# Patient Record
Sex: Male | Born: 1945 | Race: White | Hispanic: No | Smoking: Former smoker
Health system: Southern US, Community
[De-identification: ages and names within clinical notes are randomized; demographics above are authoritative.]

## PROBLEM LIST (undated history)

## (undated) ENCOUNTER — Emergency Department (HOSPITAL_COMMUNITY): Disposition: A | Discharge status: Home / Self Care | Payer: Medicaid Other | Source: Home / Self Care

## (undated) DIAGNOSIS — K7290 Hepatic failure, unspecified without coma: Secondary | ICD-10-CM

## (undated) DIAGNOSIS — I82A11 Acute embolism and thrombosis of right axillary vein: Secondary | ICD-10-CM

## (undated) DIAGNOSIS — I10 Essential (primary) hypertension: Principal | ICD-10-CM

## (undated) DIAGNOSIS — S2242XA Multiple fractures of ribs, left side, initial encounter for closed fracture: Secondary | ICD-10-CM

## (undated) DIAGNOSIS — H8110 Benign paroxysmal vertigo, unspecified ear: Secondary | ICD-10-CM

## (undated) DIAGNOSIS — I441 Atrioventricular block, second degree: Secondary | ICD-10-CM

## (undated) DIAGNOSIS — J449 Chronic obstructive pulmonary disease, unspecified: Secondary | ICD-10-CM

## (undated) DIAGNOSIS — S0292XA Unspecified fracture of facial bones, initial encounter for closed fracture: Secondary | ICD-10-CM

## (undated) DIAGNOSIS — M948X9 Other specified disorders of cartilage, unspecified sites: Secondary | ICD-10-CM

## (undated) DIAGNOSIS — I639 Cerebral infarction, unspecified: Secondary | ICD-10-CM

## (undated) DIAGNOSIS — F0391 Unspecified dementia with behavioral disturbance: Secondary | ICD-10-CM

## (undated) DIAGNOSIS — E1165 Type 2 diabetes mellitus with hyperglycemia: Secondary | ICD-10-CM

## (undated) DIAGNOSIS — Z9181 History of falling: Secondary | ICD-10-CM

## (undated) DIAGNOSIS — R42 Dizziness and giddiness: Secondary | ICD-10-CM

## (undated) DIAGNOSIS — J90 Pleural effusion, not elsewhere classified: Secondary | ICD-10-CM

## (undated) DIAGNOSIS — E785 Hyperlipidemia, unspecified: Secondary | ICD-10-CM

## (undated) DIAGNOSIS — F1011 Alcohol abuse, in remission: Secondary | ICD-10-CM

## (undated) DIAGNOSIS — M545 Low back pain, unspecified: Secondary | ICD-10-CM

## (undated) DIAGNOSIS — R3915 Urgency of urination: Secondary | ICD-10-CM

## (undated) DIAGNOSIS — D696 Thrombocytopenia, unspecified: Secondary | ICD-10-CM

## (undated) DIAGNOSIS — I219 Acute myocardial infarction, unspecified: Secondary | ICD-10-CM

## (undated) DIAGNOSIS — J9601 Acute respiratory failure with hypoxia: Secondary | ICD-10-CM

## (undated) DIAGNOSIS — E872 Acidosis: Secondary | ICD-10-CM

## (undated) DIAGNOSIS — R531 Weakness: Secondary | ICD-10-CM

## (undated) DIAGNOSIS — J189 Pneumonia, unspecified organism: Secondary | ICD-10-CM

## (undated) DIAGNOSIS — Z930 Tracheostomy status: Secondary | ICD-10-CM

## (undated) DIAGNOSIS — E118 Type 2 diabetes mellitus with unspecified complications: Secondary | ICD-10-CM

## (undated) DIAGNOSIS — I469 Cardiac arrest, cause unspecified: Secondary | ICD-10-CM

## (undated) DIAGNOSIS — I4510 Unspecified right bundle-branch block: Secondary | ICD-10-CM

## (undated) DIAGNOSIS — S2249XA Multiple fractures of ribs, unspecified side, initial encounter for closed fracture: Secondary | ICD-10-CM

## (undated) DIAGNOSIS — N189 Chronic kidney disease, unspecified: Secondary | ICD-10-CM

## (undated) DIAGNOSIS — D72829 Elevated white blood cell count, unspecified: Secondary | ICD-10-CM

## (undated) DIAGNOSIS — W19XXXA Unspecified fall, initial encounter: Secondary | ICD-10-CM

## (undated) DIAGNOSIS — J209 Acute bronchitis, unspecified: Secondary | ICD-10-CM

## (undated) DIAGNOSIS — E039 Hypothyroidism, unspecified: Secondary | ICD-10-CM

## (undated) DIAGNOSIS — C801 Malignant (primary) neoplasm, unspecified: Secondary | ICD-10-CM

## (undated) DIAGNOSIS — R1310 Dysphagia, unspecified: Secondary | ICD-10-CM

## (undated) DIAGNOSIS — S069X9A Unspecified intracranial injury with loss of consciousness of unspecified duration, initial encounter: Secondary | ICD-10-CM

## (undated) HISTORY — DX: Multiple fractures of ribs, left side, initial encounter for closed fracture: S22.42XA

## (undated) HISTORY — DX: Cardiac arrest, cause unspecified: I46.9

## (undated) HISTORY — DX: Alcohol abuse, in remission: F10.11

## (undated) HISTORY — DX: Low back pain, unspecified: M54.50

## (undated) HISTORY — DX: Elevated white blood cell count, unspecified: D72.829

## (undated) HISTORY — DX: Type 2 diabetes mellitus with hyperglycemia: E11.65

## (undated) HISTORY — DX: Benign paroxysmal vertigo, unspecified ear: H81.10

## (undated) HISTORY — DX: Multiple fractures of ribs, unspecified side, initial encounter for closed fracture: S22.49XA

## (undated) HISTORY — DX: Chronic obstructive pulmonary disease, unspecified: J44.9

## (undated) HISTORY — DX: Pleural effusion, not elsewhere classified: J90

## (undated) HISTORY — DX: Tracheostomy status: Z93.0

## (undated) HISTORY — DX: Unspecified dementia with behavioral disturbance: F03.91

## (undated) HISTORY — DX: Hepatic failure, unspecified without coma: K72.90

## (undated) HISTORY — DX: Acute embolism and thrombosis of right axillary vein: I82.A11

## (undated) HISTORY — DX: Acute bronchitis, unspecified: J20.9

## (undated) HISTORY — DX: Low back pain: M54.5

## (undated) HISTORY — DX: Unspecified right bundle-branch block: I45.10

## (undated) HISTORY — DX: Acidosis: E87.2

## (undated) HISTORY — DX: Acute respiratory failure with hypoxia: J96.01

## (undated) HISTORY — DX: Atrioventricular block, second degree: I44.1

## (undated) HISTORY — DX: Dizziness and giddiness: R42

## (undated) HISTORY — DX: Type 2 diabetes mellitus with unspecified complications: E11.8

## (undated) HISTORY — DX: Dysphagia, unspecified: R13.10

## (undated) HISTORY — DX: Essential (primary) hypertension: I10

## (undated) HISTORY — DX: Cerebral infarction, unspecified: I63.9

## (undated) HISTORY — DX: Hyperlipidemia, unspecified: E78.5

## (undated) HISTORY — DX: Other specified disorders of cartilage, unspecified sites: M94.8X9

## (undated) HISTORY — DX: Thrombocytopenia, unspecified: D69.6

## (undated) HISTORY — DX: Unspecified fall, initial encounter: W19.XXXA

## (undated) HISTORY — PX: CATARACT EXTRACTION, BILATERAL: SHX1313

---

## 1999-08-12 ENCOUNTER — Encounter: Payer: Self-pay | Admitting: Emergency Medicine

## 1999-08-12 ENCOUNTER — Emergency Department (HOSPITAL_COMMUNITY): Admission: EM | Admit: 1999-08-12 | Discharge: 1999-08-12 | Payer: Self-pay | Admitting: Emergency Medicine

## 1999-11-29 ENCOUNTER — Encounter: Admission: RE | Admit: 1999-11-29 | Discharge: 1999-11-29 | Payer: Self-pay | Admitting: Internal Medicine

## 1999-11-29 ENCOUNTER — Ambulatory Visit (HOSPITAL_COMMUNITY): Admission: RE | Admit: 1999-11-29 | Discharge: 1999-11-29 | Payer: Self-pay | Admitting: Internal Medicine

## 2003-10-03 DIAGNOSIS — I639 Cerebral infarction, unspecified: Secondary | ICD-10-CM

## 2003-10-03 HISTORY — DX: Cerebral infarction, unspecified: I63.9

## 2004-01-16 ENCOUNTER — Inpatient Hospital Stay (HOSPITAL_COMMUNITY): Admission: EM | Admit: 2004-01-16 | Discharge: 2004-01-21 | Payer: Self-pay

## 2004-02-01 ENCOUNTER — Encounter: Admission: RE | Admit: 2004-02-01 | Discharge: 2004-02-01 | Payer: Self-pay | Admitting: Family Medicine

## 2004-04-18 ENCOUNTER — Emergency Department (HOSPITAL_COMMUNITY): Admission: EM | Admit: 2004-04-18 | Discharge: 2004-04-18 | Payer: Self-pay | Admitting: Emergency Medicine

## 2004-09-05 ENCOUNTER — Ambulatory Visit: Payer: Self-pay | Admitting: Internal Medicine

## 2005-02-17 ENCOUNTER — Ambulatory Visit: Payer: Self-pay | Admitting: Family Medicine

## 2005-03-22 ENCOUNTER — Ambulatory Visit: Payer: Self-pay | Admitting: Family Medicine

## 2005-05-15 ENCOUNTER — Ambulatory Visit: Payer: Self-pay | Admitting: Family Medicine

## 2005-07-25 ENCOUNTER — Ambulatory Visit: Payer: Self-pay | Admitting: Family Medicine

## 2005-09-04 ENCOUNTER — Ambulatory Visit: Payer: Self-pay | Admitting: Sports Medicine

## 2005-09-05 ENCOUNTER — Encounter: Admission: RE | Admit: 2005-09-05 | Discharge: 2005-09-05 | Payer: Self-pay | Admitting: Sports Medicine

## 2005-11-03 ENCOUNTER — Ambulatory Visit: Payer: Self-pay | Admitting: Family Medicine

## 2005-12-19 ENCOUNTER — Ambulatory Visit: Payer: Self-pay | Admitting: Family Medicine

## 2005-12-27 ENCOUNTER — Ambulatory Visit: Payer: Self-pay | Admitting: Family Medicine

## 2005-12-29 ENCOUNTER — Encounter: Admission: RE | Admit: 2005-12-29 | Discharge: 2005-12-29 | Payer: Self-pay | Admitting: Family Medicine

## 2006-01-02 ENCOUNTER — Encounter: Admission: RE | Admit: 2006-01-02 | Discharge: 2006-04-02 | Payer: Self-pay | Admitting: Family Medicine

## 2006-01-02 HISTORY — PX: ELECTROCARDIOGRAM: SHX264

## 2006-01-17 ENCOUNTER — Ambulatory Visit: Payer: Self-pay | Admitting: Sports Medicine

## 2006-01-30 HISTORY — PX: OTHER SURGICAL HISTORY: SHX169

## 2006-02-14 ENCOUNTER — Ambulatory Visit: Payer: Self-pay | Admitting: Family Medicine

## 2006-03-26 ENCOUNTER — Emergency Department (HOSPITAL_COMMUNITY): Admission: EM | Admit: 2006-03-26 | Discharge: 2006-03-27 | Payer: Self-pay | Admitting: Emergency Medicine

## 2006-04-16 ENCOUNTER — Encounter: Admission: RE | Admit: 2006-04-16 | Discharge: 2006-04-16 | Payer: Self-pay | Admitting: Sports Medicine

## 2006-05-03 ENCOUNTER — Ambulatory Visit: Payer: Self-pay | Admitting: Family Medicine

## 2006-06-01 ENCOUNTER — Ambulatory Visit: Payer: Self-pay | Admitting: Family Medicine

## 2006-06-21 ENCOUNTER — Ambulatory Visit: Payer: Self-pay | Admitting: Family Medicine

## 2006-06-26 ENCOUNTER — Ambulatory Visit: Payer: Self-pay | Admitting: Family Medicine

## 2006-06-26 HISTORY — PX: OTHER SURGICAL HISTORY: SHX169

## 2006-07-17 ENCOUNTER — Ambulatory Visit: Payer: Self-pay | Admitting: Family Medicine

## 2006-08-15 ENCOUNTER — Ambulatory Visit: Payer: Self-pay | Admitting: Family Medicine

## 2006-10-08 ENCOUNTER — Ambulatory Visit: Payer: Self-pay | Admitting: Family Medicine

## 2006-10-08 ENCOUNTER — Encounter (INDEPENDENT_AMBULATORY_CARE_PROVIDER_SITE_OTHER): Payer: Self-pay | Admitting: Family Medicine

## 2006-10-08 LAB — CONVERTED CEMR LAB
Cholesterol: 136 mg/dL (ref 0–200)
Total CHOL/HDL Ratio: 2.3
Triglycerides: 83 mg/dL (ref <150)
VLDL: 17 mg/dL (ref 0–40)

## 2006-10-11 ENCOUNTER — Encounter: Admission: RE | Admit: 2006-10-11 | Discharge: 2006-10-11 | Payer: Self-pay | Admitting: Sports Medicine

## 2006-10-26 ENCOUNTER — Encounter: Admission: RE | Admit: 2006-10-26 | Discharge: 2006-10-26 | Payer: Self-pay | Admitting: Sports Medicine

## 2006-11-06 ENCOUNTER — Ambulatory Visit: Payer: Self-pay | Admitting: Family Medicine

## 2006-11-19 ENCOUNTER — Encounter: Admission: RE | Admit: 2006-11-19 | Discharge: 2006-11-19 | Payer: Self-pay | Admitting: Sports Medicine

## 2006-12-06 ENCOUNTER — Telehealth (INDEPENDENT_AMBULATORY_CARE_PROVIDER_SITE_OTHER): Payer: Self-pay | Admitting: Family Medicine

## 2006-12-19 ENCOUNTER — Ambulatory Visit (HOSPITAL_COMMUNITY): Admission: RE | Admit: 2006-12-19 | Discharge: 2006-12-19 | Payer: Self-pay | Admitting: Gastroenterology

## 2007-01-02 ENCOUNTER — Encounter: Admission: RE | Admit: 2007-01-02 | Discharge: 2007-01-02 | Payer: Self-pay | Admitting: Sports Medicine

## 2007-01-15 ENCOUNTER — Telehealth: Payer: Self-pay | Admitting: *Deleted

## 2007-05-08 ENCOUNTER — Ambulatory Visit: Payer: Self-pay | Admitting: Family Medicine

## 2007-05-08 DIAGNOSIS — F17200 Nicotine dependence, unspecified, uncomplicated: Secondary | ICD-10-CM

## 2007-05-08 DIAGNOSIS — J449 Chronic obstructive pulmonary disease, unspecified: Secondary | ICD-10-CM

## 2007-05-08 DIAGNOSIS — J4489 Other specified chronic obstructive pulmonary disease: Secondary | ICD-10-CM

## 2007-05-08 DIAGNOSIS — I10 Essential (primary) hypertension: Secondary | ICD-10-CM

## 2007-05-08 HISTORY — DX: Essential (primary) hypertension: I10

## 2007-05-08 HISTORY — DX: Chronic obstructive pulmonary disease, unspecified: J44.9

## 2007-05-08 HISTORY — DX: Other specified chronic obstructive pulmonary disease: J44.89

## 2007-08-09 ENCOUNTER — Telehealth (INDEPENDENT_AMBULATORY_CARE_PROVIDER_SITE_OTHER): Payer: Self-pay | Admitting: Family Medicine

## 2007-08-23 ENCOUNTER — Ambulatory Visit: Payer: Self-pay | Admitting: Family Medicine

## 2007-08-23 DIAGNOSIS — E119 Type 2 diabetes mellitus without complications: Secondary | ICD-10-CM | POA: Insufficient documentation

## 2007-08-26 ENCOUNTER — Encounter (INDEPENDENT_AMBULATORY_CARE_PROVIDER_SITE_OTHER): Payer: Self-pay | Admitting: *Deleted

## 2007-12-04 ENCOUNTER — Ambulatory Visit: Payer: Self-pay | Admitting: Family Medicine

## 2007-12-04 ENCOUNTER — Telehealth: Payer: Self-pay | Admitting: *Deleted

## 2007-12-04 DIAGNOSIS — E785 Hyperlipidemia, unspecified: Secondary | ICD-10-CM

## 2008-01-13 ENCOUNTER — Encounter (INDEPENDENT_AMBULATORY_CARE_PROVIDER_SITE_OTHER): Payer: Self-pay | Admitting: *Deleted

## 2008-01-28 ENCOUNTER — Encounter (INDEPENDENT_AMBULATORY_CARE_PROVIDER_SITE_OTHER): Payer: Self-pay | Admitting: *Deleted

## 2008-02-21 ENCOUNTER — Encounter (INDEPENDENT_AMBULATORY_CARE_PROVIDER_SITE_OTHER): Payer: Self-pay | Admitting: *Deleted

## 2008-03-17 ENCOUNTER — Encounter (INDEPENDENT_AMBULATORY_CARE_PROVIDER_SITE_OTHER): Payer: Self-pay | Admitting: *Deleted

## 2008-03-23 ENCOUNTER — Ambulatory Visit: Payer: Self-pay | Admitting: Family Medicine

## 2008-03-23 ENCOUNTER — Encounter: Payer: Self-pay | Admitting: Family Medicine

## 2008-03-23 ENCOUNTER — Inpatient Hospital Stay (HOSPITAL_COMMUNITY): Admission: EM | Admit: 2008-03-23 | Discharge: 2008-03-27 | Payer: Self-pay | Admitting: Emergency Medicine

## 2008-03-24 ENCOUNTER — Encounter: Payer: Self-pay | Admitting: Family Medicine

## 2008-03-26 ENCOUNTER — Encounter: Payer: Self-pay | Admitting: Family Medicine

## 2008-04-07 ENCOUNTER — Ambulatory Visit: Payer: Self-pay | Admitting: Family Medicine

## 2008-04-07 ENCOUNTER — Encounter: Payer: Self-pay | Admitting: Family Medicine

## 2008-04-07 LAB — CONVERTED CEMR LAB
BUN: 18 mg/dL (ref 6–23)
CO2: 34 meq/L — ABNORMAL HIGH (ref 19–32)
Chloride: 94 meq/L — ABNORMAL LOW (ref 96–112)
Creatinine, Ser: 1.08 mg/dL (ref 0.40–1.50)

## 2008-04-09 ENCOUNTER — Telehealth: Payer: Self-pay | Admitting: Family Medicine

## 2008-05-04 ENCOUNTER — Encounter: Payer: Self-pay | Admitting: *Deleted

## 2008-05-04 ENCOUNTER — Encounter: Payer: Self-pay | Admitting: Family Medicine

## 2008-05-26 ENCOUNTER — Ambulatory Visit: Payer: Self-pay | Admitting: Family Medicine

## 2008-05-27 ENCOUNTER — Telehealth: Payer: Self-pay | Admitting: *Deleted

## 2008-06-10 ENCOUNTER — Encounter: Payer: Self-pay | Admitting: Family Medicine

## 2008-08-10 ENCOUNTER — Ambulatory Visit: Payer: Self-pay | Admitting: Family Medicine

## 2008-08-10 DIAGNOSIS — H8110 Benign paroxysmal vertigo, unspecified ear: Secondary | ICD-10-CM

## 2008-08-10 HISTORY — DX: Benign paroxysmal vertigo, unspecified ear: H81.10

## 2008-09-09 ENCOUNTER — Encounter: Payer: Self-pay | Admitting: Family Medicine

## 2008-11-02 ENCOUNTER — Encounter (INDEPENDENT_AMBULATORY_CARE_PROVIDER_SITE_OTHER): Payer: Self-pay | Admitting: *Deleted

## 2008-11-09 ENCOUNTER — Telehealth: Payer: Self-pay | Admitting: Family Medicine

## 2008-12-07 ENCOUNTER — Ambulatory Visit: Payer: Self-pay | Admitting: Family Medicine

## 2009-03-18 ENCOUNTER — Encounter: Payer: Self-pay | Admitting: Family Medicine

## 2009-03-18 ENCOUNTER — Ambulatory Visit: Payer: Self-pay | Admitting: Family Medicine

## 2009-03-18 LAB — CONVERTED CEMR LAB
BUN: 18 mg/dL (ref 6–23)
CO2: 26 meq/L (ref 19–32)
Calcium: 9.4 mg/dL (ref 8.4–10.5)
Creatinine, Ser: 1.27 mg/dL (ref 0.40–1.50)
Glucose, Bld: 84 mg/dL (ref 70–99)

## 2009-03-19 ENCOUNTER — Telehealth: Payer: Self-pay | Admitting: Family Medicine

## 2009-05-04 ENCOUNTER — Encounter: Payer: Self-pay | Admitting: Family Medicine

## 2009-07-09 ENCOUNTER — Ambulatory Visit: Payer: Self-pay | Admitting: Family Medicine

## 2009-07-22 ENCOUNTER — Telehealth: Payer: Self-pay | Admitting: *Deleted

## 2009-07-23 ENCOUNTER — Ambulatory Visit: Payer: Self-pay | Admitting: Family Medicine

## 2009-08-09 ENCOUNTER — Telehealth: Payer: Self-pay | Admitting: *Deleted

## 2009-09-09 ENCOUNTER — Ambulatory Visit: Payer: Self-pay | Admitting: Family Medicine

## 2009-09-09 ENCOUNTER — Encounter: Payer: Self-pay | Admitting: Family Medicine

## 2009-09-09 LAB — CONVERTED CEMR LAB
AST: 20 units/L (ref 0–37)
Alkaline Phosphatase: 66 units/L (ref 39–117)
BUN: 17 mg/dL (ref 6–23)
Calcium: 9.5 mg/dL (ref 8.4–10.5)
Creatinine, Ser: 1.36 mg/dL (ref 0.40–1.50)

## 2009-09-13 ENCOUNTER — Encounter: Payer: Self-pay | Admitting: Family Medicine

## 2009-09-15 ENCOUNTER — Encounter: Admission: RE | Admit: 2009-09-15 | Discharge: 2009-09-15 | Payer: Self-pay | Admitting: Anesthesiology

## 2009-09-21 ENCOUNTER — Encounter: Payer: Self-pay | Admitting: Family Medicine

## 2009-09-23 ENCOUNTER — Encounter: Admission: RE | Admit: 2009-09-23 | Discharge: 2009-09-23 | Payer: Self-pay | Admitting: Anesthesiology

## 2009-10-18 ENCOUNTER — Telehealth: Payer: Self-pay | Admitting: Family Medicine

## 2009-10-21 ENCOUNTER — Ambulatory Visit: Payer: Self-pay | Admitting: Family Medicine

## 2009-12-16 ENCOUNTER — Ambulatory Visit: Payer: Self-pay | Admitting: Family Medicine

## 2009-12-16 DIAGNOSIS — G4700 Insomnia, unspecified: Secondary | ICD-10-CM | POA: Insufficient documentation

## 2010-01-05 ENCOUNTER — Ambulatory Visit: Payer: Self-pay | Admitting: Cardiology

## 2010-01-19 ENCOUNTER — Ambulatory Visit: Payer: Self-pay | Admitting: Family Medicine

## 2010-01-27 ENCOUNTER — Telehealth (INDEPENDENT_AMBULATORY_CARE_PROVIDER_SITE_OTHER): Payer: Self-pay | Admitting: *Deleted

## 2010-01-30 HISTORY — PX: TRANSTHORACIC ECHOCARDIOGRAM: SHX275

## 2010-01-30 HISTORY — PX: NM MYOVIEW LTD: HXRAD82

## 2010-01-31 ENCOUNTER — Ambulatory Visit: Payer: Self-pay

## 2010-01-31 ENCOUNTER — Encounter: Payer: Self-pay | Admitting: Cardiology

## 2010-01-31 ENCOUNTER — Encounter (HOSPITAL_COMMUNITY): Admission: RE | Admit: 2010-01-31 | Discharge: 2010-04-06 | Payer: Self-pay | Admitting: Cardiology

## 2010-01-31 ENCOUNTER — Ambulatory Visit (HOSPITAL_COMMUNITY): Admission: RE | Admit: 2010-01-31 | Discharge: 2010-01-31 | Payer: Self-pay | Admitting: Cardiology

## 2010-01-31 ENCOUNTER — Ambulatory Visit: Payer: Self-pay | Admitting: Cardiology

## 2010-02-03 ENCOUNTER — Ambulatory Visit: Payer: Self-pay | Admitting: Cardiology

## 2010-02-03 DIAGNOSIS — I4510 Unspecified right bundle-branch block: Secondary | ICD-10-CM

## 2010-02-03 HISTORY — DX: Unspecified right bundle-branch block: I45.10

## 2010-02-15 ENCOUNTER — Ambulatory Visit: Payer: Self-pay | Admitting: Cardiology

## 2010-03-17 ENCOUNTER — Telehealth: Payer: Self-pay | Admitting: Family Medicine

## 2010-03-24 ENCOUNTER — Encounter: Payer: Self-pay | Admitting: Family Medicine

## 2010-04-05 ENCOUNTER — Ambulatory Visit: Payer: Self-pay | Admitting: Family Medicine

## 2010-04-05 ENCOUNTER — Encounter: Payer: Self-pay | Admitting: Family Medicine

## 2010-04-06 ENCOUNTER — Encounter: Payer: Self-pay | Admitting: Family Medicine

## 2010-04-06 LAB — CONVERTED CEMR LAB
CO2: 25 meq/L (ref 19–32)
Calcium: 9 mg/dL (ref 8.4–10.5)
Glucose, Bld: 81 mg/dL (ref 70–99)
HCT: 46.4 % (ref 39.0–52.0)
MCV: 95.9 fL (ref 78.0–100.0)
RBC: 4.84 M/uL (ref 4.22–5.81)
Sodium: 141 meq/L (ref 135–145)
WBC: 5.1 10*3/uL (ref 4.0–10.5)

## 2010-04-13 ENCOUNTER — Ambulatory Visit: Payer: Self-pay | Admitting: Family Medicine

## 2010-04-20 ENCOUNTER — Ambulatory Visit (HOSPITAL_COMMUNITY): Admission: RE | Admit: 2010-04-20 | Discharge: 2010-04-20 | Payer: Self-pay | Admitting: Family Medicine

## 2010-04-20 ENCOUNTER — Encounter: Payer: Self-pay | Admitting: Family Medicine

## 2010-04-20 ENCOUNTER — Ambulatory Visit: Payer: Self-pay | Admitting: Vascular Surgery

## 2010-05-03 ENCOUNTER — Ambulatory Visit: Payer: Self-pay | Admitting: Family Medicine

## 2010-05-09 ENCOUNTER — Telehealth: Payer: Self-pay | Admitting: Family Medicine

## 2010-07-05 ENCOUNTER — Telehealth: Payer: Self-pay | Admitting: Family Medicine

## 2010-08-12 ENCOUNTER — Encounter: Payer: Self-pay | Admitting: Family Medicine

## 2010-08-17 ENCOUNTER — Ambulatory Visit: Payer: Self-pay | Admitting: Family Medicine

## 2010-08-17 DIAGNOSIS — M948X9 Other specified disorders of cartilage, unspecified sites: Secondary | ICD-10-CM

## 2010-08-17 HISTORY — DX: Other specified disorders of cartilage, unspecified sites: M94.8X9

## 2010-08-17 LAB — CONVERTED CEMR LAB: Hgb A1c MFr Bld: 6.2 %

## 2010-08-22 ENCOUNTER — Encounter: Payer: Self-pay | Admitting: Family Medicine

## 2010-08-22 ENCOUNTER — Ambulatory Visit: Payer: Self-pay | Admitting: Family Medicine

## 2010-08-22 LAB — CONVERTED CEMR LAB
Albumin: 4.3 g/dL (ref 3.5–5.2)
BUN: 22 mg/dL (ref 6–23)
CO2: 30 meq/L (ref 19–32)
Calcium: 9.1 mg/dL (ref 8.4–10.5)
Chloride: 94 meq/L — ABNORMAL LOW (ref 96–112)
Creatinine, Ser: 1.07 mg/dL (ref 0.40–1.50)
Glucose, Bld: 110 mg/dL — ABNORMAL HIGH (ref 70–99)
HCT: 48.4 % (ref 39.0–52.0)
HDL: 36 mg/dL — ABNORMAL LOW (ref >39)
Hemoglobin: 15.2 g/dL (ref 13.0–17.0)
LDL Cholesterol: 65 mg/dL (ref 0–99)
Potassium: 4.8 meq/L (ref 3.5–5.3)
RBC: 5.43 M/uL (ref 4.22–5.81)
RDW: 15.3 % (ref 11.5–15.5)
Total CHOL/HDL Ratio: 3.3
Triglycerides: 93 mg/dL (ref <150)
Vit D, 25-Hydroxy: <10 ng/mL — ABNORMAL LOW (ref 30–89)
WBC: 6.4 10*3/uL (ref 4.0–10.5)

## 2010-10-04 ENCOUNTER — Encounter: Payer: Self-pay | Admitting: Family Medicine

## 2010-10-23 ENCOUNTER — Encounter: Payer: Self-pay | Admitting: Surgery

## 2010-10-23 ENCOUNTER — Encounter: Payer: Self-pay | Admitting: Sports Medicine

## 2010-11-03 NOTE — Miscellaneous (Signed)
Summary: PATIENT SUMMARY  Clinical Lists Changes  Problems: Removed problem of UNSPECIFIED BUNDLE BRANCH BLOCK (ICD-426.50) Assessed DIABETES MELLITUS, TYPE II, WITHOUT COMPLICATIONS as comment only - Under excellent control on current regimen.  His updated medication list for this problem includes:    Actos 30 Mg Tabs (Pioglitazone hcl) .Marland Kitchen... Take 1 tablet by mouth once a day    Metformin Hcl 1000 Mg Tabs (Metformin hcl) .Marland Kitchen... Take 1 tablet by mouth twice a day    Enalapril-hydrochlorothiazide 10-25 Mg Tabs (Enalapril-hydrochlorothiazide) .Marland Kitchen... 1 tab by mouth daily    Aspir-low 81 Mg Tbec (Aspirin) ..... One tablet daily  Assessed HYPERTENSION, BENIGN ESSENTIAL, CONTROLLED as comment only - Some trouble recently - had to increase med and then he didn't tolerate it.  Added HCTZ instead.  So far seems to be tolerating this fine.  His updated medication list for this problem includes:    Enalapril-hydrochlorothiazide 10-25 Mg Tabs (Enalapril-hydrochlorothiazide) .Marland Kitchen... 1 tab by mouth daily  Assessed HYPERLIPIDEMIA, CONTROLLED as comment only - Rarely comes in fasting.  Had LDL checked 12/10 and was at goal.  His updated medication list for this problem includes:    Zocor 40 Mg Tabs (Simvastatin) .Marland Kitchen... Take 1 tablet by mouth at bedtime  Assessed COPD as comment only - STable but does use O2 at night.  His updated medication list for this problem includes:    Advair Diskus 500-50 Mcg/dose Misc (Fluticasone-salmeterol) ..... One puff twice daily.    Ventolin Hfa 108 (90 Base) Mcg/act Aers (Albuterol sulfate) .Marland Kitchen... 2-4 puffs every 4 hours as needed for wheezing and shortness of breath.    Spiriva Handihaler 18 Mcg Caps (Tiotropium bromide monohydrate) .Marland Kitchen... 1 cap inhaled daily  Assessed RBBB as comment only - Follows with cardiology.  Had normal ECHO this Spring. Observations: Added new observation of COLONNXTDUE: 12/18/2016 (03/24/2010 8:27) Added new observation of COLONOSCOPY: Normal  (12/19/2006 8:27)      Impression & Recommendations:  Problem # 1:  DIABETES MELLITUS, TYPE II, WITHOUT COMPLICATIONS (ICD-250.00) Under excellent control on current regimen.  His updated medication list for this problem includes:    Actos 30 Mg Tabs (Pioglitazone hcl) .Marland Kitchen... Take 1 tablet by mouth once a day    Metformin Hcl 1000 Mg Tabs (Metformin hcl) .Marland Kitchen... Take 1 tablet by mouth twice a day    Enalapril-hydrochlorothiazide 10-25 Mg Tabs (Enalapril-hydrochlorothiazide) .Marland Kitchen... 1 tab by mouth daily    Aspir-low 81 Mg Tbec (Aspirin) ..... One tablet daily  Problem # 2:  HYPERTENSION, BENIGN ESSENTIAL, CONTROLLED (ICD-401.1) Some trouble recently - had to increase med and then he didn't tolerate it.  Added HCTZ instead.  So far seems to be tolerating this fine.  His updated medication list for this problem includes:    Enalapril-hydrochlorothiazide 10-25 Mg Tabs (Enalapril-hydrochlorothiazide) .Marland Kitchen... 1 tab by mouth daily  Problem # 3:  HYPERLIPIDEMIA, CONTROLLED (ICD-272.4) Rarely comes in fasting.  Had LDL checked 12/10 and was at goal.  His updated medication list for this problem includes:    Zocor 40 Mg Tabs (Simvastatin) .Marland Kitchen... Take 1 tablet by mouth at bedtime  Problem # 4:  COPD (ICD-496) STable but does use O2 at night.  His updated medication list for this problem includes:    Advair Diskus 500-50 Mcg/dose Misc (Fluticasone-salmeterol) ..... One puff twice daily.    Ventolin Hfa 108 (90 Base) Mcg/act Aers (Albuterol sulfate) .Marland Kitchen... 2-4 puffs every 4 hours as needed for wheezing and shortness of breath.    Spiriva Handihaler 18 Mcg Caps (  Tiotropium bromide monohydrate) .Marland Kitchen... 1 cap inhaled daily  Problem # 5:  RBBB (ICD-426.4) Follows with cardiology.  Had normal ECHO this Spring.   Complete Medication List: 1)  Actos 30 Mg Tabs (Pioglitazone hcl) .... Take 1 tablet by mouth once a day 2)  Metformin Hcl 1000 Mg Tabs (Metformin hcl) .... Take 1 tablet by mouth twice a day 3)   Zocor 40 Mg Tabs (Simvastatin) .... Take 1 tablet by mouth at bedtime 4)  Advair Diskus 500-50 Mcg/dose Misc (Fluticasone-salmeterol) .... One puff twice daily. 5)  Enalapril-hydrochlorothiazide 10-25 Mg Tabs (Enalapril-hydrochlorothiazide) .Marland Kitchen.. 1 tab by mouth daily 6)  Ventolin Hfa 108 (90 Base) Mcg/act Aers (Albuterol sulfate) .... 2-4 puffs every 4 hours as needed for wheezing and shortness of breath. 7)  Diazepam 10 Mg Tabs (Diazepam) .Marland Kitchen.. 1 tab by mouth qhs as needed sleep 8)  Loratadine 10 Mg Tabs (Loratadine) .Marland Kitchen.. 1 tab by mouth daily 9)  O2  .... At night and in summer 10)  Aspir-low 49 Mg Tbec (Aspirin) .... One tablet daily 11)  Spiriva Handihaler 18 Mcg Caps (Tiotropium bromide monohydrate) .Marland Kitchen.. 1 cap inhaled daily     Prevention & Chronic Care Immunizations   Influenza vaccine: Fluvax 3+  (07/09/2009)    Tetanus booster: 05/07/2005: Done.   Tetanus booster due: 05/08/2015    Pneumococcal vaccine: Pneumovax  (05/26/2008)    H. zoster vaccine: Not documented  Colorectal Screening   Hemoccult: Done.  (01/06/2006)   Hemoccult due: 01/07/2007    Colonoscopy: Normal  (12/19/2006)   Colonoscopy due: 12/18/2016  Other Screening   PSA: Not documented   Smoking status: current  (01/19/2010)   Smoking cessation counseling: yes  (12/07/2008)  Diabetes Mellitus   HgbA1C: 5.5  (09/09/2009)   Hemoglobin A1C due: 03/05/2008    Eye exam: Not documented    Foot exam: yes  (03/18/2009)   High risk foot: Not documented   Foot care education: Not documented   Foot exam due: 12/03/2008    Urine microalbumin/creatinine ratio: Not documented   Urine microalbumin/cr due: 05/07/2008  Lipids   Total Cholesterol: 136  (10/08/2006)   LDL: 61  (10/08/2006)   LDL Direct: 96  (09/09/2009)   HDL: 58  (10/08/2006)   Triglycerides: 83  (10/08/2006)    SGOT (AST): 20  (09/09/2009)   SGPT (ALT): 12  (09/09/2009)   Alkaline phosphatase: 66  (09/09/2009)   Total bilirubin: 0.3   (09/09/2009)  Hypertension   Last Blood Pressure: 116 / 64  (02/03/2010)   Serum creatinine: 1.36  (09/09/2009)   Serum potassium 4.9  (09/09/2009)  Self-Management Support :   Personal Goals (by the next clinic visit) :     Personal A1C goal: 7  (07/09/2009)     Personal blood pressure goal: 130/80  (07/09/2009)     Personal LDL goal: 100  (07/09/2009)    Diabetes self-management support: Not documented    Hypertension self-management support: Not documented    Hypertension self-management support not done because: Not indicated  (09/09/2009)    Lipid self-management support: Not documented
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
## 2010-11-03 NOTE — Assessment & Plan Note (Signed)
Summary: F/U/KH   Vital Signs:  Patient profile:   65 year old male Weight:      268 pounds BMI:     38.59 Temp:     98.0 degrees F oral Pulse rate:   78 / minute BP sitting:   123 / 66  (left arm) Cuff size:   large  Vitals Entered By: Jimmy Footman, CMA (April 13, 2010 10:49 AM) CC: f/u on  Is Patient Diabetic? Yes Did you bring your meter with you today? No   Primary Care Provider:  Seleena Reimers MD  CC:  f/u on .  History of Present Illness:   Lightheaded: present for over 1 year.  when he stands.  when he wakes.  Describes it as "woozy"  HYPERTENSION Disease Monitoring Blood pressure range: his cuff does not work.  Medications Compliance: Lightheadedness:yes   Edema:  some  Chest pain:no   Dyspnea: with exertion  Prevention Exercise: no much  Salt restriction: yes   Habits & Providers  Alcohol-Tobacco-Diet     Tobacco Status: current  Allergies: 1)  ! Pcn  Past History:  Family History: Last updated: 01/05/2010 Diabetes 1st degree, heart disease`, hypertension  Sister with MI at 33, father with MI at 71  Social History: Last updated: 04/05/2010 On SS disability.  Used to be a Surveyor, [employer].  Lives alone in a house with a dog.  1 ppd smoker (reg. Flavor), occ.  EtOH (unit 12/2009 drank 2-3 beers per day) No drugs.  Risk Factors: Exercise: yes (05/08/2007)  Risk Factors: Smoking Status: current (04/13/2010) Packs/Day: 1.0 (04/05/2010)  Past Medical History: 1. 6/07-pelvic fracture, 2. 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  3. HYPERLIPIDEMIA (ICD-272.4) 4. DIABETES MELLITUS, TYPE II (ICD-250.00) 5. COPD (ICD-496): Active smoker.  6. HYPERTENSION, BENIGN ESSENTIAL 7. Echo (5/11): EF 60%, mild LVH, mild RV dilation with normal systolic function, PA systolic pressure 33 mmHg.  8. Low back pain: goes to a pain clinic.  9. Lexiscan-myoview (5/11): EF 67%, no ischemia or infarction.  10. O2 use 2L for sleep  Past Surgical  History: Bialteral catracts signs 11/2008 ABI = 0.93 (normal) 06/26/06  06/26/2006, anteriolisthesis  01/02/2006, EKG - 1st degree block  12/19/2005, L4-L5 withearly grade 1  01/02/2006, Lipid 10/08/06 TC=136, TG=83, HDL=58, LDL=61 -  10/09/2006, Lumbar x-ray: DDD and facet dz at -  01/02/2006, remove  BCC - 11/13/2005   Impression & Recommendations:  Problem # 1:  HYPERTENSION, BENIGN ESSENTIAL, CONTROLLED (ICD-401.1)  His updated medication list for this problem includes:    Enalapril-hydrochlorothiazide 5-12.5 Mg Tabs (Enalapril-hydrochlorothiazide) .Marland Kitchen... 1 tab by mouth daily (new dose)  Orders: [facility]- Est Level  3 (04540)  Problem # 2:  DIZZINESS (ICD-780.4)  His updated medication list for this problem includes:    Loratadine 10 Mg Tabs (Loratadine) .Marland Kitchen... 1 tab by mouth daily  Orders: Carotid Doppler (Carotid doppler) [facility]- Est Level  3 (98119)  Complete Medication List: 1)  Actos 30 Mg Tabs (Pioglitazone hcl) .... Take 1 tablet by mouth once a day 2)  Metformin Hcl 1000 Mg Tabs (Metformin hcl) .... Take 1 tablet by mouth twice a day 3)  Zocor 40 Mg Tabs (Simvastatin) .... Take 1 tablet by mouth at bedtime 4)  Advair Diskus 500-50 Mcg/dose Misc (Fluticasone-salmeterol) .... One puff twice daily. 5)  Enalapril-hydrochlorothiazide 5-12.5 Mg Tabs (Enalapril-hydrochlorothiazide) .Marland Kitchen.. 1 tab by mouth daily (new dose) 6)  Ventolin Hfa 108 (90 Base) Mcg/act Aers (Albuterol sulfate) .... 2-4 puffs every 4  hours as needed for wheezing and shortness of breath. 7)  Diazepam 10 Mg Tabs (Diazepam) .Marland Kitchen.. 1 tab by mouth qhs as needed sleep 8)  Loratadine 10 Mg Tabs (Loratadine) .Marland Kitchen.. 1 tab by mouth daily 9)  O2  .... At night and in summer 10)  Aspir-low 70 Mg Tbec (Aspirin) .... One tablet daily 11)  Spiriva Handihaler 18 Mcg Caps (Tiotropium bromide monohydrate) .Marland Kitchen.. 1 cap inhaled daily  Patient Instructions: 1)  We will schedule to get an ultrasound of your neck.  2)  Appointment is  _______________________ 3)  Please schedule an appointment with Dr Janalyn Harder  one week after this ultrasound.

## 2010-11-03 NOTE — Progress Notes (Signed)
Summary: meds prob  Phone Note Call from Patient Call back at Home Phone [phone]   Caller: Patient Summary of Call: pt started taking 20mg  Enalipril and it "knocked him out"  walked around like he was drunk and didn't like the side affects.  pls advise Initial call taken by: De Nurse,  October 18, 2009 10:30 AM  Follow-up for Phone Call        uses [facility]. states his pressure is too low with the 20 mg. wants to either go back to 10mg  or 15mg . did not want to come in. told him I will call him with md response Follow-up by: Golden Circle RN,  October 18, 2009 10:42 AM  Additional Follow-up for Phone Call Additional follow up Details #1::        How many days did he try it?  Just one or two or more?  Side effects can often improve after a few days.  Also, did he check his BP when he felt like that?  Would like to know if it truly was low or not before changing med.  Depending on response, can just lower dose back down  or can lower dose and add something different, like HCTZ. Additional Follow-up by: Lamar Laundry, MD January 18th, 2010 08:45AM    Additional Follow-up for Phone Call Additional follow up Details #2::    he took 28 pills so far. pressures are 177/48, 190/62. asked him to come see md thursday. need to check bp here & discuss with md. he agreed Follow-up by: Golden Circle RN,  October 19, 2009 10:15 AM

## 2010-11-03 NOTE — Progress Notes (Signed)
Summary: Nuclear Pre-Procedure  Phone Note Outgoing Call   Call placed by: Milana Na, EMT-P,  January 27, 2010 12:48 PM Summary of Call: Reviewed information on Myoview Information Sheet (see scanned document for further details).  Spoke with patient.     Nuclear Med Background Indications for Stress Test: Evaluation for Ischemia, Abnormal EKG   History: COPD, Echo  History Comments: 06/09 ECHO EF 60%, mod. LVH mod. 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

## 2010-11-03 NOTE — Miscellaneous (Signed)
Summary: Changing prob list   Clinical Lists Changes  Problems: Removed problem of THYROID STIMULATING HORMONE, ABNORMAL (ICD-246.9) Removed problem of DYSPNEA ON EXERTION (ICD-786.09) Removed problem of UMBILICAL HERNIA (ICD-553.1) Removed problem of DIZZINESS (ICD-780.4) Removed problem of ALLERGIC RHINITIS, SEASONAL (ICD-477.0)

## 2010-11-03 NOTE — Assessment & Plan Note (Signed)
Summary: fu/kh   Vital Signs:  Patient profile:   65 year old male Height:      70 inches Weight:      264 pounds BMI:     38.02 Temp:     97.8 degrees F oral BP sitting:   118 / 69  (left arm) Cuff size:   large  Vitals Entered By: Tessie Fass CMA (January 19, 2010 2:26 PM) CC: F/U BP Is Patient Diabetic? Yes Pain Assessment Patient in pain? no        Primary Care Provider:  Ardeen Garland  MD  CC:  F/U BP.  History of Present Illness: Alex Foster comes in today for a recheck of his blood pressure.  He did recently hvae his cardiology referral. 1) BP - added HCTZ to his enalapril at last visit.  Now at goal.  Was on the enalapril/hctz at cardiology ppt 2 weeks ago and BP was elevated.  However very good today.  Tolerating the medicine - no cramping, dizziness, lightheadedness.   2) Cards - planning to do stress test, echo, and holter monitor.  Did note in their note that he was not on spiriva for his COPD.  He had taken before and didn't thik it worked and so stopped it.  Talking to him today, he is willing to try it again.   Habits & Providers  Alcohol-Tobacco-Diet     Tobacco Status: current     Tobacco Counseling: to quit use of tobacco products     Cigarette Packs/Day: 1.5  Current Medications (verified): 1)  Actos 30 Mg Tabs (Pioglitazone Hcl) .... Take 1 Tablet By Mouth Once A Day 2)  Metformin Hcl 1000 Mg Tabs (Metformin Hcl) .... Take 1 Tablet By Mouth Twice A Day 3)  Zocor 40 Mg Tabs (Simvastatin) .... Take 1 Tablet By Mouth At Bedtime 4)  Advair Diskus 500-50 Mcg/dose  Misc (Fluticasone-Salmeterol) .... One Puff Twice Daily. 5)  Enalapril-Hydrochlorothiazide 10-25 Mg Tabs (Enalapril-Hydrochlorothiazide) .Marland Kitchen.. 1 Tab By Mouth Daily 6)  Ventolin Hfa 108 (90 Base) Mcg/act  Aers (Albuterol Sulfate) .... 2-4 Puffs Every 4 Hours As Needed For Wheezing and Shortness of Breath. 7)  Diazepam 10 Mg Tabs (Diazepam) .Marland Kitchen.. 1 Tab By Mouth Qhs As Needed Sleep 8)  Loratadine 10  Mg Tabs (Loratadine) .Marland Kitchen.. 1 Tab By Mouth Daily 9)  O2 .... At Night and in Summer 10)  Aspir-Low 81 Mg Tbec (Aspirin) .... One Tablet Daily 11)  Spiriva Handihaler 18 Mcg Caps (Tiotropium Bromide Monohydrate) .Marland Kitchen.. 1 Cap Inhaled Daily  Allergies: 1)  ! Pcn  Social History: Packs/Day:  1.5  Physical Exam  General:  obese, alert, NAD.  Vital reviewed. Lungs:  Normal respiratory effort, chest expands symmetrically. Lungs are clear to auscultation, no crackles or wheezes. Heart:  Normal rate and regular rhythm. S1 and S2 normal without gallop, murmur, click, rub or other extra sounds.   Impression & Recommendations:  Problem # 1:  HYPERTENSION, BENIGN ESSENTIAL, CONTROLLED (ICD-401.1) Assessment Improved  At goal on current regimen His updated medication list for this problem includes:    Enalapril-hydrochlorothiazide 10-25 Mg Tabs (Enalapril-hydrochlorothiazide) .Marland Kitchen... 1 tab by mouth daily  Orders: [facility]- Est Level  3 (30865)  Problem # 2:  COPD (ICD-496) Assessment: Unchanged  REstart spiriva. His updated medication list for this problem includes:    Advair Diskus 500-50 Mcg/dose Misc (Fluticasone-salmeterol) ..... One puff twice daily.    Ventolin Hfa 108 (90 Base) Mcg/act Aers (Albuterol sulfate) .Marland Kitchen... 2-4 puffs every 4  hours as needed for wheezing and shortness of breath.    Spiriva Handihaler 18 Mcg Caps (Tiotropium bromide monohydrate) .Marland Kitchen... 1 cap inhaled daily  Orders: [facility]- Est Level  3 (16109)  Complete Medication List: 1)  Actos 30 Mg Tabs (Pioglitazone hcl) .... Take 1 tablet by mouth once a day 2)  Metformin Hcl 1000 Mg Tabs (Metformin hcl) .... Take 1 tablet by mouth twice a day 3)  Zocor 40 Mg Tabs (Simvastatin) .... Take 1 tablet by mouth at bedtime 4)  Advair Diskus 500-50 Mcg/dose Misc (Fluticasone-salmeterol) .... One puff twice daily. 5)  Enalapril-hydrochlorothiazide 10-25 Mg Tabs (Enalapril-hydrochlorothiazide) .Marland Kitchen.. 1 tab by mouth daily 6)  Ventolin Hfa  108 (90 Base) Mcg/act Aers (Albuterol sulfate) .... 2-4 puffs every 4 hours as needed for wheezing and shortness of breath. 7)  Diazepam 10 Mg Tabs (Diazepam) .Marland Kitchen.. 1 tab by mouth qhs as needed sleep 8)  Loratadine 10 Mg Tabs (Loratadine) .Marland Kitchen.. 1 tab by mouth daily 9)  O2  .... At night and in summer 10)  Aspir-low 82 Mg Tbec (Aspirin) .... One tablet daily 11)  Spiriva Handihaler 18 Mcg Caps (Tiotropium bromide monohydrate) .Marland Kitchen.. 1 cap inhaled daily  Patient Instructions: 1)  Your blood pressure is great today!  Keep taking yoru current medication. 2)  Please start the spiriva again to see if it helps your breathing. 3)  Return in 3 months.  Prescriptions: SPIRIVA HANDIHALER 18 MCG CAPS (TIOTROPIUM BROMIDE MONOHYDRATE) 1 cap inhaled daily  #30 x 11   Entered and Authorized by:   Ardeen Garland  MD   Signed by:   Ardeen Garland  MD on 01/19/2010   Method used:   Print then Give to Patient   RxID:   [id number]   Prevention & Chronic Care Immunizations   Influenza vaccine: Fluvax 3+  (07/09/2009)    Tetanus booster: 05/07/2005: Done.   Tetanus booster due: 05/08/2015    Pneumococcal vaccine: Pneumovax  (05/26/2008)    H. zoster vaccine: Not documented  Colorectal Screening   Hemoccult: Done.  (01/06/2006)   Hemoccult due: 01/07/2007    Colonoscopy: Not documented  Other Screening   PSA: Not documented   Smoking status: current  (01/19/2010)   Smoking cessation counseling: yes  (12/07/2008)  Diabetes Mellitus   HgbA1C: 5.5  (09/09/2009)   Hemoglobin A1C due: 03/05/2008    Eye exam: Not documented    Foot exam: yes  (03/18/2009)   High risk foot: Not documented   Foot care education: Not documented   Foot exam due: 12/03/2008    Urine microalbumin/creatinine ratio: Not documented   Urine microalbumin/cr due: 05/07/2008  Lipids   Total Cholesterol: 136  (10/08/2006)   LDL: 61  (10/08/2006)   LDL Direct: 96  (09/09/2009)   HDL: 58  (10/08/2006)   Triglycerides:  83  (10/08/2006)    SGOT (AST): 20  (09/09/2009)   SGPT (ALT): 12  (09/09/2009)   Alkaline phosphatase: 66  (09/09/2009)   Total bilirubin: 0.3  (09/09/2009)  Hypertension   Last Blood Pressure: 118 / 69  (01/19/2010)   Serum creatinine: 1.36  (09/09/2009)   Serum potassium 4.9  (09/09/2009)    Hypertension flowsheet reviewed?: Yes   Progress toward BP goal: At goal  Self-Management Support :   Personal Goals (by the next clinic visit) :     Personal A1C goal: 7  (07/09/2009)     Personal blood pressure goal: 130/80  (07/09/2009)     Personal LDL goal: 100  (  07/09/2009)    Diabetes self-management support: Not documented    Hypertension self-management support: Not documented    Hypertension self-management support not done because: Not indicated  (09/09/2009)    Lipid self-management support: Not documented

## 2010-11-03 NOTE — Procedures (Signed)
Summary: summary report  summary report   Imported By: Mirna Mires 03/02/2010 09:32:41  _____________________________________________________________________  External Attachment:    Type:   Image     Comment:   External Document

## 2010-11-03 NOTE — Assessment & Plan Note (Signed)
Summary: meet dr. general ck up,tcb   Vital Signs:  Patient profile:   65 year old male Height:      70 inches Weight:      272 pounds BMI:     39.17 Temp:     97.8 degrees F oral Pulse rate:   72 / minute Pulse (ortho):   66 / minute BP sitting:   115 / 63  (right arm) BP standing:   104 / 60 Cuff size:   regular  Vitals Entered By: Tessie Fass CMA (April 05, 2010 10:01 AM)  Serial Vital Signs/Assessments:  Time      Position  BP       Pulse  Resp  Temp     By 10:43 AM  Lying LA  110/63   63                    Thekla Slade CMA, 10:43 AM  Sitting   106/60   66                    Thekla Slade CMA, 10:43 AM  Standing  104/60   66                    Thekla Slade CMA,  CC: F/U BP and Diabetes Is Patient Diabetic? Yes Pain Assessment Patient in pain? yes     Location: lower back Intensity: 8   Primary Care Provider:  Lorely Bubb MD  CC:  F/U BP and Diabetes.  History of Present Illness: 65 y/o M here for f/u  HYPERTENSION Disease Monitoring Blood pressure range: checked at home SBPs 134-135 Medications: enalapri 10//hctz 25 mg daily Compliance: yes Lightheadedness: sometimes  Edema: "little bit" Chest pain: no  Dyspnea:"always short of breath from smoking"  Prevention Exercise: walks  once in a while Smokes 1 ppd   DIABETES Meds: metformin 1000mg  two times a day, actos 30md daily Compliance: yes    Diet: used to drink 2-3 beers/day Lightheadedness: sometimes    Dizziness: sometimes    Confusion: no    Shakines: no    Abd  pain: no   Nausea:no    Vomiting: no     CBGs: fasting 90-120  "Instability": For over one year,  When he turns his head quickly he feels unstable, like he is "drunk".  Also Occurs when he is walking and turns the corner.  He has not fallen, but feels like he might fall.  He would loook for a wall nearby to steday himself.   Does not feel like room is spinning.  ? dizzy.  It is hard for pt to describe the feeling.  Recently he feels that way when  he stands up from sitting.  When this happens, he denies nausea, chest pain, vomiting, sweating.   Present for > 1 yr, saw a doctor in [place] > 63yrs ago that gave him med that "cleared it up in 3 wks"  Habits & Providers  Alcohol-Tobacco-Diet     Tobacco Status: current     Tobacco Counseling: to quit use of tobacco products     Cigarette Packs/Day: 1.0  Current Medications (verified): 1)  Actos 30 Mg Tabs (Pioglitazone Hcl) .... Take 1 Tablet By Mouth Once A Day 2)  Metformin Hcl 1000 Mg Tabs (Metformin Hcl) .... Take 1 Tablet By Mouth Twice A Day 3)  Zocor 40 Mg Tabs (Simvastatin) .... Take 1 Tablet By Mouth At Bedtime 4)  Advair Diskus 500-50 Mcg/dose  Misc (Fluticasone-Salmeterol) .... One Puff Twice Daily. 5)  Enalapril-Hydrochlorothiazide 5-12.5 Mg Tabs (Enalapril-Hydrochlorothiazide) .Marland Kitchen.. 1 Tab By Mouth Daily (New Dose) 6)  Ventolin Hfa 108 (90 Base) Mcg/act  Aers (Albuterol Sulfate) .... 2-4 Puffs Every 4 Hours As Needed For Wheezing and Shortness of Breath. 7)  Diazepam 10 Mg Tabs (Diazepam) .Marland Kitchen.. 1 Tab By Mouth Qhs As Needed Sleep 8)  Loratadine 10 Mg Tabs (Loratadine) .Marland Kitchen.. 1 Tab By Mouth Daily 9)  O2 .... At Night and in Summer 10)  Aspir-Low 81 Mg Tbec (Aspirin) .... One Tablet Daily 11)  Spiriva Handihaler 18 Mcg Caps (Tiotropium Bromide Monohydrate) .Marland Kitchen.. 1 Cap Inhaled Daily  Allergies (verified): 1)  ! Pcn  Past History:  Family History: Last updated: 01/05/2010 Diabetes 1st degree, heart disease`, hypertension  Sister with MI at 62, father with MI at 90  Social History: Last updated: 04/05/2010 On SS disability.  Used to be a Surveyor, minerals.  Lives alone in a house with a dog.  1 ppd smoker (reg. Flavor), occ.  EtOH (unit 12/2009 drank 2-3 beers per day) No drugs.  Risk Factors: Exercise: yes (05/08/2007)  Risk Factors: Smoking Status: current (04/05/2010) Packs/Day: 1.0 (04/05/2010)  Past Medical History: 1. 6/07-pelvic fracture, 2. 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  3. HYPERLIPIDEMIA (ICD-272.4) 4. DIABETES MELLITUS, TYPE II (ICD-250.00) 5. COPD (ICD-496): Active smoker.  6. HYPERTENSION, BENIGN ESSENTIAL 7. Echo (5/11): EF 60%, mild LVH, mild RV dilation with normal systolic function, PA systolic pressure 33 mmHg.  8. Low back pain: goes to a pain clinic.  9. Lexiscan-myoview (5/11): EF 67%, no ischemia or infarction.     Past Surgical History: ABI = 0.93 (normal) 06/26/06  06/26/2006, anteriolisthesis  01/02/2006, EKG - 1st degree block  12/19/2005, L4-L5 withearly grade 1  01/02/2006, Lipid 10/08/06 TC=136, TG=83, HDL=58, LDL=61 -  10/09/2006, Lumbar x-ray: DDD and facet dz at -  01/02/2006, remove  BCC - 11/13/2005  Family History: Reviewed history from 01/05/2010 and no changes required. Diabetes 1st degree, heart disease`, hypertension  Sister with MI at 7, father with MI at 46  Social History: Reviewed history from 01/05/2010 and no changes required. On SS disability.  Used to be a Surveyor, minerals.  Lives alone in a house with a dog.  1 ppd smoker (reg. Flavor), occ.  EtOH (unit 12/2009 drank 2-3 beers per day) No drugs.Packs/Day:  1.0  Review of Systems       per hpi  Physical Exam  General:  Well-developed,well-nourished,in no acute distress; alert,appropriate and cooperative throughout examination Head:  normocephalic and atraumatic.   Lungs:  Normal respiratory effort, chest expands symmetrically. Lungs are clear to auscultation, no crackles or wheezes. Heart:  Distant heart sounds.   S1 and S2 normal without gallop, murmur, click, rub or other extra sounds. Abdomen:  Bowel sounds positive,abdomen soft and non-tender without masses, organomegaly or hernias noted. Pulses:  R radial normal, R carotid normal, L radial normal, and L carotid normal.   Extremities:  1+ left pedal edema and 1+ right pedal edema.   Neurologic:  alert & oriented X3, cranial nerves II-XII intact, strength normal  in all extremities, and gait normal.   Cervical Nodes:  No lymphadenopathy noted   Impression & Recommendations:  Problem # 1:  HYPERTENSION, BENIGN ESSENTIAL, CONTROLLED (ICD-401.1) Assessment Unchanged  BP at goal.  BP today 115.  May be that this is too low for pt.  He was taking Enalapril  10-Hctz 25 daily.  I know that Dr Georgiana Shore has been trying to get pt's BP under better control since previous BPs were in the 160s.  Since pt is complaining of what sounds like "lightheadedness" I will decrease dose of meds and rtc in 1 wk for recheck.   His updated medication list for this problem includes:    Enalapril-hydrochlorothiazide 5-12.5 Mg Tabs (Enalapril-hydrochlorothiazide) .Marland Kitchen... 1 tab by mouth daily (new dose)  Orders: [facility]- Est  Level 4 ([address])  Problem # 2:  DIZZINESS (ICD-780.4) Assessment: New Pt is complaining of "unsteadiness" when he turns his head, present > 1 yr.  Checked orthostatics, which were negative.  Will check electrolytes and TSH.  He has had Myoview (no infarction/ischemia) and Echo done in 01/2010 which showed no systolic dysfunction, EF 60%, mild LVH, mild RV dilation.  Echo does not show AS.  May be 2/2 to current mild hypotension?  See above.  Will decraese htn med dose.  Will rtc in 1 wk.  May need carotid dopler?   Orders: Basic Met-[facility] [phone]) CBC-[facility] (91478) TSH-[facility] [phone]) [facility]- Est  Level 4 ([address])  Problem # 3:  DIABETES MELLITUS, TYPE II, WITHOUT COMPLICATIONS (ICD-250.00) Assessment: Unchanged A1C 6%, which is at goal, but seems to be increasing since previous A1Cs.  Will not make changes at this time.  Will need close monitoring to ensure that A1C is at goal.  No significant change in weight.   His updated medication list for this problem includes:    Actos 30 Mg Tabs (Pioglitazone hcl) .Marland Kitchen... Take 1 tablet by mouth once a day    Metformin Hcl 1000 Mg Tabs (Metformin hcl) .Marland Kitchen... Take 1 tablet by mouth twice a day    Enalapril-hydrochlorothiazide  5-12.5 Mg Tabs (Enalapril-hydrochlorothiazide) .Marland Kitchen... 1 tab by mouth daily (new dose)    Aspir-low 81 Mg Tbec (Aspirin) ..... One tablet daily  Orders: A1C-[facility] (96295) [facility]- Est  Level 4 (28413)  Complete Medication List: 1)  Actos 30 Mg Tabs (Pioglitazone hcl) .... Take 1 tablet by mouth once a day 2)  Metformin Hcl 1000 Mg Tabs (Metformin hcl) .... Take 1 tablet by mouth twice a day 3)  Zocor 40 Mg Tabs (Simvastatin) .... Take 1 tablet by mouth at bedtime 4)  Advair Diskus 500-50 Mcg/dose Misc (Fluticasone-salmeterol) .... One puff twice daily. 5)  Enalapril-hydrochlorothiazide 5-12.5 Mg Tabs (Enalapril-hydrochlorothiazide) .Marland Kitchen.. 1 tab by mouth daily (new dose) 6)  Ventolin Hfa 108 (90 Base) Mcg/act Aers (Albuterol sulfate) .... 2-4 puffs every 4 hours as needed for wheezing and shortness of breath. 7)  Diazepam 10 Mg Tabs (Diazepam) .Marland Kitchen.. 1 tab by mouth qhs as needed sleep 8)  Loratadine 10 Mg Tabs (Loratadine) .Marland Kitchen.. 1 tab by mouth daily 9)  O2  .... At night and in summer 10)  Aspir-low 33 Mg Tbec (Aspirin) .... One tablet daily 11)  Spiriva Handihaler 18 Mcg Caps (Tiotropium bromide monohydrate) .Marland Kitchen.. 1 cap inhaled daily  Patient Instructions: 1)  Please schedule a follow-up appointment in 1-2 weeks for blood pressure.  2)  New dose of medicine: Enalapril 5mg  - Hychlorothiazide 12.5 mg one tablet daily. 3)  Tobacco is very bad for your health and your loved ones ! You should stop smoking !  4)  Stop smoking tips: Choose a quit date. Cut down before the quit date. Decide what you will do as a substitute when you feel the urge to smoke(gum, toothpick, exercise).  Prescriptions: ENALAPRIL-HYDROCHLOROTHIAZIDE 5-12.5 MG TABS (ENALAPRIL-HYDROCHLOROTHIAZIDE) 1 tab by mouth daily (  new dose)  #30 x 0   Entered and Authorized by:   [name] MD   Signed by:   [name] MD on 04/05/2010   Method used:   Electronically to        [facility]* (retail)       [address]  [address] [place], [place]  [address]       Ph: [phone] or [phone]       Fax: [phone]   RxID:   [id number]   Laboratory Results   Blood Tests   Date/Time Received: April 05, 2010 10:08 AM  Date/Time Reported: April 05, 2010 10:20 AM   HGBA1C: 6.0%   (Normal Range: Non-Diabetic - 3-6%   Control Diabetic - 6-8%)  Comments: ...............test performed by......Marland KitchenBonnie A. [place], MLS (ASCP)cm       Prevention & Chronic Care Immunizations   Influenza vaccine: Fluvax 3+  (07/09/2009)    Tetanus booster: 05/07/2005: Done.   Tetanus booster due: 05/08/2015    Pneumococcal vaccine: Pneumovax  (05/26/2008)    H. zoster vaccine: Not documented  Colorectal Screening   Hemoccult: Done.  (01/06/2006)   Hemoccult action/deferral: Not indicated  (04/05/2010)   Hemoccult due: 01/07/2007    Colonoscopy: Normal  (12/19/2006)   Colonoscopy due: 12/18/2016  Other Screening   PSA: Not documented   Smoking status: current  (04/05/2010)   Smoking cessation counseling: YES  (04/05/2010)  Diabetes Mellitus   HgbA1C: 6.0  (04/05/2010)   Hemoglobin A1C due: 03/05/2008    Eye exam: Not documented    Foot exam: yes  (03/18/2009)   High risk foot: Not documented   Foot care education: Not documented   Foot exam due: 12/03/2008    Urine microalbumin/creatinine ratio: Not documented   Urine microalbumin/cr due: 05/07/2008    Diabetes flowsheet reviewed?: Yes   Progress toward A1C goal: At goal  Lipids   Total Cholesterol: 136  (10/08/2006)   LDL: 61  (10/08/2006)   LDL Direct: 96  (09/09/2009)   HDL: 58  (10/08/2006)   Triglycerides: 83  (10/08/2006)    SGOT (AST): 20  (09/09/2009)   SGPT (ALT): 12  (09/09/2009)   Alkaline phosphatase: 66  (09/09/2009)   Total bilirubin: 0.3  (09/09/2009)    Lipid flowsheet reviewed?: Yes   Progress toward LDL goal: Unchanged  Hypertension   Last Blood Pressure: 115 / 63  (04/05/2010)   Serum  creatinine: 1.36  (09/09/2009)   Serum potassium 4.9  (09/09/2009)    Hypertension flowsheet reviewed?: Yes   Progress toward BP goal: At goal  Self-Management Support :   Personal Goals (by the next clinic visit) :     Personal A1C goal: 7  (07/09/2009)     Personal blood pressure goal: 130/80  (07/09/2009)     Personal LDL goal: 100  (07/09/2009)    Diabetes self-management support: Not documented    Hypertension self-management support: Not documented    Hypertension self-management support not done because: Not indicated  (09/09/2009)    Lipid self-management support: Not documented

## 2010-11-03 NOTE — Assessment & Plan Note (Signed)
Summary: Cardiology Nuclear Study  Nuclear Med Background Indications for Stress Test: Evaluation for Ischemia, Abnormal EKG   History: COPD, Echo  History Comments: 06/09 ECHO EF 60%, mod. LVH mod. 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 He was given a nebulizer treatment with Albuterol 5.0 mg. The patient's O2 sat increased 10 points and to 99%.  Nuclear Med Study 1 or 2 day study:  1 day     Stress Test Type:  Eugenie Birks Reading MD:  Willa Rough, MD     Referring MD:  D.McLean Resting Radionuclide:  Technetium 54m Tetrofosmin     Resting Radionuclide Dose:  11 mCi  Stress Radionuclide:  Technetium 75m Tetrofosmin     Stress Radionuclide Dose:  33 mCi   Stress Protocol   Lexiscan: 0.4 mg   Stress Test Technologist:  Milana Na EMT-P     Nuclear Technologist:  Domenic Polite CNMT  Rest Procedure  Myocardial perfusion imaging was performed at rest 45 minutes following the intravenous administration of Myoview Technetium 35m Tetrofosmin.  Stress Procedure  The patient received IV Lexiscan 0.4 mg over 15-seconds.  Myoview injected at 30-seconds.  There were no significant changes with infusion.  Quantitative spect images were obtained after a 45 minute delay.  QPS Raw Data Images:  Normal; no motion artifact; normal heart/lung ratio. Stress Images:  No diagnostic abnormalities Rest Images:  Same as stress Subtraction (SDS):  Normal wall motionNo evidence of ischemia. Transient Ischemic Dilatation:  .98  (Normal <1.22)  Lung/Heart Ratio:  .27  (Normal <0.45)  Quantitative Gated Spect Images QGS EDV:  150 ml QGS ESV:  49 ml QGS EF:  67 % QGS cine images:  Normal wall motion  Findings Normal nuclear study      Overall Impression  Exercise Capacity: Lexiscan BP Response: Normal blood pressure response. Clinical Symptoms: SOB ECG Impression: No significant ST segment change suggestive of ischemia. Overall Impression: Normal stress nuclear study.  Appended Document: Cardiology Nuclear Study normal study  Appended Document: Cardiology Nuclear Study appt 02-03-10 with Dr Shirlee Latch

## 2010-11-03 NOTE — Assessment & Plan Note (Signed)
Summary: f/up,tcb   Vital Signs:  Patient profile:   65 year old male Weight:      258 pounds Pulse rate:   72 / minute BP sitting:   156 / 70  (right arm) Cuff size:   regular  Vitals Entered By: San Morelle, SMA CC: F/up HTN and change of meds Is Patient Diabetic? Yes Did you bring your meter with you today? No Pain Assessment Patient in pain? yes     Location: back Intensity: 7   Primary Care Provider:  Ardeen Garland  MD  CC:  F/up HTN and change of meds.  History of Present Illness: Alex Foster comes in today to follow-up his HTN and discuss several other issues. 1) HTN - was controlled on 10mg  enalapril until recently.  Upped to 20 and did not tolerate.  Took down to 15mg  and tolerating but BP not well-controlled yet.  He takes it himself and also running in 150's.  THis is the only BP medicine he has ever taken.  2) Allergies - would like to go back on his alergy medicine (loratidine). 3) Sleep - has been on valium 5 mg at night for sleep for some time.  Recently not working as well.  Can't stay asleep. Would like to try h igher dose.  Does not experience lightheadedness or dizziness or any side effects from teh valium that he can tell.  Has never abused it. 4) Cholesterol - wants to know if his cholesterol needs rechecked.  Told him we checked it in December and it was at goal. 5) Heart disease - sister died suddenly from heart attack last month.  Was not know to have heart problems, but was on dialysis for ESRD.  He is very worried about his heart nad requests to see a cardiologist. Denies chest pain or dyspnea on exertion.    Habits & Providers  Alcohol-Tobacco-Diet     Tobacco Status: current     Tobacco Counseling: to quit use of tobacco products     Cigarette Packs/Day: 2.0  Social History: Packs/Day:  2.0  Physical Exam  General:  obese, alert, NAD.  Vital reviewed. Lungs:  Normal respiratory effort, chest expands symmetrically. Lungs are clear to  auscultation, no crackles or wheezes. Heart:  Normal rate and regular rhythm. S1 and S2 normal without gallop, murmur, click, rub or other extra sounds. Pulses:  2+ radial Extremities:  no edema   Impression & Recommendations:  Problem # 1:  HYPERTENSION, BENIGN ESSENTIAL, CONTROLLED (ICD-401.1) Assessment Unchanged  CHnage med to enalapril-hctz 10-25.  see back in 69month.  Obtain BMET at that time as CR up at last check.  The following medications were removed from the medication list:    Enalapril Maleate 5 Mg Tabs (Enalapril maleate) .Marland Kitchen... 1 tab by mouth daily for high blood pressure (take with the 10mg  pill) His updated medication list for this problem includes:    Enalapril-hydrochlorothiazide 10-25 Mg Tabs (Enalapril-hydrochlorothiazide) .Marland Kitchen... 1 tab by mouth daily  Orders: [facility]- Est  Level 4 ([address]) Cardiology Referral (Cardiology)  Problem # 2:  ALLERGIC RHINITIS, SEASONAL (ICD-477.0)  REstart loratidine.    Orders: [facility]- Est  Level 4 (99214)  Problem # 3:  INSOMNIA, CHRONIC (ICD-307.42) Assessment: Deteriorated  Will try valium 10.  If no help, consider different agent.   Orders: [facility]- Est  Level 4 ([address]214)  Problem # 4:  DIABETES MELLITUS, TYPE II, WITHOUT COMPLICATIONS (ICD-250.00) Assessment: Unchanged  At goal.  WE check q 6 months.  Given  his HTN, DM, HLD, and tobacco abuse, feel cards referral is not unreasonable and is a good idea.  Will put in for cards referral to eval for cardiovascular disease.  The following medications were removed from the medication list:    Enalapril Maleate 5 Mg Tabs (Enalapril maleate) .Marland Kitchen... 1 tab by mouth daily for high blood pressure (take with the 10mg  pill) His updated medication list for this problem includes:    Actos 30 Mg Tabs (Pioglitazone hcl) .Marland Kitchen... Take 1 tablet by mouth once a day    Metformin Hcl 1000 Mg Tabs (Metformin hcl) .Marland Kitchen... Take 1 tablet by mouth twice a day    Enalapril-hydrochlorothiazide 10-25 Mg Tabs  (Enalapril-hydrochlorothiazide) .Marland Kitchen... 1 tab by mouth daily  Orders: Cardiology Referral (Cardiology)  Complete Medication List: 1)  Actos 30 Mg Tabs (Pioglitazone hcl) .... Take 1 tablet by mouth once a day 2)  Metformin Hcl 1000 Mg Tabs (Metformin hcl) .... Take 1 tablet by mouth twice a day 3)  Zocor 40 Mg Tabs (Simvastatin) .... Take 1 tablet by mouth at bedtime 4)  Advair Diskus 500-50 Mcg/dose Misc (Fluticasone-salmeterol) .... One puff twice daily. 5)  Enalapril-hydrochlorothiazide 10-25 Mg Tabs (Enalapril-hydrochlorothiazide) .Marland Kitchen.. 1 tab by mouth daily 6)  Ventolin Hfa 108 (90 Base) Mcg/act Aers (Albuterol sulfate) .... 2-4 puffs every 4 hours as needed for wheezing and shortness of breath. 7)  Diazepam 10 Mg Tabs (Diazepam) .Marland Kitchen.. 1 tab by mouth qhs as needed sleep 8)  Loratadine 10 Mg Tabs (Loratadine) .Marland Kitchen.. 1 tab by mouth daily  Patient Instructions: 1)  Your blood pressure is still too high.  I have given you a prescription for a new pill.  It is 2 medicines in one - the medicine you have already been taking (enalapril) and a new one, hydrochlorothizide.  Stop taking the enalapril you have at home, as it is already in this new pill.  Fill the new prescription and take 1 tablet once a day. 2)  We will work on a cardiology referral for you.  We will call you when we get the appointment arranged. 3)  Please return in 1 month for a recheck of your blood pressure and bloodwork. 4)  Have a great weekend! Prescriptions: LORATADINE 10 MG TABS (LORATADINE) 1 tab by mouth daily  #30 x 5   Entered and Authorized by:   Ardeen Garland  MD   Signed by:   Ardeen Garland  MD on 12/16/2009   Method used:   Print then Give to Patient   RxID:   [id number] DIAZEPAM 10 MG TABS (DIAZEPAM) 1 tab by mouth qHS as needed sleep  #30 x 3   Entered and Authorized by:   Ardeen Garland  MD   Signed by:   Ardeen Garland  MD on 12/16/2009   Method used:   Print then Give to Patient   RxID:    [id number] ENALAPRIL-HYDROCHLOROTHIAZIDE 10-25 MG TABS (ENALAPRIL-HYDROCHLOROTHIAZIDE) 1 tab by mouth daily  #30 x 3   Entered and Authorized by:   Ardeen Garland  MD   Signed by:   Ardeen Garland  MD on 12/16/2009   Method used:   Print then Give to Patient   RxID:   [id number]

## 2010-11-03 NOTE — Assessment & Plan Note (Signed)
Summary: f/u dizziness   Vital Signs:  Patient profile:   65 year old male Height:      70 inches Weight:      278.50 pounds BMI:     40.11 Temp:     98.5 degrees F oral Pulse rate:   78 / minute BP sitting:   151 / 79  (right arm) Cuff size:   large  Vitals Entered By: Jimmy Footman, CMA (August 17, 2010 1:51 PM) CC: follow-up visit dizziness Is Patient Diabetic? Yes Did you bring your meter with you today? No Pain Assessment Patient in pain? no        Primary Care Provider:  Kyaire Gruenewald MD  CC:  follow-up visit dizziness.  History of Present Illness: 65 y/o M with DM, HTN, HLD   Dizziness: I referred pt to Vestibular clinic for symptoms of BPV but he has not been.  He states that it was recommended to him to try taking Mucadine powder.  He is taking it two times a day and sometimes extra with meals.  He states that dizziness has improved.  He does not want to go to Vestibular clinic.  +some dizziness, no falls, no syncope, no vomiting  DIABETES Meds: metformin 1000mg  two times a day, Actos 30mg  daily Compliance:yes    Diet: no.  He has stopped drinking alcohol, and instead of drinking he is eating more snacks (chips, crackers).  He has gained 11.8 lbs since 05/2010 Lightheadedness:yes    Dizziness:sometimes, but much improved form previous    Confusion:no    Shakiness:np    Abd  pain:no    Nausea:no    Vomiting:no Pt is very concerned about what he has been seeing about Actos on TV.  He has seen a lot of ads that Actos causes bladder cancer.  He would like to stop taking another medicine in place of it.              Habits & Providers  Alcohol-Tobacco-Diet     Tobacco Status: current     Tobacco Counseling: to quit use of tobacco products     Cigarette Packs/Day: 1.5  Current Medications (verified): 1)  Actos 30 Mg Tabs (Pioglitazone Hcl) .... Take 1 Tablet By Mouth Once A Day 2)  Metformin Hcl 1000 Mg Tabs (Metformin Hcl) .... Take 1 Tablet By Mouth Twice A  Day 3)  Zocor 40 Mg Tabs (Simvastatin) .... Take 1 Tablet By Mouth At Bedtime 4)  Advair Diskus 500-50 Mcg/dose  Misc (Fluticasone-Salmeterol) .... One Puff Twice Daily. 5)  Enalapril-Hydrochlorothiazide 5-12.5 Mg Tabs (Enalapril-Hydrochlorothiazide) .Marland Kitchen.. 1 Tab By Mouth Daily (New Dose) 6)  Ventolin Hfa 108 (90 Base) Mcg/act  Aers (Albuterol Sulfate) .... 2-4 Puffs Every 4 Hours As Needed For Wheezing and Shortness of Breath. 7)  Diazepam 10 Mg Tabs (Diazepam) .Marland Kitchen.. 1 Tab By Mouth Qhs As Needed Sleep 8)  Loratadine 10 Mg Tabs (Loratadine) .Marland Kitchen.. 1 Tab By Mouth Daily 9)  O2 .... At Night and in Summer 10)  Aspir-Low 81 Mg Tbec (Aspirin) .... One Tablet Daily 11)  Spiriva Handihaler 18 Mcg Caps (Tiotropium Bromide Monohydrate) .Marland Kitchen.. 1 Cap Inhaled Daily  Allergies (verified): 1)  ! Pcn  Review of Systems       per hpi   Physical Exam  General:  Well-developed,well-nourished,in no acute distress; alert,appropriate and cooperative throughout examination. Vitals reviewed.  Neurologic:  alert & oriented X3.     Impression & Recommendations:  Problem # 1:  BENIGN POSITIONAL VERTIGO (  EAV-409.81) Assessment Improved Dizziness has improved since he started taking [place] powder.  Looking up this herbal supplement, it is a grapevine extract.  Although there has been no indications for the treatment of dizziness, I do not feel that it may be harmful.  It should not cause elevation in glucose, BP, or cholestserol.  Will monitor.  If pt begins to have dizziness again, will refer to vestibular clinic.     Orders: [facility]- Est Level  3 (19147)  Problem # 2:  DIABETES MELLITUS, TYPE II, WITHOUT COMPLICATIONS (ICD-250.00) Assessment: Unchanged A1C at goal at 6.2%.  His goal is <8.  Given pt's concern about bladder cancer with Actos, we will stop that medication.  I will try pt on just Metformin 1000mg  two times a day for the next 3 months.  Will tolerate elevation in A1C up to 7.  If more than seven, will  add another agent.  May add Januvia if he can afford it.  May also consider Glucotrol.    The following medications were removed from the medication list:    Actos 30 Mg Tabs (Pioglitazone hcl) .Marland Kitchen... Take 1 tablet by mouth once a day His updated medication list for this problem includes:    Metformin Hcl 1000 Mg Tabs (Metformin hcl) .Marland Kitchen... Take 1 tablet by mouth twice a day    Enalapril-hydrochlorothiazide 5-12.5 Mg Tabs (Enalapril-hydrochlorothiazide) .Marland Kitchen... 1 tab by mouth daily (new dose)    Aspir-low 81 Mg Tbec (Aspirin) ..... One tablet daily  Orders: A1C-[facility] (82956) [facility]- Est Level  3 (99213)Future Orders: Comp Met-[facility] ([id number]) ... 08/05/2011 CBC-[facility] ([id number]) ... 08/05/2011  Complete Medication List: 1)  Metformin Hcl 1000 Mg Tabs (Metformin hcl) .... Take 1 tablet by mouth twice a day 2)  Zocor 40 Mg Tabs (Simvastatin) .... Take 1 tablet by mouth at bedtime 3)  Advair Diskus 500-50 Mcg/dose Misc (Fluticasone-salmeterol) .... One puff twice daily. 4)  Enalapril-hydrochlorothiazide 5-12.5 Mg Tabs (Enalapril-hydrochlorothiazide) .Marland Kitchen.. 1 tab by mouth daily (new dose) 5)  Ventolin Hfa 108 (90 Base) Mcg/act Aers (Albuterol sulfate) .... 2-4 puffs every 4 hours as needed for wheezing and shortness of breath. 6)  Diazepam 10 Mg Tabs (Diazepam) .Marland Kitchen.. 1 tab by mouth qhs as needed sleep 7)  Loratadine 10 Mg Tabs (Loratadine) .Marland Kitchen.. 1 tab by mouth daily 8)  O2  .... At night and in summer 9)  Aspir-low 52 Mg Tbec (Aspirin) .... One tablet daily 10)  Spiriva Handihaler 18 Mcg Caps (Tiotropium bromide monohydrate) .Marland Kitchen.. 1 cap inhaled daily  Other Orders: Future Orders: Lipid-[facility] ([id number]) ... 08/05/2011 Vit D, 25 OH-[facility] ([id number]) ... 08/05/2011   Orders Added: 1)  A1C-[facility] [83036] 2)  [facility]- Est Level  3 [99213] 3)  Comp Met-[facility] [[id number]] 4)  Lipid-[facility] [[id number]] 5)  CBC-[facility] [85027] 6)  Vit D, 25 [facility] [[id number]]    Laboratory Results   Blood Tests   Date/Time  Received: August 17, 2010 1:47 PM  Date/Time Reported: August 17, 2010 2:13 PM   HGBA1C: 6.2%   (Normal Range: Non-Diabetic - 3-6%   Control Diabetic - 6-8%)  Comments: ...............test performed by......Marland KitchenBonnie A. [place], MLS (ASCP)cm      Prevention & Chronic Care Immunizations   Influenza vaccine: Fluvax 3+  (07/09/2009)    Tetanus booster: 05/07/2005: Done.   Tetanus booster due: 05/08/2015    Pneumococcal vaccine: Pneumovax  (05/26/2008)    H. zoster vaccine: Not documented  Colorectal Screening   Hemoccult: Done.  (01/06/2006)   Hemoccult action/deferral: Not indicated  (04/05/2010)  Hemoccult due: 01/07/2007    Colonoscopy: Normal  (12/19/2006)   Colonoscopy due: 12/18/2016  Other Screening   PSA: Not documented   Smoking status: current  (08/17/2010)   Smoking cessation counseling: YES  (04/05/2010)  Diabetes Mellitus   HgbA1C: 6.2  (08/17/2010)   Hemoglobin A1C due: 03/05/2008    Eye exam: Not documented    Foot exam: yes  (03/18/2009)   High risk foot: Not documented   Foot care education: Not documented   Foot exam due: 12/03/2008    Urine microalbumin/creatinine ratio: Not documented   Urine microalbumin/cr due: 05/07/2008    Diabetes flowsheet reviewed?: Yes   Progress toward A1C goal: At goal  Lipids   Total Cholesterol: 136  (10/08/2006)   LDL: 61  (10/08/2006)   LDL Direct: 96  (09/09/2009)   HDL: 58  (10/08/2006)   Triglycerides: 83  (10/08/2006)    SGOT (AST): 20  (09/09/2009)   SGPT (ALT): 12  (09/09/2009) CMP ordered    Alkaline phosphatase: 66  (09/09/2009)   Total bilirubin: 0.3  (09/09/2009)    Lipid flowsheet reviewed?: Yes   Progress toward LDL goal: Unchanged  Hypertension   Last Blood Pressure: 151 / 79  (08/17/2010)   Serum creatinine: 1.16  (04/05/2010)   Serum potassium 5.4  (04/05/2010) CMP ordered     Hypertension flowsheet reviewed?: Yes   Progress toward BP goal: Deteriorated  Self-Management  Support :   Personal Goals (by the next clinic visit) :     Personal A1C goal: 7  (07/09/2009)     Personal blood pressure goal: 130/80  (07/09/2009)     Personal LDL goal: 100  (07/09/2009)    Diabetes self-management support: Not documented    Hypertension self-management support: Not documented    Hypertension self-management support not done because: Not indicated  (09/09/2009)    Lipid self-management support: Not documented

## 2010-11-03 NOTE — Progress Notes (Signed)
Summary: Rx Req  Phone Note Refill Request Call back at Home Phone [phone] Message from:  Patient  Refills Requested: Medication #1:  ENALAPRIL-HYDROCHLOROTHIAZIDE 5-12.5 MG TABS 1 tab by mouth daily (new dose) [facility]  PT IS OUT  Initial call taken by: Clydell Hakim,  May 09, 2010 8:42 AM    Prescriptions: ENALAPRIL-HYDROCHLOROTHIAZIDE 5-12.5 MG TABS (ENALAPRIL-HYDROCHLOROTHIAZIDE) 1 tab by mouth daily (new dose)  #30 x 3   Entered and Authorized by:   [name] MD   Signed by:   [name] MD on 05/09/2010   Method used:   Electronically to        [facility]* (retail)       [address] [place], [place]  [address]       Ph: [phone] or [phone]       Fax: [phone]   RxID:   [id number]

## 2010-11-03 NOTE — Consult Note (Signed)
Summary: [facility]   Imported By: De Nurse 10/12/2009 10:59:37  _____________________________________________________________________  External Attachment:    Type:   Image     Comment:   External Document

## 2010-11-03 NOTE — Assessment & Plan Note (Signed)
Summary: [email or web address]   Primary Provider:  Ardeen Garland  MD  CC:  rov.  Marland Kitchen  History of Present Illness: 65 yo with history of HTN, DM, hyperlipidemia, and smoking returns for followup cardiac evaluation.  He is concerned given multiple coronary risk factors.  His sister had an MI at 37 and his father had an MI at 63.  He smokes 1-2 ppd.  Patient has never had chest pain or tightness but does have significant exertional dyspnea.  He is short of breath after walking 1/8 mile or climbing up a flight of steps.  Patient has COPD.  He is on Advair and recently restarted Spiriva.  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  He denies syncope or presyncope.   Given the shortness of breath and abnormal ECG, I did a Lexiscan myoview which showed EF 67% and no evidence for ischemia or infarction.  Echo showed EF 60%. The RV was mildly dilated but systolic function looked normal and there was no pulmonary hypertension.    I had the patient walk around the office today. Oxygen saturation initially dropped to 87% with exertion but rose back up to 92% as he walked farther.   ECG: NSR, 1st degree AV block (significant, 306 msec), LAFB, RBBB  Labs (12/10): K 4.9, creatinine 1.36, LDL 96  Current Medications (verified): 1)  Actos 30 Mg Tabs (Pioglitazone Hcl) .... Take 1 Tablet By Mouth Once A Day 2)  Metformin Hcl 1000 Mg Tabs (Metformin Hcl) .... Take 1 Tablet By Mouth Twice A Day 3)  Zocor 40 Mg Tabs (Simvastatin) .... Take 1 Tablet By Mouth At Bedtime 4)  Advair Diskus 500-50 Mcg/dose  Misc (Fluticasone-Salmeterol) .... One Puff Twice Daily. 5)  Enalapril-Hydrochlorothiazide 10-25 Mg Tabs (Enalapril-Hydrochlorothiazide) .Marland Kitchen.. 1 Tab By Mouth Daily 6)  Ventolin Hfa 108 (90 Base) Mcg/act  Aers (Albuterol Sulfate) .... 2-4 Puffs Every 4 Hours As Needed For Wheezing and Shortness of Breath. 7)  Diazepam 10 Mg Tabs (Diazepam) .Marland Kitchen.. 1 Tab By  Mouth Qhs As Needed Sleep 8)  Loratadine 10 Mg Tabs (Loratadine) .Marland Kitchen.. 1 Tab By Mouth Daily 9)  O2 .... At Night and in Summer 10)  Aspir-Low 81 Mg Tbec (Aspirin) .... One Tablet Daily 11)  Spiriva Handihaler 18 Mcg Caps (Tiotropium Bromide Monohydrate) .Marland Kitchen.. 1 Cap Inhaled Daily  Allergies (verified): 1)  ! Pcn  Past History:  Past Medical History: 1. 6/07-pelvic fracture, 2. 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  3. HYPERLIPIDEMIA (ICD-272.4) 4. DIABETES MELLITUS, TYPE II (ICD-250.00) 5. COPD (ICD-496): Active smoker.  6. HYPERTENSION, BENIGN ESSENTIAL 7. Echo (5/11): EF 60%, mild LVH, mild RV dilation with normal systolic function, PA systolic pressure 33 mmHg.  8. Low back pain: goes to a pain clinic.  9. Lexiscan-myoview (5/11): EF 67%, no ischemia or infarction.     Family History: Reviewed history from 01/05/2010 and no changes required. Diabetes 1st degree, heart disease`, hypertension  Sister with MI at 16, father with MI at 70  Social History: Reviewed history from 01/05/2010 and no changes required. On SS disability. 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 1 ppd smoker (reg. Flavor), occ. EtOH, no drugs.  Review of Systems       All systems reviewed and negative except as per HPI.   Vital Signs:  Patient profile:   66 year old male Height:      70  inches Weight:      265 pounds BMI:     38.16 Pulse rate:   76 / minute Pulse rhythm:   regular BP sitting:   116 / 64  (left arm) Cuff size:   large  Vitals Entered By: Judithe Modest CMA (Feb 03, 2010 10:29 AM)  Physical Exam  General:  Well developed, well nourished, in no acute distress. Obese.  Neck:  Neck supple, no JVD. No masses, thyromegaly or abnormal cervical nodes. Lungs:  Distant breath sounds with slight bibasilar crackles.  Heart:  Non-displaced PMI, chest non-tender; distant heart sounds, regular rate and rhythm, S1, S2 without murmurs, rubs or  gallops. Carotid upstroke normal, no bruit. 1+ edema 1/3 up lower legs bilaterally.  Abdomen:  Bowel sounds positive; abdomen soft and non-tender without masses, organomegaly, or hernias noted. No hepatosplenomegaly.  Obese.  Extremities:  No clubbing or cyanosis. Neurologic:  Alert and oriented x 3. Psych:  Normal affect.   Impression & Recommendations:  Problem # 1:  DYSPNEA ON EXERTION (ICD-786.09) I suspect that this is mainly due to COPD.  He had normal LV systolic function and no ischemia or infarction on myoview.  Oxygen saturation dropped to 87% with ambulation initially but rose back up to 92% with continued ambulation so I do not think he qualifies for home O2 at this point.  He should continue Spiriva and Advair.  His RV is mildly dilated but RV function is normal and PA systolic pressure is not elevated, so doubt significant cor pulmonale.  Given CAD risk factors, he should continue ASA 81.   Problem # 2:  HYPERTENSION, BENIGN ESSENTIAL, CONTROLLED (ICD-401.1) BP is under good control on current meds, continue.  Avoid beta blockers due to COPD and conduction system disease.   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  No history of syncope. Patient still needs to get a 48 hour holter monitor to make sure that there are no significant bradyarrhythmias or pauses.   Problem # 4:  HYPERLIPIDEMIA, CONTROLLED (ICD-272.4) Given the patient's multiple risk factors for CAD including DM, would aim for LDL at least < 100 and ideally < 70.   Problem # 5:  SMOKING It is imperative that he quit smoking.  We discussed this today. He wants to try an electronic cigarette.  I will write a prescription for this to see if he can get coverage.   Other Orders: Holter Monitor (Holter Monitor)  Patient Instructions: 1)  Your physician has recommended that you wear a  holter monitor.  Holter monitors are medical devices that record the heart's electrical activity. Doctors most often use these monitors to diagnose arrhythmias. Arrhythmias are problems with the speed or rhythm of the heartbeat. The monitor is a small, portable device. You can wear one while you do your normal daily activities. This is usually used to diagnose what is causing palpitations/syncope (passing out). 48 hour monitor 2)  You have the prescription for the electronic cigarette 3)  Your physician recommends that you schedule a follow-up appointment as needed with Dr Shirlee Latch.

## 2010-11-03 NOTE — Assessment & Plan Note (Signed)
Summary: NP6/ 401.1,272.4,250.00,305.1   Visit Type:  np/6 Primary Provider:  Ardeen Garland  MD  CC:  Pt.states and I need my heart check .Marland Kitchen  History of Present Illness: 65 yo with history of HTN, DM, hyperlipidemia, and smoking presents for cardiac evaluation.  He is concerned given multiple coronary risk factors.  His sister had an MI at 58 and his father had an MI at 71.  He smokes 1-2 ppd.  His blood pressure has been running high, it was 163/86 today.  Patient was on enalapril 20 mg dailiy but was unable to tolerate it due to lightheadedness.  He was recently started on enalapril/HCTZ 10/25 and is supposed to followup on his BP in 2 wks with Dr. Georgiana Shore. Patient has never had chest pain or tightness but does have significant exertional dyspnea.  He is short of breath after walking 1/8 mile or climbing up a flight of steps.  Of note, he does have COPD and uses Advair.  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  He denies syncope or presyncope.   ECG: NSR, 1st degree AV block (significant, 306 msec), LAFB, RBBB  Labs (12/10): K 4.9, creatinine 1.36, LDL 96  Problems Prior to Update: 1)  Insomnia, Chronic  (ICD-307.42) 2)  Allergic Rhinitis, Seasonal  (ICD-477.0) 3)  Diabetes Mellitus, Type II, Without Complications  (ICD-250.00) 4)  Hypertension, Benign Essential, Controlled  (ICD-401.1) 5)  Hyperlipidemia, Controlled  (ICD-272.4) 6)  Umbilical Hernia  (ICD-553.1) 7)  Benign Positional Vertigo  (ICD-386.11) 8)  COPD  (ICD-496) 9)  Tobacco Abuse  (ICD-305.1)  Current Medications (verified): 1)  Actos 30 Mg Tabs (Pioglitazone Hcl) .... Take 1 Tablet By Mouth Once A Day 2)  Metformin Hcl 1000 Mg Tabs (Metformin Hcl) .... Take 1 Tablet By Mouth Twice A Day 3)  Zocor 40 Mg Tabs (Simvastatin) .... Take 1 Tablet By Mouth At Bedtime 4)  Advair Diskus 500-50 Mcg/dose  Misc (Fluticasone-Salmeterol) .... One Puff Twice  Daily. 5)  Enalapril-Hydrochlorothiazide 10-25 Mg Tabs (Enalapril-Hydrochlorothiazide) .Marland Kitchen.. 1 Tab By Mouth Daily 6)  Ventolin Hfa 108 (90 Base) Mcg/act  Aers (Albuterol Sulfate) .... 2-4 Puffs Every 4 Hours As Needed For Wheezing and Shortness of Breath. 7)  Diazepam 10 Mg Tabs (Diazepam) .Marland Kitchen.. 1 Tab By Mouth Qhs As Needed Sleep 8)  Loratadine 10 Mg Tabs (Loratadine) .Marland Kitchen.. 1 Tab By Mouth Daily 9)  O2 .... At Night and in Summer  Allergies (verified): 1)  ! Pcn  Past History:  Past Medical History: 1. 6/07-pelvic fracture, 2. 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  3. HYPERLIPIDEMIA (ICD-272.4) 4. DIABETES MELLITUS, TYPE II (ICD-250.00) 5. COPD (ICD-496): Active smoker.  6. HYPERTENSION, BENIGN ESSENTIAL 7. Echo (6/09): EF 60%, moderate LVH, grade I diastolic dysfunction, moderate RV dilation with normal systolic function, suspect that PA systolic pressure was underestimated.  8. Low back pain: goes to a pain clinic.      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 1 ppd smoker (reg. Flavor), occ. EtOH, no drugs.  Review of Systems       All systems reviewed and negative except as per HPI.   Vital Signs:  Patient profile:   65 year old male Height:      70  inches Weight:      269 pounds BMI:     38.74 Pulse rate:   78 / minute BP sitting:   163 / 86  (left arm) Cuff size:   large  Vitals Entered By: Oswald Hillock (January 05, 2010 12:12 PM)  Physical Exam  General:  Well developed, well nourished, in no acute distress. obese.  Head:  normocephalic and atraumatic Nose:  no deformity, discharge, inflammation, or lesions Mouth:  Poor dentition.  Neck:  Neck supple, no JVD. No masses, thyromegaly or abnormal cervical nodes. Lungs:  Distant breath sounds, no crackles.  Heart:   Non-displaced PMI, chest non-tender; regular rate and rhythm, S1, S2 without murmurs, rubs or gallops. Carotid upstroke normal, no bruit. Pedals normal pulses. 1+ edema to knees bilaterally.  Abdomen:  Bowel sounds positive; abdomen soft and non-tender without masses, organomegaly, or hernias noted. No hepatosplenomegaly.  Obese.  Msk:  Back normal, normal gait. Muscle strength and tone normal. Extremities:  No clubbing or cyanosis. Neurologic:  Alert and oriented x 3. Skin:  Intact without lesions or rashes. Psych:  Normal affect.   Impression & Recommendations:  Problem # 1:  DYSPNEA ON EXERTION (ICD-786.09) The differential diagnosis for this includes COPD, ischemic diastolic dysfunction, and cor pulmonale.  Patient certainly has a degree of COPD and the addition of Spiriva to his regimen could be helpful.  Dyspnea could certainly be an anginal equivalent given the patient's multiple CAD risk factors (smoking, DM, HTN, hyperlipidemia, family history).  Finally, patient had a dilated RV on echo in 2009 that could signify cor pulmonale from COPD and hypoxic pulmonary vasoconstriction causing pulmonary HTN.   Steffanie Dunn to assess for ischemia (patient unlikely to be able to walk adequately due to back pain).  - Echo to assess for RV failure and pulmonary HTN.  At next appointment I will measure his oxygen saturation while walking to see if he become hypoxic.  - Patient will start ASA 81 mg daily.   Problem # 2:  HYPERLIPIDEMIA, CONTROLLED (ICD-272.4) Given the patient's multiple risk factors for CAD including DM, would aim for LDL at least < 100 and ideally < 70.   Problem # 3:  HYPERTENSION, BENIGN ESSENTIAL, CONTROLLED (ICD-401.1) BP is still high on enalapril/HCTZ.  I think that he is going to need another agent.  He wants to have his BP checked again with Dr. Georgiana Shore in 2 wks rather than starting something today.  I think that Norvasc 5 mg daily would be the best choice.  Would avoid  beta blockers given significant conduction system disease on ECG.   Problem # 4:  UNSPECIFIED BUNDLE BRANCH BLOCK (ICD-426.50) Significant conduction system disease with 1st degree AV block (significant, 306 msec), RBBB, and left anterior fascicular block.  No history of syncope. I will get a 48 hour holter monitor to make sure that there are no significant bradyarrhythmias or pauses.   Problem # 5:  TOBACCO ABUSE (ICD-305.1) I strongly encouraged him to quit smoking.  He has failed Chantix in the past and does not want to take it again.   Other Orders: Nuclear Stress Test (Nuc Stress Test) Holter Monitor (Holter Monitor) Echocardiogram (Echo)  Patient Instructions: 1)  Your physician has recommended you make the following change in your medication:  2)  Start Aspirin 81mg  daily--this should be buffered or coated 3)  Your physician has requested that you have an echocardiogram.  Echocardiography is a painless test that uses sound waves to create  images of your heart. It provides your doctor with information about the size and shape of your heart and how well your heart's chambers and valves are working.  This procedure takes approximately one hour. There are no restrictions for this procedure. 4)  Your physician has recommended that you wear a holter monitor.  Holter monitors are medical devices that record the heart's electrical activity. Doctors most often use these monitors to diagnose arrhythmias. Arrhythmias are problems with the speed or rhythm of the heartbeat. The monitor is a small, portable device. You can wear one while you do your normal daily activities. This is usually used to diagnose what is causing palpitations/syncope (passing out). 48 hour 5)  Your physician has requested that you have an lexiscan myoview.  For further information please visit [email or web address]  Please follow instruction sheet, as given. 6)  Your physician recommends that you schedule a follow-up appointment  in: 2-3 weeks with Dr Shirlee Latch after testing has been completed.

## 2010-11-03 NOTE — Progress Notes (Signed)
Summary: triage  Phone Note Call from Patient   Summary of Call: chest discomfort Initial call taken by: De Nurse,  March 17, 2010 2:44 PM  Follow-up for Phone Call        c/o" burning, hurting sensation at top of chest more on the r side. feels like someone hit me".  sob but states he alway is. has a/c in home & is not sweating. no nausea. feels weak. started 3-4 days ago. suggested ED for evaluation. he refused. he has not called his heart md. he was there last month. asked him to call his heart md now. after he is seen there or in ed, will need to see him for f/u. he agreed with plan Follow-up by: Golden Circle RN,  March 17, 2010 2:46 PM

## 2010-11-03 NOTE — Assessment & Plan Note (Signed)
Summary: F/U/KH   Vital Signs:  Patient profile:   65 year old male Height:      70 inches Weight:      267 pounds BMI:     38.45 BSA:     2.36 Temp:     98.3 degrees F Pulse rate:   84 / minute BP sitting:   134 / 69  Vitals Entered By: Jone Baseman CMA (May 03, 2010 10:57 AM) CC: fu dizziness Is Patient Diabetic? No Pain Assessment Patient in pain? no        Primary Care Provider:  Cat Ta MD  CC:  fu dizziness.  History of Present Illness: 65 y/o M with DM, CAD, tobacco use, h/o CVA here for f/u of dizziness.  He has been feeling lightheaded and dizzy when he turns his head or when he stands up.  He denies syncope.  We checked orthostatics at last visit and it was normal.  Since last visit he has had carotid u/s that was normal.  He has also has myoview and echo in 01/2010 that were essentially normal.    Habits & Providers  Alcohol-Tobacco-Diet     Tobacco Status: current     Tobacco Counseling: to quit use of tobacco products     Cigarette Packs/Day: 1.5  Current Medications (verified): 1)  Actos 30 Mg Tabs (Pioglitazone Hcl) .... Take 1 Tablet By Mouth Once A Day 2)  Metformin Hcl 1000 Mg Tabs (Metformin Hcl) .... Take 1 Tablet By Mouth Twice A Day 3)  Zocor 40 Mg Tabs (Simvastatin) .... Take 1 Tablet By Mouth At Bedtime 4)  Advair Diskus 500-50 Mcg/dose  Misc (Fluticasone-Salmeterol) .... One Puff Twice Daily. 5)  Enalapril-Hydrochlorothiazide 5-12.5 Mg Tabs (Enalapril-Hydrochlorothiazide) .Marland Kitchen.. 1 Tab By Mouth Daily (New Dose) 6)  Ventolin Hfa 108 (90 Base) Mcg/act  Aers (Albuterol Sulfate) .... 2-4 Puffs Every 4 Hours As Needed For Wheezing and Shortness of Breath. 7)  Diazepam 10 Mg Tabs (Diazepam) .Marland Kitchen.. 1 Tab By Mouth Qhs As Needed Sleep 8)  Loratadine 10 Mg Tabs (Loratadine) .Marland Kitchen.. 1 Tab By Mouth Daily 9)  O2 .... At Night and in Summer 10)  Aspir-Low 81 Mg Tbec (Aspirin) .... One Tablet Daily 11)  Spiriva Handihaler 18 Mcg Caps (Tiotropium Bromide  Monohydrate) .Marland Kitchen.. 1 Cap Inhaled Daily  Allergies (verified): 1)  ! Pcn  Past History:  Past Medical History: Last updated: 04/13/2010 1. 6/07-pelvic fracture, 2. 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  3. HYPERLIPIDEMIA (ICD-272.4) 4. DIABETES MELLITUS, TYPE II (ICD-250.00) 5. COPD (ICD-496): Active smoker.  6. HYPERTENSION, BENIGN ESSENTIAL 7. Echo (5/11): EF 60%, mild LVH, mild RV dilation with normal systolic function, PA systolic pressure 33 mmHg.  8. Low back pain: goes to a pain clinic.  9. Lexiscan-myoview (5/11): EF 67%, no ischemia or infarction.  10. O2 use 2L for sleep  Past Surgical History: Last updated: 04/21/2010 -Carotid dopler 04/20/10: No significant extracranial carotid artery stenosis demonstrated., read by Dr Darrick Penna -Bialteral catracts signs 11/2008 -ABI = 0.93 (normal) 06/26/06  -06/26/2006, anteriolisthesis  -01/02/2006, EKG - 1st degree block  -12/19/2005, L4-L5 withearly grade 1  -01/02/2006, Lipid 10/08/06 TC=136, TG=83, HDL=58, LDL=61 -  -10/09/2006, Lumbar x-ray: DDD and facet dz  -01/02/2006, remove  BCC - 11/13/2005  Family History: Last updated: 01/05/2010 Diabetes 1st degree, heart disease`, hypertension  Sister with MI at 27, father with MI at 64  Social History: Last updated: 04/05/2010 On SS disability.  Used to be a  house Education administrator.  Lives alone in a house with a dog.  1 ppd smoker (reg. Flavor), occ.  EtOH (unit 12/2009 drank 2-3 beers per day) No drugs.  Risk Factors: Smoking Status: current (05/03/2010) Packs/Day: 1.5 (05/03/2010)  Social History: Packs/Day:  1.5  Review of Systems General:  Denies chills, fever, and sweats. Eyes:  Denies blurring, discharge, and itching. CV:  Complains of lightheadness; denies bluish discoloration of lips or nails, chest pain or discomfort, difficulty breathing at night, fainting, fatigue, near fainting, and palpitations. Resp:  Denies chest discomfort, chest pain with inspiration,  cough, coughing up blood, shortness of breath, sputum productive, and wheezing.  Physical Exam  General:  Well-developed,well-nourished,in no acute distress; alert,appropriate and cooperative throughout examination. vitals reviewed  Neurologic:  No cranial nerve deficits noted. Station and gait are normal. Plantar reflexes are down-going bilaterally. DTRs are symmetrical throughout. Sensory, motor and coordinative functions appear intact.  Gilberto Better test: + verticle nystagmus b/l   Impression & Recommendations:  Problem # 1:  DIZZINESS (ICD-780.4) Assessment Unchanged Carotid dopler were negative for stenosis.  D Hallpike manuver showed vertical nystagmus.  With normal carotid, myoview, and echo we have ruled out ischemia or stenosis or valvular etiologies for complaint.  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    Orders: Physical Therapy Referral (PT) [facility]- Est Level  3 ([address])  Complete Medication List: 1)  Actos 30 Mg Tabs (Pioglitazone hcl) .... Take 1 tablet by mouth once a day 2)  Metformin Hcl 1000 Mg Tabs (Metformin hcl) .... Take 1 tablet by mouth twice a day 3)  Zocor 40 Mg Tabs (Simvastatin) .... Take 1 tablet by mouth at bedtime 4)  Advair Diskus 500-50 Mcg/dose Misc (Fluticasone-salmeterol) .... One puff twice daily. 5)  Enalapril-hydrochlorothiazide 5-12.5 Mg Tabs (Enalapril-hydrochlorothiazide) .Marland Kitchen.. 1 tab by mouth daily (new dose) 6)  Ventolin Hfa 108 (90 Base) Mcg/act Aers (Albuterol sulfate) .... 2-4 puffs every 4 hours as needed for wheezing and shortness of breath. 7)  Diazepam 10 Mg Tabs (Diazepam) .Marland Kitchen.. 1 tab by mouth qhs as needed sleep 8)  Loratadine 10 Mg Tabs (Loratadine) .Marland Kitchen.. 1 tab by mouth daily 9)  O2  .... At night and in summer 10)  Aspir-low 89 Mg Tbec (Aspirin) .... One tablet daily 11)  Spiriva Handihaler 18 Mcg Caps (Tiotropium bromide monohydrate) .Marland Kitchen.. 1 cap inhaled daily  Patient Instructions: 1)  Please schedule a follow-up appointment in 6  weeks.  Prescriptions: LORATADINE 10 MG TABS (LORATADINE) 1 tab by mouth daily  #30 x 6   Entered and Authorized by:   [name] MD   Signed by:   [name] MD on 05/03/2010   Method used:   Electronically to        [facility]* (retail)       [address] [place], [place]  [address]       Ph: [phone] or [phone]       Fax: [phone]   RxID:   [id number]

## 2010-11-03 NOTE — Progress Notes (Signed)
Summary: ROI  ROI   Imported By: De Nurse 10/13/2010 16:41:37  _____________________________________________________________________  External Attachment:    Type:   Image     Comment:   External Document

## 2010-11-03 NOTE — Assessment & Plan Note (Signed)
Summary: bp & meds/[place]   Vital Signs:  Patient profile:   65 year old male Height:      70 inches Weight:      255 pounds BMI:     36.72 Temp:     97.5 degrees F oral Pulse rate:   73 / minute BP sitting:   154 / 80  (right arm) Cuff size:   large  Vitals Entered By: Tessie Fass CMA (October 21, 2009 2:12 PM)  Serial Vital Signs/Assessments:  Time      Position  BP       Pulse  Resp  Temp     By                     160/82                         Ardeen Garland  MD  CC: F/U BP Is Patient Diabetic? Yes Pain Assessment Patient in pain? no        Primary Care Provider:  Ardeen Garland  MD  CC:  F/U BP.  History of Present Illness: Alex Foster comes int oday to discuss blood pressure.  At his last visit his enalapril was increased from 10 mg ot 20 mg due to his BP not being at goal for several visits.  He reports feeling "drunk" on the increased dose.  States he did take it for several weeks and still felt this way.  Went back down to 10 mg and felt better.  Otherwise doing well and no complaints.   Habits & Providers  Alcohol-Tobacco-Diet     Tobacco Status: current     Cigarette Packs/Day: 1.5  Social History: Packs/Day:  1.5  Physical Exam  General:  obese, alert, NAD.  Vital reviewed. Eyes:  pupils equal, pupils round, and pupils reactive to light.   Lungs:  Normal respiratory effort, chest expands symmetrically. Lungs are clear to auscultation, no crackles or wheezes. Heart:  Normal rate and regular rhythm. S1 and S2 normal without gallop, murmur, click, rub or other extra sounds.   Impression & Recommendations:  Problem # 1:  HYPERTENSION, BENIGN ESSENTIAL, CONTROLLED (ICD-401.1) Assessment Unchanged  He wants to try 15 mg rather than add a second medication at this time.  Also, prefers 10mg  and 5 mg tabs rather than cutting the 10mg  tabs in half for the extra 5 mg.  Not sure if this will be enough.  However, he will try it and see how he feels and return in  March for a recheck.  If he doesn't tolerate 15mg , will go back to 10 and let us know.  At that point would add HCTZ to his regimen to see how he does.  His updated medication list for this problem includes:    Enalapril Maleate 10 Mg Tabs (Enalapril maleate) .Marland Kitchen... 1 tab by mouth daily for blood pressure.  (take with the 5 mg pill)    Enalapril Maleate 5 Mg Tabs (Enalapril maleate) .Marland Kitchen... 1 tab by mouth daily for high blood pressure (take with the 10mg  pill)  Orders: [facility]- Est Level  3 ([address])  Complete Medication List: 1)  Actos 30 Mg Tabs (Pioglitazone hcl) .... Take 1 tablet by mouth once a day 2)  Metformin Hcl 1000 Mg Tabs (Metformin hcl) .... Take 1 tablet by mouth twice a day 3)  Zocor 40 Mg Tabs (Simvastatin) .... Take 1 tablet by mouth at bedtime 4)  Advair  Diskus 500-50 Mcg/dose Misc (Fluticasone-salmeterol) .... One puff twice daily. 5)  Enalapril Maleate 10 Mg Tabs (Enalapril maleate) .Marland Kitchen.. 1 tab by mouth daily for blood pressure.  (take with the 5 mg pill) 6)  Ventolin Hfa 108 (90 Base) Mcg/act Aers (Albuterol sulfate) .... 2-4 puffs every 4 hours as needed for wheezing and shortness of breath. 7)  Diazepam 5 Mg Tabs (Diazepam) .Marland Kitchen.. 1 tab by mouth qhs as needed sleep 8)  Loratadine 10 Mg Tabs (Loratadine) .Marland Kitchen.. 1 tab by mouth daily 9)  Enalapril Maleate 5 Mg Tabs (Enalapril maleate) .Marland Kitchen.. 1 tab by mouth daily for high blood pressure (take with the 10mg  pill)  Patient Instructions: 1)  Start 15 mg of enalapril.  You can take the 10mg  tab and the 5 mg tab at the same time everyday. 2)  Please come back in March for a blood pressure recheck to make sure the new dose is working well enough. 3)  If this dose make you feel poorly too, go back down to 10 mg and let us know.  You may just need to add a second medication instead of increasing the enalapril.  Prescriptions: ENALAPRIL MALEATE 5 MG TABS (ENALAPRIL MALEATE) 1 tab by mouth daily for high blood pressure (take with the 10mg  pill)   #30 x 5   Entered and Authorized by:   Ardeen Garland  MD   Signed by:   Ardeen Garland  MD on 10/21/2009   Method used:   Print then Give to Patient   RxID:   [id number] ENALAPRIL MALEATE 10 MG TABS (ENALAPRIL MALEATE) 1 tab by mouth daily for blood pressure.  (take with the 5 mg pill)  #30 x 5   Entered and Authorized by:   Ardeen Garland  MD   Signed by:   Ardeen Garland  MD on 10/21/2009   Method used:   Print then Give to Patient   RxID:   [id number]

## 2010-11-22 ENCOUNTER — Ambulatory Visit (INDEPENDENT_AMBULATORY_CARE_PROVIDER_SITE_OTHER): Payer: Medicaid Other | Admitting: Family Medicine

## 2010-11-22 ENCOUNTER — Encounter: Payer: Self-pay | Admitting: Family Medicine

## 2010-11-22 VITALS — BP 167/84 | HR 80 | Temp 97.6°F | Wt 276.0 lb

## 2010-11-22 DIAGNOSIS — K5900 Constipation, unspecified: Secondary | ICD-10-CM | POA: Insufficient documentation

## 2010-11-22 DIAGNOSIS — E119 Type 2 diabetes mellitus without complications: Secondary | ICD-10-CM

## 2010-11-22 DIAGNOSIS — I10 Essential (primary) hypertension: Secondary | ICD-10-CM

## 2010-11-22 MED ORDER — LISINOPRIL-HYDROCHLOROTHIAZIDE 10-12.5 MG PO TABS: 1.0000 | ORAL_TABLET | Freq: Every day | ORAL | Status: DC

## 2010-11-22 MED ORDER — DOCUSATE SODIUM 100 MG PO CAPS: 100.0000 mg | ORAL_CAPSULE | Freq: Two times a day (BID) | ORAL | Status: AC

## 2010-11-22 MED ORDER — LISINOPRIL-HYDROCHLOROTHIAZIDE 20-12.5 MG PO TABS: 1.0000 | ORAL_TABLET | Freq: Every day | ORAL | Status: DC

## 2010-11-22 NOTE — Assessment & Plan Note (Signed)
Pt complains of flatulence in AM that is relieved after BM.  He does have constipation sometimes due to percocet that he takes chronically for lbp.  This is prescribed by pain clinic.  Will prescribe Colace 100mg  bid. Pt had colonoscopy 2008 that was normal.

## 2010-11-22 NOTE — Assessment & Plan Note (Signed)
BP 167/84 today, elevated and not at goal of <140/90.  When pt was in clinic in 08/2010 BP was 151/79.  Pt has history of CVA 2005 from HT.  He is taking enalapril 5-HCTZ 12.5mg .  Will change ACE to lisinopril 20 (=enalapril 10mg ) and continue with HCTZ 12.5.  Pt will f/u in 1 month.  If BP still not at goal will increase HCZT to 12.5mg .  Will check renal fxn at next visit as Cr was 1.07 in 08/2010.

## 2010-11-22 NOTE — Patient Instructions (Signed)
Please schedule an appointment in 1 month for blood pressure. Your new blood pressure medication is Lisinopril-Hydrocholorthiaizde 20-12.5mg .  Take one tablet every day. Please take all your other medications as directed. I will call you with A1C result and diabetes med.

## 2010-11-22 NOTE — Progress Notes (Signed)
  Subjective:    Patient ID: Alex Foster, male    DOB: 1946/01/11, 65 y.o.   MRN: [id number]  HPI Flatulence: Present for 3-4 months.  Usually occurs when he first wakes up he has a lot of gas.  After he has a bowel movement, then he would feel better.  BMs are sometimes hard and sometimes soft.  He attributes firm stools to taking Percocet ([facility]).  He has been taking Ex-lax sometimes but this has not helped.  He has not been taking any stool softeners.  Diet: oatmeal, vegetables  DIABETES Meds:Metformin 1000mg  bid (D/C Actos during last visit 08/2010) Compliance: yes    Diet: eating more fruits, some vegetables, oatmeal Lightheadedness:not since med change     Dizziness: sometimes    Confusion: no    Shakiness: no    Abd  pain: no   Nausea: no    Vomiting: no  CBGs: 90s-120s     HYPERTENSION Meds: Enalapril-HCTZ 5-12.5 BP: 150s-160s/70s-80s Chest pain: no   Dypsnea: yes, on O2 prn use, has COPD but cannot wear mask so uses [facility] for sleep   Syncope: no  Palpitiations: no   LE edema: yes  OBESITY BMI:  Weight difference since last OV:  Exercise: s Diet: eating fruits, no sodas, no sweets   Review of Systems     Objective:   Physical Exam  GEN: Well-developed obese in no acute distress, appropriate throughout exam. He smells of cigarette. SHIN:  warm and dry.  HEENT: EOMI, MMM NECK: is supple. No thyromegaly.  RESP: Mild exp wheezing, good air movements CVS: RRR, no murmurs, no bruit  ABD: nontender or distended. No masses palpated. EXT:  +1 edmea, +2 dp pulses Neuro: grossly intact       Assessment & Plan:

## 2010-11-22 NOTE — Assessment & Plan Note (Signed)
Will check A1C today.  Last A1C 08/2010 was 6.2%, at that time pt was taking Actos 30mg  and Metfomrin 1000mg  bid.  We stopped Actos at last visit because pt was concerned regarding cancer that he saw on informercial.  I did not replace Acts with another med as A1C was at goal <7 and will likely be at goal without Actos.  I will call pt with A1C result and new med if needed.  Can add Glipizide if A1C not at goal.

## 2010-11-23 ENCOUNTER — Telehealth: Payer: Self-pay | Admitting: Family Medicine

## 2010-11-23 MED ORDER — DIAZEPAM 10 MG PO TABS: 10.0000 mg | ORAL_TABLET | Freq: Every evening | ORAL | Status: DC | PRN

## 2010-11-23 NOTE — Telephone Encounter (Signed)
Pt seen yesterday, missing rx for valium & also asked for something for his gas.

## 2010-11-23 NOTE — Telephone Encounter (Signed)
To pcp. Will call pt after I hear from md..Alex Foster, Alex Foster

## 2010-11-23 NOTE — Telephone Encounter (Signed)
Spoke with pt.  Agreed to refill diazepam 10mg  qhs x 30 +3 refills.  Discussed treatment for gas with pt. Called diazepam in to [facility].

## 2010-12-21 ENCOUNTER — Ambulatory Visit: Payer: Medicaid Other | Admitting: Family Medicine

## 2011-01-18 ENCOUNTER — Ambulatory Visit: Payer: Medicaid Other | Admitting: Family Medicine

## 2011-01-25 ENCOUNTER — Ambulatory Visit: Payer: Medicaid Other | Admitting: Family Medicine

## 2011-02-14 NOTE — H&P (Signed)
NAMEDEMAR, SHAD NO.:  [id number]   MEDICAL RECORD NO.:  [id number]          PATIENT TYPE:  INP   LOCATION:  3303                         FACILITY:  [facility]   PHYSICIAN:  Nestor Ramp, MD        DATE OF BIRTH:  January 21, 1946   DATE OF ADMISSION:  03/23/2008  DATE OF DISCHARGE:                              HISTORY & PHYSICAL   CHIEF COMPLAINT:  Shortness of breath.   PRIMARY CARE PHYSICIAN:  Payton Emerald, MD, family practice.   HISTORY OF PRESENT ILLNESS:  This is a 65 year old male with COPD who  comes to the emergency department for worsening shortness of breath and  left-sided chest pain.  Chest pain is a dull feeling.  It came on at  rest.  He is not sure what reviewed it.  There is no diaphoresis.  No  nausea or vomiting at time of chest pain.  No radiation to the left side  or face.  He has never had this before.  He is currently without chest  pain.  The patient's shortness of breath has been ongoing for 2 weeks,  but is much worse starting yesterday.  He complains of a productive  cough, although, he says it has not changed from baseline.  He has had  no fevers.  He said he has not been able to sleep in the past 2 weeks  because of worsening shortness of breath.  A fan directly on him helps  with the shortness of breath.  He has also had worsening edema of the  lower extremities bilaterally over the past couple weeks.  Of note, he  has ran out of his albuterol inhaler, though has been taking his Advair  more often and he should have.   PAST MEDICAL HISTORY:  1. Hemorrhagic stroke in 2005.  2. Hyperlipidemia.  3. Xerosis of the foot.  4. Diabetes type 2.  5. COPD, mild.  6. Hypertension.  7. Tobacco abuse.   SOCIAL HISTORY:  The patient no longer works.  He is married.  He drinks  six beers a day smokes about a pack of cigarettes a day.  Denies drug  use.   FAMILY HISTORY:  Diabetes, hypertension, and heart disease; all in first-  degree  relatives.   MEDICATIONS:  1. Actos 30 mg p.o. daily.  2. Enalapril and hydrochlorothiazide 5/12.5 mg p.o. daily.  3. Metformin 1000 mg p.o. b.i.d.  4. Albuterol inhaler p.r.n.  5. Zocor 40 mg p.o. daily.  6. Advair 500/50 mcg one puff b.i.d.   ALLERGIES:  No known drug allergies.  No known food allergies.   REVIEW OF SYSTEMS:  As in HPI with the following addition; denies chills  or fever.  Denies blurry vision.  Denies any ear ache or sore throat.  Denies abdominal pain, bloody stools, constipation, diarrhea, nausea,  and vomiting.  Denies dysuria.  Denies rash.  Denies headaches, tremors,  and weakness.  Denies depression.  Denies bleeding, complains of leg  cramping since being in the emergency room and complains of sleep  problems.   PHYSICAL EXAMINATION:  VITAL SIGNS:  Oxygen saturation ranges from 84-  89% on 4 and 6 L.  Afebrile at 98.2.  His pulse is 102.  His respiratory  rate is 28 and his blood pressure is 137/55.  GENERAL:  He is obese, not in any acute distress, although he does have  some shortness of breath while speaking.  HEENT:  Head normocephalic and atraumatic.  Eyes, pupils are equal,  round, and react to light accommodation.  Extraocular muscles are  intact.  Nose is normal.  Mouth, he has poor dentition and normal mucous  membranes.  NECK:  Supple.  He has neck is supple and he has full range of movement.  LUNGS:  He has poor air movement bilaterally; however, there are no  wheezes and no crackles present.  He has mild increased work of  breathing.  He has no retractions.  HEART:  Regular rate and rhythm.  No rubs, gallops, or murmurs.  ABDOMEN:  Obese, soft, and nontender.  Normal bowel sounds.  MUSCULOSKELETAL:  He has normal range of movement.  No joint swelling.  Pulses, his radial and DP pulses are equal bilaterally.  EXTREMITIES:  He has 2+ pitting edema to his shins.  His extremities  have mild tenderness to palpation bilaterally.  He has faint  erythema in  left lower extremity.  Negative Homans' sign.  NEUROLOGIC:  Alert and oriented x3.  Cranial nerves II through XII  intact.  His strength is normal.  SKIN:  He has faint erythema in left lower extremity, appears chronic.  CERVICAL NODES:  Normal.  PSYCHE:  Normal.   LABORATORY DATA:  Point-of-care cardiac enzymes are negative with a  troponin less than 0.05 and a CK-MB of 3.2.  BMET shows a sodium of 123,  potassium 4.6, chloride of 86, glucose 206, a BUN of 6, creatinine 0.95,  and calcium of 8.2.  BNP and elevated at 108.  CBC shows a white count  of 10.3, hemoglobin 13.9, hematocrit 40.1, platelets 250, neutrophils  78%, and ANC of 8.  Chest x-ray shows decreased aeration in the right  lower lobe, showing atelectasis versus infiltrate.   ASSESSMENT:  A 65 year old male with dyspnea.  1. Shortness of breath, dyspnea:  Differential diagnosis includes a      chronic obstructive pulmonary disease exacerbation, which is most      likely, myocardial infarction, pneumonia, overall worsening chronic      obstructive pulmonary disease, congestive heart failure, or      pulmonary embolism which is least likely.  Given the patient's      productive cough, we will treat for chronic obstructive pulmonary      disease exacerbation with IV or p.o. steroids.  We will continue      Avelox since it has already been given.  We will use albuterol and      Atrovent nebs q.4 and q. 2 p.r.n.  We are going to repeat his chest      x-ray in the morning to see if the pneumonia has fluffed out.  We      use oxygen via nasal cannula, but we may need him to switch to      BiPAP or venti-mask.  The patient is refusing a face mask at this      point due to claustrophobia and may require Ativan if a mask is      needed.  The patient is willing to be intubated if this is indeed  required.  BNP is only mildly elevated and no need for diuresis at      this point.  However, given significant leg  edema, I will get an      echo to assess this ejection fraction.  I will cycle cardiac      enzymes to rule out myocardial infarction, given his left-sided      chest pain.  2. Chronic obstructive pulmonary disease:  See problem #1.  The      patient do not have home O2.  We will continue the patient's Advair      and albuterol.  3. Hyperlipidemia.  Continue Zocor.  4. Diabetes.  Continue his home medications, however, we will hold his      metformin initially in case we need a CT angio to rule out      pulmonary emboli.  5. Hypertension.  Continue the patient's home medications, except hold      his enalapril, so we know he does not require CT angio.  6. Tobacco abuse.  We will offer smoking cessation and nicotine patch,      if he desires.  7. Hyponatremia.  This is a new problem with unclear etiology,      although the patient does endorse a significant alcohol history and      this could be attributed to that.  He is asymptomatic.  We will      hold his hydrochlorothiazide.  Repeated sodium in the morning.  We      will gentle gently hydrate with maintenance IV fluids and normal      saline.  8. Prophylaxis.  We will start on a proton pump inhibitor and Lovenox      for prophylaxis for gentle hydration.      Johney Maine, M.D.  Electronically Signed      Nestor Ramp, MD  Electronically Signed    JT/MEDQ  D:  03/23/2008  T:  03/24/2008  Job:  [id number]

## 2011-02-14 NOTE — Discharge Summary (Signed)
Alex Foster NO.:  [id number]   MEDICAL RECORD NO.:  [id number]          PATIENT TYPE:  INP   LOCATION:  6727                         FACILITY:  [facility]   PHYSICIAN:  Alex Ramp, MD        DATE OF BIRTH:  October 24, 1945   DATE OF ADMISSION:  03/23/2008  DATE OF DISCHARGE:  03/27/2008                               DISCHARGE SUMMARY   PRIMARY CARE Alex Foster:  Alex Garland, MD, new primary care physician for  this patient.   REASON FOR HOSPITALIZATION:  Shortness of breath.   DISCHARGE DIAGNOSES:  1. Bilateral pneumonia  2. Chronic obstructive pulmonary disease exacerbation.  3. History of hemorrhagic stroke in 2005.  4. Hyperlipidemia.  5. Cirrhosis of the foot.  6. Type 2 diabetes.  7. Hypertension.  8. Tobacco abuse  9. Alcohol abuse.   DISCHARGE MEDICATIONS:  1. Lasix 10 mg daily, this is a new medicine  2. Avelox 400 mg p.o. daily for 5 days.  3. Prednisone 60 mg daily for 5 days.  4. Metformin 1000 mg p.o. twice daily.  5. Actos 30 mg p.o. daily.  6. Albuterol 2 puffs every 4 hours as needed number.  7. Zocor 40 mg p.o. daily.  8. Advair 500/50 mcg 1 puff twice daily.  9. Ativan 0.5 mg one p.o. three times daily as needed.   DISCHARGE INSTRUCTIONS:  1. The Patient is to follow up with Dr. Georgiana Foster his new primary care      Alex Foster at [facility] on April 07, 2008 at 3 p.m.  2. The patient is not to smoke while using oxygen at home.  3. The patient is not to mix alcohol with Ativan.  4. The patient is to stop taking hydrochlorothiazide.  5. The patient is to stop taking enalapril.  6. The patient is not to attempt to drive a car or a scooter after      taking Ativan.   CONSULTATIONS:  None.   PROCEDURES:  None.   SIGNIFICANT FINDINGS:  1. Admission workup.  CT angiogram of the chest performed on admission      showed no evidence of pulmonary emboli.  It did show patchy small      pneumonias in both lungs and fairly extensive  bronchiectasis as      well.  Chest x-ray performed on admission showed decreased aeration      to the right lung base possibly due to atelectasis or infiltrate.      Repeat chest x-ray performed the day after admission showed slight      improvement in right lower lobe airspace disease, cardiomegaly, and      COPD.  Admission CBC showed a white blood cell count of 10.3,      hemoglobin of 13.9 with a platelet count of 215, and 78%      neutrophils.  Point-of-care cardiac enzymes had a negative troponin      and negative CKMB.  BNP performed on admission was mildly elevated      108.  Electrolytes performed on admission showed a low sodium at  123, potassium of 4.6, chloride 86, CO2 27, BUN of 6, and      creatinine 0.95.  Blood glucose of 204.  Blood gas performed on      admission showed a pH of 7.45, pCO2 of 46.2, pO2 of 55.  D-dimer      performed on admission was elevated at 1.73.  Liver panel performed      on admission was within normal limits.  Repeat ABG approximately 8      hours after admission to the Emergency Department showed a pH of      7.42, pCO2 of 44.9 and a pO2 of 104.  2. Further inpatient workup.  Basic metabolic panel performed on      hospital day #2 showed a sodium of 121, potassium of 5.0,      creatinine of 1.0.  Repeat BMET on the day prior to discharge      showed a sodium of 131, potassium of 5.2 and a creatinine 1.03.      BMET performed on the day of discharge showed a sodium of 131,      potassium of 4.5, creatinine of 1.1.  Urine culture performed      during this hospitalization showed no growth.  CBC performed 2 days      prior to discharge showed a white blood cell count of 8.6 and      hemoglobin of 12.9.  Cardiac markers were cycled for two sets after      admission and found to be negative for both sequential sets.  The      patient's O2 saturations were noted to be around 88-87% on room      air.  Ambulatory O2 saturations dropped into the mid  80s as well.      Therefore, the patient was started on continuous oxygen therapy      during this hospitalization.  3. Echocardiogram.  Ejection fraction was noted to be 60-70%.  There      was noted to be left ventricular wall thickness that was moderately      increased.  The left atrial size was the upper limits of normal.      Right ventricle was noted to be moderately dilated with right      ventricular hypertrophy present on this exam.   BRIEF HOSPITAL COURSE:  Briefly, the patient is a 65 year old male with  history of hemorrhagic stroke, COPD, and tobacco abuse who presented  with shortness of breath.  1. Shortness of breath.  On presentation with an elevated D-dimer, the      patient's presentation was concerning for possible pulmonary      embolism.  CT angiogram was pursued and found to be negative for      pulmonary emboli, but it did show evidence of bilateral pneumonias.      Chest x-ray performed on admission also suggested pneumonia.      Accordingly, the patient was admitted for pneumonia and possible      COPD exacerbation given his preexisting lung disease.  Accordingly,      the patient was started on a course of oral steroids and Avelox as      an antibiotic to cover both possible aspiration pneumonia and      community-acquired pneumonia pathogens.  On the day after      hospitalization, the patient's O2 saturation were in the high 80s      to low 90s on 4 liters.  Over the  course of his hospitalization,      the patient was able to be weaned down to 1-2 liters of oxygen for      most of the duration of his hospital stay.  On the day prior to      discharge, the patient slept without any oxygen on at night and      oxygen saturations were in the low 90s.  Given the patient's      shortness of breath at baseline, he was evaluated for the need of      possible home oxygen therapy.  The patient's O2 saturations on room      air were noted to be 87-88% while sitting  and in the mid 80s while      ambulating.  Accordingly, the patient was set up to receive home      health oxygen therapy.  The patient was instructed not to smoke      while using oxygen and also not to smoke in the same room where      oxygen is used in the house.  The patient expressed agreement and      understanding with these instructions.  At the time of discharge,      the patient was discharged on five additional days of Avelox to      complete a 10-day course in total.  He is also discharged on      prednisone 60 mg daily to complete a 5-day course as well.  Of      note, the patient was maintained on albuterol and Atrovent      nebulizer treatments throughout the course of his hospital stay.      For approximately 3 days prior to discharge, he was not requiring      any p.r.n. albuterol and Atrovent treatments to maintain his normal      breathing status.  BNP on admission was mildly elevated slightly      above 100.  Please note that the patient had an echocardiogram      performed during this hospitalization, which showed an ejection      fraction of 60-70%.  The patient's left ventricular wall thickness      was moderately increased.  There was an increased relative      contribution of atrial contraction to left ventricular filling.      Left atrial side was at the upper limits of normal.  Right      ventricle was moderately dilated and there was moderate right      ventricular hypertrophy.  2. Hyponatremia.  The patient's sodium on admission was found to be      123.  On repeat, the day after admission, the patient was noted to      be 121 with his sodium.  The patient was begun on normal saline on      admission and his sodium level slowly improved until it was stable      at 131 two days prior to discharge.  On admission, the patient was      discontinued from his hydrochlorothiazide.  It is possible that      this could be contributing to the patient's hyponatremia.   3. Hyperkalemia.  The patient's potassium was noted to be in the      normal range on admission, however, it was slowly increased with      initiation of fluid resuscitation.  Accordingly, the patient was  discontinued on enalapril.  On the day prior to discharge, the      patient's potassium was noted to be elevated at 5.2.  He was kept      an additional day and his potassium responded to the level of 4.5      on the day of discharge.  Additionally, the patient had been      started on oral Lasix 10 mg daily and had been on this medication      for approximately 3 days at the time of discharge.  This medicine      should help facilitate keeping his potassium low.  The patient      should be assessed in the future for possible hypopotassemia and      potassium supplement should be considered as indicated.  4. Diabetes.  The patient's sugars were somewhat increased during this      hospitalization into the low 200s.  It is felt that this was likely      due to his steroid therapy as a treatment for his COPD      exacerbation.  No additional therapy was initiated for these      elevated blood sugars as the patient will be stopped on steroids      after completing a 10-day course.  5. Anxiety.  The patient was noted to be somewhat anxious during this      hospitalization.  Accordingly, he was started on Ativan 0.5 mg p.o.      three times daily as needed for anxiety.  The patient did well with      this therapy.  The patient was advised that this was only temporary      to get him through the stress of this period of health decline.      The patient voiced understanding of this.  The patient was given 40      pills of Ativan to take with him home.  The patient has follow up      with his new primary care Alex Foster Dr. Georgiana Foster at [facility] family      practice on April 07, 2008.  Dr. Georgiana Foster, at her discretion, should      consider whether or not Ativan is indicated for future use in this       patient.  He is 65 years old and there are risk of benzodiazepine      therapy in this patient.  Furthermore, the patient does have a      history of alcohol abuse and benzodiazepine therapy could      potentially be unsafe in this patient.  The patient was instructed      not to use benzodiazepines and alcohol together and expressed good      understanding of this recommendation.  6. Alcohol abuse.  Initially on presentation, the nursing staff was      worried that the patient might be experiencing symptoms of      withdrawal.  CIWA protocol was not initiated on this patient and he      was given Ativan p.r.n. for approximately two doses to help treat      with possible withdrawal symptoms of alcohol.  The patient did well      with this therapy and did not exhibit any further signs of alcohol      withdrawal during his hospitalization.  The patient reports that he      will try to curb his drinking  habits upon discharge and states that      he will not use Ativan and alcohol together at discharge.  7. Tobacco abuse.  The patient does smoke a pack per day.  He was      maintained on nicotine patch during this hospitalization.  The      patient states that he will try to quit smoking at the time of      discharge.  This should be assessed at outpatient followup with Dr.      Georgiana Foster.  Smoking certainly does contribute to the patient's      symptoms and is contributing to his COPD.  Also with the initiation      of home oxygen therapy, the patient would be safer if he did not      smoke at all as accidents could happen with a combination of these      two things.   DISPOSITION:  The patient is discharged to home.   DISCHARGE CONDITION:  Stable.   ISSUES FOR FOLLOWUP:  Please note that the patient lives by himself in  the basement of the house apparently.  The patient does not have any air  conditioning and the issue of possible placement in an assisted living  was brought up.  The  patient adamantly refused placement in assisted  living and instead desiring to return home.  The patient did call his  brother during this hospital stay to see if his brother could help find  a place for him to live in [place], [place] where he has more  family.  The patient believes it is possible that he might move there by  May 02, 2008.  But at this time, he is currently returning to his  normal domicile in [place] prior to admission.  It would be better if  the patient had family and other people close by to help with his  personal care, but he wishes to remain independent at this time and  should be allowed to do so.  The patient's adherence to medication  regimen should be assessed at follow up.  Also, his compliance with  using Ativan appropriately should be assessed as well.  Strong  consideration should be given as to whether or not this medication  should be continued in the future for this patient.  It is noted that  the patient is going through a difficult time right now, living alone,  and having somewhat declined health and so Ativan was indicated to get  him through this.  This will have to be further considered by Dr. Georgiana Foster  in [facility].      Alex Soman, MD  Electronically Signed      Alex Ramp, MD  Electronically Signed    TE/MEDQ  D:  03/27/2008  T:  03/27/2008  Job:  [id number]   cc:   Alex Garland, MD

## 2011-02-17 NOTE — H&P (Signed)
NAMEGAUGE, WINSKI NO.:  [id number]   MEDICAL RECORD NO.:  [id number]                   PATIENT TYPE:  INP   LOCATION:  3013                                 FACILITY:  [facility]   PHYSICIAN:  Billey Gosling, M.D.                 DATE OF BIRTH:  [birth year]   DATE OF ADMISSION:  01/16/2004  DATE OF DISCHARGE:                                HISTORY & PHYSICAL   CHIEF COMPLAINT:  Stroke.   HISTORY OF PRESENT ILLNESS:  This 65 year old white male with diabetes  mellitus on no medications presented to the [facility] emergency room with  complaints of stroke. At 1300 today, the patient was lifting a glass of  soda and noticed his left hand go weak and subsequently noticed his left leg  going numb. He also had slurred speech and denied and facial droop and  states that he does have a slight headache but no blurring vision and no  dysphagia.   REVIEW OF SYSTEMS:  Positive for shortness of breath with exertion, chronic  cough, and numbness and tingling his feet bilaterally as well as polydipsia  but negative for fevers, chills, nausea, vomiting, weight change. No change  in bowels. No chest pain. No dysuria. No melena.   PAST MEDICAL HISTORY:  1. Diabetes mellitus, type 2; diagnosed in 1988 but patient ran out of     medications two weeks later.  2. Hypertension; the patient has never been on medications.   PAST SURGICAL HISTORY:  None.   MEDICATIONS:  None.   ALLERGIES:  No known drug allergies.   FAMILY HISTORY:  Mother deceased secondary to a MI in her 33s. Father  deceased secondary to a MI at the age of 24. Brother with diabetes and  alcoholism. Has one sister with her hypertension.   SOCIAL HISTORY:  The patient is divorced and homeless. Says he sleeps in an  abandoned house. He has four children. He is unemployed. He smokes two packs  per day for 43 years. He drinks a 12 pack of beer per week and denies any  other drug use.   PHYSICAL  EXAMINATION:  VITAL SIGNS:  Temperature 98.3, pulse 84,  respirations 24, blood pressure 160/98, oxygen saturation 98% on room air.  GENERAL:  This is a very disheveled, obese, white male in no acute distress.  He is alert and oriented x3.  HEENT:  Pupils are equal, round, and reactive to light and accommodation.  Extraocular movements intact. Poor dentition. Oropharynx without erythema or  exudate.  NECK:  Supple. No lymphadenopathy. No thyromegaly.  CARDIOVASCULAR:  Regular rate and rhythm with distant S1 and S2.  LUNGS:  With decreased breath sounds bilaterally without wheezes, rales, or  rhonchi.  ABDOMEN:  Obese, reducible umbilical hernia present, nontender,  nondistended, normal active bowel sounds.  EXTREMITIES:  5/5 strength of his right upper and lower extremities, 4/5  strength of  his upper and lower extremities, 1+ lower extremity edema, left  greater than right, and 2+ pedal pulses bilaterally.  NEUROLOGICAL:  Slight left facial droop but cranial nerves II-IV and VI-XII  grossly intact. DTRs 1+ bilaterally and symmetrical. Pronator drift present  on left side and finger to nose test within normal limits with sensation  intact bilaterally.   LABORATORY DATA:  Sodium 136, potassium 3.8, chloride 95, bicarb 35, BUN 3,  creatinine 0.7, glucose 281. AST 30, ALT 36, alkaline phosphatase 93, total  bilirubin 0.9, total protein 6.5, albumin 3.8, calcium 9.1. White blood  count 4.7, hemoglobin 18.9, hematocrit 54.3, platelets 152. INR 1.0, PT  13.0, PTT 28. Chest x-ray shows hyperinflation with chronic scarring but no  acute infiltrates. CT of the head shows an acute intracerebral hemorrhage in  the right external capsule without midline shift.   ASSESSMENT/PLAN:  A 65 year old white male with diabetes and hypertension  presents with abrupt onset of left sided weakness.   1. Intracerebral hemorrhage. Seen on the head CT and patient was discussed     with a neurologist who  recommended blood pressure control around 160/100     and no other intervention at this time. We will have PT/OT evaluate the     patient for possible rehab and will hold all aspirin and NSAIDs. Also     check UDS for other etiologies of hemorrhage.  2. Hypertension. Currently at goal of around 160/100 and will not lower too     abruptly secondary to #1 but will need long term tight control of his     blood pressure.  3. Diabetes mellitus, type 2. Long standing without medications. Will start     Glucophage and will start diabetic teaching. Also consult social worker     for finances. Check hemoglobin A1c to assess control.  4. Tobacco abuse. Discuss cessation especially since problem #1 and will     continue counseling.  5. Probable chronic obstructive pulmonary disease, by exam as well as chest     x-ray. Start Combivent and Flovent for long term management.                                                Billey Gosling, M.D.    AS/MEDQ  D:  01/16/2004  T:  01/18/2004  Job:  [id number]

## 2011-02-17 NOTE — Op Note (Signed)
NAMEREYAN, HELLE              ACCOUNT NO.:  [id number]   MEDICAL RECORD NO.:  [id number]          PATIENT TYPE:  AMB   LOCATION:  ENDO                         FACILITY:  [facility]   PHYSICIAN:  James L. Malon Kindle., M.D.DATE OF BIRTH:  1946-09-12   DATE OF PROCEDURE:  12/19/2006  DATE OF DISCHARGE:                               OPERATIVE REPORT   PROCEDURE:  Colonoscopy.   MEDICATIONS:  Fentanyl 100 mcg, Versed 10 mg IV.   INSTRUMENT USED:  Pentax pediatric scope changing to the adult scope.   INDICATIONS:  Colon cancer screening.   DESCRIPTION OF PROCEDURE:  The procedure had been explained to the  patient and consent obtained.  In the left lateral decubitus position,  the pediatric scope was inserted.  We advanced the full extent of the  scope and could not advance further.  He had a large doughy abdomen.  He  was moved in multiple positions.  The scope was slowly withdrawn.  No  polyps were seen.  I felt like I got to the hepatic flexure.  We then  inserted the Pentax adult scope and advanced this. In the left lateral  supine and right lateral decubitus position, we were able to get down to  the ascending colon.  I could see the ileocecal valve in distance, I  could not intubate the cecum which was not seen.  The scope was  withdrawn.  The distal ascending colon, transverse, descending, and  sigmoid were normal.  No polyps, diverticular disease, or any other  lesions were seen.  The rectum was free of polyps, as well.  The scope  was withdrawn.  The patient tolerated the procedure well.  There were no  immediate problems.   ASSESSMENT:  Normal screening colonoscopy to the proximal ascending  colon, V76.51.   PLAN:  We will see him back in the office in two months, probably  recheck his stool, and decide if further testing needs to be done.           ______________________________  Llana Aliment. Malon Kindle., M.D.     Waldron Session  D:  12/19/2006  T:  12/19/2006  Job:   [id number]   cc:   Towana Badger, M.D.

## 2011-02-17 NOTE — Discharge Summary (Signed)
Alex Foster, MAUL NO.:  [id number]   MEDICAL RECORD NO.:  [id number]                   PATIENT TYPE:  INP   LOCATION:  3013                                 FACILITY:  [facility]   PHYSICIAN:  Pearlean Brownie, M.D.            DATE OF BIRTH:  03-16-1946   DATE OF ADMISSION:  01/16/2004  DATE OF DISCHARGE:  01/21/2004                                 DISCHARGE SUMMARY   DISCHARGE DIAGNOSES:  1. Intracerebral hemorrhage.  2. Hypertension.  3. Diabetes mellitus type 2.  4. Chronic obstructive pulmonary disease.  5. Tobacco abuse.   DISCHARGE MEDICATIONS:  1. Nicotine patch 21 mcg one patch daily.  Remove old patch.  2. Advair 250/50 one puff b.i.d.  3. Glucophage 500 mg two tablets b.i.d.   DISPOSITION:  Discharged to the care of his niece.   DISCHARGE INSTRUCTIONS:  Activity is as described by his physical therapist.  He will require some supervision with his activities of daily living.  Diabetic diet.  Followup will be with [facility] and the diabetes  education center.  He will call back to the social worker __________to have  these appointments set up.   PROCEDURES PERFORMED:  Head CT which showed an acute intracerebral  hemorrhage in the right external capsule and a portable chest x-ray showing  chronic findings of increased markings, some old healed rib fractures, but  no acute disease.   CONSULTATIONS:  Care management, physical therapy, occupational therapy,  speech therapy.   ADMISSION HISTORY AND PHYSICAL:  This is a 65 year old white male with  diabetes mellitus on no medications who presented to the [facility]  Emergency Room with complaints of stroke.  At 1 p.m. the day of admission  the patient was lifting a glass of food and noticed his left hand went weak  and subsequently noticed his left leg went numb.  He also had slurred speech  and facial droop and states he had a slight headache, but no blurred vision  or dysphagia.   Please see the dictated H&P for full history and physical.   HOSPITAL COURSE:  1. Intracerebral hemorrhage.  The patient underwent speech therapy, physical     therapy and occupational therapy.  He was found to have no swallowing     deficits.  Physical therapy and occupational therapy recommended some     maneuvers for him when he was discharged from the hospital, but no home     health PT or OT.  He did not develop any further worsening of symptoms.     His blood pressure initially was somewhat elevated, but came down to the     130 systolic range with no medications.   1. Hypertension.  As stated previously, the patient's blood pressure came     down to the 130 range on its own.  We did not attempt to reduce his blood     pressure as  a consult with neuro was obtained and they stated his blood     pressure should not be dropped dramatically but be allowed to stay in the     160 rate range systolic, but no higher and it never went up higher than     that.  If he continues to have elevated blood pressures outside of the     hospital it is suggested that his primary care doctor begin him on     medication.   1. Diabetes mellitus type 2.  The patient was started on a diabetic diet.     He was also started no Glucophage 500 b.i.d. and sliding scale insulin     with only marginal control during this hospitalization.  Hemoglobin A1c     obtained at the time of admission was 11.7 and he has clearly had     horrible control over the past several years.  He was subsequently     increased to 1000 mg of Glucophage b.i.d. and he will be followed at the     [facility] for teaching after discharge.   1. Chronic obstructive pulmonary disease.  The patient was initially started     on Combivent, but was changed to Advair as a long acting bronchodilator.     He did not suffer any acute exacerbation during this hospitalization.   1. Tobacco abuse.  The patient did express a desire to  quit smoking and he     was started on a nicotine patch with good control of his withdrawal     symptoms.  This can be continued as an outpatient.  The patient was     discharged to the care of his niece in improved condition with previously     mentioned followup.      Ursula Beath, MD                     Pearlean Brownie, M.D.    JT/MEDQ  D:  01/22/2004  T:  01/24/2004  Job:  [id number]   cc:   [facility]

## 2011-02-21 ENCOUNTER — Ambulatory Visit: Payer: Medicaid Other | Admitting: Family Medicine

## 2011-02-21 ENCOUNTER — Encounter: Payer: Self-pay | Admitting: Family Medicine

## 2011-02-21 ENCOUNTER — Ambulatory Visit (INDEPENDENT_AMBULATORY_CARE_PROVIDER_SITE_OTHER): Payer: Medicaid Other | Admitting: Family Medicine

## 2011-02-21 DIAGNOSIS — I10 Essential (primary) hypertension: Secondary | ICD-10-CM

## 2011-02-21 DIAGNOSIS — M25569 Pain in unspecified knee: Secondary | ICD-10-CM

## 2011-02-21 DIAGNOSIS — H8110 Benign paroxysmal vertigo, unspecified ear: Secondary | ICD-10-CM

## 2011-02-21 DIAGNOSIS — M25562 Pain in left knee: Secondary | ICD-10-CM

## 2011-02-21 DIAGNOSIS — E119 Type 2 diabetes mellitus without complications: Secondary | ICD-10-CM

## 2011-02-21 LAB — BASIC METABOLIC PANEL
BUN: 14 mg/dL (ref 6–23)
CO2: 31 mEq/L (ref 19–32)
Chloride: 96 mEq/L (ref 96–112)
Creat: 1.21 mg/dL (ref 0.40–1.50)

## 2011-02-21 MED ORDER — CARVEDILOL 3.125 MG PO TABS: 3.1250 mg | ORAL_TABLET | Freq: Two times a day (BID) | ORAL | Status: DC

## 2011-02-21 NOTE — Patient Instructions (Signed)
Please follow up in 4 wks for blood pressure.  New medicine for blood pressure: Coreg 3.125mg  twice a day. We will refer you to physical therapy for dizziness. Please get xray of your left knee.

## 2011-02-21 NOTE — Progress Notes (Signed)
  Subjective:    Patient ID: Alex Foster, male    DOB: April 16, 1946, 65 y.o.   MRN: [id number]  HPI Pt with history of DM, HTN, HLD, COPD is here for f/u of diabetes.  Dizziness x 3-4 wks Describes as staggering.  Feels like he is veering off a straight path when he is walking.  Left or right Numbness: no  Weakness:no  Checking CBG: no checking during these episodes  Chest pain: no  Dyspnea:no Having symptoms 3-4 times per day, everyday Pt has transportation: minimal.  His niece would have to take him.  Pt feels unbalanced when he rides his motorized scooter and so he is not riding it as often.   Diabetes: Takes Metformin 1000mg  bid Compliance: yes Diet: not limiting fatty foods.  States that his niece's husband cooks and he also has diabetes so he thinks the foods he cooks must be healthier.  Pt not eating vegetables because he gets $125 from Disability and if he eats "what I want him to eat, then it would last me only 3 weeks."   Chest pain: no  Lightheadedness: no  Dyspnea: sometimes, chronic from smoking   Syncope: no   Abd pain: no  Prob taking meds:no   Checking CBGs: no  L knee pain: He injured it 1999 after falling.  A few days ago the porch broke and his left leg was hyperextended when he tried to catch his balance.  Since then it has been painful.  Pt states he was told years ago that he will need surgery on this knee but he did not want surgery.  He would like PT referral, but states that he cannot drive himself to appts.  He would have to depend on his niece.  Pt does not want to take the bus. +swelling. -locking. -falling.  +tenderness with extension.  -tenderness walking up stairs.    Obesity: Not exercising.  No sweets.  Uses artificial sugar.  Lives with niece and her husband cooks their food.   He states   Tobacco use: yes, about 1 1/2 -2 ppd.  No desire to quit. States the has cut down.   Pmhx, Pshx, Fhx, Shx reviewed    Review of Systems Per hpi     Objective:   Physical Exam  Constitutional: He is oriented to person, place, and time. He appears well-developed and well-nourished. No distress.  Cardiovascular: Normal rate, regular rhythm and normal heart sounds.   No murmur heard. Pulmonary/Chest: Effort normal. No respiratory distress. He has wheezes.  Abdominal: Soft. Bowel sounds are normal. He exhibits no distension. There is no tenderness.  Musculoskeletal:       Trace edema b/l in LE  L knee: +swelling on exam. -deformity.  -redness. -effusion. -joint line tenderness.  +crepitus.  -anterior and posterior drawer sign. Pivoting does not cause pain.  Neurological: He is alert and oriented to person, place, and time. He has normal reflexes.       -Hallpike manuever          Assessment & Plan:

## 2011-02-22 MED ORDER — GLIPIZIDE 5 MG PO TABS: 5.0000 mg | ORAL_TABLET | Freq: Two times a day (BID) | ORAL | Status: DC

## 2011-02-22 NOTE — Assessment & Plan Note (Addendum)
BP 160s/70s and not at goal.  His BP has been trending upwards in the past couple of visit.s  Will add coreg 3.125mg  bid.  He may benefit from having BB on board also. Pt to f/u in 3-4 wks for blood pressure monitor.

## 2011-02-22 NOTE — Assessment & Plan Note (Addendum)
Pt has been complaining of intermittent dizziness x years.  It improved at last visit in Feb, but seems to have recurred.  Pt had -Hallpike. Review of meds do not show cause of dizziness.  Pt has refused head MRI and carotid u/s in the past.  Will refer to Vestibular clinic.

## 2011-02-22 NOTE — Assessment & Plan Note (Addendum)
A1C 7.2 and has been worsening since last visit in Feb.  It may be from the weight gain >20 lbs since 12/2009 and more insulin resistance.  He is taking Metformin 1000mg  bid.  Will add Glipizide 5mg  po bid with meals and recheck.  We may need to increase glipizide or add another agent.

## 2011-02-22 NOTE — Assessment & Plan Note (Signed)
Pt endorsed L knee injury in the 1990s and has had knee pain since balancing himself from falling when the porch broke a few days ago.  Exam showed some swelling and minimal tenderness on hyperextension.  Will get xray.  Pt has oxycodone (from different facility)for pain.

## 2011-02-23 ENCOUNTER — Telehealth: Payer: Self-pay | Admitting: *Deleted

## 2011-02-23 NOTE — Telephone Encounter (Signed)
Faxed referral to vestibular clinic.Busick, Rodena Medin

## 2011-02-26 ENCOUNTER — Emergency Department (HOSPITAL_COMMUNITY): Payer: Medicaid Other

## 2011-02-26 ENCOUNTER — Encounter: Payer: Self-pay | Admitting: Family Medicine

## 2011-02-26 ENCOUNTER — Inpatient Hospital Stay (HOSPITAL_COMMUNITY)
Admission: EM | Admit: 2011-02-26 | Discharge: 2011-03-02 | DRG: 947 | Disposition: A | Discharge status: Home Health | Payer: Medicaid Other | Attending: Family Medicine | Admitting: Family Medicine

## 2011-02-26 DIAGNOSIS — E873 Alkalosis: Secondary | ICD-10-CM | POA: Diagnosis present

## 2011-02-26 DIAGNOSIS — M545 Low back pain, unspecified: Secondary | ICD-10-CM | POA: Diagnosis present

## 2011-02-26 DIAGNOSIS — J4489 Other specified chronic obstructive pulmonary disease: Secondary | ICD-10-CM | POA: Diagnosis present

## 2011-02-26 DIAGNOSIS — Z8673 Personal history of transient ischemic attack (TIA), and cerebral infarction without residual deficits: Secondary | ICD-10-CM

## 2011-02-26 DIAGNOSIS — Z7982 Long term (current) use of aspirin: Secondary | ICD-10-CM

## 2011-02-26 DIAGNOSIS — E785 Hyperlipidemia, unspecified: Secondary | ICD-10-CM | POA: Diagnosis present

## 2011-02-26 DIAGNOSIS — R4182 Altered mental status, unspecified: Secondary | ICD-10-CM

## 2011-02-26 DIAGNOSIS — E872 Acidosis, unspecified: Secondary | ICD-10-CM | POA: Diagnosis present

## 2011-02-26 DIAGNOSIS — E119 Type 2 diabetes mellitus without complications: Secondary | ICD-10-CM | POA: Diagnosis present

## 2011-02-26 DIAGNOSIS — K7290 Hepatic failure, unspecified without coma: Secondary | ICD-10-CM | POA: Diagnosis present

## 2011-02-26 DIAGNOSIS — D696 Thrombocytopenia, unspecified: Secondary | ICD-10-CM | POA: Diagnosis present

## 2011-02-26 DIAGNOSIS — D6949 Other primary thrombocytopenia: Secondary | ICD-10-CM

## 2011-02-26 DIAGNOSIS — I10 Essential (primary) hypertension: Secondary | ICD-10-CM | POA: Diagnosis present

## 2011-02-26 DIAGNOSIS — R609 Edema, unspecified: Secondary | ICD-10-CM

## 2011-02-26 DIAGNOSIS — K7682 Hepatic encephalopathy: Secondary | ICD-10-CM | POA: Diagnosis present

## 2011-02-26 DIAGNOSIS — J449 Chronic obstructive pulmonary disease, unspecified: Secondary | ICD-10-CM | POA: Diagnosis present

## 2011-02-26 DIAGNOSIS — Z79899 Other long term (current) drug therapy: Secondary | ICD-10-CM

## 2011-02-26 DIAGNOSIS — E871 Hypo-osmolality and hyponatremia: Secondary | ICD-10-CM | POA: Diagnosis present

## 2011-02-26 DIAGNOSIS — G4733 Obstructive sleep apnea (adult) (pediatric): Secondary | ICD-10-CM | POA: Diagnosis present

## 2011-02-26 DIAGNOSIS — R0902 Hypoxemia: Secondary | ICD-10-CM | POA: Diagnosis present

## 2011-02-26 LAB — PROTIME-INR
INR: 1.08 (ref 0.00–1.49)
Prothrombin Time: 14.2 seconds (ref 11.6–15.2)

## 2011-02-26 LAB — COMPREHENSIVE METABOLIC PANEL
AST: 23 U/L (ref 0–37)
Albumin: 3.4 g/dL — ABNORMAL LOW (ref 3.5–5.2)
BUN: 18 mg/dL (ref 6–23)
Calcium: 8.1 mg/dL — ABNORMAL LOW (ref 8.4–10.5)
Creatinine, Ser: 1.03 mg/dL (ref 0.4–1.5)
GFR calc Af Amer: >60 mL/min (ref >60)
Total Protein: 6.3 g/dL (ref 6.0–8.3)

## 2011-02-26 LAB — URINALYSIS, ROUTINE W REFLEX MICROSCOPIC
Bilirubin Urine: NEGATIVE
Glucose, UA: NEGATIVE mg/dL
Ketones, ur: NEGATIVE mg/dL
Nitrite: NEGATIVE
Specific Gravity, Urine: 1.011 (ref 1.005–1.030)
pH: 6 (ref 5.0–8.0)

## 2011-02-26 LAB — LIPID PANEL
HDL: 34 mg/dL — ABNORMAL LOW (ref >39)
Triglycerides: 102 mg/dL (ref <150)
VLDL: 20 mg/dL (ref 0–40)

## 2011-02-26 LAB — ETHANOL: Alcohol, Ethyl (B): <11 mg/dL — ABNORMAL HIGH (ref 0–10)

## 2011-02-26 LAB — CBC
Hemoglobin: 12.9 g/dL — ABNORMAL LOW (ref 13.0–17.0)
MCH: 23.8 pg — ABNORMAL LOW (ref 26.0–34.0)
MCHC: 29.2 g/dL — ABNORMAL LOW (ref 30.0–36.0)
Platelets: 48 10*3/uL — ABNORMAL LOW (ref 150–400)
RDW: 18 % — ABNORMAL HIGH (ref 11.5–15.5)

## 2011-02-26 LAB — CK TOTAL AND CKMB (NOT AT ARMC)
CK, MB: 4.2 ng/mL — ABNORMAL HIGH (ref 0.3–4.0)
Relative Index: INVALID (ref 0.0–2.5)

## 2011-02-26 LAB — POCT I-STAT 3, ART BLOOD GAS (G3+)
Acid-Base Excess: 5 mmol/L — ABNORMAL HIGH (ref 0.0–2.0)
Bicarbonate: 39.3 mEq/L — ABNORMAL HIGH (ref 20.0–24.0)
O2 Saturation: 85 %
pCO2 arterial: 107.9 mmHg (ref 35.0–45.0)
pO2, Arterial: 68 mmHg — ABNORMAL LOW (ref 80.0–100.0)

## 2011-02-26 LAB — RAPID URINE DRUG SCREEN, HOSP PERFORMED
Benzodiazepines: POSITIVE — AB
Cocaine: NOT DETECTED
Opiates: NOT DETECTED

## 2011-02-26 LAB — PRO B NATRIURETIC PEPTIDE: Pro B Natriuretic peptide (BNP): 2670 pg/mL — ABNORMAL HIGH (ref 0–125)

## 2011-02-26 LAB — SALICYLATE LEVEL: Salicylate Lvl: 8.8 mg/dL (ref 2.8–20.0)

## 2011-02-26 NOTE — Progress Notes (Signed)
Family Medicine Teaching [facility] Admission History and Physical  Patient name: Alex Foster Medical record number: [id number] Date of birth: [birth year] Age: 65 y.o. Gender: male  Primary Care Provider: [name], MD  Chief Complaint: Anasarca, AMS History of Present Illness: Alex Foster is a 65 y.o. year old male presenting with CHF exacerbation. Pt presented to ED via EMS with concern for stroke and anasarca. Level 5 caveat secondary to confusion and dysarthria. Pt states that he has noticed progressive edema over the last 2 weeks with worsening orthopnea and DOE. No CP. + Nausea. Per EMS report, pt has been taking 2-3 servings of sodium bicarbonate daily over the last 2 weeks for reflux. Pt states that he felt that he may have had a stroke. Pt denies any focal neurological deficits. No recent infections. Pt unsure as to why he thinks that he may have had a stroke. In ED pt noted to have BNP >2600 as well as negative head CT.   Patient Active Problem List  Diagnoses  . DIABETES MELLITUS, TYPE II, WITHOUT COMPLICATIONS  . HYPERLIPIDEMIA, CONTROLLED  . TOBACCO ABUSE  . INSOMNIA, CHRONIC  . BENIGN POSITIONAL VERTIGO  . HYPERTENSION, BENIGN ESSENTIAL, CONTROLLED  . RBBB  . COPD  . OTHER DISORDERS OF BONE AND CARTILAGE OTHER  . Constipation  . Knee pain, left   Past Medical History: Past Medical History  Diagnosis Date  . Stroke 01/2004    Intracebral hemorrhage in the setting of HTN  . Hyperlipidemia   . Hypertension   . Diabetes mellitus   . COPD (chronic obstructive pulmonary disease)     Active smoker, has oxygen for nightime use.  Cannot tolerate using mask.  . Low back pain     Goes to [facility]    Past Surgical History: Past Surgical History  Procedure Date  . Transthoracic echocardiogram 01/2010    EF 60%, mild LVH, mild RV dilation with normal systolic function  . Nm myoview ltd 01/2010    Lexixan myoview: EF 67%, no ischemia or infarction  . Abi  01/2006    0.93 Normal  . Anteriolisthesis 06/26/2006  . Electrocardiogram 01/02/2006    1st degree block    Social History: History   Social History  . Marital Status: Divorced    Spouse Name: N/A    Number of Children: N/A  . Years of Education: N/A   Social History Main Topics  . Smoking status: Current Everyday Smoker -- 1.5 packs/day for 52 years    Types: Cigarettes  . Smokeless tobacco: Not on file   Comment: unable to quitt.   . Alcohol Use: No  . Drug Use: No  . Sexually Active: Not on file   Other Topics Concern  . Not on file   Social History Narrative  . No narrative on file    Family History: Family History  Problem Relation Age of Onset  . Heart disease Father   . Heart disease Sister     Allergies: Allergies  Allergen Reactions  . Penicillins     Current Outpatient Prescriptions  Medication Sig Dispense Refill  . albuterol (VENTOLIN HFA) 108 (90 BASE) MCG/ACT inhaler 2-4 puffs every 4 hours as needed for wheezing and shortness of breath.       Marland Kitchen aspirin 81 MG EC tablet Take 81 mg by mouth daily.        . carvedilol (COREG) 3.125 MG tablet Take 1 tablet (3.125 mg total) by mouth 2 (two) times daily.  60 tablet  11  . diazepam (VALIUM) 10 MG tablet Take 1 tablet (10 mg total) by mouth at bedtime as needed. For sleep  30 tablet  3  . Fluticasone-Salmeterol (ADVAIR DISKUS) 500-50 MCG/DOSE AEPB Inhale 1 puff into the lungs 2 (two) times daily.        Marland Kitchen glipiZIDE (GLUCOTROL) 5 MG tablet Take 1 tablet (5 mg total) by mouth 2 (two) times daily before a meal.  60 tablet  11  . lisinopril-hydrochlorothiazide (ZESTORETIC) 20-12.5 MG per tablet Take 1 tablet by mouth daily.  30 tablet  11  . loratadine (CLARITIN) 10 MG tablet Take 10 mg by mouth daily.        . metFORMIN (GLUCOPHAGE) 1000 MG tablet Take 1,000 mg by mouth 2 (two) times daily.        Marland Kitchen oxyCODONE-acetaminophen (PERCOCET) 10-325 MG per tablet Take 1 tablet by mouth every 4 (four) hours as needed.  Per [facility]       . simvastatin (ZOCOR) 40 MG tablet Take 40 mg by mouth at bedtime.        Marland Kitchen tiotropium (SPIRIVA) 18 MCG inhalation capsule 18 mcg. 1 cap inhaled daily        Review Of Systems: Per HPI with the following additions: no fevers, chills, diarrhea, constipation, chest pain, emesis.  Otherwise 12 point review of systems was performed and was unremarkable.  Physical Exam: Pulse: 85-92  Blood Pressure: 134-144/67-69 RR: 22-24   O2: 92% on 4L  Temp:98.0 General: morbidly obese, slowed mentation and mild increased WOB, disheveled appearing  HEENT: EOMI, no scleral icterus, +scleral edema, large neck girth, poor dentition Heart: Distant heart sounds, no murmurs auscultated Lungs: distant breath sounds diffusely, no wheezes, rales, rhonciii Abdomen: morbidly obese abdomen, + central umbilical hernia (reducible, non ulcerated),  Extremities: + 3-4 edema in LEs bilaterally extending to distal thighs  Skin:no rashes, no ecchymoses, no petechiae Neurology: oriented x2, dysarthric, + mild R sided facial droop, L handed;  no focal neurological deficits  Labs and Imaging: Lab Results  Component Value Date/Time   NA 133* 02/26/2011  2:43 PM   K 4.8 02/26/2011  2:43 PM   CL 95* 02/26/2011  2:43 PM   CO2 34* 02/26/2011  2:43 PM   BUN 18 02/26/2011  2:43 PM   CREATININE 1.03 02/26/2011  2:43 PM   CREATININE 1.21 02/21/2011  4:03 PM   GLUCOSE 116* 02/26/2011  2:43 PM   Lab Results  Component Value Date   WBC 5.7 02/26/2011   HGB 12.9* 02/26/2011   HCT 44.2 02/26/2011   MCV 81.5 02/26/2011   PLT 48 SPECIMEN CHECKED FOR CLOTS PLATELET COUNT CONFIRMED BY SMEAR* 02/26/2011   BNP - 2700 Trop I: <0.30 INR 1.08 UA: WNL CXR-Cardiomegaly with pulmonary vascular congestion.  COPD. Head CT- No acute intracranial abnormalities.  Chronic small vessel ischemic  disease.   Assessment and Plan: Alex Foster is a 65 y.o. year old male presenting with anasarca and AMS AMS- Multifactorial with  likely contribution of chronic diazepam use, volume overload, COPD, poorly treated sleep apnea (secondary to nocompliance), morbid obesity and questionable contribution of ETOH in setting of baseline abuse. Head CT negative for any acute intracranial abnormalities.  Will check ABG. Will check UDS and ETOH level. Will hold diazepam. Will place on CIWA protocol. Unclear if this is the pt's baseline. Will reassess in am.  Anasarca- Likely secondary to exceedingly high sodium intake over the last 2 weeks. Will diurese pt with  IV torsemide. Will place sodium and fluid restrictions. Will check BNP. Will hold HCTZ and mobic in setting of diuresis as to avoid secondary kidney injury. Will check 2D ECHO to assess ventricular function. Will also check TSH Hypoxia- Pt reported to be on home O2 intermittently in the past. There is likely contribution of volume overload in this picture as well. Will check ABG. Supplemental O2 prn. Nightly CPAP as pt can tolerate.  Hyponatremia- Likely secondary to CHF exacerbation and concurrent thiazide use. Holding thiazide.  Will reassess in am after some diuresis. Metabolic Acidosis- Likely secondary to excessive sodium bicarbonate intake and likely underlying respiratory disease. Will check ABG. Will hold on any additional sodium bicarbonate intake. Pt not on loop diuretic prior to admission.  Thrombocytopenia- Of unclear etiology. Question dilutional effect of volume overload. Will check peripheral smear to assess for consumptive process. No signs of active bleeding on exam. No noted purpura. No history of HIT.  HTN-BPs stable. Continue home coreg and lisinopril.   DM- Sensitive SSI. A1C. Wil hold metformin and glipizide.  Hx/o ETOH abuse: Will check RUQ u/s for eval of possible cirrhotic disease. Serum alcohol level/LFTs WNL which is reassuring. WIll placeon CIWA protocol.  HLD-COnt statin.  COPD- Cont home albuterol.  Psych-Cont home wellbutrin.  FEN/GI:NS @ KVO, 1gm sodium  diet. 1.5L fluid restriction.  Prophylaxis: Heparin Disposition: Pending further evaluation

## 2011-02-27 ENCOUNTER — Inpatient Hospital Stay (HOSPITAL_COMMUNITY): Payer: Medicaid Other

## 2011-02-27 DIAGNOSIS — I509 Heart failure, unspecified: Secondary | ICD-10-CM

## 2011-02-27 LAB — MRSA PCR SCREENING: MRSA by PCR: NEGATIVE

## 2011-02-27 LAB — CARDIAC PANEL(CRET KIN+CKTOT+MB+TROPI)
CK, MB: 3.4 ng/mL (ref 0.3–4.0)
Relative Index: INVALID (ref 0.0–2.5)
Total CK: 42 U/L (ref 7–232)

## 2011-02-27 LAB — BLOOD GAS, ARTERIAL
Acid-Base Excess: 12.1 mmol/L — ABNORMAL HIGH (ref 0.0–2.0)
Acid-Base Excess: 17.3 mmol/L — ABNORMAL HIGH (ref 0.0–2.0)
Bicarbonate: 39.7 mEq/L — ABNORMAL HIGH (ref 20.0–24.0)
Drawn by: 30575
Drawn by: 319961
Inspiratory PAP: 6
Inspiratory PAP: 12
Mode: POSITIVE
O2 Saturation: 88.9 %
O2 Saturation: 92.4 %
Patient temperature: 98.6
RATE: 10 resp/min
TCO2: 47.2 mmol/L (ref 0–100)
pO2, Arterial: 61.1 mmHg — ABNORMAL LOW (ref 80.0–100.0)
pO2, Arterial: 66.8 mmHg — ABNORMAL LOW (ref 80.0–100.0)

## 2011-02-27 LAB — COMPREHENSIVE METABOLIC PANEL
Albumin: 3.3 g/dL — ABNORMAL LOW (ref 3.5–5.2)
BUN: 14 mg/dL (ref 6–23)
Calcium: 8.5 mg/dL (ref 8.4–10.5)
Chloride: 94 mEq/L — ABNORMAL LOW (ref 96–112)
Creatinine, Ser: 0.91 mg/dL (ref 0.4–1.5)
GFR calc Af Amer: >60 mL/min (ref >60)
Total Bilirubin: 0.6 mg/dL (ref 0.3–1.2)

## 2011-02-27 LAB — CBC
MCHC: 28.3 g/dL — ABNORMAL LOW (ref 30.0–36.0)
MCV: 83.1 fL (ref 78.0–100.0)
Platelets: 42 10*3/uL — ABNORMAL LOW (ref 150–400)
Platelets: 45 10*3/uL — ABNORMAL LOW (ref 150–400)
RDW: 18.2 % — ABNORMAL HIGH (ref 11.5–15.5)
RDW: 18.3 % — ABNORMAL HIGH (ref 11.5–15.5)
WBC: 5.7 10*3/uL (ref 4.0–10.5)
WBC: 4.4 10*3/uL (ref 4.0–10.5)

## 2011-02-27 LAB — SAVE SMEAR

## 2011-02-27 LAB — POCT I-STAT 3, ART BLOOD GAS (G3+)
Bicarbonate: 39.5 mEq/L — ABNORMAL HIGH (ref 20.0–24.0)
O2 Saturation: 80 %
TCO2: 43 mmol/L (ref 0–100)
pCO2 arterial: 103.7 mmHg (ref 35.0–45.0)
pCO2 arterial: 97.5 mmHg (ref 35.0–45.0)
pH, Arterial: 7.226 — ABNORMAL LOW (ref 7.350–7.450)
pO2, Arterial: 57 mmHg — ABNORMAL LOW (ref 80.0–100.0)

## 2011-02-27 LAB — TSH: TSH: 2.105 u[IU]/mL (ref 0.350–4.500)

## 2011-02-27 LAB — URINE CULTURE

## 2011-02-27 LAB — GLUCOSE, CAPILLARY
Glucose-Capillary: 132 mg/dL — ABNORMAL HIGH (ref 70–99)
Glucose-Capillary: 124 mg/dL — ABNORMAL HIGH (ref 70–99)
Glucose-Capillary: 198 mg/dL — ABNORMAL HIGH (ref 70–99)

## 2011-02-27 LAB — HEMOGLOBIN A1C: Hgb A1c MFr Bld: 7.1 % — ABNORMAL HIGH (ref <5.7)

## 2011-02-27 LAB — TECHNOLOGIST SMEAR REVIEW

## 2011-02-28 LAB — BLOOD GAS, ARTERIAL
Delivery systems: POSITIVE
Drawn by: 31276
Expiratory PAP: 6
Inspiratory PAP: 12
pCO2 arterial: 84.7 mmHg (ref 35.0–45.0)
pH, Arterial: 7.349 — ABNORMAL LOW (ref 7.350–7.450)
pO2, Arterial: 65.2 mmHg — ABNORMAL LOW (ref 80.0–100.0)

## 2011-02-28 LAB — COMPREHENSIVE METABOLIC PANEL
AST: 17 U/L (ref 0–37)
Albumin: 3.3 g/dL — ABNORMAL LOW (ref 3.5–5.2)
Alkaline Phosphatase: 82 U/L (ref 39–117)
Chloride: 88 mEq/L — ABNORMAL LOW (ref 96–112)
Creatinine, Ser: 0.87 mg/dL (ref 0.4–1.5)
GFR calc Af Amer: >60 mL/min (ref >60)
Potassium: 3.7 mEq/L (ref 3.5–5.1)
Total Bilirubin: 0.7 mg/dL (ref 0.3–1.2)
Total Protein: 6.1 g/dL (ref 6.0–8.3)

## 2011-02-28 LAB — CBC
Platelets: 44 10*3/uL — ABNORMAL LOW (ref 150–400)
RBC: 5.12 MIL/uL (ref 4.22–5.81)
RDW: 18.1 % — ABNORMAL HIGH (ref 11.5–15.5)
WBC: 5 10*3/uL (ref 4.0–10.5)

## 2011-02-28 LAB — GLUCOSE, CAPILLARY
Glucose-Capillary: 184 mg/dL — ABNORMAL HIGH (ref 70–99)
Glucose-Capillary: 229 mg/dL — ABNORMAL HIGH (ref 70–99)

## 2011-02-28 LAB — AMMONIA: Ammonia: 71 umol/L — ABNORMAL HIGH (ref 11–60)

## 2011-03-01 LAB — COMPREHENSIVE METABOLIC PANEL
ALT: 9 U/L (ref 0–53)
AST: 18 U/L (ref 0–37)
Albumin: 3.4 g/dL — ABNORMAL LOW (ref 3.5–5.2)
Alkaline Phosphatase: 84 U/L (ref 39–117)
BUN: 12 mg/dL (ref 6–23)
Chloride: 85 mEq/L — ABNORMAL LOW (ref 96–112)
GFR calc Af Amer: >60 mL/min (ref >60)
Potassium: 3.5 mEq/L (ref 3.5–5.1)
Sodium: 138 mEq/L (ref 135–145)
Total Bilirubin: 0.8 mg/dL (ref 0.3–1.2)
Total Protein: 6.4 g/dL (ref 6.0–8.3)

## 2011-03-01 LAB — GLUCOSE, CAPILLARY
Glucose-Capillary: 159 mg/dL — ABNORMAL HIGH (ref 70–99)
Glucose-Capillary: 202 mg/dL — ABNORMAL HIGH (ref 70–99)

## 2011-03-01 LAB — CBC
MCV: 80.4 fL (ref 78.0–100.0)
Platelets: 43 10*3/uL — ABNORMAL LOW (ref 150–400)
RBC: 5.46 MIL/uL (ref 4.22–5.81)
RDW: 18.2 % — ABNORMAL HIGH (ref 11.5–15.5)
WBC: 4.7 10*3/uL (ref 4.0–10.5)

## 2011-03-02 LAB — COMPREHENSIVE METABOLIC PANEL
ALT: 10 U/L (ref 0–53)
AST: 20 U/L (ref 0–37)
Albumin: 3.7 g/dL (ref 3.5–5.2)
CO2: >45 mEq/L (ref 19–32)
Calcium: 8.8 mg/dL (ref 8.4–10.5)
Chloride: 82 mEq/L — ABNORMAL LOW (ref 96–112)
GFR calc Af Amer: >60 mL/min (ref >60)
GFR calc non Af Amer: 58 mL/min — ABNORMAL LOW (ref >60)
Sodium: 136 mEq/L (ref 135–145)

## 2011-03-02 LAB — GLUCOSE, CAPILLARY: Glucose-Capillary: 142 mg/dL — ABNORMAL HIGH (ref 70–99)

## 2011-03-02 LAB — CBC
Hemoglobin: 13.4 g/dL (ref 13.0–17.0)
MCHC: 29.3 g/dL — ABNORMAL LOW (ref 30.0–36.0)
Platelets: 52 10*3/uL — ABNORMAL LOW (ref 150–400)
RBC: 5.71 MIL/uL (ref 4.22–5.81)

## 2011-03-03 ENCOUNTER — Telehealth: Payer: Self-pay | Admitting: *Deleted

## 2011-03-03 ENCOUNTER — Ambulatory Visit: Payer: Medicaid Other | Admitting: Family Medicine

## 2011-03-03 LAB — GLUCOSE, CAPILLARY

## 2011-03-03 NOTE — Telephone Encounter (Signed)
Patient just discharged from hospital yesterday.  Was admitted for CHF.  While in the hospital developed N&V and was given Zofran.  Home health RN called today stating that patient is still vomiting  continuously and was not given any Zofran.  Told them that I could either work him in the afternoon or see if Dr. Janalyn Harder would be willing to call in more Zofran.  They decided to bring him in.  Put him in Dr. Marinell Blight schedule for 2:30 pm.

## 2011-03-07 ENCOUNTER — Telehealth: Payer: Self-pay | Admitting: Family Medicine

## 2011-03-07 ENCOUNTER — Telehealth: Payer: Self-pay | Admitting: *Deleted

## 2011-03-07 ENCOUNTER — Encounter: Payer: Self-pay | Admitting: Family Medicine

## 2011-03-07 NOTE — Telephone Encounter (Signed)
Needs verbal orders for walker and transfer bench

## 2011-03-07 NOTE — Telephone Encounter (Signed)
Verbal order for walker and transfer bench given to [place] .Arlyss Repress

## 2011-03-24 ENCOUNTER — Ambulatory Visit (INDEPENDENT_AMBULATORY_CARE_PROVIDER_SITE_OTHER): Payer: Medicaid Other | Admitting: Family Medicine

## 2011-03-24 ENCOUNTER — Encounter: Payer: Self-pay | Admitting: Family Medicine

## 2011-03-24 DIAGNOSIS — R609 Edema, unspecified: Secondary | ICD-10-CM

## 2011-03-24 DIAGNOSIS — I10 Essential (primary) hypertension: Secondary | ICD-10-CM

## 2011-03-24 DIAGNOSIS — D696 Thrombocytopenia, unspecified: Secondary | ICD-10-CM

## 2011-03-24 DIAGNOSIS — R601 Generalized edema: Secondary | ICD-10-CM

## 2011-03-24 DIAGNOSIS — J4489 Other specified chronic obstructive pulmonary disease: Secondary | ICD-10-CM

## 2011-03-24 DIAGNOSIS — J449 Chronic obstructive pulmonary disease, unspecified: Secondary | ICD-10-CM

## 2011-03-24 HISTORY — DX: Thrombocytopenia, unspecified: D69.6

## 2011-03-24 LAB — COMPREHENSIVE METABOLIC PANEL
ALT: <8 U/L (ref 0–53)
AST: 15 U/L (ref 0–37)
Albumin: 4.1 g/dL (ref 3.5–5.2)
CO2: 29 mEq/L (ref 19–32)
Calcium: 9 mg/dL (ref 8.4–10.5)
Chloride: 100 mEq/L (ref 96–112)
Potassium: 5 mEq/L (ref 3.5–5.3)
Sodium: 139 mEq/L (ref 135–145)
Total Protein: 6.7 g/dL (ref 6.0–8.3)

## 2011-03-24 NOTE — Assessment & Plan Note (Signed)
No worsening of dyspnea; appears stable on current oxygen supplementation.

## 2011-03-24 NOTE — Patient Instructions (Signed)
It was a pleasure to see you today.  For your hospital follow-up I am checking your blood count, metabolic panel today.   I will work with your primary doctor to help move toward Skilled Nursing Facility placement.  I ask that an appointment be made in the coming 1-2 weeks.

## 2011-03-24 NOTE — Progress Notes (Signed)
  Subjective:    Patient ID: Alex Foster, male    DOB: 04-18-1946, 65 y.o.   MRN: [id number]  HPI Patient here today for hospital follow up, accompanied by full-time caretaker Alex Foster (cell [phone]).  Patient hospitalized from May 27-31 for anasarca and concerns about mental status change, also found to be thrombocytopenic with platelet count in the 40K range at that time.  I have reviewed his DC summary as dictated by Alex Foster upon discharge.   Since returning home, Alex Foster continues to use his rolling walker but remains unsteady.  Has visiting nurse come daily.  Is still using oxygen via nasal canula at 2L/minute, does not have pain, cough , fevers or chills. His weight has been stable; weighs himself at home and has consistent weight of 248-250#.  This morning weighed himself at 148.8#.    Caretaker Alex Foster states that he will be unable to care for the patient for much longer, as he is moving out of state on August 1st.  The patient is amenable to entering a SNF, preferring one in [place], [place] where his only living relative (brother) resides.     Review of Systems see HPI     Objective:   Physical Exam Alert, in wheelchair, wearing nasal canula at 2L/min.  No apparent distress.  HEENT Neck supple.  COR RRR, no extra sounds.  PULM Distant breath sounds.  LEs: trace ankle edema.  Pack of [employer] in patient's vest pocket.        Assessment & Plan:

## 2011-03-24 NOTE — Assessment & Plan Note (Signed)
Patient with recent thrombocytopenia with plt count of 48K upon discharge.  Will recheck today.

## 2011-03-24 NOTE — Assessment & Plan Note (Addendum)
Weight has remained stable.  Of note, patient and caregiver are concerned about future care of the patient, once the caregiver moves out of state on August 1st.  He does not appear to have a medical necessity of hospitalization at this time.  I have told patient and caregiver that I will forward this issue to patient's PCP; given apparent declining functional status, may attempt FL-2 and outpatient placement to SNF.  Patient is listed as Medicaid, will need FL2 completed and may attempt to get admission to [facility] from home (Duwayne Heck, intake SW, [place] [phone])

## 2011-03-25 LAB — CBC
MCV: 82.4 fL (ref 78.0–100.0)
Platelets: 166 10*3/uL (ref 150–400)
RBC: 5.61 MIL/uL (ref 4.22–5.81)
RDW: 20.2 % — ABNORMAL HIGH (ref 11.5–15.5)
WBC: 4.9 10*3/uL (ref 4.0–10.5)

## 2011-03-27 ENCOUNTER — Telehealth: Payer: Self-pay | Admitting: *Deleted

## 2011-03-27 ENCOUNTER — Encounter: Payer: Self-pay | Admitting: Family Medicine

## 2011-03-27 NOTE — Telephone Encounter (Signed)
Patient's caregiver, Lurene Shadow, walked into the clinic today stating that Mr. Beaver condition has declined over the weekend and he needs to be admitted to the hospital for SNF placement (patient was not with him).  Patient was discharged on 5/31 with a dx of CVA and according to Mr. Coltraine his dysarthria has worsened and he has fallen multiple times. During his last hospitalization SNF placement was discussed but patient declined.  However,  when he came in for a hospital f/u with Dr. Mauricio Po on Friday he had changed his mind.  The caretakers told Dr. Mauricio Po that they can no longer care for him at their home and were wondering if he could be hospitalized that day with ultimate SNF placement.  Dr. Mauricio Po told them that there was no medical indication for admitting him.  He explained to them that Mr. Ogren was still in his 71 window since hospital d/c was 5/31.    Told the caretakers today that given his physical decline he probably should be evaluated to r/o extension of CVA and if medically warranted he may be hospitalized.  If not then paperwork for SNF placement could be initiated and patient would be placed on a waiting list.  Gave them an appointment for tomorrow afternoon.

## 2011-03-28 ENCOUNTER — Ambulatory Visit (INDEPENDENT_AMBULATORY_CARE_PROVIDER_SITE_OTHER): Payer: Medicaid Other | Admitting: Family Medicine

## 2011-03-28 ENCOUNTER — Encounter: Payer: Self-pay | Admitting: Family Medicine

## 2011-03-28 ENCOUNTER — Telehealth: Payer: Self-pay | Admitting: *Deleted

## 2011-03-28 DIAGNOSIS — H8110 Benign paroxysmal vertigo, unspecified ear: Secondary | ICD-10-CM

## 2011-03-28 DIAGNOSIS — R5381 Other malaise: Secondary | ICD-10-CM | POA: Insufficient documentation

## 2011-03-28 DIAGNOSIS — M6250 Muscle wasting and atrophy, not elsewhere classified, unspecified site: Secondary | ICD-10-CM

## 2011-03-28 NOTE — Telephone Encounter (Signed)
Called patient and asked if he could come in a little early, agreed to come in at 3:30.Alex Foster, Rodena Medin

## 2011-03-28 NOTE — Progress Notes (Signed)
  Subjective:    Patient ID: Alex Foster, male    DOB: 09/02/1946, 65 y.o.   MRN: [id number]  HPI  1. NH PLACEMENT: PLEASE SEE LAST OV NOTE, ALONG WITH PHONE NOTES, AND DC SUMMARY FROM LAST HOSPITALIZATION. Patient with multiple medical issues and Hx falls presenting with caregiver for NH Placement. The caregiver wants to place patient prior to his vacation this Thursday. Patient was recommended for NH Placement at his last hospitalization, but the patient declined. He is now amenable [place]. Endorses Hx of "near falls." Patient denies any pain. No trauma.  Review of Systems SEE HPI.    Objective:   Physical Exam General: Alert, with walker, wearing nasal canula at 2L/min.  No apparent distress. Pack of [employer] in patient's vest pocket.  HEENT Neck supple.  COR RRR, no extra sounds.  PULM Distant breath sounds.  LEs: trace ankle edema.  Skin: Few skin tears on left arm. No other bruising, edema, or deformity.    Assessment & Plan:

## 2011-03-28 NOTE — Assessment & Plan Note (Signed)
Spent > 25 minutes reviewing medical history, discussing case with Dr. Mauricio Po, Dennison Nancy, and Luretha Murphy. FL2 completed and faxed to [facility] along with DC Summary, last OV note, and demographics. No indication for hospital placement today.

## 2011-03-29 ENCOUNTER — Non-Acute Institutional Stay: Payer: Self-pay | Admitting: Family Medicine

## 2011-03-29 DIAGNOSIS — D696 Thrombocytopenia, unspecified: Secondary | ICD-10-CM

## 2011-03-29 DIAGNOSIS — J449 Chronic obstructive pulmonary disease, unspecified: Secondary | ICD-10-CM

## 2011-03-29 DIAGNOSIS — I10 Essential (primary) hypertension: Secondary | ICD-10-CM

## 2011-03-29 DIAGNOSIS — E119 Type 2 diabetes mellitus without complications: Secondary | ICD-10-CM

## 2011-03-29 DIAGNOSIS — M545 Low back pain, unspecified: Secondary | ICD-10-CM

## 2011-03-29 DIAGNOSIS — F17200 Nicotine dependence, unspecified, uncomplicated: Secondary | ICD-10-CM

## 2011-03-29 DIAGNOSIS — G4700 Insomnia, unspecified: Secondary | ICD-10-CM

## 2011-03-29 DIAGNOSIS — R601 Generalized edema: Secondary | ICD-10-CM

## 2011-03-29 DIAGNOSIS — J4489 Other specified chronic obstructive pulmonary disease: Secondary | ICD-10-CM

## 2011-03-29 DIAGNOSIS — R5381 Other malaise: Secondary | ICD-10-CM

## 2011-03-29 NOTE — Assessment & Plan Note (Signed)
New problem, recheck CBC early next week, if still a problem may need Heme/Onc referral.  Monitor for bleeding.

## 2011-03-29 NOTE — Assessment & Plan Note (Signed)
Refuse to use BIPAP, does use O2 at 2 liters [place] prn and during the night.  Nicotine patich

## 2011-03-29 NOTE — Assessment & Plan Note (Signed)
Meds as outlined

## 2011-03-29 NOTE — Assessment & Plan Note (Signed)
Resolved after IV torsemide and now po.  Monitor weights

## 2011-03-29 NOTE — Assessment & Plan Note (Signed)
Diazepam contraindicated as he has COPD, obstructive sleep apnea, and was admitted in respiratory acidosis.  Trial of trazodone 50 mg qhs.

## 2011-03-29 NOTE — Assessment & Plan Note (Signed)
Maintained on oral agents, check CBG AC and HS.  Will make adjustments as needed.

## 2011-03-29 NOTE — Assessment & Plan Note (Signed)
Admit to [facility] for skilled rehab

## 2011-03-29 NOTE — Progress Notes (Signed)
Subjective:    Patient ID: Alex Foster, male    DOB: 08/16/1946, 65 y.o.   MRN: [id number]  HPI:  Today patient was admitted to [facility] within the 30 day window after discharge from the hospital.  He is admitted for acute rehabilitation and eventual transfer to a facility in [place]  His brother, his only relative lives in [place].  Hospitalization from 5/27 to 5/31 for mental status changes associated with hepatic encephalopathy and respiratory acidosis.  He exhibited anasarca on admission and was diuresed initially with IV torsemide and eventually switched to oral form. He was treated with po lactulose for a ammonia level of 73.  He was placed on BIPAP in the hospital for respirator acidosis, he now refuses to use such.  Platelets were low at 40, suspected due to shunting but will need to be followed and possible referred to Heme/Onc.  He had been living with [place] [facility] for 20 years, [facility] brought patient to [place] [facility] yesterday stating that he was moving to [place] on July 1 and that [place] [facility] no longer would have a place to live.  In the face of his dependency in activities of daily living and his limited functional ability it was thought that [facility] in a SNF would be the next logical step to improve overall conditioning and physical health.  Patient has smoked for years, knows that he cannot here, he is willing to use a nicotine patch.  He has a long standing history of alcohol abuse, he reports not drinking in a year.  He did not have alcohol withdrawal in the 4 day hospital stay.  He used diazepam at home, it was discontinued in the hospital.  He should not resume use of benzodiazepines.   He is seen by a pain clinic and uses Percocet 10/325 for chronic low back pain.    Review of Systems  Constitutional: Negative for appetite change.  HENT: Positive for neck pain and neck stiffness. Negative for hearing loss and ear pain.   Eyes: Positive for visual  disturbance.  Cardiovascular: Negative for chest pain and leg swelling.  Musculoskeletal: Positive for back pain.  Poor vision right eye since birth Has gone to pain clinic for 3 years for back pain, never had surgery    Objective:   Physical Exam  Vitals reviewed. Constitutional: He is oriented to person, place, and time. He appears well-developed and well-nourished.       Alert, poorly groomed, 65 year old appearing much older than age. Generalized obesity  HENT:  Head: Normocephalic and atraumatic.  Right Ear: External ear normal.  Left Ear: External ear normal.  Nose: Nose normal.  Mouth/Throat: Oropharynx is clear and moist.       edentulous  Eyes: Conjunctivae and EOM are normal. Pupils are equal, round, and reactive to light.       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 Fundi not well visualized.   Neck: Normal range of motion. Neck supple.       Mildly decreased range of motion with complaint of posterior pain on movement  Cardiovascular: Normal rate, regular rhythm and normal heart sounds.   Pulmonary/Chest: Effort normal and breath sounds normal.       Prolonged expiration and distant breath sounds,  Abdominal: Soft. There is no tenderness. There is no rebound and no guarding.       Large umbilical hernia, easily reduced  Examined sitting  Musculoskeletal:  Large legs with trace edema  Neurological: He is alert and oriented to person, place, and time.  Psychiatric: Judgment and thought content normal.       Disgruntled sounding. Taking cell phone calls. Cooperative with questions and exam, but mildly irritable.          Assessment & Plan:

## 2011-03-29 NOTE — Assessment & Plan Note (Signed)
Unable to smoke at [facility], nicotine patch replacement.

## 2011-03-30 ENCOUNTER — Other Ambulatory Visit: Payer: Self-pay | Admitting: Family Medicine

## 2011-03-30 MED ORDER — OXYCODONE-ACETAMINOPHEN 10-325 MG PO TABS: 1.0000 | ORAL_TABLET | ORAL | Status: DC | PRN

## 2011-03-30 NOTE — Telephone Encounter (Signed)
Form signed and faxed back to [facility]. I don't remember the number of pills.

## 2011-03-31 ENCOUNTER — Encounter: Payer: Self-pay | Admitting: Family Medicine

## 2011-04-04 ENCOUNTER — Ambulatory Visit: Payer: Medicaid Other | Attending: Physical Therapy | Admitting: Physical Therapy

## 2011-04-06 NOTE — H&P (Signed)
[place], [place]              ACCOUNT NO.:  [id number]  MEDICAL RECORD NO.:  [id number]           PATIENT TYPE:  E  LOCATION:  [facility]                         FACILITY:  [facility]  PHYSICIAN:  Oakley Orban A. Sheffield Slider, M.D.    DATE OF BIRTH:  May 12, 1946  DATE OF ADMISSION:  02/26/2011 DATE OF DISCHARGE:                             HISTORY & PHYSICAL   PRIMARY CARE PROVIDER:  [name], MD at the [facility]  CHIEF COMPLAINT:  Anasarca, altered mental status.  HISTORY OF PRESENT ILLNESS:  This is a 65 year old male with significant past medical history of hypertension, hyperlipidemia, alcohol abuse, COPD with night time oxygen presenting with anasarca and altered mental status (level V caveat on HPI secondary to confusion and dysarthria on the patient).  The patient presented to the ED via EMS with concern for stroke and anasarca.  The patient states that he has noticed worsening edema over the last 2 weeks with worsening orthopnea and dyspnea on exertion.  No chest pain, positive nausea.  Per EMS report, the patient has been taken 2-3 servings of sodium bicarbonate daily over the last 2 weeks for reflux.  This history was obtained by family per EMS report. The patient feels that he may have had a stroke.  It is noted in this history that the patient has had a right external capsule stroke in 2005.  The patient states that stroke is not similar.  He denies any worsening of baseline stroke symptoms prior to presentation.  No focal neurological deficits.  The patient denies any recent infections and the patient is unsure why he thinks that may have had a stroke.  ED COURSE:  The patient was given Zofran en route by EMS for since his subjective nausea which improved. No reported nitroglycerin use.  Upon arrival to the ED, labs were drawn, was significant for a BNP of 2670 as well as a negative head CT.  ALLERGIES:  PENICILLIN.  PAST MEDICAL HISTORY: 1. History of  internal capsule hemorrhagic stroke in 2005. 2. Hyperlipidemia. 3. Hypertension. 4. Diabetes. 5. COPD. 6. Low back pain. 7. History of alcohol abuse.  MEDICATIONS: 1. Albuterol HFA 2-4 puffs q.4 h p.r.n. shortness birth. 2. Aspirin 81 mg p.o. daily. 3. Coreg 3.125 mg p.o. b.i.d. 4. Valium 10 mg p.o. nightly p.r.n. sleep. 5. Advair 500/50 one puff inhaled b.i.d. 6. Glipizide 5 mg p.o. b.i.d. with meals. 7. Lisinopril/HCTZ 20/12.5 one tablet p.o. daily. 8. Claritin 10 mg p.o. daily. 9. Metformin 1000 mg p.o. b.i.d. 10.Zocor 40 mg p.o. nightly. 11.Spiriva 18 grams inhaled daily.  PAST SURGICAL HISTORY:  The patient with anterolisthesis in September 2007.  SOCIAL HISTORY:  The patient currently is unemployed, lives with his niece as well as her boyfriend at home.  The patient does report up to a pack per day smoking.  The patient reports a previous daily drinking history up until approximately 1 month ago.  The patient denies any current alcohol intake.  No other drug use per the patient.  REVIEW OF SYSTEMS:  Negative except as noted above in HPI.  PHYSICAL EXAMINATION:  VITAL SIGNS:  Temperature 98.0, heart rate 85-92, blood pressure 134-144/67-69, respirations 20-24, satting 92% on 4 liters. GENERAL:  The patient is morbidly obese, slowed mentation and mild increased work of breathing with overall disheveled appearance. HEENT:  Extraocular movements intact.  No sclerae icterus.  Positive scleral edema, large neck girth and poor dentition. CARDIOVASCULAR:  Distant heart sounds.  No overt murmurs auscultated. LUNGS:  Distant breath sounds diffusely.  No wheezes, rales or rhonchi auscultated. ABDOMEN:  Morbidly obese abdomen, nontender.  Positive central umbilical hernia which is reducible and nonulcerated.  No fluid wave or HJR palpated. EXTREMITIES.  3-4+ pitting edema in lower extremities extending into distal thighs. SKIN:  No rashes, ecchymoses, or petechiae  noted. NEUROLOGIC:  The patient oriented x2, dysarthric speech, mild right- sided facial droop which is reported as baseline, left handed.  No focal neurological deficits.  LABS AND STUDIES: 1. BMET:  Sodium 133, potassium 4.8, chloride 95, CO2 34, BUN 18,     creatinine 1.03, glucose 116. 2. CBC:  White count 5.7, hemoglobin 12.9, hematocrit 44.2, platelet     count of 48, confirmed by manual counting. 3. BNP of 2670. 4. Troponin I of less than 0.3. 5. INR 1.08. 6. UA which is within normal limits. 7. Chest x-ray showing cardiomegaly with pulmonary vascular congestion     and COPD. 8. Head CT showing no acute intracranial abnormalities, chronic small-     vessel ischemic disease.  ASSESSMENT AND PLAN:  This is a 65 year old male with multiple comorbidities including hypertension, hyperlipidemia, chronic obstructive pulmonary disease, morbid obesity presenting with anasarca and altered mental status. 1. Altered mental status.  This is likely multifactorial with     contribution of multiple problems including chronic diazepam use,     volume overload, COPD as well as poorly treated sleep apnea     secondary to noncompliance.  This patient has been reported to take     CPAP intermittently at home with other contributions of morbid     obesity and questionable alcohol intake in the setting of baseline     history of alcohol abuse.  Head CT is negative for any acute     intracranial abnormalities which is reassuring.  We will check an     ABG.  We will also check an UDS and alcohol level.  Continue the     patient's home diazepam use.  We will place on CIWA protocol.  At     this point, it is unclear if this is the patient's baseline or not.     We will reassess in a.m.  We will also cycle cardiac enzymes.  We     will place the patient on full-dose aspirin. 2. Anasarca.  This is likely secondary to the patient's extremely high     sodium intake over the last 2 weeks given high-dose  sodium     bicarbonate intake.  We will diurese the patient with IV torsemide.     We will place the patient on sodium and fluid restrictions.  We     will check a BNP.  We will hold HCTZ and Mobic in setting of     diuresis as avoiding secondary kidney injury.  We will check a 2-D     echo as to assess LV function.  We will also check a TSH.  We will     cycle cardiac enzymes.  We will hold ACE inhibitor if the patient's     creatinine begins to increase in  the setting of diuresis. 3. Hypoxia.  The patient is reported to be on home O2 intermittently     in the past.  There is likely a contribution of volume overload in     this picture as well with likely COPD and obstructive sleep apnea     as well.  We will check an ABG, supplemental O2 as needed.  The     patient will be placed on nightly CPAP as the patient can tolerate. 4. Hyponatremia.  This is likely secondary to CHF exacerbation with     concurrent Dyazide use.  We will reassess in a.m. after diuresis. 5. Metabolic alkalosis.  The patient is noted to have a bicarb at 34     on presentation, which is likely secondary to excessive sodium     bicarbonate intake and likely underlying respiratory disease and     now to imagine there is some baseline conversation this baseline     pulmonary disease.  As stated before, we will check an ABG.  We     will hold on any additional sodium bicarbonate intake.  The patient     has not been on diuretics prior to admission. 6. Thrombocytopenia.  This is an unclear etiology, most recent records     show the patient's platelet count in the 150s prior to this.  There     is a question of whether this is a delusional effect from volume     overload.  We will check a peripheral smear as to assess as there     is a consumptive process.  No active signs of bleeding on exam.  No     noted purpura.  We will hold on heparin though in the setting of     thrombocytopenia. 7. Hypertension.  BP is overall  stable.  Continue home Coreg and     lisinopril. 8. Diabetes.  Sensitive sliding scale, A1c.  We will hold on metformin     and glipizide. 9. History of alcohol abuse.  We will check right upper quadrant     ultrasound for evaluation of possible cirrhotic disease.  Serum     alcohol level as well as LFTs are within normal limits which is     reassuring.  We will place the patient on CIWA protocol.  Of note,     the patient does have an event of heart failure or cirrhotic     disease that the patient may benefit from aldactone as outpatient.     However, we will assess this while the patient is in-house. 10.Hyperlipidemia.  Continue home statin.  We will check fasting lipid     panel. 11.Chronic obstructive pulmonary disease.  Continue home medications. 12.Psychiatry.  Continue home Wellbutrin. 13.Fluids, electrolytes, nutrition/GI:  Normal saline at [facility] 1 gram     sodium diet, 1.5-L fluid restriction. 14.Prophylaxis.  SCDs. 15.Disposition.  Pending further evaluation.     Doree Albee, MD   ______________________________ Arnette Norris Sheffield Slider, M.D.    SN/MEDQ  D:  02/27/2011  T:  02/27/2011  Job:  [id number]  Electronically Signed by Doree Albee  on 04/04/2011 10:12:10 AM Electronically Signed by Zachery Dauer M.D. on 04/06/2011 11:41:57 AM

## 2011-04-06 NOTE — Discharge Summary (Signed)
[place], [place] NO.:  [id number]  MEDICAL RECORD NO.:  [id number]  LOCATION:  4708                         FACILITY:  [facility]  PHYSICIAN:  Alex Foster, M.D.    DATE OF BIRTH:  10/15/1945  DATE OF ADMISSION:  02/26/2011 DATE OF DISCHARGE:  03/02/2011                              DISCHARGE SUMMARY   PRIMARY CARE PROVIDER:  [name], MD, from [facility].  DISCHARGE DIAGNOSES: 1. Anasarca. 2. Respiratory acidosis. 3. Altered mental status. 4. History of alcohol abuse. 5. Thrombocytopenia. 6. History of internal capsule hemorrhagic stroke in 2005. 7. Hyperlipidemia. 8. Hypertension. 9. Diabetes. 10.Chronic obstructive pulmonary disease. 11.Low back pain.  DISCHARGE MEDICATIONS:  New medications: 1. Folic acid 1 mg p.o. daily. 2. Lactulose 10 g per 15 mL solution, 30 mL p.o. t.i.d. 3. Nicotine patch 21 mg per 24-hour patch transdermally q.24 hours. 4. Torsemide 20 mg 1 tablet p.o. daily. 5. Thiamine 100 mg p.o. daily.  He is to continue the following medications: 1. Advair Diskus 500/50 one puff inhaled b.i.d. 2. Albuterol inhaler 90 mcg 2-4 puffs q.4 h. p.r.n. for shortness of     breath or wheezing. 3. Aspirin 81 mg 1 tablet p.o. daily 4. Coreg 3.125 mg 1 tablet p.o. b.i.d. 5. Glipizide 5 mg 1 tablet p.o. b.i.d. a.c. 6. Lisinopril/hydrochlorothiazide 20/12.5 mg 1 tablet p.o. daily. 7. Loratadine 10 mg 1 tablet p.o. daily. 8. Metformin 1000 mg 1 tablet p.o. b.i.d. 9. Percocet 10/325 mg 1 tablet p.o. q.4 h. p.r.n. for pain. 10.Spiriva 18 mcg one cap inhaled daily. 11.Zocor 40 mg 1 tablet p.o. at bedtime.  He is to discontinue the following medications, diazepam 10 mg 1 tablet p.o. at bedtime p.r.n.  PROCEDURES AND IMAGING:  On Feb 26, 2011, the patient underwent a chest x-ray with impression cardiomegaly without pulmonary vascular congestion.  COPD.  He had a CT of the head without contrast on Feb 26, 2011, and  impression no acute intracranial abnormalities.  Chronic small vessel ischemic disease.  He had a complete abdominal ultrasound on Feb 27, 2011, with impression mildly course of hepatic echotexture without focal abnormality.  Borderline splenomegaly.  These findings may indicate cirrhosis/shunting.  No other acute abnormality.  The patient had an echocardiogram on Feb 27, 2011, with impression: 1. Left ventricle cavity size was mildly dilated.  Wall thickness was     increased in the pattern of mild LVH.  Systolic function was     normal.  The estimated ejection fraction was in the range of 50% to     55%.  Doppler parameters are consistent with abnormal left     ventricular relaxation (grade 1 diastolic dysfunction). 2. Left atrium:  Left atrium was mildly dilated.  The patient had EKG     on Feb 27, 2011, with impression sinus rhythm with first-degree AV     block, possible left atrial enlargement, right bundle-branch block,     left anterior bifascicular block, abnormal ECG since last tracing     no significant change, however.  LABORATORY DATA:  At time of admission, CBC with WBCs of 5.7, hemoglobin of 12.9, hematocrit 44.2, and platelets of 48.  Complete metabolic panel was sodium 133, potassium 4.8, chloride 95, bicarb 34, BUN of 18, creatinine 1.03, glucose 116.  Total bilirubin 0.5, alk phos of 89, AST 23, ALT of 9, total protein of 6.3, albumin of 3.4.  At time of admission, beta natriuretic peptide was 2670.  Cardiac enzymes were negative x3.  Blood gas at time of admission showed pH of 7.169, pCO2 of 107.9, pO2 of 68, bicarb of 39.3.  Alcohol level was less than 11. Salicylate level was within normal limits.  During the hospitalization, the patient's ABG improved to 7.349 with a pCO2 of 84.7, pO2 of 65.2, and bicarb of 45.5 on BiPAP.  A blood ammonia level was found to be elevated at 72.  Other labs during hospitalization, hemoglobin A1c was 7.1.  TSH was within normal limits.   At time of discharge, CBC with WBCs of 4.8, hemoglobin of 13.4, hematocrit of 45.7, and platelets of 52. Comprehensive metabolic panel with sodium 136, potassium of 3.8, and chloride of 82, CO2 greater than 45, BUN of 19, creatinine 1.25, glucose 173.  Total bili 0.80, alk phos 91, AST 20, ALT 10, total protein 6.8, albumin 3.7.  BRIEF HOSPITAL COURSE:  This is a 65 year old male who presented with anasarca, altered mental status, and respiratory acidosis. 1. Anasarca.  At the time of admission, the patient with diffuse     pitting edema.  BMP was obtained and found to be elevated at 2670.     Chest x-ray did show cardiomegaly with pulmonary vascular     congestion consistent with volume overload.  During     hospitalization, the patient was diuresed with torsemide IV.  An     echocardiogram was obtained which showed good systolic function,     however, did have grade 1 diastolic dysfunction.  With the use of     torsemide, the patient had good fluid output and his anasarca     improved.  It was felt that his anasarca was likely secondary to     excessive consumption of sodium-containing products mainly sodium     bicarbonate as an antacid.  The patient was counseled on the use of     this and that may contribute to the swelling in the future.  At the     time of discharge, the patient will be continued on torsemide 20 mg     1 tablet p.o. daily. 2. Respiratory acidosis.  The patient with severe underlying COPD as     well as obstructive sleep apnea.  At time of admission, blood gas     showed a severe chronic respiratory acidosis.  The patient was     placed on BiPAP initially and his blood gas improved significantly     over the next day.  After blood gas improved, he was still     continued on BiPAP while sleeping.  Recommend that he be continued     on BiPAP at home while sleeping to help prevent further progression     of his respiratory acidosis. 3. Altered mental status.  This  was thought to be likely     multifactorial with a combination of severe respiratory acidosis as     well as hepatic encephalopathy.  The patient's mental status did     improve with the addition of BiPAP and improvement of his blood     gas.  The patient was alert and oriented after improvement of his     blood gas, however,  still had poor insight and judgment.  An     ammonia level was obtained and found to be elevated to 71.     Lactulose was started at that time, but the patient initially     refused after two doses.  At that time, rifaximin was added to his     regimen.  However, the patient began to take the lactulose again.     Recommend followup of an ammonia level at his followup appointment.     May consider Folstein mini-mental state exam as well at followup. 4. Thrombocytopenia at time of admission, platelets significantly well     into the 40s.  It was felt this was likely secondary to splenic     sequestration secondary to shunting from his liver disease.     Platelet did remain stable throughout the hospitalization.  A     consult to Hematology/Oncology was considered, but since we felt     like we knew the source of his thrombocytopenia we elected not to     get that at this time.  If platelets continue to drop further, may     consider Hematology/Oncology workup as outpatient.  The patient has     not experienced any active bleeding during the hospitalization.     However, recommend sending the patient to GI for EGD to look for     esophageal varices given his liver disease and high chance of     bleeding with low platelets.  May also consider increase of Coreg     to 6.25 mg as indicated for variceal bleed and portal hypertension     prophylaxis.  We considered starting this prior to discharge;     however, his pressures were slightly on the low side. 5. History of alcohol abuse.  Right upper quadrant ultrasound with     cirrhotic changes.  The patient was placed on CIWA  protocol during     the hospitalization.  The patient denied any alcohol use over the     past year.  The patient did not exhibit any signs of withdrawal     during the hospitalization. 6. Hypertension.  The patient was continued on his Coreg as well as     lisinopril/hydrochlorothiazide throughout the hospitalization.  His     blood pressure stayed in good control throughout the     hospitalization. 7. Type 2 diabetes mellitus.  The patient was placed on sliding scale     insulin throughout the hospitalization.  His CBG showed good     control throughout the hospitalization.  He will be discharged on     his home dose of metformin and glipizide.  DISCHARGE INSTRUCTIONS:  The patient instructed to increase activity slowly.  He is to follow a low-sodium, heart-healthy diet and avoid sodium-containing antacids.  He is encouraged strongly to stop smoking. He was reminded that he should be sure to use his oxygen at home as well as his BiPAP at night if this is functioning.  If this is nonfunctioning, he should follow up with Dr. Angeline Foster for referral for sleep study and new machine.  He was reminded to continue the instructions in his heart failure book and record daily weights.  FOLLOWUP:  He is to follow up with Dr. Angeline [facility] at the [facility].  He is to call and make a followup appointment within the next week.  DISCHARGE CONDITION:  The patient was discharged home in stable  medical condition.    ______________________________ Everrett Coombe, MD   ______________________________ Arnette Norris. [place] Foster, M.D.    CM/MEDQ  D:  03/06/2011  T:  03/07/2011  Job:  [id number]  cc:   Alex Slim, MD  Electronically Signed by Everrett Coombe MD on 03/14/2011 01:48:21 PM Electronically Signed by Zachery Dauer M.D. on 04/06/2011 11:43:03 AM

## 2011-04-07 NOTE — Progress Notes (Signed)
I interviewed and examined this patient and discussed the treatment plan with Luretha Murphy on 03/30/2011.

## 2011-04-12 ENCOUNTER — Non-Acute Institutional Stay: Payer: Self-pay | Admitting: Family Medicine

## 2011-04-12 DIAGNOSIS — K7290 Hepatic failure, unspecified without coma: Secondary | ICD-10-CM

## 2011-04-12 DIAGNOSIS — R5381 Other malaise: Secondary | ICD-10-CM

## 2011-04-12 DIAGNOSIS — D696 Thrombocytopenia, unspecified: Secondary | ICD-10-CM

## 2011-04-12 DIAGNOSIS — K7682 Hepatic encephalopathy: Secondary | ICD-10-CM

## 2011-04-12 DIAGNOSIS — R601 Generalized edema: Secondary | ICD-10-CM

## 2011-04-12 HISTORY — DX: Hepatic failure, unspecified without coma: K72.90

## 2011-04-12 HISTORY — DX: Hepatic encephalopathy: K76.82

## 2011-04-12 NOTE — Assessment & Plan Note (Signed)
Resolved, currently on torsemide bid, check labs next week.

## 2011-04-12 NOTE — Assessment & Plan Note (Signed)
Amazing turn around since he stopped smoking and is getting his inhaled meds on a regular basis.  Currently on Nicotine patch 21; will decrease after one month.

## 2011-04-12 NOTE — Assessment & Plan Note (Signed)
New problem during hospital stay, thought to be acute phase reaction, recheck next week

## 2011-04-12 NOTE — Assessment & Plan Note (Signed)
Significant improvement in mobility with after 2 weeks of PT, using a cane.  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  Has a scooter at that was moved to his brothers house to use when out and about.

## 2011-04-12 NOTE — Assessment & Plan Note (Signed)
Long standing problem on high dose narcotics, he admits that he would like to get off the strong pain meds.  Will decrease to Percocet 5/325( from 10/325) tid prn, add a topical rub per his request, and consider SNRI.  It would be nice to get him to at least hydrocodone prior to discharge.  He will likely always need something as he reports neurogenic claudication.

## 2011-04-12 NOTE — Progress Notes (Signed)
  Subjective:    Patient ID: Alex Foster, male    DOB: Feb 03, 1946, 65 y.o.   MRN: [id number]  HPI Patient is participating in rehabilitation and now walking with the assistance of a simple cane.  He reports that he cannot walk long distances secondary to severe back pain, and has used a scooter for years.  He has been going to the pain clinic and receiving narcotics for such.He requested an hot rub for his pains as he would like to stop using the pain medications He was an outpatient at [facility] until he was admitted to [facility] after being discharged from the hospital and the individuals that he lived with announced that they were moving and he no longer had a place to stay.  He was very deconditioned after an admission for anasarca, thrombocytopenia, hepatic encephalopathy. He reports feeling much better, that he will never smoke cigarettes again, and wanting to move to [place] after discharge to live with his brother.    He is having 3-4 loose stools a day with irritation of his skin in his perianal area.     Review of Systems  Respiratory: Negative for chest tightness and shortness of breath.   Cardiovascular: Negative for leg swelling.  Gastrointestinal: Positive for diarrhea.  Musculoskeletal: Positive for back pain.  Psychiatric/Behavioral: Positive for dysphoric mood.       Objective:   Physical Exam  Constitutional:       Overall appearance is significantly improved, looks like a different man from when he was admitted.  Cardiovascular: Normal rate and regular rhythm.   Pulmonary/Chest: Effort normal and breath sounds normal.       No longer using O2 continueously.  Skin:       Reports raw skin in anal area  Psychiatric:       Appears less depressed, oriented, intact short term memory.          Assessment & Plan:

## 2011-04-12 NOTE — Assessment & Plan Note (Signed)
Last ammonia level was 84, currently on lactulose 30 mg tid, is very bothered by frequent stool ing.  Reduce dosage frequency to bid and recheck next week.

## 2011-04-14 ENCOUNTER — Ambulatory Visit: Payer: Medicaid Other | Admitting: Family Medicine

## 2011-04-19 ENCOUNTER — Other Ambulatory Visit: Payer: Self-pay | Admitting: Family Medicine

## 2011-04-24 ENCOUNTER — Encounter: Payer: Self-pay | Admitting: Family Medicine

## 2011-04-26 ENCOUNTER — Non-Acute Institutional Stay: Payer: Self-pay | Admitting: Family Medicine

## 2011-04-27 ENCOUNTER — Encounter: Payer: Self-pay | Admitting: Family Medicine

## 2011-04-27 ENCOUNTER — Non-Acute Institutional Stay: Payer: Self-pay | Admitting: Family Medicine

## 2011-04-27 ENCOUNTER — Other Ambulatory Visit: Payer: Self-pay | Admitting: Family Medicine

## 2011-04-27 DIAGNOSIS — D696 Thrombocytopenia, unspecified: Secondary | ICD-10-CM

## 2011-04-27 DIAGNOSIS — J4489 Other specified chronic obstructive pulmonary disease: Secondary | ICD-10-CM

## 2011-04-27 DIAGNOSIS — K7682 Hepatic encephalopathy: Secondary | ICD-10-CM

## 2011-04-27 DIAGNOSIS — I10 Essential (primary) hypertension: Secondary | ICD-10-CM

## 2011-04-27 DIAGNOSIS — E119 Type 2 diabetes mellitus without complications: Secondary | ICD-10-CM

## 2011-04-27 DIAGNOSIS — M948X9 Other specified disorders of cartilage, unspecified sites: Secondary | ICD-10-CM

## 2011-04-27 DIAGNOSIS — F17200 Nicotine dependence, unspecified, uncomplicated: Secondary | ICD-10-CM

## 2011-04-27 DIAGNOSIS — J449 Chronic obstructive pulmonary disease, unspecified: Secondary | ICD-10-CM

## 2011-04-27 DIAGNOSIS — K7290 Hepatic failure, unspecified without coma: Secondary | ICD-10-CM

## 2011-04-27 MED ORDER — LACTULOSE 10 GM/15ML PO SOLN: 20.0000 g | Freq: Two times a day (BID) | ORAL | Status: DC

## 2011-04-27 MED ORDER — NICOTINE 14 MG/24HR TD PT24: 1.0000 | MEDICATED_PATCH | TRANSDERMAL | Status: AC

## 2011-04-27 MED ORDER — ASPIRIN 81 MG PO TBEC: 81.0000 mg | DELAYED_RELEASE_TABLET | Freq: Every day | ORAL | Status: DC

## 2011-04-27 MED ORDER — LISINOPRIL-HYDROCHLOROTHIAZIDE 20-12.5 MG PO TABS: 1.0000 | ORAL_TABLET | Freq: Every day | ORAL | Status: AC

## 2011-04-27 MED ORDER — OXYCODONE-ACETAMINOPHEN 10-325 MG PO TABS: 1.0000 | ORAL_TABLET | ORAL | Status: DC | PRN

## 2011-04-27 MED ORDER — METFORMIN HCL 1000 MG PO TABS: 1000.0000 mg | ORAL_TABLET | Freq: Two times a day (BID) | ORAL | Status: DC

## 2011-04-27 MED ORDER — NICOTINE 7 MG/24HR TD PT24: 1.0000 | MEDICATED_PATCH | TRANSDERMAL | Status: AC

## 2011-04-27 MED ORDER — FLUTICASONE-SALMETEROL 500-50 MCG/DOSE IN AEPB: 1.0000 | INHALATION_SPRAY | Freq: Two times a day (BID) | RESPIRATORY_TRACT | Status: DC

## 2011-04-27 MED ORDER — TIOTROPIUM BROMIDE MONOHYDRATE 18 MCG IN CAPS: 18.0000 ug | ORAL_CAPSULE | Freq: Every day | RESPIRATORY_TRACT | Status: DC

## 2011-04-27 MED ORDER — SIMVASTATIN 40 MG PO TABS: 40.0000 mg | ORAL_TABLET | Freq: Every day | ORAL | Status: DC

## 2011-04-27 MED ORDER — TORSEMIDE 20 MG PO TABS: 20.0000 mg | ORAL_TABLET | Freq: Every day | ORAL | Status: DC

## 2011-04-27 MED ORDER — ALBUTEROL SULFATE HFA 108 (90 BASE) MCG/ACT IN AERS: 2.0000 | INHALATION_SPRAY | RESPIRATORY_TRACT | Status: DC | PRN

## 2011-04-27 MED ORDER — CARVEDILOL 3.125 MG PO TABS: 3.1250 mg | ORAL_TABLET | Freq: Two times a day (BID) | ORAL | Status: DC

## 2011-04-27 NOTE — Progress Notes (Signed)
Alex Foster has been a long term patient of our practice.  He will be moving to [place] to live with his brother.  He did very well during the 30 days stay at the skilled nursing facility, received rehab services and medications were titrated.  He quit smoking during that time, his blood sugars were well controlled and he came off the glipizide.  We did try to decrease the dosage of his narcotic but it was not effective in controlling his pain.  He has been on narcotics for a long time for lumbar spinal stenosis and neurogenic claudication.  Overall he is very stable, cognitively intact, medically stable and ready for discharge.

## 2011-04-27 NOTE — Assessment & Plan Note (Signed)
Improved significantly, continue current inhaled meds.  Helped signifcantly that he has stopped smoking.  Continue nicotine replacement, can decrease to 14mg  patch at time of discharge.

## 2011-04-27 NOTE — Assessment & Plan Note (Signed)
Continue current meds.  Was supposed to have an A1C but could not find this in chart during visit.  CBG's look well controlled at this time.

## 2011-04-27 NOTE — Assessment & Plan Note (Signed)
COntinue patch replacement.  Doing well with stopping smoking while at [facility].

## 2011-04-27 NOTE — Assessment & Plan Note (Signed)
COntinue current meds, bp looks ok on current meds.

## 2011-04-27 NOTE — Assessment & Plan Note (Signed)
Currently on narcotic pain medication.  Will add on Effexor to help with better pain control in hopes of getting him on reduced doses of narcotics.  Will leave current narcotic regimen the same, consider titration up on effexor in 1-2 weeks.

## 2011-04-27 NOTE — Progress Notes (Signed)
  Subjective:    Patient ID: Alex Foster, male    DOB: 1946/03/15, 65 y.o.   MRN: [id number]  HPI Patient visited at [facility] nursing home.  Overall patient doing well.  Complaining of mild shoulder pain, disappointed that his pain medicines were reduced.  Still participating in rehabilitation activities while using cane for ambulatory assistance.  Still plans on moving to [place] with his brother on 7/29.   He is unsure how long he will stay in [place] and may return here in a few months.  He has stopped smoking since being at [facility], which he says has made him feel better.  No additional problems with anasarca that he was having while hospitalized.  Thrombocytopenia seems to be stable, no bleeding that he has noticed.    Review of Systems Denies nausea, headache,chest pain, focal weakness, palpitations, edema, loose stools improved.    Objective:   Physical Exam  Constitutional: He is oriented to person, place, and time. He appears well-developed and well-nourished. No distress.  HENT:  Head: Normocephalic and atraumatic.  Eyes: Pupils are equal, round, and reactive to light. No scleral icterus.  Neck: Normal range of motion. Neck supple. No thyromegaly present.  Cardiovascular: Normal rate, regular rhythm and normal heart sounds.   Pulmonary/Chest: Effort normal and breath sounds normal. No respiratory distress. He has no wheezes.  Abdominal: Soft. Bowel sounds are normal. He exhibits distension. There is no tenderness. There is no rebound and no guarding.       Mild distention   Musculoskeletal: He exhibits no edema.       Decreased ROM in shoulders with mild TTP along upper paraspinal musculature  Neurological: He is alert and oriented to person, place, and time. No cranial nerve deficit.  Skin: Skin is warm and dry.  Psychiatric: He has a normal mood and affect. His behavior is normal.          Assessment & Plan:   No problem-specific assessment & plan notes found for  this encounter.

## 2011-04-27 NOTE — Assessment & Plan Note (Signed)
Continue lactulose, no new NH3 level on chart.  Mentating well today.

## 2011-04-29 ENCOUNTER — Encounter: Payer: Self-pay | Admitting: Family Medicine

## 2011-05-01 ENCOUNTER — Telehealth: Payer: Self-pay | Admitting: *Deleted

## 2011-05-01 NOTE — Telephone Encounter (Signed)
recieved call from [facility] in [place]  advising that they did pick patient up as a Home Health patient because his brother went to get him in [place] to take him to [name] but patient refused to go saying he  has someone to stay with in [place], so he is still in [place] area.

## 2011-05-01 NOTE — Telephone Encounter (Signed)
Alex Foster, was this your nursing home pt?

## 2011-05-03 ENCOUNTER — Telehealth: Payer: Self-pay | Admitting: *Deleted

## 2011-05-03 NOTE — Telephone Encounter (Signed)
Received call from pharmacy [phone] regarding Lactulose Rx. Alex Foster sent in rx for 8 ounces and this will only last him 4 days. Patient and pharmacy need clarification on this and to make sure he is suppose to take twice daily.

## 2011-05-04 NOTE — Telephone Encounter (Signed)
[facility] was called and they will give the amount sufficient to cover 60 ml daily.

## 2011-05-17 ENCOUNTER — Ambulatory Visit (INDEPENDENT_AMBULATORY_CARE_PROVIDER_SITE_OTHER): Payer: Medicaid Other | Admitting: Family Medicine

## 2011-05-17 ENCOUNTER — Encounter: Payer: Self-pay | Admitting: Family Medicine

## 2011-05-17 VITALS — BP 106/65 | HR 80 | Ht 71.0 in | Wt 240.0 lb

## 2011-05-17 DIAGNOSIS — M545 Low back pain: Secondary | ICD-10-CM

## 2011-05-17 DIAGNOSIS — I10 Essential (primary) hypertension: Secondary | ICD-10-CM

## 2011-05-17 DIAGNOSIS — E119 Type 2 diabetes mellitus without complications: Secondary | ICD-10-CM

## 2011-05-17 DIAGNOSIS — F17200 Nicotine dependence, unspecified, uncomplicated: Secondary | ICD-10-CM

## 2011-05-17 MED ORDER — VENLAFAXINE HCL ER 75 MG PO CP24: 75.0000 mg | ORAL_CAPSULE | Freq: Every day | ORAL | Status: DC

## 2011-05-17 MED ORDER — LIDOCAINE 5 % EX PTCH: 1.0000 | MEDICATED_PATCH | CUTANEOUS | Status: AC

## 2011-05-17 MED ORDER — OXYCODONE-ACETAMINOPHEN 5-325 MG PO TABS: 1.0000 | ORAL_TABLET | Freq: Three times a day (TID) | ORAL | Status: DC | PRN

## 2011-05-17 MED ORDER — HYDROCODONE-ACETAMINOPHEN 7.5-500 MG PO TABS: 1.0000 | ORAL_TABLET | Freq: Three times a day (TID) | ORAL | Status: DC | PRN

## 2011-05-17 MED ORDER — HYDROCODONE-ACETAMINOPHEN 7.5-500 MG PO TABS: 1.0000 | ORAL_TABLET | Freq: Three times a day (TID) | ORAL | Status: AC | PRN

## 2011-05-17 MED ORDER — OXYCODONE-ACETAMINOPHEN 5-325 MG PO TABS: 1.0000 | ORAL_TABLET | Freq: Three times a day (TID) | ORAL | Status: AC | PRN

## 2011-05-17 NOTE — Patient Instructions (Signed)
I am giving you your percocet prescriptions for the next 3 months.  You will need to have another appointment with me before you can get these refilled again.  Please plan to come see me in TWO months to see if the increased dose of effexor and/or the lidoderm patches are helping.  If you have thoughts of wanting to hurt yourself or anyone else, or have any strange thinking, please call the clinic or go to the nearest emergency room.  PLEASE try to stop smoking! You were doing so well and this will be the easiest time to quit! The longer you continue to smoke for, the harder it will be!

## 2011-05-18 ENCOUNTER — Encounter: Payer: Self-pay | Admitting: Family Medicine

## 2011-05-18 NOTE — Assessment & Plan Note (Signed)
Last A1c 7.1 (02/26/2011).  No s/s of hypo or hyperglycemia.  Continue current txt with metformin.

## 2011-05-18 NOTE — Progress Notes (Signed)
S: Pt comes in today for follow up.  CHRONIC PAIN Asking for something to bridge him to next percocet Rx since he will run out before he can fill it since it was changed to q8 but he was still taking q6.  Having lots of sharp neck pain, worse with turning his head.  Pain is not new. No numbness/tingling/weakness.  Interested in decreasing amount of narcotic and in agreement with moving to q8 dosing.  Asking about cortisone shot, but most concentrated pain at this time is in neck so pt understands this is not a good option.  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  Has a lot of smoke exposure at his new apartment and is now smoking 2-3 cig per day but would like to quit.  Trying to move apartments so he is less exposed.   HYPERTENSION BP: at goal, 106/65 Meds:coreg, lisinopril, hctz Taking meds: Yes     # of doses missed/week: 0 Symptoms: Headache: No Dizziness: No Vision changes: No SOB:  No Chest pain: No LE swelling: Yes : occasional mild LEE after taking off socks for example, not significant per pt Tobacco use: Yes    DIABETES Home CBGs: does not check Meds:metformin Taking Meds: yes # of doses missed per week: 0 Hypoglycemic episodes?: no Symptoms: Polyuria: no Polydipsia: no Parasthesias: no   Dizziness: no  Nausea:  no Vomiting:  no Last A1c: 7.1    ROS: Per HPI  History  Smoking status  . Current Everyday Smoker -- 0.2 packs/day for 52 years  . Types: Cigarettes  Smokeless tobacco  . Not on file  Comment: unable to quitt.     O:  Filed Vitals:   05/17/11 1031  BP: 106/65  Pulse: 80    Gen: NAD CV: RRR, no murmur Pulm: CTA bilat, no wheezes or crackles Ext: Warm, no edema   A/P: 65 y.o. male p/w DM, HTN, tob use, chronic pain -See problem list -f/u in 2.5 months for narcotic refill.

## 2011-05-18 NOTE — Assessment & Plan Note (Signed)
Pt restarted smoking once he was out of nursing home and in own apartment.  He states that ppl smoke right outside of his door and so he started smoking again.  Restart 5 days ago.  Only smokes 2-3 cig/days.  Trying to move so that he can quit since he does not want to keep smoking.

## 2011-05-18 NOTE — Assessment & Plan Note (Signed)
Gave pt 3 month supply of percocet 5/325 q8hr; last script to be filled 08/03/11.  Also will try lidoderm patches for local effect.  Pt needs pain medication to bridge to next appt since he thought he should still be taking percocet q6 but was only given enough for q8 and will run out before he is able to fill his next script.  Gave pt vicodin.  Will also increase effexor dose to 75 to see if this helps decrease pain.  Will try to wean to vicodin/loratab per Kristin Bruins last note while he was in nursing home.

## 2011-05-18 NOTE — Assessment & Plan Note (Signed)
BP at goal, cont current therapy.

## 2011-05-24 ENCOUNTER — Non-Acute Institutional Stay: Payer: Self-pay | Admitting: Family Medicine

## 2011-06-01 ENCOUNTER — Telehealth: Payer: Self-pay | Admitting: Family Medicine

## 2011-06-01 DIAGNOSIS — F17200 Nicotine dependence, unspecified, uncomplicated: Secondary | ICD-10-CM

## 2011-06-01 MED ORDER — NICOTINE 7 MG/24HR TD PT24: 1.0000 | MEDICATED_PATCH | TRANSDERMAL | Status: AC

## 2011-06-01 NOTE — Telephone Encounter (Signed)
I will send in Rx for nicotine patch but per Dr. Mack Hook note on his problem list for anxiety, pt's benzo is Rx'ed by outside clinic so I will not fill that. Pt should call previous prescriber for refill.

## 2011-06-01 NOTE — Telephone Encounter (Signed)
Alex Foster is requesting rx for cigarette patch and 10mg  Diazepam.  Would like to have this filled today.

## 2011-06-01 NOTE — Telephone Encounter (Signed)
Fwd. To Dr.Mcgill for refill. Alex Foster, Renato Battles

## 2011-06-02 ENCOUNTER — Encounter: Payer: Self-pay | Admitting: Family Medicine

## 2011-06-02 NOTE — Telephone Encounter (Signed)
This encounter was created in error - please disregard.

## 2011-06-02 NOTE — Telephone Encounter (Signed)
Called pt and informed of dr.mcgill's message. Pt said, that he got the diazepam from [place]. i explained to the pt that he can not have a refill from [place] and advised to schedule ov. Pt hung up the phone. Lorenda Hatchet, Renato Battles

## 2011-06-02 NOTE — Telephone Encounter (Signed)
Mr. Alex Foster would like to speak with the nurse about why the Diazepam was not also sent with the nicotine patches.  From the note below, it looks like it may have been prescribed through a different office, but he insisted that Dr. Janalyn Harder prescribed it.

## 2011-06-02 NOTE — Telephone Encounter (Signed)
Error

## 2011-06-07 ENCOUNTER — Non-Acute Institutional Stay: Payer: Self-pay | Admitting: Family Medicine

## 2011-06-29 LAB — BASIC METABOLIC PANEL
BUN: 13
BUN: 13
BUN: 9
CO2: 27
CO2: 37 — ABNORMAL HIGH
CO2: 38 — ABNORMAL HIGH
Calcium: 8.2 — ABNORMAL LOW
Calcium: 8.6
Calcium: 8.9
Chloride: 83 — ABNORMAL LOW
Chloride: 86 — ABNORMAL LOW
Creatinine, Ser: 1
Creatinine, Ser: 1.03
Creatinine, Ser: 1.1
GFR calc Af Amer: >60
GFR calc Af Amer: >60
GFR calc non Af Amer: >60
GFR calc non Af Amer: >60
GFR calc non Af Amer: >60
GFR calc non Af Amer: >60
Glucose, Bld: 117 — ABNORMAL HIGH
Glucose, Bld: 126 — ABNORMAL HIGH
Glucose, Bld: 230 — ABNORMAL HIGH
Glucose, Bld: 263 — ABNORMAL HIGH
Potassium: 4.5
Potassium: 5.1
Sodium: 123 — ABNORMAL LOW
Sodium: 130 — ABNORMAL LOW

## 2011-06-29 LAB — BLOOD GAS, ARTERIAL
O2 Saturation: 98.1
Patient temperature: 99.2
pH, Arterial: 7.423

## 2011-06-29 LAB — POCT I-STAT 3, ART BLOOD GAS (G3+)
Operator id: 297571
TCO2: 34
pCO2 arterial: 46.2 — ABNORMAL HIGH
pH, Arterial: 7.453 — ABNORMAL HIGH

## 2011-06-29 LAB — CBC
HCT: 37.7 — ABNORMAL LOW
Hemoglobin: 12.9 — ABNORMAL LOW
Hemoglobin: 13.9
MCHC: 33.5
MCHC: 34.2
MCV: 97.6
Platelets: 250
RBC: 3.87 — ABNORMAL LOW
RDW: 14.9
RDW: 15.2

## 2011-06-29 LAB — DIFFERENTIAL
Basophils Absolute: 0.1
Basophils Relative: 1
Lymphocytes Relative: 9 — ABNORMAL LOW
Monocytes Absolute: 1.2 — ABNORMAL HIGH
Neutro Abs: 8 — ABNORMAL HIGH

## 2011-06-29 LAB — POCT I-STAT, CHEM 8
Creatinine, Ser: 1
Glucose, Bld: 210 — ABNORMAL HIGH
Hemoglobin: 14.6
Potassium: 4.5

## 2011-06-29 LAB — CK TOTAL AND CKMB (NOT AT ARMC): Total CK: 109

## 2011-06-29 LAB — COMPREHENSIVE METABOLIC PANEL
BUN: 7
CO2: 28
Calcium: 8.3 — ABNORMAL LOW
Creatinine, Ser: 0.98
GFR calc non Af Amer: >60
Glucose, Bld: 201 — ABNORMAL HIGH

## 2011-06-29 LAB — B-NATRIURETIC PEPTIDE (CONVERTED LAB): Pro B Natriuretic peptide (BNP): 108 — ABNORMAL HIGH

## 2011-06-29 LAB — TROPONIN I: Troponin I: 0.03

## 2011-06-29 LAB — CULTURE, BLOOD (ROUTINE X 2)

## 2011-06-29 LAB — CARDIAC PANEL(CRET KIN+CKTOT+MB+TROPI)
CK, MB: 7.8 — ABNORMAL HIGH
Relative Index: 5.7 — ABNORMAL HIGH
Troponin I: 0.02
Troponin I: 0.03

## 2011-06-29 LAB — POCT CARDIAC MARKERS: Myoglobin, poc: 273

## 2011-06-29 LAB — URINE CULTURE: Colony Count: NO GROWTH

## 2011-06-29 LAB — SODIUM, URINE, RANDOM: Sodium, Ur: <10

## 2011-06-29 LAB — D-DIMER, QUANTITATIVE: D-Dimer, Quant: 1.73 — ABNORMAL HIGH

## 2011-07-18 ENCOUNTER — Encounter: Payer: Self-pay | Admitting: Family Medicine

## 2011-07-18 ENCOUNTER — Ambulatory Visit (INDEPENDENT_AMBULATORY_CARE_PROVIDER_SITE_OTHER): Payer: Medicaid Other | Admitting: Family Medicine

## 2011-07-18 DIAGNOSIS — R42 Dizziness and giddiness: Secondary | ICD-10-CM

## 2011-07-18 DIAGNOSIS — R609 Edema, unspecified: Secondary | ICD-10-CM

## 2011-07-18 DIAGNOSIS — R601 Generalized edema: Secondary | ICD-10-CM

## 2011-07-18 DIAGNOSIS — M545 Low back pain, unspecified: Secondary | ICD-10-CM

## 2011-07-18 DIAGNOSIS — E119 Type 2 diabetes mellitus without complications: Secondary | ICD-10-CM

## 2011-07-18 DIAGNOSIS — I10 Essential (primary) hypertension: Secondary | ICD-10-CM

## 2011-07-18 DIAGNOSIS — F17200 Nicotine dependence, unspecified, uncomplicated: Secondary | ICD-10-CM

## 2011-07-18 HISTORY — DX: Dizziness and giddiness: R42

## 2011-07-18 MED ORDER — TORSEMIDE 10 MG PO TABS: 10.0000 mg | ORAL_TABLET | Freq: Every day | ORAL | Status: DC

## 2011-07-18 MED ORDER — NICOTINE 21 MG/24HR TD PT24: 1.0000 | MEDICATED_PATCH | TRANSDERMAL | Status: DC

## 2011-07-18 MED ORDER — GLUCOSE BLOOD VI STRP: ORAL_STRIP | Status: DC

## 2011-07-18 NOTE — Patient Instructions (Addendum)
It was great to see you today!  I think the reason that you are getting dizzy is because we are lowering your blood pressure too much. KEEP TAKING: lisinopril/HCTX 20/12.5 as prescribed STOP: carvedilol (coreg) 3.125 DECREASE: torsemide (demadex) from 20mg  1x/day to 10 mg 1x/day. This will mean take HALF (1/2) tablet each day until you fill your new prescription.   I am faxing a letter to the pain clinic so that they know I agree to not prescribe narcotics for you.  I am also giving you a copy of this.  I am sending in a prescription for nicotine patches to help you quit smoking!  Come back and see me in 2-4 weeks. Come back sooner or call if you feel like your swelling in your legs is coming back.

## 2011-07-18 NOTE — Assessment & Plan Note (Signed)
Obviously orthostatic at this time.  Is on 2 diuretics (loop and thiazide).  Will d/c coreg and decrease torsemide to 10mg /day.  He thinks he was maybe taking this 2x/day but more likely he was taking his coreg BID.  Will continue current lisinopril/HCTZ.  Pt given warnings/red flags for fluid overload and stated understanding.  F/u 2 weeks for BP/ fluid status check.  Dehydration from double diuretic may be contributing to orthostasis.

## 2011-07-18 NOTE — Assessment & Plan Note (Signed)
Up to 15 cig/day (from 2-3/day 2 months ago).  Needs 21mg  nicotine patches, 7 mg was not enough.  Rx given.

## 2011-07-18 NOTE — Assessment & Plan Note (Signed)
Most likely orthostatic given vital signs.  Will decrease torsemide and d/c coreg to see if this helps.  F/u 2-4 weeks.

## 2011-07-18 NOTE — Assessment & Plan Note (Signed)
Needs new test strips  Will check A1c at next visit.  Last 02/26/11 was 7.1

## 2011-07-18 NOTE — Assessment & Plan Note (Signed)
PAIN CLINIC IS NOW IN CHARGE OF THIS-- PT IS NOT TO BE GIVEN NARCOTICS OUTSIDE OF THEIR CLINIC.

## 2011-07-18 NOTE — Progress Notes (Signed)
S: Pt comes in today for follow up.  CHRONIC PAIN Is now being seen at pain clinic and being Rx'ed 10/325's which are helping his pain much better than what he was getting here.  Needs letter faxed to them stating that I will not be prescribing narcotics for him here while he is in the clinic there ([facility]).  Ph # [phone], fax # [phone].   DIZZINESS Has been happening since Sunday (2 days ago).  First episode was when he went from sitting to standing, had to sit back down for dizziness to go away.  Stood up and again got dizzy but stood there for a few seconds and was ok.  This has been happening almost every time he tries to stand up for the past 2 days.  Has been eating and drinking well.  Occasionally dizziness happens if he moves his head too fast from side to side.  Has a little SOB, which is not new.  No chest pain.  Has not been having any LE swelling.  Urinating normal amount- including at night. No falls or confusion.  Does not feel weak, no numbness/tingling.   TOBACCO ABUSE Is now up to 15 cig/day.  Would like the 21mg  nicotine patches since the 7's are not enough to help him quit.   WEIGHT LOSS Pt is trying to eat better but has lost a significant amount of weight.  Staying away from pork and eating what he thinks is right.  Since 04/12/11 has lost 37 lbs, some of which is water weight.  (7/11: 260 --> 8/15: 240 --> 10/16: 223). NO night sweats.  Is a smoker.   ROS: Per HPI  Past Medical History  Diagnosis Date  . Stroke 01/2004    Intracebral hemorrhage in the setting of HTN  . Hyperlipidemia   . Hypertension   . Diabetes mellitus   . COPD (chronic obstructive pulmonary disease)     Active smoker, has oxygen for nightime use.  Cannot tolerate using mask.  . Low back pain     [facility]  . H/O alcohol abuse      History  Smoking status  . Current Everyday Smoker -- 0.2 packs/day for 52 years  . Types: Cigarettes  Smokeless tobacco    . Not on file  Comment: unable to quitt.     O:  Filed Vitals:   07/18/11 1529  BP: 90/65  Pulse: 101  Temp:    Orthostatic Blood Pressure: Blood pressure:   lying 121/81, sitting 106/70, standing 90/65 Pulse:   lying 94, sitting 99, standing 101   Gen: NAD, MMM CV: RRR, no murmur Pulm: CTA bilat, no wheezes or crackles Abd: soft, NT Ext: Warm, no chronic skin changes, no edema (?trace, nonpitting BLE edema)   A/P: 65 y.o. male p/w dizzines -See problem list -f/u in 2-4 weeks

## 2011-07-18 NOTE — Assessment & Plan Note (Signed)
Definitely not overloaded, arguably slightly dry since he has no edema and is not wearing any sort of compression stockings. Decrease torsemide. GIven s/s of fluid overload to watch for.

## 2011-07-20 ENCOUNTER — Telehealth: Payer: Self-pay | Admitting: Family Medicine

## 2011-07-20 DIAGNOSIS — E119 Type 2 diabetes mellitus without complications: Secondary | ICD-10-CM

## 2011-07-20 MED ORDER — ACCU-CHEK AVIVA PLUS W/DEVICE KIT: 1.0000 | PACK | Freq: Once | Status: DC

## 2011-07-20 MED ORDER — GLUCOSE BLOOD VI STRP: ORAL_STRIP | Status: DC

## 2011-07-20 NOTE — Telephone Encounter (Signed)
Pt states he has a Passenger transport manager pd for himself-but his MCD will not pay for the strips, so he needs a new meter as well. Will forward to Dr Fara Boros again.

## 2011-07-20 NOTE — Telephone Encounter (Signed)
New test strip order was sent.  This should fix the problem. Thanks!

## 2011-07-20 NOTE — Telephone Encounter (Signed)
Addended by: Demetria Pore A on: 07/20/2011 12:21 PM   Modules accepted: Orders

## 2011-07-20 NOTE — Telephone Encounter (Signed)
Order for accu chek meter sent.  I'm not sure if insurance will pay or not, but the order for the device is in.  We may still have some in the clinic but this would require him coming back if he can't get a meter at the drug store.

## 2011-07-20 NOTE — Telephone Encounter (Signed)
Will forward to Dr McGill 

## 2011-07-20 NOTE — Telephone Encounter (Signed)
Pt states that the only way his insurance will pay for his strips is for him to get the Accucheck meter and strips - pls let him know when this is done. [facility]

## 2011-08-21 ENCOUNTER — Encounter: Payer: Self-pay | Admitting: Family Medicine

## 2011-08-21 ENCOUNTER — Ambulatory Visit (INDEPENDENT_AMBULATORY_CARE_PROVIDER_SITE_OTHER): Payer: Medicaid Other | Admitting: Family Medicine

## 2011-08-21 ENCOUNTER — Telehealth: Payer: Self-pay | Admitting: Family Medicine

## 2011-08-21 VITALS — BP 142/80 | HR 80 | Ht 71.0 in | Wt 227.8 lb

## 2011-08-21 DIAGNOSIS — R42 Dizziness and giddiness: Secondary | ICD-10-CM

## 2011-08-21 DIAGNOSIS — F17200 Nicotine dependence, unspecified, uncomplicated: Secondary | ICD-10-CM

## 2011-08-21 DIAGNOSIS — Z23 Encounter for immunization: Secondary | ICD-10-CM

## 2011-08-21 DIAGNOSIS — M545 Low back pain: Secondary | ICD-10-CM

## 2011-08-21 DIAGNOSIS — E119 Type 2 diabetes mellitus without complications: Secondary | ICD-10-CM

## 2011-08-21 DIAGNOSIS — I10 Essential (primary) hypertension: Secondary | ICD-10-CM

## 2011-08-21 LAB — POCT GLYCOSYLATED HEMOGLOBIN (HGB A1C): Hemoglobin A1C: 5.9

## 2011-08-21 MED ORDER — TORSEMIDE 20 MG PO TABS: 20.0000 mg | ORAL_TABLET | Freq: Every day | ORAL | Status: DC

## 2011-08-21 MED ORDER — VENLAFAXINE HCL ER 75 MG PO CP24: 75.0000 mg | ORAL_CAPSULE | Freq: Every day | ORAL | Status: DC

## 2011-08-21 MED ORDER — LACTULOSE 10 GM/15ML PO SOLN: 20.0000 g | Freq: Two times a day (BID) | ORAL | Status: DC

## 2011-08-21 NOTE — Assessment & Plan Note (Signed)
Doing well on 21 mg nicotine patches. Down to 2-3 cigarettes per day again. Doing well. Continue to monitor.

## 2011-08-21 NOTE — Assessment & Plan Note (Signed)
A1c checked today. Last A1c 02/26/11 was 7.1. Patient currently only on metformin 1000 mg twice a day for control. Test strips were refilled at last visit.

## 2011-08-21 NOTE — Telephone Encounter (Signed)
Alex Foster called to say that the lactulose prescribed was not the same dosage he has been getting.  The rx will last for only 4 days.  Need the rx re done.  Also did not get the rx for the Zostavax and the nicotine patches.  Need to send those to his pharmacy.

## 2011-08-21 NOTE — Patient Instructions (Addendum)
Everything looks great today! We are checking your hemoglobin A1c number, which helps [place] see how well we are controlling your diabetes.  We will discuss these results at your next visit.  GO BACK TO TAKING 1 FULL TABLET of your Torsemide (20mg  total).  I sent a new refill for the medicine for the dose (this is the dose you were taking until a month ago when we decreased it).  I also sent in refills for your lactulose and Effexor.  They should be ready to be picked up this afternoon at [facility].  I also sent in a prescription for the Shingles Vaccine.  That will also be there. Please call and let me know when they give you the vaccine.   Have a great Thanksgiving!!!  Come back and see me again in 1-2 months so we can recheck your blood pressure.

## 2011-08-21 NOTE — Assessment & Plan Note (Signed)
Blood pressure no longer orthostatic. Slightly hypertensive with initial blood pressure 156/85 improved to 142/80 on recheck. Will resume torsemide dose to 20 mg. Continue holding Coreg at this time. Continue lisinopril/HCTZ. Patient slightly fluid overloaded but without crackles or pulmonary symptoms. Patient has increased 4 pounds in 4 weeks. He thinks that this is fluid. Will have patient followup in 1-2 months for blood pressure recheck and fluid status assessment.

## 2011-08-21 NOTE — Progress Notes (Signed)
S: Pt comes in today for follow up.   HYPERTENSION BP: 142/80 on recheck Meds: Lisinopril/HCTZ 20/12.5, torsemide 10 mg Taking meds: Yes     # of doses missed/week: 0 Symptoms: Headache: No Dizziness: Yes : See below Vision changes: No SOB:  No Chest pain: No LE swelling: Yes : Worse at the end of the day Tobacco use: Yes, currently working on quitting   DIZZINESS This has significantly improved since stopping his Coreg and cutting his torsemide dose in half. However, he is starting to have some lower extremity swelling. He's had a 4 pound weight gain and thinks that it is fluid. He still occasionally has dizziness with standing especially first thing in the morning, but reports that it is much better. He has not had any falls from the dizziness. When he is dizzy, he will to sit back down and wait for it to pass before standing again. He is interested in increasing his torsemide back to his original dose of 20 mg.  MOOD Patient states that he feels like his mood is good. He denies suicidal or homicidal ideation. He feels like his anger is well controlled and he is able to enjoy life for the most part. He ran out of his Effexor were a few days ago and would like a refill.  DIABETES Home CBGs: 120-180S Meds: metformin Taking Meds: yes # of doses missed per week: 0 Hypoglycemic episodes?: no Symptoms: Polyuria: no Polydipsia: no Parasthesias: yes   Dizziness: yes  Nausea:  no Vomiting:  no Last A1c: getting today   TOBACCO USE Patient has been using a 21 mg nicotine patches and feels like they're helping. He is not using any, or loss injures because she feels like it makes him want a cigarette more. Instead, he is walking around the stroke between his fingers and feels like this is greatly helping reduce his urge. He is only smoking 2-3 cigarettes per day which is down from more than a pack per day the last time I saw him. He states that he does not smoke a cigarette entirely, he just takes  few puffs and puts it out.  ROS: Per HPI  History  Smoking status  . Current Everyday Smoker -- 0.2 packs/day for 52 years  . Types: Cigarettes  Smokeless tobacco  . Not on file  Comment: unable to quitt.     O:  Filed Vitals:   08/21/11 1330  BP: 156/85  Pulse: 80    Gen: NAD, very pleasant appears in good spirits CV: RRR, no murmur Pulm: CTA bilat, no wheezes or crackles Abd: soft, NT Ext: Warm, no chronic skin changes, 1+ BLE pitting edema   A/P: 65 y.o. male p/w improved dizziness -See problem list -f/u in 1-2 months

## 2011-08-21 NOTE — Assessment & Plan Note (Signed)
Significantly improved with normalization of blood pressure. Followup in 1-2 months.

## 2011-08-22 ENCOUNTER — Other Ambulatory Visit: Payer: Self-pay | Admitting: Family Medicine

## 2011-08-22 MED ORDER — LACTULOSE 10 GM/15ML PO SOLN: 30.0000 g | Freq: Two times a day (BID) | ORAL | Status: DC

## 2011-08-22 MED ORDER — ZOSTER VACCINE LIVE 19400 UNT/0.65ML ~~LOC~~ SOLR: 0.6500 mL | Freq: Once | SUBCUTANEOUS | Status: AC

## 2011-08-22 MED ORDER — NICOTINE 21 MG/24HR TD PT24: 1.0000 | MEDICATED_PATCH | TRANSDERMAL | Status: DC

## 2011-08-22 NOTE — Telephone Encounter (Signed)
Filled

## 2011-09-07 ENCOUNTER — Other Ambulatory Visit: Payer: Self-pay | Admitting: Family Medicine

## 2011-09-07 DIAGNOSIS — E119 Type 2 diabetes mellitus without complications: Secondary | ICD-10-CM

## 2011-09-07 MED ORDER — GLUCOSE BLOOD VI STRP: ORAL_STRIP | Status: AC

## 2011-09-27 ENCOUNTER — Ambulatory Visit: Payer: Medicaid Other | Admitting: Family Medicine

## 2011-11-01 ENCOUNTER — Ambulatory Visit: Payer: Medicaid Other | Admitting: Family Medicine

## 2011-11-24 ENCOUNTER — Encounter: Payer: Self-pay | Admitting: Family Medicine

## 2011-11-24 ENCOUNTER — Ambulatory Visit (INDEPENDENT_AMBULATORY_CARE_PROVIDER_SITE_OTHER): Payer: Medicaid Other | Admitting: Family Medicine

## 2011-11-24 VITALS — BP 120/67 | HR 85 | Temp 97.5°F | Ht 71.0 in | Wt 219.1 lb

## 2011-11-24 DIAGNOSIS — E119 Type 2 diabetes mellitus without complications: Secondary | ICD-10-CM

## 2011-11-24 DIAGNOSIS — I10 Essential (primary) hypertension: Secondary | ICD-10-CM

## 2011-11-24 DIAGNOSIS — F17200 Nicotine dependence, unspecified, uncomplicated: Secondary | ICD-10-CM

## 2011-11-24 MED ORDER — ZOSTER VACCINE LIVE 19400 UNT/0.65ML ~~LOC~~ SOLR: 0.6500 mL | Freq: Once | SUBCUTANEOUS | Status: AC

## 2011-11-24 MED ORDER — ZOSTER VACCINE LIVE 19400 UNT/0.65ML ~~LOC~~ SOLR: 0.6500 mL | Freq: Once | SUBCUTANEOUS | Status: DC

## 2011-11-24 NOTE — Assessment & Plan Note (Signed)
No longer using patches, using e-cig instead.  Back up to 6 cig/day from 2-3/day in 08/2011.  Unsure what else we can do to assist him.  Will continue to encourage cessation.

## 2011-11-24 NOTE — Patient Instructions (Addendum)
It was great to see you today. Please think about quitting smoking! Let me know if there is ANY thing I can do to help you with this!   I am giving you a prescription for the SHINGLES vaccine.  We are also giving you a list of pharmacies that can give it to you. Your blood pressure and blood sugars look great!  No need for any medicine changes!  Come back and see me in about 3 months.  Don't forget to call and let us know once you have gotten the shingles vaccine so we can put it in our system.

## 2011-11-24 NOTE — Assessment & Plan Note (Signed)
Well controlled today on lisinopril/hctx 20/12.5 and torsemide 20.  Coreg currently being held, but BP good and without need for readdition at this time.  Does not appear fluid overloaded.  Weight is down, at patient's baseline.  BP Readings from Last 3 Encounters:  11/24/11 120/67  08/21/11 142/80  07/18/11 90/65

## 2011-11-24 NOTE — Progress Notes (Signed)
S: Pt comes in today for follow up.  HYPERTENSION BP: 120/67 Meds: Lisinopril/HCTZ 20/12.5, torsemide 20 mg Taking meds: Yes : would like to start taking in PM b/c it makes him feel tired    # of doses missed/week: 0 Symptoms: Headache: No Dizziness: No Vision changes: No SOB:  No Chest pain: No LE swelling: No Tobacco use: Yes; working on quitting Diet: doesn't put any extra salt on food   DIABETES Home CBGs: highest 135, lowest 89 Meds: metformin Taking Meds: yes # of doses missed per week: 0 Hypoglycemic episodes?: no  Symptoms: Polyuria: no Polydipsia: no Parasthesias: no   Dizziness: no  Nausea:  no Vomiting:  no Last A1c:  Lab Results  Component Value Date   HGBA1C 5.9 08/21/2011  -will check q3 months since well controlled on one po agent    TOBACCO ABUSE In 08/2011, pt was only smoking 2-3 cigarettes per day which was down from >1ppd; was not smoking entire cigarette, he just taking few puffs and putting it out.  He had been using a 21mg  patch, but is no longer using this.  He is now smoking 6 cig/day and using his electronic cig.  He enjoys using the electronic cigarette.  He knows he "should" quit but is unsure what the barrier is.  He feels like he was "born with a cigarette in my mouth" and states it is very hard to stop a long-standing habit.     ROS: Per HPI  History  Smoking status  . Current Everyday Smoker -- 0.2 packs/day for 52 years  . Types: Cigarettes  Smokeless tobacco  . Not on file  Comment: unable to quitt.     O:  Filed Vitals:   11/24/11 1421  BP: 120/67  Pulse: 85  Temp: 97.5 F (36.4 C)    Gen: NAD CV: RRR, no murmur Pulm: CTA bilat, no wheezes or crackles, diffuse rhonchi Abd: soft, NT, +BS, umbilical hernia Ext: Warm, no chronic skin changes, no edema FOOT EXAM NORMAL  A/P: 66 y.o. male p/w HTN, DM, tob abuse -See problem list -f/u in 3 months

## 2011-11-24 NOTE — Assessment & Plan Note (Signed)
Well controlled on metformin.  Will space A1c checks to q6 months.  F/u 3 months, will need A1c at that time.  Lab Results  Component Value Date   HGBA1C 5.9 08/21/2011

## 2011-11-27 ENCOUNTER — Telehealth: Payer: Self-pay | Admitting: Family Medicine

## 2011-11-27 NOTE — Telephone Encounter (Signed)
Patient is calling because his insurance will not pay for him to get the Shingles shot at a pharmacy as requested by Dr. Fara Boros.  He wants to know what Dr. Fara Boros wants to do.

## 2011-11-27 NOTE — Telephone Encounter (Signed)
Called pt. Pt wants Dr.McGill to find out where he can get his Shingle vaccination from. It is not covered, he went to the pharmacy. I told the pt, that he would have to call his insurance company and find out, if it is covered through the clinic or where he could get it through his insurance. Dr.McGill is unable to find this out for him. Pt states: 'Hell, I don't know. I just don't get it' and hung up the phone. Fwd. To Dr.McGill for info. Lorenda Hatchet, Renato Battles

## 2011-12-05 ENCOUNTER — Telehealth: Payer: Self-pay | Admitting: Family Medicine

## 2011-12-05 NOTE — Telephone Encounter (Signed)
Pt is asking where he can get his shingles vaccine.  Dr Fara Boros gave him a list of places to go, but none of them will accept medicaid.

## 2011-12-05 NOTE — Telephone Encounter (Signed)
Advised patient that medicaid will not pay for the vaccine.  Advised that medicare Part D plans will usually pay and private insurances .

## 2012-01-01 ENCOUNTER — Other Ambulatory Visit: Payer: Self-pay | Admitting: Family Medicine

## 2012-01-01 MED ORDER — FLUTICASONE-SALMETEROL 500-50 MCG/DOSE IN AEPB: 1.0000 | INHALATION_SPRAY | Freq: Two times a day (BID) | RESPIRATORY_TRACT | Status: DC

## 2012-01-01 NOTE — Telephone Encounter (Signed)
Refill Rx from [facility].

## 2012-01-16 ENCOUNTER — Other Ambulatory Visit: Payer: Self-pay | Admitting: Family Medicine

## 2012-01-16 DIAGNOSIS — M545 Low back pain: Secondary | ICD-10-CM

## 2012-01-16 MED ORDER — VENLAFAXINE HCL ER 75 MG PO CP24: 75.0000 mg | ORAL_CAPSULE | Freq: Every day | ORAL | Status: DC

## 2012-02-29 ENCOUNTER — Ambulatory Visit (INDEPENDENT_AMBULATORY_CARE_PROVIDER_SITE_OTHER): Payer: Medicaid Other | Admitting: Family Medicine

## 2012-02-29 ENCOUNTER — Encounter: Payer: Self-pay | Admitting: Family Medicine

## 2012-02-29 VITALS — BP 127/74 | HR 76 | Ht 71.0 in | Wt 224.0 lb

## 2012-02-29 DIAGNOSIS — Z0000 Encounter for general adult medical examination without abnormal findings: Secondary | ICD-10-CM

## 2012-02-29 DIAGNOSIS — J4489 Other specified chronic obstructive pulmonary disease: Secondary | ICD-10-CM

## 2012-02-29 DIAGNOSIS — J449 Chronic obstructive pulmonary disease, unspecified: Secondary | ICD-10-CM

## 2012-02-29 DIAGNOSIS — I10 Essential (primary) hypertension: Secondary | ICD-10-CM

## 2012-02-29 DIAGNOSIS — E119 Type 2 diabetes mellitus without complications: Secondary | ICD-10-CM

## 2012-02-29 DIAGNOSIS — E785 Hyperlipidemia, unspecified: Secondary | ICD-10-CM

## 2012-02-29 DIAGNOSIS — F17200 Nicotine dependence, unspecified, uncomplicated: Secondary | ICD-10-CM

## 2012-02-29 NOTE — Assessment & Plan Note (Signed)
A1c showing good control at 5.9 today on metformin 1000 BID.  Consider q6 month A1c checks

## 2012-02-29 NOTE — Assessment & Plan Note (Signed)
BP well controlled with lisinopril/HCTZ 20/12.5 and torsemide 10.  No fluid overload.  Coreg had been d/c'ed previously due to dizziness/hypotension. NO need to readd it back at this time.  Pt will return for CMET.

## 2012-02-29 NOTE — Progress Notes (Signed)
S: Pt comes in today for follow up.  DIABETES Home CBGs: usually 105-125, occasionally 130 Meds: metformin 1000 BID Taking Meds: yes # of doses missed per week: 0 Hypoglycemic episodes?: no Last foot exam:  Last eye exam: 01/2011 --> will make appt today Symptoms: Polyuria: no Polydipsia: no Parasthesias: no   Dizziness: no  Nausea:  no Vomiting:  no Last A1c: 5.9 today   HYPERTENSION BP: 127/74 Meds: lisinopril/HCTX 20/12.5, torsemide 10 Taking meds: Yes     # of doses missed/week: 0 Symptoms: Headache: No Dizziness: No Vision changes: No SOB:  No Chest pain: No LE swelling: No Tobacco use: Yes   HYPERLIPIDEMIA  Meds: simvastatin 40 Muscle aches: no Last FLP or LDL:  Lab Results  Component Value Date   LDLCALC  Value: 50        Total Cholesterol/HDL:CHD Risk Coronary Heart Disease Risk Table                     Men   Women  1/2 Average Risk   3.4   3.3  Average Risk       5.0   4.4  2 X Average Risk   9.6   7.1  3 X Average Risk  23.4   11.0        Use the calculated Patient Ratio above and the CHD Risk Table to determine the patient's CHD Risk.        ATP III CLASSIFICATION (LDL):  <100     mg/dL   Optimal  440-347  mg/dL   Near or Above                    Optimal  130-159  mg/dL   Borderline  425-956  mg/dL   High  >387     mg/dL   Very High 5/64/3329   Exercise: walks ~1 mile/day, 7 days/week Diet: watching salt, carbs, etc  Weight:  Wt Readings from Last 3 Encounters:  02/29/12 224 lb (101.606 kg)  11/24/11 219 lb 1 oz (99.366 kg)  08/21/11 227 lb 12.8 oz (103.329 kg)    TOBACCO USE Currently smoking 203 cig/day + using electronic cigarette "90% of the time."  NEEDS to have ONE cigarette every once in a while per pt, which is why he has not stopped completely.    ROS: Per HPI  History  Smoking status  . Current Everyday Smoker -- 0.2 packs/day for 52 years  . Types: Cigarettes  Smokeless tobacco  . Not on file  Comment: unable to quitt.     O:  Filed  Vitals:   02/29/12 1425  BP: 127/74  Pulse: 76    Gen: NAD CV: RRR, no murmur Pulm: CTA bilat, no wheezes or crackles Abd: soft, NT Ext: Warm, no chronic skin changes, no edema   A/P: 66 y.o. male p/w HTN, HLD, DM, tob use -See problem list -f/u in 6 months

## 2012-02-29 NOTE — Assessment & Plan Note (Signed)
Back down to 2-3cig/day + electronic cigarette (was back up to 6 cig/day in 11/2011). Encouraged cessation, although pt states sometimes he just HAS to have ONE regular cigarette.

## 2012-02-29 NOTE — Assessment & Plan Note (Signed)
Taking statin without issue. Will return for FLP.

## 2012-02-29 NOTE — Patient Instructions (Addendum)
It was great to see you!  Your diabetes number (A1c) and blood pressure look great today! I don't think we need to make any medicine changes.  Come back for some FASTING labs (nothing to eat or drink before you come) to check your cholesterol, kidneys, liver.  You can make a LAB ONLY appointment.  I'll send you a letter with those results.   Quit smoking!!  I don't think I need to see you for 3-6 months!  Come back sooner if you have any problems!

## 2012-02-29 NOTE — Assessment & Plan Note (Signed)
Pt reports he cannot get Zostavax b/c Medicaid won't pay for it.  He will get it in January when he turns 51 and gets Medicare. Otherwise, UTD.

## 2012-02-29 NOTE — Assessment & Plan Note (Signed)
Using Advair BID, not using Spiriva.  Not needing albuterol.

## 2012-03-08 ENCOUNTER — Other Ambulatory Visit: Payer: Medicaid Other

## 2012-03-11 ENCOUNTER — Other Ambulatory Visit: Payer: Medicaid Other

## 2012-03-13 ENCOUNTER — Other Ambulatory Visit: Payer: Medicaid Other

## 2012-03-13 DIAGNOSIS — I10 Essential (primary) hypertension: Secondary | ICD-10-CM

## 2012-03-13 DIAGNOSIS — E119 Type 2 diabetes mellitus without complications: Secondary | ICD-10-CM

## 2012-03-13 DIAGNOSIS — E785 Hyperlipidemia, unspecified: Secondary | ICD-10-CM

## 2012-03-13 LAB — LIPID PANEL
Cholesterol: 124 mg/dL (ref 0–200)
Triglycerides: 65 mg/dL (ref <150)
VLDL: 13 mg/dL (ref 0–40)

## 2012-03-13 LAB — COMPREHENSIVE METABOLIC PANEL
AST: 15 U/L (ref 0–37)
BUN: 15 mg/dL (ref 6–23)
CO2: 27 mEq/L (ref 19–32)
Calcium: 9.1 mg/dL (ref 8.4–10.5)
Chloride: 103 mEq/L (ref 96–112)
Creat: 1.15 mg/dL (ref 0.50–1.35)

## 2012-03-13 NOTE — Progress Notes (Signed)
CMP AND FLP DONE TODAY Alex Foster 

## 2012-03-14 ENCOUNTER — Encounter: Payer: Self-pay | Admitting: Family Medicine

## 2012-04-02 ENCOUNTER — Other Ambulatory Visit: Payer: Self-pay | Admitting: *Deleted

## 2012-04-02 MED ORDER — METFORMIN HCL 1000 MG PO TABS: 1000.0000 mg | ORAL_TABLET | Freq: Two times a day (BID) | ORAL | Status: DC

## 2012-04-02 MED ORDER — SIMVASTATIN 40 MG PO TABS: 40.0000 mg | ORAL_TABLET | Freq: Every day | ORAL | Status: DC

## 2012-05-10 ENCOUNTER — Encounter: Payer: Self-pay | Admitting: Family Medicine

## 2012-05-10 ENCOUNTER — Ambulatory Visit (INDEPENDENT_AMBULATORY_CARE_PROVIDER_SITE_OTHER): Payer: Medicaid Other | Admitting: Family Medicine

## 2012-05-10 VITALS — BP 147/78 | HR 69 | Ht 71.0 in | Wt 231.2 lb

## 2012-05-10 DIAGNOSIS — F17200 Nicotine dependence, unspecified, uncomplicated: Secondary | ICD-10-CM

## 2012-05-10 DIAGNOSIS — E119 Type 2 diabetes mellitus without complications: Secondary | ICD-10-CM

## 2012-05-10 DIAGNOSIS — L539 Erythematous condition, unspecified: Secondary | ICD-10-CM

## 2012-05-10 DIAGNOSIS — E785 Hyperlipidemia, unspecified: Secondary | ICD-10-CM

## 2012-05-10 DIAGNOSIS — I10 Essential (primary) hypertension: Secondary | ICD-10-CM

## 2012-05-10 MED ORDER — TORSEMIDE 20 MG PO TABS: 20.0000 mg | ORAL_TABLET | Freq: Every day | ORAL | Status: DC
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
## 2012-05-10 NOTE — Patient Instructions (Addendum)
It was good to see you today!  Your blood pressure is a little high, but we will wait to change your medicines until the next time I see you since you have had problems with them being low before.  Start taking a whole pill of your torsemide (fluid pill).   Come back to see me in about 1-2 weeks.  We will recheck your pressure, see how your diabetes is doing, and recheck your leg.

## 2012-05-11 DIAGNOSIS — L539 Erythematous condition, unspecified: Secondary | ICD-10-CM | POA: Insufficient documentation

## 2012-05-11 NOTE — Assessment & Plan Note (Signed)
Using mostly e-cigarette.  Doesn't want to quit entirely, not ready.  Continued to encourage cessation. Aware that we have resources to help if needed.

## 2012-05-11 NOTE — Assessment & Plan Note (Signed)
Historically well controlled with good home CBGs reported. Continue metformin.  Consider recheck A1c at next visit (was 5.9 on 01/2012) vs spacing to q6 month checks and checking 08/2012.

## 2012-05-11 NOTE — Assessment & Plan Note (Signed)
Previously had issues with hypotension, no mildly hypertensive (goal <130/80).  Weight is up slightly, so will increase torsemide back to 20mg  day (1 tab, was only taking 1/2 tab).  If this does not help BP, consider readding coreg at next visit.  Recheck in 1-2 weeks.  Most recent CMET 2 months ago WNL.  Wt Readings from Last 3 Encounters:  05/10/12 231 lb 3.2 oz (104.872 kg)  02/29/12 224 lb (101.606 kg)  11/24/11 219 lb 1 oz (99.366 kg)   BP Readings from Last 3 Encounters:  05/10/12 147/78  02/29/12 127/74  11/24/11 120/67
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
## 2012-05-11 NOTE — Assessment & Plan Note (Signed)
Tolerating simvastatin.  FLP 03/2012 showing LDL of 96, which is at goal (goal <100). Continue statin.

## 2012-05-24 ENCOUNTER — Encounter: Payer: Self-pay | Admitting: Family Medicine

## 2012-05-24 ENCOUNTER — Ambulatory Visit (INDEPENDENT_AMBULATORY_CARE_PROVIDER_SITE_OTHER): Payer: Medicaid Other | Admitting: Family Medicine

## 2012-05-24 VITALS — BP 148/84 | HR 76 | Ht 71.0 in | Wt 234.0 lb

## 2012-05-24 DIAGNOSIS — I10 Essential (primary) hypertension: Secondary | ICD-10-CM

## 2012-05-24 DIAGNOSIS — F17200 Nicotine dependence, unspecified, uncomplicated: Secondary | ICD-10-CM

## 2012-05-24 DIAGNOSIS — L539 Erythematous condition, unspecified: Secondary | ICD-10-CM

## 2012-05-24 DIAGNOSIS — E119 Type 2 diabetes mellitus without complications: Secondary | ICD-10-CM

## 2012-05-24 LAB — POCT GLYCOSYLATED HEMOGLOBIN (HGB A1C): Hemoglobin A1C: 6.1

## 2012-05-24 MED ORDER — CARVEDILOL 3.125 MG PO TABS: 3.1250 mg | ORAL_TABLET | Freq: Two times a day (BID) | ORAL | Status: DC

## 2012-05-24 NOTE — Assessment & Plan Note (Signed)
H/o issues with hypotension and all BP meds had been d/c'ed.  Now lisinopril/HCTZ 20/12.5 and torsemide 10 have been added back.  BP remains elevated.  Will readd coreg.  Pt w/ some concerns about hypotension, but I reassured pt that coreg is low dose.  Pt to f/u w/in 1 month for BP recheck.  Also of note, pt with increasing weights and what I think is slightly worse asictes today than previously, though patient doesn't feel like his abdomen is more swollen than usual.  He has h/o heavy alcohol use and hepatic encephalopathy.  We increased his torsemide from 5-10mg  at last visit 05/11/12.  He does not feel like he is dehydrated. His weights have not improved yet.  Will hold on increasing diuretic since adding back coreg today.   BP Readings from Last 3 Encounters:  05/24/12 148/84  05/10/12 147/78  02/29/12 127/74   Wt Readings from Last 3 Encounters:  05/24/12 234 lb (106.142 kg)  05/10/12 231 lb 3.2 oz (104.872 kg)  02/29/12 224 lb (101.606 kg)

## 2012-05-24 NOTE — Assessment & Plan Note (Addendum)
Good control. Check A1c, then space to q6 months if <6 still.  Continue metformin. Patient due for eye exam.  Needs foot exam in 11/2012.

## 2012-05-24 NOTE — Patient Instructions (Addendum)
It was good to see you today.  I'm glad your leg is looking better.  Try to cut down on the amount of Coke Zero you are drinking.   Your sugars are doing great.  We rechecked your hemoglobin A1c today.  Keep taking your metformin.   Since your blood pressure is still a little high, we are going to restart your coreg (I sent it to the pharmacy)-- it's 2 times per day.  Keep taking the whole torsemide pill and the lisinopril/HCTZ.  Come back and see me in 1 month.

## 2012-05-24 NOTE — Assessment & Plan Note (Signed)
Continues to slowly cut down.  <1 pack per week now.  Encouraged pt to continue.
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
## 2012-05-31 ENCOUNTER — Encounter (HOSPITAL_COMMUNITY): Payer: Self-pay | Admitting: Anesthesiology

## 2012-05-31 ENCOUNTER — Emergency Department (HOSPITAL_COMMUNITY): Payer: Medicaid Other

## 2012-05-31 ENCOUNTER — Inpatient Hospital Stay (HOSPITAL_COMMUNITY)
Admission: EM | Admit: 2012-05-31 | Discharge: 2012-08-08 | DRG: 004 | Disposition: A | Discharge status: Rehabilitation Facility | Payer: Medicaid Other | Attending: General Surgery | Admitting: General Surgery

## 2012-05-31 ENCOUNTER — Inpatient Hospital Stay (HOSPITAL_COMMUNITY): Payer: Medicaid Other

## 2012-05-31 DIAGNOSIS — E871 Hypo-osmolality and hyponatremia: Secondary | ICD-10-CM | POA: Diagnosis not present

## 2012-05-31 DIAGNOSIS — S2249XA Multiple fractures of ribs, unspecified side, initial encounter for closed fracture: Secondary | ICD-10-CM

## 2012-05-31 DIAGNOSIS — K429 Umbilical hernia without obstruction or gangrene: Secondary | ICD-10-CM | POA: Diagnosis present

## 2012-05-31 DIAGNOSIS — I82A11 Acute embolism and thrombosis of right axillary vein: Secondary | ICD-10-CM | POA: Diagnosis not present

## 2012-05-31 DIAGNOSIS — I6350 Cerebral infarction due to unspecified occlusion or stenosis of unspecified cerebral artery: Secondary | ICD-10-CM | POA: Diagnosis not present

## 2012-05-31 DIAGNOSIS — D62 Acute posthemorrhagic anemia: Secondary | ICD-10-CM

## 2012-05-31 DIAGNOSIS — R404 Transient alteration of awareness: Secondary | ICD-10-CM | POA: Diagnosis not present

## 2012-05-31 DIAGNOSIS — I469 Cardiac arrest, cause unspecified: Secondary | ICD-10-CM

## 2012-05-31 DIAGNOSIS — I82B19 Acute embolism and thrombosis of unspecified subclavian vein: Secondary | ICD-10-CM | POA: Diagnosis not present

## 2012-05-31 DIAGNOSIS — I441 Atrioventricular block, second degree: Secondary | ICD-10-CM

## 2012-05-31 DIAGNOSIS — S02401A Maxillary fracture, unspecified, initial encounter for closed fracture: Secondary | ICD-10-CM | POA: Diagnosis present

## 2012-05-31 DIAGNOSIS — E873 Alkalosis: Secondary | ICD-10-CM | POA: Diagnosis not present

## 2012-05-31 DIAGNOSIS — S0292XA Unspecified fracture of facial bones, initial encounter for closed fracture: Secondary | ICD-10-CM

## 2012-05-31 DIAGNOSIS — I10 Essential (primary) hypertension: Secondary | ICD-10-CM | POA: Diagnosis present

## 2012-05-31 DIAGNOSIS — J209 Acute bronchitis, unspecified: Secondary | ICD-10-CM | POA: Diagnosis not present

## 2012-05-31 DIAGNOSIS — I4510 Unspecified right bundle-branch block: Secondary | ICD-10-CM | POA: Diagnosis not present

## 2012-05-31 DIAGNOSIS — Z23 Encounter for immunization: Secondary | ICD-10-CM

## 2012-05-31 DIAGNOSIS — R339 Retention of urine, unspecified: Secondary | ICD-10-CM | POA: Diagnosis not present

## 2012-05-31 DIAGNOSIS — S02109A Fracture of base of skull, unspecified side, initial encounter for closed fracture: Secondary | ICD-10-CM | POA: Diagnosis present

## 2012-05-31 DIAGNOSIS — G8190 Hemiplegia, unspecified affecting unspecified side: Secondary | ICD-10-CM | POA: Diagnosis present

## 2012-05-31 DIAGNOSIS — IMO0002 Reserved for concepts with insufficient information to code with codable children: Secondary | ICD-10-CM | POA: Diagnosis present

## 2012-05-31 DIAGNOSIS — J168 Pneumonia due to other specified infectious organisms: Secondary | ICD-10-CM | POA: Diagnosis not present

## 2012-05-31 DIAGNOSIS — S069X9A Unspecified intracranial injury with loss of consciousness of unspecified duration, initial encounter: Secondary | ICD-10-CM

## 2012-05-31 DIAGNOSIS — S0280XA Fracture of other specified skull and facial bones, unspecified side, initial encounter for closed fracture: Secondary | ICD-10-CM | POA: Diagnosis present

## 2012-05-31 DIAGNOSIS — N179 Acute kidney failure, unspecified: Secondary | ICD-10-CM | POA: Diagnosis present

## 2012-05-31 DIAGNOSIS — S06309A Unspecified focal traumatic brain injury with loss of consciousness of unspecified duration, initial encounter: Secondary | ICD-10-CM | POA: Diagnosis present

## 2012-05-31 DIAGNOSIS — S069XAA Unspecified intracranial injury with loss of consciousness status unknown, initial encounter: Secondary | ICD-10-CM

## 2012-05-31 DIAGNOSIS — J95821 Acute postprocedural respiratory failure: Secondary | ICD-10-CM

## 2012-05-31 DIAGNOSIS — M129 Arthropathy, unspecified: Secondary | ICD-10-CM | POA: Diagnosis present

## 2012-05-31 DIAGNOSIS — S270XXA Traumatic pneumothorax, initial encounter: Secondary | ICD-10-CM | POA: Diagnosis present

## 2012-05-31 DIAGNOSIS — I491 Atrial premature depolarization: Secondary | ICD-10-CM | POA: Diagnosis present

## 2012-05-31 DIAGNOSIS — T1490XA Injury, unspecified, initial encounter: Secondary | ICD-10-CM

## 2012-05-31 DIAGNOSIS — Z5181 Encounter for therapeutic drug level monitoring: Secondary | ICD-10-CM

## 2012-05-31 DIAGNOSIS — J90 Pleural effusion, not elsewhere classified: Secondary | ICD-10-CM

## 2012-05-31 DIAGNOSIS — S0180XA Unspecified open wound of other part of head, initial encounter: Secondary | ICD-10-CM | POA: Diagnosis present

## 2012-05-31 DIAGNOSIS — J398 Other specified diseases of upper respiratory tract: Secondary | ICD-10-CM | POA: Diagnosis not present

## 2012-05-31 DIAGNOSIS — J9819 Other pulmonary collapse: Secondary | ICD-10-CM | POA: Diagnosis not present

## 2012-05-31 DIAGNOSIS — J9601 Acute respiratory failure with hypoxia: Secondary | ICD-10-CM

## 2012-05-31 DIAGNOSIS — D696 Thrombocytopenia, unspecified: Secondary | ICD-10-CM | POA: Diagnosis not present

## 2012-05-31 DIAGNOSIS — Y92410 Unspecified street and highway as the place of occurrence of the external cause: Secondary | ICD-10-CM

## 2012-05-31 DIAGNOSIS — E119 Type 2 diabetes mellitus without complications: Secondary | ICD-10-CM | POA: Diagnosis present

## 2012-05-31 DIAGNOSIS — E872 Acidosis, unspecified: Secondary | ICD-10-CM | POA: Diagnosis not present

## 2012-05-31 DIAGNOSIS — S020XXA Fracture of vault of skull, initial encounter for closed fracture: Principal | ICD-10-CM | POA: Diagnosis present

## 2012-05-31 DIAGNOSIS — D72829 Elevated white blood cell count, unspecified: Secondary | ICD-10-CM | POA: Diagnosis present

## 2012-05-31 DIAGNOSIS — I498 Other specified cardiac arrhythmias: Secondary | ICD-10-CM | POA: Diagnosis not present

## 2012-05-31 DIAGNOSIS — S066X9A Traumatic subarachnoid hemorrhage with loss of consciousness of unspecified duration, initial encounter: Secondary | ICD-10-CM

## 2012-05-31 DIAGNOSIS — J9600 Acute respiratory failure, unspecified whether with hypoxia or hypercapnia: Secondary | ICD-10-CM | POA: Diagnosis present

## 2012-05-31 DIAGNOSIS — E46 Unspecified protein-calorie malnutrition: Secondary | ICD-10-CM | POA: Diagnosis not present

## 2012-05-31 DIAGNOSIS — I252 Old myocardial infarction: Secondary | ICD-10-CM

## 2012-05-31 DIAGNOSIS — R570 Cardiogenic shock: Secondary | ICD-10-CM

## 2012-05-31 DIAGNOSIS — S02400A Malar fracture unspecified, initial encounter for closed fracture: Secondary | ICD-10-CM | POA: Diagnosis present

## 2012-05-31 DIAGNOSIS — D638 Anemia in other chronic diseases classified elsewhere: Secondary | ICD-10-CM | POA: Diagnosis present

## 2012-05-31 DIAGNOSIS — J189 Pneumonia, unspecified organism: Secondary | ICD-10-CM

## 2012-05-31 DIAGNOSIS — Y998 Other external cause status: Secondary | ICD-10-CM

## 2012-05-31 DIAGNOSIS — S022XXA Fracture of nasal bones, initial encounter for closed fracture: Secondary | ICD-10-CM | POA: Diagnosis present

## 2012-05-31 DIAGNOSIS — T794XXA Traumatic shock, initial encounter: Secondary | ICD-10-CM | POA: Diagnosis present

## 2012-05-31 HISTORY — DX: Acute myocardial infarction, unspecified: I21.9

## 2012-05-31 HISTORY — DX: Unspecified fracture of facial bones, initial encounter for closed fracture: S02.92XA

## 2012-05-31 HISTORY — DX: Unspecified intracranial injury with loss of consciousness of unspecified duration, initial encounter: S06.9X9A

## 2012-05-31 HISTORY — DX: Multiple fractures of ribs, unspecified side, initial encounter for closed fracture: S22.49XA

## 2012-05-31 HISTORY — DX: Pneumonia, unspecified organism: J18.9

## 2012-05-31 HISTORY — DX: Unspecified intracranial injury with loss of consciousness status unknown, initial encounter: S06.9XAA

## 2012-05-31 LAB — COMPREHENSIVE METABOLIC PANEL
ALT: 23 U/L (ref 0–53)
AST: 30 U/L (ref 0–37)
CO2: 25 mEq/L (ref 19–32)
Calcium: 8.7 mg/dL (ref 8.4–10.5)
Chloride: 95 mEq/L — ABNORMAL LOW (ref 96–112)
GFR calc non Af Amer: 54 mL/min — ABNORMAL LOW (ref >90)
Sodium: 133 mEq/L — ABNORMAL LOW (ref 135–145)
Total Bilirubin: 0.4 mg/dL (ref 0.3–1.2)

## 2012-05-31 LAB — POCT I-STAT, CHEM 8
BUN: 28 mg/dL — ABNORMAL HIGH (ref 6–23)
Chloride: 100 mEq/L (ref 96–112)
Creatinine, Ser: 1.5 mg/dL — ABNORMAL HIGH (ref 0.50–1.35)
Glucose, Bld: 337 mg/dL — ABNORMAL HIGH (ref 70–99)
HCT: 34 % — ABNORMAL LOW (ref 39.0–52.0)
Hemoglobin: 11.6 g/dL — ABNORMAL LOW (ref 13.0–17.0)
Sodium: 136 mEq/L (ref 135–145)
Sodium: 135 mEq/L (ref 135–145)
TCO2: 23 mmol/L (ref 0–100)

## 2012-05-31 LAB — PROTIME-INR
INR: 1.14 (ref 0.00–1.49)
Prothrombin Time: 14.8 seconds (ref 11.6–15.2)

## 2012-05-31 LAB — CBC
Hemoglobin: 13 g/dL (ref 13.0–17.0)
MCH: 29.8 pg (ref 26.0–34.0)
MCHC: 32.5 g/dL (ref 30.0–36.0)

## 2012-05-31 LAB — CDS SEROLOGY

## 2012-05-31 LAB — LACTIC ACID, PLASMA: Lactic Acid, Venous: 3.4 mmol/L — ABNORMAL HIGH (ref 0.5–2.2)

## 2012-05-31 LAB — GLUCOSE, CAPILLARY: Glucose-Capillary: 340 mg/dL — ABNORMAL HIGH (ref 70–99)

## 2012-05-31 MED ORDER — FENTANYL BOLUS VIA INFUSION
50.0000 ug | Freq: Four times a day (QID) | INTRAVENOUS | Status: DC | PRN
  Administered 2012-06-25: 50 ug via INTRAVENOUS
  Filled 2012-05-31 (×2): qty 100

## 2012-05-31 MED ORDER — CHLORHEXIDINE GLUCONATE 0.12 % MT SOLN
15.0000 mL | Freq: Two times a day (BID) | OROMUCOSAL | Status: DC
  Administered 2012-06-01 – 2012-07-02 (×64): 15 mL via OROMUCOSAL
  Filled 2012-05-31 (×66): qty 15

## 2012-05-31 MED ORDER — SODIUM CHLORIDE 0.9 % IV SOLN
50.0000 ug/h | INTRAVENOUS | Status: DC
  Administered 2012-05-31 – 2012-06-01 (×2): 100 ug/h via INTRAVENOUS
  Administered 2012-06-02 – 2012-06-04 (×3): 150 ug/h via INTRAVENOUS
  Administered 2012-06-07 – 2012-06-08 (×2): 75 ug/h via INTRAVENOUS
  Administered 2012-06-10: 100 ug/h via INTRAVENOUS
  Administered 2012-06-10: 125 ug/h via INTRAVENOUS
  Administered 2012-06-11: 150 ug/h via INTRAVENOUS
  Administered 2012-06-18 – 2012-06-20 (×2): 50 ug/h via INTRAVENOUS
  Administered 2012-06-21: 75 ug/h via INTRAVENOUS
  Administered 2012-06-23: 50 ug/h via INTRAVENOUS
  Administered 2012-06-25: 60 ug/h via INTRAVENOUS
  Administered 2012-06-25: 75 ug/h via INTRAVENOUS
  Filled 2012-05-31 (×20): qty 50

## 2012-05-31 MED ORDER — PROPOFOL 10 MG/ML IV EMUL
5.0000 ug/kg/min | INTRAVENOUS | Status: DC
  Administered 2012-05-31: 10 ug/kg/min via INTRAVENOUS
  Administered 2012-06-01 – 2012-06-02 (×3): 15 ug/kg/min via INTRAVENOUS
  Filled 2012-05-31 (×4): qty 100

## 2012-05-31 MED ORDER — IOHEXOL 300 MG/ML  SOLN
100.0000 mL | Freq: Once | INTRAMUSCULAR | Status: AC | PRN
  Administered 2012-05-31: 100 mL via INTRAVENOUS

## 2012-05-31 MED ORDER — INSULIN ASPART 100 UNIT/ML ~~LOC~~ SOLN
0.0000 [IU] | SUBCUTANEOUS | Status: DC
  Administered 2012-05-31 – 2012-06-01 (×2): 11 [IU] via SUBCUTANEOUS

## 2012-05-31 MED ORDER — ATROPINE SULFATE 1 MG/ML IJ SOLN
INTRAMUSCULAR | Status: AC | PRN
  Administered 2012-05-31: 1 mg via INTRAVENOUS

## 2012-05-31 MED ORDER — EPINEPHRINE HCL 1 MG/ML IJ SOLN
0.5000 ug/min | INTRAVENOUS | Status: DC
  Filled 2012-05-31: qty 1

## 2012-05-31 MED ORDER — ETOMIDATE 2 MG/ML IV SOLN
INTRAVENOUS | Status: AC | PRN
  Administered 2012-05-31: 20 mg via INTRAVENOUS

## 2012-05-31 MED ORDER — MIDAZOLAM HCL 2 MG/2ML IJ SOLN
INTRAMUSCULAR | Status: AC
  Administered 2012-05-31: 2 mg via INTRAVENOUS
  Filled 2012-05-31: qty 4

## 2012-05-31 MED ORDER — DEXTROSE-NACL 5-0.9 % IV SOLN
INTRAVENOUS | Status: DC
  Administered 2012-05-31 – 2012-06-02 (×3): via INTRAVENOUS

## 2012-05-31 MED ORDER — EPINEPHRINE HCL 1 MG/ML IJ SOLN
0.5000 ug/min | INTRAVENOUS | Status: DC
  Administered 2012-05-31 (×2): 40 ug/min via INTRAVENOUS
  Administered 2012-06-01 (×2): 25 ug/min via INTRAVENOUS
  Administered 2012-06-01: 30 ug/min via INTRAVENOUS
  Administered 2012-06-01 – 2012-06-02 (×5): 20 ug/min via INTRAVENOUS
  Filled 2012-05-31 (×12): qty 4

## 2012-05-31 MED ORDER — NOREPINEPHRINE BITARTRATE 1 MG/ML IJ SOLN
2.0000 ug/min | INTRAVENOUS | Status: DC
  Administered 2012-05-31 (×2): 5 ug/min via INTRAVENOUS
  Administered 2012-06-01: 14 ug/min via INTRAVENOUS
  Administered 2012-06-02 (×3): 25 ug/min via INTRAVENOUS
  Administered 2012-06-02: 30 ug/min via INTRAVENOUS
  Administered 2012-06-03: 20 ug/min via INTRAVENOUS
  Administered 2012-06-03: 16 ug/min via INTRAVENOUS
  Administered 2012-06-03: 18 ug/min via INTRAVENOUS
  Administered 2012-06-04: 12 ug/min via INTRAVENOUS
  Filled 2012-05-31 (×13): qty 8

## 2012-05-31 MED ORDER — EPINEPHRINE HCL 0.1 MG/ML IJ SOLN
INTRAMUSCULAR | Status: AC | PRN
  Administered 2012-05-31 (×5): 1 via INTRAVENOUS

## 2012-05-31 MED ORDER — ONDANSETRON HCL 4 MG PO TABS: 4.0000 mg | ORAL_TABLET | Freq: Four times a day (QID) | ORAL | Status: DC | PRN

## 2012-05-31 MED ORDER — PANTOPRAZOLE SODIUM 40 MG IV SOLR
40.0000 mg | Freq: Every day | INTRAVENOUS | Status: DC
  Administered 2012-06-01 – 2012-06-06 (×6): 40 mg via INTRAVENOUS
  Filled 2012-05-31 (×7): qty 40

## 2012-05-31 MED ORDER — ONDANSETRON HCL 4 MG/2ML IJ SOLN: 4.0000 mg | Freq: Four times a day (QID) | INTRAMUSCULAR | Status: DC | PRN

## 2012-05-31 MED ORDER — BIOTENE DRY MOUTH MT LIQD
15.0000 mL | OROMUCOSAL | Status: DC
  Administered 2012-05-31 – 2012-06-01 (×2): 15 mL via OROMUCOSAL

## 2012-05-31 MED ORDER — MIDAZOLAM HCL 2 MG/2ML IJ SOLN
INTRAMUSCULAR | Status: AC
  Administered 2012-05-31: 2 mg via INTRAVENOUS
  Filled 2012-05-31: qty 2

## 2012-05-31 MED ORDER — EPINEPHRINE HCL 1 MG/ML IJ SOLN
0.5000 ug/min | INTRAVENOUS | Status: AC
  Administered 2012-05-31: 5 ug/min via INTRAVENOUS
  Filled 2012-05-31: qty 1

## 2012-05-31 MED ORDER — PANTOPRAZOLE SODIUM 40 MG PO TBEC: 40.0000 mg | DELAYED_RELEASE_TABLET | Freq: Every day | ORAL | Status: DC

## 2012-05-31 MED ORDER — VECURONIUM BROMIDE 10 MG IV SOLR
INTRAVENOUS | Status: AC
  Filled 2012-05-31: qty 10

## 2012-05-31 MED ORDER — VECURONIUM BROMIDE 10 MG IV SOLR
INTRAVENOUS | Status: AC | PRN
  Administered 2012-05-31: 10 mg via INTRAVENOUS

## 2012-05-31 NOTE — Code Documentation (Addendum)
3rd unit of blood (first typed unit) hung on level 1  Unit W2012 13 017785 Volume 350 Rate 999

## 2012-05-31 NOTE — Code Documentation (Addendum)
1st unit FFP hung on central line  Unit W2012 13 013610 Volume 306 Rate 999
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
## 2012-05-31 NOTE — ED Notes (Signed)
CXR done

## 2012-05-31 NOTE — ED Notes (Signed)
Dr Noodleman to bedside  
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
## 2012-05-31 NOTE — Code Documentation (Signed)
Lac to forehead stapled by Alfredia Ferguson, PA

## 2012-05-31 NOTE — ED Notes (Signed)
Pt to CT with 2 RNs, RT and monitor
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
## 2012-05-31 NOTE — Code Documentation (Signed)
Pulses present  

## 2012-05-31 NOTE — Procedures (Addendum)
Chest Tube Insertion Procedure Note  Indications:  Clinically significant Pneumothorax  Pre-operative Diagnosis: Pneumothorax And palpable rib fractures Bilaterally  Post-operative Diagnosis: Pneumothorax And palpable rib fractures Bilaterally  Procedure Details  Please note left chest tube was placed by emergency department physician. I performed a right chest tube insertion 28 [place]. Informed consent was obtained for the procedure, including sedation.  Risks of lung perforation, hemorrhage, arrhythmia, and adverse drug reaction were discussed.   After sterile skin prep, using standard technique, a 28 French tube was placed in the right Anterior axillary line at the nipple level. Findings: Lungs somewhat adherent to the inside of chest wall, large right chest wall deformity, small hemothorax, small rush of air.  Estimated Blood Loss:  less than 50 mL         Specimens:  None              Complications:  None however patient remains medically unstable with CPR         Disposition: remained undergoing resuscitation in the trauma bay         Condition: unstable  Violeta Gelinas, MD, MPH, FACS Pager: [phone]

## 2012-05-31 NOTE — Code Documentation (Addendum)
4th unit of blood O pos hung on pressure bag  Unit W2012 13 017792 Volume 350 Rate 999
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
## 2012-05-31 NOTE — ED Notes (Addendum)
Unit #2 FFP hung  Unit W2012 13 [id number] Volume 314 Rate 999

## 2012-05-31 NOTE — Code Documentation (Signed)
Compression begun
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
## 2012-05-31 NOTE — Code Documentation (Signed)
Dr Janee Morn putting in central line in right femoral

## 2012-05-31 NOTE — ED Notes (Signed)
Multiple RNs have remained at pt's bedside for the duration of his ED stay, pt has remained on full CR monitor at all times

## 2012-05-31 NOTE — Code Documentation (Addendum)
2nd unit O neg blood started on central line  Unit W2012 13 [id number] Volume 350 Rate 999

## 2012-05-31 NOTE — Code Documentation (Signed)
Compressions started.

## 2012-05-31 NOTE — ED Notes (Signed)
Vital signs stable. 

## 2012-05-31 NOTE — Code Documentation (Addendum)
1st unit O neg blood started in right arm  UNIT # [id number] 13 [id number] VOLUME 350 RATE 999

## 2012-05-31 NOTE — Code Documentation (Signed)
1st unit of blood finished 

## 2012-05-31 NOTE — ED Notes (Signed)
Family updated by Dr Janee Morn

## 2012-05-31 NOTE — Procedures (Signed)
Central Venous Catheter Insertion Procedure Note Alex Foster [id number] August 20, 1946  Procedure: Insertion of Central Venous Catheter Indications: shock  Procedure Details Consent: Unable to obtain consent because of altered level of consciousness. Time Out: Verified patient identification, verified procedure, site/side was marked, verified correct patient position, special equipment/implants available, medications/allergies/relevent history reviewed, required imaging and test results available.  Performed  Maximum sterile technique was used including antiseptics, cap, gloves, gown, hand hygiene and mask. Skin prep: Iodine solution; local anesthetic administered A antimicrobial bonded/coated triple lumen catheter was placed in the right femoral vein due to emergent situation using the Seldinger technique.  Evaluation Blood flow good Complications: No apparent complications Patient did tolerate procedure well. Chest X-ray ordered to verify placement.  CXR: na.  Alex Foster 05/31/2012, 6:12 PM
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
## 2012-06-01 ENCOUNTER — Inpatient Hospital Stay (HOSPITAL_COMMUNITY): Payer: Medicaid Other

## 2012-06-01 ENCOUNTER — Other Ambulatory Visit (HOSPITAL_COMMUNITY): Payer: Medicaid Other

## 2012-06-01 ENCOUNTER — Encounter (HOSPITAL_COMMUNITY): Payer: Self-pay | Admitting: *Deleted

## 2012-06-01 DIAGNOSIS — E872 Acidosis, unspecified: Secondary | ICD-10-CM

## 2012-06-01 DIAGNOSIS — D72829 Elevated white blood cell count, unspecified: Secondary | ICD-10-CM | POA: Diagnosis present

## 2012-06-01 HISTORY — DX: Acidosis, unspecified: E87.20

## 2012-06-01 HISTORY — DX: Acidosis: E87.2

## 2012-06-01 HISTORY — DX: Elevated white blood cell count, unspecified: D72.829

## 2012-06-01 LAB — CBC
MCH: 30.3 pg (ref 26.0–34.0)
MCHC: 34.3 g/dL (ref 30.0–36.0)
Platelets: 151 10*3/uL (ref 150–400)
RBC: 3.1 MIL/uL — ABNORMAL LOW (ref 4.22–5.81)

## 2012-06-01 LAB — GLUCOSE, CAPILLARY
Glucose-Capillary: 163 mg/dL — ABNORMAL HIGH (ref 70–99)
Glucose-Capillary: 199 mg/dL — ABNORMAL HIGH (ref 70–99)
Glucose-Capillary: 217 mg/dL — ABNORMAL HIGH (ref 70–99)
Glucose-Capillary: 335 mg/dL — ABNORMAL HIGH (ref 70–99)
Glucose-Capillary: 354 mg/dL — ABNORMAL HIGH (ref 70–99)
Glucose-Capillary: 365 mg/dL — ABNORMAL HIGH (ref 70–99)
Glucose-Capillary: 87 mg/dL (ref 70–99)

## 2012-06-01 LAB — BASIC METABOLIC PANEL
Calcium: 7.1 mg/dL — ABNORMAL LOW (ref 8.4–10.5)
GFR calc non Af Amer: 33 mL/min — ABNORMAL LOW (ref >90)
Sodium: 131 mEq/L — ABNORMAL LOW (ref 135–145)

## 2012-06-01 LAB — URINALYSIS, MICROSCOPIC ONLY
Bilirubin Urine: NEGATIVE
Glucose, UA: 100 mg/dL — AB
Ketones, ur: NEGATIVE mg/dL
Leukocytes, UA: NEGATIVE
Protein, ur: 30 mg/dL — AB

## 2012-06-01 LAB — PREPARE FRESH FROZEN PLASMA: Unit division: 0

## 2012-06-01 LAB — PROTIME-INR
INR: 1.38 (ref 0.00–1.49)
Prothrombin Time: 17.2 seconds — ABNORMAL HIGH (ref 11.6–15.2)

## 2012-06-01 LAB — HEMOGLOBIN A1C: Hgb A1c MFr Bld: 5.9 % — ABNORMAL HIGH (ref <5.7)

## 2012-06-01 MED ORDER — PHENYLEPHRINE HCL 10 MG/ML IJ SOLN
30.0000 ug/min | INTRAVENOUS | Status: DC
  Filled 2012-06-01: qty 4

## 2012-06-01 MED ORDER — BIOTENE DRY MOUTH MT LIQD
15.0000 mL | OROMUCOSAL | Status: DC
  Administered 2012-06-01 – 2012-07-02 (×361): 15 mL via OROMUCOSAL

## 2012-06-01 MED ORDER — SODIUM CHLORIDE 0.9 % IV SOLN
INTRAVENOUS | Status: DC
  Administered 2012-06-01: 3.1 [IU]/h via INTRAVENOUS
  Administered 2012-06-01: 12.6 [IU]/h via INTRAVENOUS
  Administered 2012-06-02: 15:00:00 via INTRAVENOUS
  Filled 2012-06-01 (×3): qty 1

## 2012-06-01 MED ORDER — MIDAZOLAM HCL 2 MG/2ML IJ SOLN: 2.0000 mg | INTRAMUSCULAR | Status: DC | PRN
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
## 2012-06-01 NOTE — Progress Notes (Signed)
Patient ID: Alex Foster, male   DOB: 1946-06-18, 66 y.o.   MRN: [id number] Follow up - Trauma and Critical Care  Patient Details:    Alex Foster is an 66 y.o. male.  Lines/tubes : Airway 7.5 mm (Active)  Secured at (cm) 24 cm 06/01/2012  7:47 AM  Measured From Lips 06/01/2012  7:47 AM  Secured Location Center 06/01/2012  7:47 AM  Secured By [employer] 06/01/2012  7:47 AM  Tube Holder Repositioned Yes 06/01/2012  7:47 AM  Cuff Pressure (cm H2O) 24 cm H2O 05/31/2012  8:42 PM  Site Condition Dry 05/31/2012  4:00 PM     CVC Triple Lumen 05/31/12 Right Femoral (Active)  Indication for Insertion or Continuance of Line Vasoactive infusions 06/01/2012  8:00 AM  Dressing Type Transparent 06/01/2012  8:00 AM  Dressing Status Clean;Dry;Intact 06/01/2012  8:00 AM     Arterial Line 05/31/12 Left Radial (Active)  Line Status Pulsatile blood flow 06/01/2012  8:00 AM  Art Line Waveform Appropriate 06/01/2012  8:00 AM  Dressing Type Transparent 06/01/2012  8:00 AM  Dressing Status Clean;Dry;Intact 06/01/2012  8:00 AM     Chest Tube Right (Active)  Suction -20 cm H2O 06/01/2012  8:00 AM  Chest Tube Air Leak None 06/01/2012  8:00 AM  Drainage Description Bright red 06/01/2012  8:00 AM  Dressing Status Clean;Dry;Intact 06/01/2012  8:00 AM  Dressing Intervention Dressing changed 05/31/2012 11:30 PM  Site Assessment Clean;Dry;Intact 05/31/2012 11:30 PM  Surrounding Skin Intact 05/31/2012 11:30 PM  Output (mL) 100 mL 06/01/2012  6:30 AM     Chest Tube Left (Active)  Suction -20 cm H2O 06/01/2012  8:00 AM  Chest Tube Air Leak None 06/01/2012  8:00 AM  Drainage Description Bright red 06/01/2012  8:00 AM  Dressing Status Clean;Dry;Intact 06/01/2012  8:00 AM  Dressing Intervention Dressing changed 05/31/2012 11:30 PM  Site Assessment Clean;Dry;Intact 05/31/2012 11:30 PM  Surrounding Skin Intact 05/31/2012 11:30 PM  Output (mL) 90 mL 06/01/2012  6:30 AM     NG/OG Tube Orogastric 16 Fr. Center mouth (Active)    Placement Verification Auscultation 06/01/2012  8:00 AM  Site Assessment Clean;Dry;Intact 06/01/2012  8:00 AM  Status Suction-low intermittent 06/01/2012  8:00 AM  Drainage Appearance Bloody 06/01/2012  8:00 AM     Urethral Catheter Temperature probe (Active)  Indication for Insertion or Continuance of Catheter Physician order;Urinary output monitoring 05/31/2012  9:00 PM  Site Assessment Clean;Intact 05/31/2012  9:00 PM  Collection Container Standard drainage bag 05/31/2012  9:00 PM  Securement Method Leg strap 05/31/2012  9:00 PM  Urinary Catheter Interventions Unclamped 05/31/2012  9:00 PM    Microbiology/Sepsis markers: Results for orders placed during the hospital encounter of 05/31/12  MRSA PCR SCREENING     Status: Abnormal   Collection Time   05/31/12  8:58 PM      Component Value Range Status Comment   MRSA by PCR INVALID RESULTS, SPECIMEN SENT FOR CULTURE (*) NEGATIVE Final     Anti-infectives:  Anti-infectives    None       Consults: Treatment Team:  Hewitt Shorts, MD Melvenia Beam, MD    Events:  Subjective:    Overnight Issues: Remains on vent, on multiple pressors  Objective:  Vital signs for last 24 hours: Temp:  [95.7 F (35.4 C)-100 F (37.8 C)] 100 F (37.8 C) (08/31 0900) Pulse Rate:  [58-148] 89  (08/31 0900) Resp:  [10-27] 18  (08/31 0900) BP: (43-256)/(15-212) 104/45 mmHg (08/31 0900) SpO2:  [  72 %-100 %] 100 % (08/31 0900) Arterial Line BP: (79-129)/(59-87) 123/83 mmHg (08/31 0400) FiO2 (%):  [50 %-100 %] 51.1 % (08/31 0900) Weight:  [220 lb 7.4 oz (100 kg)-253 lb 1.4 oz (114.8 kg)] 253 lb 1.4 oz (114.8 kg) (08/30 2030)  Hemodynamic parameters for last 24 hours:    Intake/Output from previous day: 08/30 0701 - 08/31 0700 In: 8413 [I.V.:8389] Out: 455 [Urine:265; Chest Tube:190]  Intake/Output this shift: Total I/O In: 240 [I.V.:200; Other:40] Out: -   Vent settings for last 24 hours: Vent Mode:  [-] PRVC FiO2 (%):  [50 %-100 %]  51.1 % Set Rate:  [16 bmp-18 bmp] 18 bmp Vt Set:  [520 mL] 520 mL PEEP:  [5 cmH20-8 cmH20] 8 cmH20 Plateau Pressure:  [12 cmH20-19 cmH20] 16 cmH20  Physical Exam:  Sedated, on Vent Lungs with decreased breath sounds bilaterally, right chest tube with air leak  CV tachy Abdomen soft, nondistended Ext stable  Results for orders placed during the hospital encounter of 05/31/12 (from the past 24 hour(s))  TYPE AND SCREEN     Status: Normal (Preliminary result)   Collection Time   05/31/12  3:30 PM      Component Value Range   ABO/RH(D) O POS     Antibody Screen NEG     Sample Expiration 06/03/2012     Unit Number [id number]     Blood Component Type RED CELLS,LR     Unit division 00     Status of Unit ISSUED     Unit tag comment VERBAL ORDERS PER DR NANAVATI     Transfusion Status OK TO TRANSFUSE     Crossmatch Result COMPATIBLE     Unit Number [id number]     Blood Component Type RED CELLS,LR     Unit division 00     Status of Unit ISSUED     Unit tag comment VERBAL ORDERS PER DR NANAVATI     Transfusion Status OK TO TRANSFUSE     Crossmatch Result COMPATIBLE     Unit Number [id number]     Blood Component Type RED CELLS,LR     Unit division 00     Status of Unit ISSUED     Unit tag comment VERBAL ORDERS PER DR BEATON     Transfusion Status OK TO TRANSFUSE     Crossmatch Result COMPATIBLE     Unit Number [id number]     Blood Component Type RED CELLS,LR     Unit division 00     Status of Unit ISSUED     Unit tag comment VERBAL ORDERS PER DR BEATON     Transfusion Status OK TO TRANSFUSE     Crossmatch Result COMPATIBLE    ABO/RH     Status: Normal   Collection Time   05/31/12  3:55 PM      Component Value Range   ABO/RH(D) O POS    POCT I-STAT, CHEM 8     Status: Abnormal   Collection Time   05/31/12  4:06 PM      Component Value Range   Sodium 136  135 - 145 mEq/L   Potassium 4.7  3.5 - 5.1 mEq/L   Chloride 100  96 - 112 mEq/L   BUN 28 (*) 6 - 23 mg/dL     Creatinine, Ser 9.32  0.50 - 1.35 mg/dL   Glucose, Bld 355 (*) 70 - 99 mg/dL   Calcium, Ion 7.32  2.02 - 1.30 mmol/L   TCO2  25  0 - 100 mmol/L   Hemoglobin 14.3  13.0 - 17.0 g/dL   HCT 16.1  09.6 - 04.5 %  PREPARE FRESH FROZEN PLASMA     Status: Normal (Preliminary result)   Collection Time   05/31/12  4:37 PM      Component Value Range   Unit Number [id number]     Blood Component Type THAWED PLASMA     Unit division 00     Status of Unit ISSUED     Transfusion Status OK TO TRANSFUSE     Unit Number [id number]     Blood Component Type THAWED PLASMA     Unit division 00     Status of Unit ISSUED     Transfusion Status OK TO TRANSFUSE    CDS SEROLOGY     Status: Normal   Collection Time   05/31/12  4:45 PM      Component Value Range   CDS serology specimen       Value: SPECIMEN WILL BE HELD FOR 14 DAYS IF TESTING IS REQUIRED  COMPREHENSIVE METABOLIC PANEL     Status: Abnormal   Collection Time   05/31/12  4:45 PM      Component Value Range   Sodium 133 (*) 135 - 145 mEq/L   Potassium 4.6  3.5 - 5.1 mEq/L   Chloride 95 (*) 96 - 112 mEq/L   CO2 25  19 - 32 mEq/L   Glucose, Bld 211 (*) 70 - 99 mg/dL   BUN 27 (*) 6 - 23 mg/dL   Creatinine, Ser 9.62  0.50 - 1.35 mg/dL   Calcium 8.7  8.4 - 95.2 mg/dL   Total Protein 6.5  6.0 - 8.3 g/dL   Albumin 3.9  3.5 - 5.2 g/dL   AST 30  0 - 37 U/L   ALT 23  0 - 53 U/L   Alkaline Phosphatase 78  39 - 117 U/L   Total Bilirubin 0.4  0.3 - 1.2 mg/dL   GFR calc non Af Amer 54 (*) >90 mL/min   GFR calc Af Amer 63 (*) >90 mL/min  CBC     Status: Abnormal   Collection Time   05/31/12  4:45 PM      Component Value Range   WBC 13.8 (*) 4.0 - 10.5 K/uL   RBC 4.36  4.22 - 5.81 MIL/uL   Hemoglobin 13.0  13.0 - 17.0 g/dL   HCT 84.1  32.4 - 40.1 %   MCV 91.7  78.0 - 100.0 fL   MCH 29.8  26.0 - 34.0 pg   MCHC 32.5  30.0 - 36.0 g/dL   RDW 02.7  25.3 - 66.4 %   Platelets 223  150 - 400 K/uL  LACTIC ACID, PLASMA     Status: Abnormal    Collection Time   05/31/12  4:45 PM      Component Value Range   Lactic Acid, Venous 3.4 (*) 0.5 - 2.2 mmol/L  PROTIME-INR     Status: Normal   Collection Time   05/31/12  4:45 PM      Component Value Range   Prothrombin Time 14.8  11.6 - 15.2 seconds   INR 1.14  0.00 - 1.49  HEMOGLOBIN A1C     Status: Abnormal   Collection Time   05/31/12  7:14 PM      Component Value Range   Hemoglobin A1C 5.9 (*) <5.7 %   Mean Plasma Glucose 123 (*) <117 mg/dL  POCT I-STAT, CHEM 8     Status: Abnormal   Collection Time   05/31/12  7:44 PM      Component Value Range   Sodium 135  135 - 145 mEq/L   Potassium 4.2  3.5 - 5.1 mEq/L   Chloride 98  96 - 112 mEq/L   BUN 29 (*) 6 - 23 mg/dL   Creatinine, Ser 4.54 (*) 0.50 - 1.35 mg/dL   Glucose, Bld 098 (*) 70 - 99 mg/dL   Calcium, Ion 1.19 (*) 1.13 - 1.30 mmol/L   TCO2 23  0 - 100 mmol/L   Hemoglobin 11.6 (*) 13.0 - 17.0 g/dL   HCT 14.7 (*) 82.9 - 56.2 %  MRSA PCR SCREENING     Status: Abnormal   Collection Time   05/31/12  8:58 PM      Component Value Range   MRSA by PCR INVALID RESULTS, SPECIMEN SENT FOR CULTURE (*) NEGATIVE  GLUCOSE, CAPILLARY     Status: Abnormal   Collection Time   05/31/12  9:40 PM      Component Value Range   Glucose-Capillary 340 (*) 70 - 99 mg/dL  BLOOD GAS, ARTERIAL     Status: Abnormal   Collection Time   05/31/12  9:50 PM      Component Value Range   FIO2 100.00     Delivery systems VENTILATOR     Mode PRESSURE REGULATED VOLUME CONTROL     VT 520     Rate 18     Peep/cpap 5.0     pH, Arterial 7.227 (*) 7.350 - 7.450   pCO2 arterial 47.8 (*) 35.0 - 45.0 mmHg   pO2, Arterial 204.0 (*) 80.0 - 100.0 mmHg   Bicarbonate 19.2 (*) 20.0 - 24.0 mEq/L   TCO2 20.6  0 - 100 mmol/L   Acid-base deficit 7.1 (*) 0.0 - 2.0 mmol/L   O2 Saturation 99.5     Patient temperature 98.6     Collection site RIGHT RADIAL     Drawn by COLLECTED BY RT     Sample type ARTERIAL DRAW     Allens test (pass/fail) PASS  PASS  GLUCOSE,  CAPILLARY     Status: Abnormal   Collection Time   06/01/12 12:07 AM      Component Value Range   Glucose-Capillary 335 (*) 70 - 99 mg/dL  URINALYSIS, WITH MICROSCOPIC     Status: Abnormal   Collection Time   06/01/12  1:45 AM      Component Value Range   Color, Urine AMBER (*) YELLOW   APPearance CLOUDY (*) CLEAR   Specific Gravity, Urine 1.025  1.005 - 1.030   pH 5.0  5.0 - 8.0   Glucose, UA 100 (*) NEGATIVE mg/dL   Hgb urine dipstick TRACE (*) NEGATIVE   Bilirubin Urine NEGATIVE  NEGATIVE   Ketones, ur NEGATIVE  NEGATIVE mg/dL   Protein, ur 30 (*) NEGATIVE mg/dL   Urobilinogen, UA 1.0  0.0 - 1.0 mg/dL   Nitrite NEGATIVE  NEGATIVE   Leukocytes, UA NEGATIVE  NEGATIVE   WBC, UA 3-6  <3 WBC/hpf   RBC / HPF 3-6  <3 RBC/hpf   Bacteria, UA FEW (*) RARE   Squamous Epithelial / LPF FEW (*) RARE   Casts HYALINE CASTS (*) NEGATIVE  GLUCOSE, CAPILLARY     Status: Abnormal   Collection Time   06/01/12  4:31 AM      Component Value Range   Glucose-Capillary 439 (*) 70 -  99 mg/dL  CBC     Status: Abnormal   Collection Time   06/01/12  4:45 AM      Component Value Range   WBC 18.3 (*) 4.0 - 10.5 K/uL   RBC 3.10 (*) 4.22 - 5.81 MIL/uL   Hemoglobin 9.4 (*) 13.0 - 17.0 g/dL   HCT 16.1 (*) 09.6 - 04.5 %   MCV 88.4  78.0 - 100.0 fL   MCH 30.3  26.0 - 34.0 pg   MCHC 34.3  30.0 - 36.0 g/dL   RDW 40.9  81.1 - 91.4 %   Platelets 151  150 - 400 K/uL  BASIC METABOLIC PANEL     Status: Abnormal   Collection Time   06/01/12  4:45 AM      Component Value Range   Sodium 131 (*) 135 - 145 mEq/L   Potassium 4.9  3.5 - 5.1 mEq/L   Chloride 98  96 - 112 mEq/L   CO2 22  19 - 32 mEq/L   Glucose, Bld 429 (*) 70 - 99 mg/dL   BUN 31 (*) 6 - 23 mg/dL   Creatinine, Ser 7.82 (*) 0.50 - 1.35 mg/dL   Calcium 7.1 (*) 8.4 - 10.5 mg/dL   GFR calc non Af Amer 33 (*) >90 mL/min   GFR calc Af Amer 38 (*) >90 mL/min  PROTIME-INR     Status: Abnormal   Collection Time   06/01/12  4:45 AM      Component Value  Range   Prothrombin Time 17.2 (*) 11.6 - 15.2 seconds   INR 1.38  0.00 - 1.49  APTT     Status: Normal   Collection Time   06/01/12  4:45 AM      Component Value Range   aPTT 35  24 - 37 seconds  GLUCOSE, CAPILLARY     Status: Abnormal   Collection Time   06/01/12  6:16 AM      Component Value Range   Glucose-Capillary 365 (*) 70 - 99 mg/dL     Assessment/Plan:   NEURO  CT pending.  Neurosur following     PULM  Remains of vent.  Right chest tube with last hole out of chest.  Has been that way since putting it in.  No ptx seen.  Will leave in place     CARDIO       RENAL  Creatinine up.  maintining UOP                             Global Issues      LOS: 1 day   Continue supportive care  Critical Care Total Time*: 15 Minutes  Hadia Minier A 06/01/2012  *Care during the described time interval was provided by me and/or other providers on the critical care team.  I have reviewed this patient's available data, including medical history, events of note, physical examination and test results as part of my evaluation.

## 2012-06-01 NOTE — Progress Notes (Signed)
eLink Physician-Brief Progress Note Patient Name: Myshawn Chiriboga DOB: 10-24-1945 MRN: [id number]  Date of Service  06/01/2012   HPI/Events of Note   Hyperglycemia Trauma/RN requests help with insulin gtt  eICU Interventions  Part 2 hyperglycemia protocol written      Sheryl Saintil 06/01/2012, 5:03 AM

## 2012-06-01 NOTE — Progress Notes (Signed)
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 intraorbital blood is present in both superior orbits.  The globes are intact.  The mandible is intact.  No pterygoid plate fractures identified.  IMPRESSION: Multiple facial fractures as above.  This includes right frontal fracture extending through the superior orbit, multiple bilateral maxillary fractures, right zygoma fracture with mild depression, right nasal fracture, bilateral lateral orbital fractures and left temporal fracture.  CT CERVICAL SPINE  Findings:   The cervical spine shows multilevel spondylosis, particularly at the C4-5, C5-6 and C6-7 levels.  There is no evidence of cervical fracture or subluxation.  Posterior pharyngeal soft tissue swelling extends into the prevertebral region.  IMPRESSION: No evidence of acute cervical fracture or subluxation.   Original Report Authenticated By: Reola Calkins, M.D.    Ct Portable Head W/o Cm  06/01/2012  *RADIOLOGY REPORT*  Clinical Data: Follow-up intracranial hemorrhage following significant facial and head trauma.  CT HEAD WITHOUT CONTRAST  Technique:  Contiguous axial images were obtained from the base of the skull through the vertex without contrast.  Comparison: Yesterday.  Findings: The previously demonstrated 2.5 cm peripheral right frontal lobe hemorrhage is slightly different in shape  without significant change in overall size, continuing to measure 2.5 cm in maximum diameter.  The previously demonstrated 1.2 cm area of parenchymal hemorrhage more superiorly in the right frontal lobe has not changed significantly, measuring 1.1 cm in maximum diameter.  Interval visualization of a 6 mm area of parenchymal hemorrhage more superiorly in the right frontal lobe.  There is also interval visualization of a 6 mm linear area of hemorrhage along the posterior aspect of a sulcus in the high right parietal lobe, possibly within the cerebral cortex.  Interval 6 mm area of hemorrhage in the right occipital lobe and 4 mm area of probable hemorrhage in the right occipital lobe.  Interval small amount of subarachnoid hemorrhage bilaterally.  Right extracranial and facial soft tissue swelling is again demonstrated along with visualization of some of the previously described facial fractures.  There is associated blood and mucosal thickening in the sinuses.  The there is no midline shift or evidence of transtentorial herniation.  The ventricles are normal in size and position.  IMPRESSION: Interval small amount of subarachnoid hemorrhage and small new areas of parenchymal hemorrhage, as described above.   Original Report Authenticated By: Darrol Angel, M.D.     Assessment/Plan: CT repeated this morning. It shows minimal evolution of the right frontal hemorrhagic cerebral contusion. We also now see some minimal intraventricular hemorrhage, evidence by small amount of hyperdensity in the posterior most aspect of the occipital horns bilaterally. There is also small amount of subarachnoid hemorrhage within the central sulci bilaterally.  Patient is neurologically stable, to slightly improved (now following some simple commands). However his hemiparesis persists. Further his CT does show some interval evolution. The CT will need to be repeated in 2-3 days.   Hewitt Shorts, MD 06/01/2012, 2:17 PM

## 2012-06-01 NOTE — Progress Notes (Signed)
Patient briefly opened eyes on stimulation of oral care.  Patient not following commands.  Bolus of of Fentanyl and of Propofoll given at this time. Vital signs stable.  Will continue to keep patient comfortable and continue to monitor.

## 2012-06-01 NOTE — Progress Notes (Signed)
Patient's CBG has been increasing throughout the night.  Last CBG of 439.  M.D. called, patient to be placed on insulin gtt.  Continuing to monitor patient.
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
## 2012-06-02 ENCOUNTER — Inpatient Hospital Stay (HOSPITAL_COMMUNITY): Payer: Medicaid Other

## 2012-06-02 DIAGNOSIS — S0280XA Fracture of other specified skull and facial bones, unspecified side, initial encounter for closed fracture: Secondary | ICD-10-CM

## 2012-06-02 DIAGNOSIS — J9600 Acute respiratory failure, unspecified whether with hypoxia or hypercapnia: Secondary | ICD-10-CM

## 2012-06-02 DIAGNOSIS — R570 Cardiogenic shock: Secondary | ICD-10-CM

## 2012-06-02 DIAGNOSIS — J189 Pneumonia, unspecified organism: Secondary | ICD-10-CM

## 2012-06-02 DIAGNOSIS — J209 Acute bronchitis, unspecified: Secondary | ICD-10-CM | POA: Diagnosis not present

## 2012-06-02 DIAGNOSIS — I469 Cardiac arrest, cause unspecified: Secondary | ICD-10-CM

## 2012-06-02 DIAGNOSIS — J9601 Acute respiratory failure with hypoxia: Secondary | ICD-10-CM

## 2012-06-02 HISTORY — DX: Pneumonia, unspecified organism: J18.9

## 2012-06-02 HISTORY — DX: Acute respiratory failure with hypoxia: J96.01

## 2012-06-02 HISTORY — DX: Cardiac arrest, cause unspecified: I46.9

## 2012-06-02 HISTORY — DX: Acute bronchitis, unspecified: J20.9

## 2012-06-02 LAB — BLOOD GAS, ARTERIAL
Bicarbonate: 20.6 mEq/L (ref 20.0–24.0)
Drawn by: 270211
PEEP: 10 cmH2O
Patient temperature: 98.6
RATE: 18 resp/min
pH, Arterial: 7.289 — ABNORMAL LOW (ref 7.350–7.450)
pO2, Arterial: 62.8 mmHg — ABNORMAL LOW (ref 80.0–100.0)

## 2012-06-02 LAB — CBC
HCT: 25.4 % — ABNORMAL LOW (ref 39.0–52.0)
MCHC: 34.6 g/dL (ref 30.0–36.0)
MCV: 87.3 fL (ref 78.0–100.0)
RDW: 13.7 % (ref 11.5–15.5)

## 2012-06-02 LAB — GLUCOSE, CAPILLARY
Glucose-Capillary: 143 mg/dL — ABNORMAL HIGH (ref 70–99)
Glucose-Capillary: 168 mg/dL — ABNORMAL HIGH (ref 70–99)
Glucose-Capillary: 176 mg/dL — ABNORMAL HIGH (ref 70–99)
Glucose-Capillary: 203 mg/dL — ABNORMAL HIGH (ref 70–99)

## 2012-06-02 LAB — BASIC METABOLIC PANEL
BUN: 35 mg/dL — ABNORMAL HIGH (ref 6–23)
Calcium: 7.6 mg/dL — ABNORMAL LOW (ref 8.4–10.5)
Creatinine, Ser: 1.84 mg/dL — ABNORMAL HIGH (ref 0.50–1.35)
GFR calc Af Amer: 43 mL/min — ABNORMAL LOW (ref >90)
GFR calc non Af Amer: 37 mL/min — ABNORMAL LOW (ref >90)

## 2012-06-02 LAB — CORTISOL: Cortisol, Plasma: 43.9 ug/dL

## 2012-06-02 MED ORDER — VANCOMYCIN HCL 1000 MG IV SOLR
1500.0000 mg | INTRAVENOUS | Status: DC
  Administered 2012-06-03: 1500 mg via INTRAVENOUS
  Filled 2012-06-02 (×2): qty 1500

## 2012-06-02 MED ORDER — SODIUM CHLORIDE 0.9 % IV SOLN
INTRAVENOUS | Status: DC
  Administered 2012-06-02: 18:00:00 via INTRAVENOUS
  Administered 2012-06-02: 20 mL/h via INTRAVENOUS
  Administered 2012-06-03: 09:00:00 via INTRAVENOUS
  Administered 2012-06-04: 1000 mL via INTRAVENOUS
  Administered 2012-06-05: 03:00:00 via INTRAVENOUS

## 2012-06-02 MED ORDER — INSULIN GLARGINE 100 UNIT/ML ~~LOC~~ SOLN
33.0000 [IU] | SUBCUTANEOUS | Status: AC
  Administered 2012-06-02: 33 [IU] via SUBCUTANEOUS

## 2012-06-02 MED ORDER — SODIUM CHLORIDE 0.9 % IV SOLN
1.0000 mg/h | INTRAVENOUS | Status: DC
  Administered 2012-06-02 – 2012-06-04 (×5): 2 mg/h via INTRAVENOUS
  Administered 2012-06-05 (×2): 5 mg/h via INTRAVENOUS
  Administered 2012-06-06 – 2012-06-09 (×6): 3 mg/h via INTRAVENOUS
  Administered 2012-06-10: 5 mg/h via INTRAVENOUS
  Administered 2012-06-10: 4 mg/h via INTRAVENOUS
  Administered 2012-06-11: 3 mg/h via INTRAVENOUS
  Administered 2012-06-12: 4 mg/h via INTRAVENOUS
  Administered 2012-06-13 – 2012-06-18 (×2): 2 mg/h via INTRAVENOUS
  Administered 2012-06-19: 4 mg/h via INTRAVENOUS
  Administered 2012-06-19 – 2012-06-20 (×2): 3 mg/h via INTRAVENOUS
  Administered 2012-06-21 – 2012-06-22 (×2): 2 mg/h via INTRAVENOUS
  Administered 2012-06-25: 1 mg/h via INTRAVENOUS
  Filled 2012-06-02 (×29): qty 10

## 2012-06-02 MED ORDER — MIDAZOLAM HCL 2 MG/2ML IJ SOLN
2.0000 mg | INTRAMUSCULAR | Status: DC | PRN
  Administered 2012-06-02: 4 mg via INTRAVENOUS
  Administered 2012-06-17 – 2012-06-18 (×5): 2 mg via INTRAVENOUS
  Administered 2012-06-19: 4 mg via INTRAVENOUS
  Administered 2012-06-25 – 2012-06-28 (×5): 2 mg via INTRAVENOUS
  Administered 2012-06-28: 4 mg via INTRAVENOUS
  Administered 2012-06-29 – 2012-07-02 (×4): 2 mg via INTRAVENOUS
  Administered 2012-07-03: 5 mg via INTRAVENOUS
  Administered 2012-07-03 – 2012-07-06 (×5): 2 mg via INTRAVENOUS
  Filled 2012-06-02: qty 2
  Filled 2012-06-02: qty 4
  Filled 2012-06-02 (×2): qty 2
  Filled 2012-06-02: qty 4
  Filled 2012-06-02 (×2): qty 2
  Filled 2012-06-02: qty 6
  Filled 2012-06-02 (×8): qty 2
  Filled 2012-06-02: qty 4
  Filled 2012-06-02 (×3): qty 2
  Filled 2012-06-02: qty 4
  Filled 2012-06-02: qty 2

## 2012-06-02 MED ORDER — INSULIN ASPART 100 UNIT/ML ~~LOC~~ SOLN
0.0000 [IU] | SUBCUTANEOUS | Status: DC
  Administered 2012-06-02 (×2): 4 [IU] via SUBCUTANEOUS

## 2012-06-02 MED ORDER — DEXTROSE 5 % IV SOLN
1.0000 g | Freq: Three times a day (TID) | INTRAVENOUS | Status: AC
  Administered 2012-06-02 – 2012-06-12 (×30): 1 g via INTRAVENOUS
  Filled 2012-06-02 (×30): qty 1

## 2012-06-02 MED ORDER — HYDROCORTISONE SOD SUCCINATE 100 MG IJ SOLR
50.0000 mg | Freq: Four times a day (QID) | INTRAMUSCULAR | Status: DC
  Administered 2012-06-02 – 2012-06-03 (×3): 50 mg via INTRAVENOUS
  Filled 2012-06-02 (×8): qty 1

## 2012-06-02 MED ORDER — VASOPRESSIN 20 UNIT/ML IJ SOLN
0.0300 [IU]/min | INTRAVENOUS | Status: DC
  Administered 2012-06-02 – 2012-06-03 (×2): 0.03 [IU]/min via INTRAVENOUS
  Filled 2012-06-02 (×2): qty 2.5

## 2012-06-02 MED ORDER — VANCOMYCIN HCL 1000 MG IV SOLR
2000.0000 mg | Freq: Once | INTRAVENOUS | Status: AC
  Administered 2012-06-02: 2000 mg via INTRAVENOUS
  Filled 2012-06-02: qty 2000

## 2012-06-02 MED ORDER — MIDAZOLAM HCL 2 MG/2ML IJ SOLN: 2.0000 mg | Freq: Once | INTRAMUSCULAR | Status: DC

## 2012-06-02 MED ORDER — DEXTROSE 10 % IV SOLN: INTRAVENOUS | Status: DC | PRN

## 2012-06-02 MED ORDER — INSULIN GLARGINE 100 UNIT/ML ~~LOC~~ SOLN: 33.0000 [IU] | SUBCUTANEOUS | Status: DC

## 2012-06-02 NOTE — Procedures (Signed)
Bronchoscopy Procedure Note Alex Foster [id number] [birth year]  Procedure: Bronchoscopy Indications: Diagnostic evaluation of the airways, Obtain specimens for culture and/or other diagnostic studies and Remove secretions  Procedure Details Consent: Unable to obtain consent because of emergent medical necessity. Time Out: Verified patient identification, verified procedure, site/side was marked, verified correct patient position, special equipment/implants available, medications/allergies/relevent history reviewed, required imaging and test results available.  Performed  In preparation for procedure, patient was given 100% FiO2 and bronchoscope lubricated. Sedation: Etomidate  Airway entered and the following bronchi were examined: RUL, RML, RLL, LUL, LLL and Bronchi.   Procedures performed: Brushings performed Bronchoscope removed.  , Patient placed back on 100% FiO2 at conclusion of procedure.    Evaluation Hemodynamic Status: Persistent hypotension treated with pressors; O2 sats: currently acceptable Patient's Current Condition: stable Specimens:  Sent purulent fluid, bloody  Complications: No apparent complications Patient did tolerate procedure well.   Alex Foster. 06/02/2012   1. Impressive full rt mainstem obstruction thick dark old clot, pus, throughout all lobes rt , BI, RML, RLL 2. LLL obstruction mild 3. No mass lesions 4. BAL RML, dark bloody , clot, pus  sats improved after to 97%  [name]. [name], MD, FACP Pgr: [phone] [facility]
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
## 2012-06-02 NOTE — Progress Notes (Addendum)
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 superior orbital fracture present.  Nondisplaced right nasal fracture extends into the nasoethmoid region.  There are fractures extending into the lamina the pressure on the right.  Lateral orbital wall fractures are present bilaterally.  Minimally displaced zygoma fracture on the right.  Multiple anterior and lateral maxillary fractures are present at the level of both maxillary antra.  Associated blood in both maxillary sinuses.  No evidence of orbital blowout fracture.  Nasal airway present on the left.  Nondisplaced fracture involving the lateral aspect of the left temporal bone.  Small amount of intraorbital blood is present in both superior orbits.  The globes are intact.  The mandible is intact.  No pterygoid plate fractures identified.  IMPRESSION: Multiple facial fractures as above.  This includes right frontal fracture extending through the superior orbit, multiple bilateral maxillary fractures, right zygoma fracture with mild depression, right nasal fracture, bilateral lateral orbital fractures and left temporal fracture.  CT CERVICAL SPINE  Findings:   The cervical spine shows multilevel spondylosis, particularly at the C4-5, C5-6 and C6-7 levels.  There is no evidence of cervical fracture or subluxation.  Posterior pharyngeal soft tissue  swelling extends into the prevertebral region.  IMPRESSION: No evidence of acute cervical fracture or subluxation.   Original Report Authenticated By: Reola Calkins, M.D.    Ct Portable Head W/o Cm  06/01/2012  *RADIOLOGY REPORT*  Clinical Data: Follow-up intracranial hemorrhage following significant facial and head trauma.  CT HEAD WITHOUT CONTRAST  Technique:  Contiguous axial images were obtained from the base of the skull through the vertex without contrast.  Comparison: Yesterday.  Findings: The previously demonstrated 2.5 cm peripheral right frontal lobe hemorrhage is slightly different in shape without significant change in overall size, continuing to measure 2.5 cm in maximum diameter.  The previously demonstrated 1.2 cm area of parenchymal hemorrhage more superiorly in the right frontal lobe has not changed significantly, measuring 1.1 cm in maximum diameter.  Interval visualization of a 6 mm area of parenchymal hemorrhage more superiorly in the right frontal lobe.  There is also interval visualization of a 6 mm linear area of hemorrhage along the posterior aspect of a sulcus in the high right parietal lobe, possibly within the cerebral cortex.  Interval 6 mm area of hemorrhage in the right occipital lobe and 4 mm area of probable hemorrhage in the right occipital lobe.  Interval small amount of subarachnoid hemorrhage bilaterally.  Right extracranial and facial soft tissue swelling is again demonstrated along with visualization of some of the previously described facial fractures.  There is associated blood and mucosal thickening in the sinuses.  The there is no midline shift or evidence of transtentorial herniation.  The ventricles are normal in size and position.  IMPRESSION: Interval small amount of subarachnoid hemorrhage and small new areas of parenchymal hemorrhage, as described above.   Original Report Authenticated By: Darrol Angel, M.D.     Assessment/Plan: Increasing FiO2 required on  ventilator.  Continue support.  No news recommendations from neurosurgical standpoint.    LOS: 2 days    Ted Goodner D, MD 06/02/2012, 10:22 AM  Head CT stable with small amount of occipital horn hemorrhage/SAH.  Chest Xray stable, will defer to Trauma, but may need Chest CT/angio to r/o PE.

## 2012-06-03 ENCOUNTER — Inpatient Hospital Stay (HOSPITAL_COMMUNITY): Payer: Medicaid Other

## 2012-06-03 DIAGNOSIS — J189 Pneumonia, unspecified organism: Secondary | ICD-10-CM

## 2012-06-03 DIAGNOSIS — IMO0002 Reserved for concepts with insufficient information to code with codable children: Secondary | ICD-10-CM

## 2012-06-03 DIAGNOSIS — R609 Edema, unspecified: Secondary | ICD-10-CM

## 2012-06-03 LAB — GLUCOSE, CAPILLARY
Glucose-Capillary: 114 mg/dL — ABNORMAL HIGH (ref 70–99)
Glucose-Capillary: 120 mg/dL — ABNORMAL HIGH (ref 70–99)
Glucose-Capillary: 129 mg/dL — ABNORMAL HIGH (ref 70–99)
Glucose-Capillary: 135 mg/dL — ABNORMAL HIGH (ref 70–99)
Glucose-Capillary: 136 mg/dL — ABNORMAL HIGH (ref 70–99)
Glucose-Capillary: 149 mg/dL — ABNORMAL HIGH (ref 70–99)
Glucose-Capillary: 150 mg/dL — ABNORMAL HIGH (ref 70–99)
Glucose-Capillary: 168 mg/dL — ABNORMAL HIGH (ref 70–99)
Glucose-Capillary: 169 mg/dL — ABNORMAL HIGH (ref 70–99)
Glucose-Capillary: 176 mg/dL — ABNORMAL HIGH (ref 70–99)
Glucose-Capillary: 177 mg/dL — ABNORMAL HIGH (ref 70–99)
Glucose-Capillary: 201 mg/dL — ABNORMAL HIGH (ref 70–99)
Glucose-Capillary: 237 mg/dL — ABNORMAL HIGH (ref 70–99)
Glucose-Capillary: 279 mg/dL — ABNORMAL HIGH (ref 70–99)
Glucose-Capillary: 318 mg/dL — ABNORMAL HIGH (ref 70–99)

## 2012-06-03 LAB — CBC
Hemoglobin: 7.2 g/dL — ABNORMAL LOW (ref 13.0–17.0)
MCH: 30.6 pg (ref 26.0–34.0)
MCV: 86.8 fL (ref 78.0–100.0)
Platelets: 101 10*3/uL — ABNORMAL LOW (ref 150–400)
RBC: 2.35 MIL/uL — ABNORMAL LOW (ref 4.22–5.81)

## 2012-06-03 LAB — COMPREHENSIVE METABOLIC PANEL
CO2: 25 mEq/L (ref 19–32)
Calcium: 7.8 mg/dL — ABNORMAL LOW (ref 8.4–10.5)
Creatinine, Ser: 1.37 mg/dL — ABNORMAL HIGH (ref 0.50–1.35)
GFR calc Af Amer: 61 mL/min — ABNORMAL LOW (ref >90)
GFR calc non Af Amer: 53 mL/min — ABNORMAL LOW (ref >90)
Glucose, Bld: 208 mg/dL — ABNORMAL HIGH (ref 70–99)

## 2012-06-03 LAB — MRSA CULTURE

## 2012-06-03 LAB — BLOOD GAS, ARTERIAL
Acid-base deficit: 3.1 mmol/L — ABNORMAL HIGH (ref 0.0–2.0)
Bicarbonate: 22.7 mEq/L (ref 20.0–24.0)
O2 Saturation: 98.9 %
Patient temperature: 99
TCO2: 24.2 mmol/L (ref 0–100)

## 2012-06-03 LAB — TRIGLYCERIDES: Triglycerides: 75 mg/dL (ref <150)

## 2012-06-03 MED ORDER — INSULIN ASPART 100 UNIT/ML ~~LOC~~ SOLN: 0.0000 [IU] | SUBCUTANEOUS | Status: DC

## 2012-06-03 MED ORDER — INSULIN GLARGINE 100 UNIT/ML ~~LOC~~ SOLN
20.0000 [IU] | Freq: Every day | SUBCUTANEOUS | Status: DC
  Administered 2012-06-03 – 2012-06-07 (×5): 20 [IU] via SUBCUTANEOUS

## 2012-06-03 MED ORDER — INSULIN ASPART 100 UNIT/ML ~~LOC~~ SOLN
0.0000 [IU] | SUBCUTANEOUS | Status: DC
  Administered 2012-06-03: 2 [IU] via SUBCUTANEOUS
  Administered 2012-06-03: 3 [IU] via SUBCUTANEOUS
  Administered 2012-06-04 – 2012-06-05 (×3): 2 [IU] via SUBCUTANEOUS
  Administered 2012-06-05: 3 [IU] via SUBCUTANEOUS
  Administered 2012-06-05: 2 [IU] via SUBCUTANEOUS
  Administered 2012-06-05 (×2): 3 [IU] via SUBCUTANEOUS
  Administered 2012-06-06: 2 [IU] via SUBCUTANEOUS
  Administered 2012-06-06 (×2): 3 [IU] via SUBCUTANEOUS
  Administered 2012-06-06: 2 [IU] via SUBCUTANEOUS
  Administered 2012-06-06: 3 [IU] via SUBCUTANEOUS
  Administered 2012-06-07: 2 [IU] via SUBCUTANEOUS
  Administered 2012-06-07 (×3): 3 [IU] via SUBCUTANEOUS
  Administered 2012-06-07 – 2012-06-08 (×3): 2 [IU] via SUBCUTANEOUS
  Administered 2012-06-08 (×2): 3 [IU] via SUBCUTANEOUS
  Administered 2012-06-08 (×2): 2 [IU] via SUBCUTANEOUS
  Administered 2012-06-08 – 2012-06-09 (×3): 3 [IU] via SUBCUTANEOUS
  Administered 2012-06-09: 2 [IU] via SUBCUTANEOUS
  Administered 2012-06-09: 3 [IU] via SUBCUTANEOUS
  Administered 2012-06-09: 2 [IU] via SUBCUTANEOUS
  Administered 2012-06-09: 3 [IU] via SUBCUTANEOUS
  Administered 2012-06-10 (×4): 2 [IU] via SUBCUTANEOUS
  Administered 2012-06-10 – 2012-06-11 (×3): 3 [IU] via SUBCUTANEOUS
  Administered 2012-06-11: 2 [IU] via SUBCUTANEOUS
  Administered 2012-06-11: 3 [IU] via SUBCUTANEOUS
  Administered 2012-06-12: 5 [IU] via SUBCUTANEOUS
  Administered 2012-06-12: 3 [IU] via SUBCUTANEOUS
  Administered 2012-06-12: 2 [IU] via SUBCUTANEOUS
  Administered 2012-06-12: 3 [IU] via SUBCUTANEOUS
  Administered 2012-06-12: 2 [IU] via SUBCUTANEOUS
  Administered 2012-06-12 – 2012-06-14 (×9): 3 [IU] via SUBCUTANEOUS
  Administered 2012-06-14: 5 [IU] via SUBCUTANEOUS
  Administered 2012-06-14 (×2): 3 [IU] via SUBCUTANEOUS
  Administered 2012-06-14: 2 [IU] via SUBCUTANEOUS
  Administered 2012-06-15 (×2): 3 [IU] via SUBCUTANEOUS
  Administered 2012-06-15 (×2): 2 [IU] via SUBCUTANEOUS
  Administered 2012-06-15 – 2012-06-16 (×7): 3 [IU] via SUBCUTANEOUS
  Administered 2012-06-17: 2 [IU] via SUBCUTANEOUS
  Administered 2012-06-17: 3 [IU] via SUBCUTANEOUS
  Administered 2012-06-17: 5 [IU] via SUBCUTANEOUS
  Administered 2012-06-17 (×2): 3 [IU] via SUBCUTANEOUS
  Administered 2012-06-17: 5 [IU] via SUBCUTANEOUS
  Administered 2012-06-18 (×2): 3 [IU] via SUBCUTANEOUS
  Administered 2012-06-18 – 2012-06-19 (×4): 2 [IU] via SUBCUTANEOUS
  Administered 2012-06-19 – 2012-06-20 (×2): 3 [IU] via SUBCUTANEOUS
  Administered 2012-06-20: 5 [IU] via SUBCUTANEOUS
  Administered 2012-06-20 (×3): 3 [IU] via SUBCUTANEOUS
  Administered 2012-06-20: 2 [IU] via SUBCUTANEOUS
  Administered 2012-06-21 (×4): 3 [IU] via SUBCUTANEOUS
  Administered 2012-06-21: 5 [IU] via SUBCUTANEOUS
  Administered 2012-06-22: 3 [IU] via SUBCUTANEOUS
  Administered 2012-06-22: 2 [IU] via SUBCUTANEOUS
  Administered 2012-06-22 (×4): 3 [IU] via SUBCUTANEOUS
  Administered 2012-06-23 (×3): 5 [IU] via SUBCUTANEOUS
  Administered 2012-06-23: 3 [IU] via SUBCUTANEOUS
  Administered 2012-06-23: 5 [IU] via SUBCUTANEOUS
  Administered 2012-06-23 – 2012-06-24 (×6): 3 [IU] via SUBCUTANEOUS
  Administered 2012-06-24: 5 [IU] via SUBCUTANEOUS
  Administered 2012-06-24 – 2012-06-25 (×2): 3 [IU] via SUBCUTANEOUS
  Administered 2012-06-25: 5 [IU] via SUBCUTANEOUS
  Administered 2012-06-25 (×3): 3 [IU] via SUBCUTANEOUS
  Administered 2012-06-26: 5 [IU] via SUBCUTANEOUS
  Administered 2012-06-26: 2 [IU] via SUBCUTANEOUS
  Administered 2012-06-26 (×2): 3 [IU] via SUBCUTANEOUS
  Administered 2012-06-26: 5 [IU] via SUBCUTANEOUS
  Administered 2012-06-26: 2 [IU] via SUBCUTANEOUS
  Administered 2012-06-27 (×2): 3 [IU] via SUBCUTANEOUS
  Administered 2012-06-27: 5 [IU] via SUBCUTANEOUS
  Administered 2012-06-27: 3 [IU] via SUBCUTANEOUS
  Administered 2012-06-27: 2 [IU] via SUBCUTANEOUS
  Administered 2012-06-27: 5 [IU] via SUBCUTANEOUS
  Administered 2012-06-28 (×2): 3 [IU] via SUBCUTANEOUS
  Administered 2012-06-28: 2 [IU] via SUBCUTANEOUS
  Administered 2012-06-28: 3 [IU] via SUBCUTANEOUS
  Administered 2012-06-28: 2 [IU] via SUBCUTANEOUS
  Administered 2012-06-28: 3 [IU] via SUBCUTANEOUS
  Administered 2012-06-29 (×2): 2 [IU] via SUBCUTANEOUS
  Administered 2012-06-29: 3 [IU] via SUBCUTANEOUS
  Administered 2012-06-29 (×3): 2 [IU] via SUBCUTANEOUS
  Administered 2012-06-30: 3 [IU] via SUBCUTANEOUS
  Administered 2012-06-30: 2 [IU] via SUBCUTANEOUS
  Administered 2012-06-30: 3 [IU] via SUBCUTANEOUS
  Administered 2012-07-01 – 2012-07-03 (×9): 2 [IU] via SUBCUTANEOUS
  Administered 2012-07-05: 3 [IU] via SUBCUTANEOUS
  Administered 2012-07-05: 2 [IU] via SUBCUTANEOUS
  Administered 2012-07-05: 3 [IU] via SUBCUTANEOUS
  Administered 2012-07-05: 2 [IU] via SUBCUTANEOUS
  Administered 2012-07-06: 05:00:00 via SUBCUTANEOUS
  Administered 2012-07-06: 2 [IU] via SUBCUTANEOUS
  Administered 2012-07-06: 3 [IU] via SUBCUTANEOUS
  Administered 2012-07-06: 100 [IU] via SUBCUTANEOUS
  Administered 2012-07-06 – 2012-07-08 (×5): 2 [IU] via SUBCUTANEOUS
  Administered 2012-07-08: 3 [IU] via SUBCUTANEOUS
  Administered 2012-07-08 (×2): 2 [IU] via SUBCUTANEOUS
  Administered 2012-07-08: 3 [IU] via SUBCUTANEOUS
  Administered 2012-07-09 (×4): 2 [IU] via SUBCUTANEOUS
  Administered 2012-07-10 – 2012-07-11 (×2): 3 [IU] via SUBCUTANEOUS
  Administered 2012-07-11 – 2012-07-12 (×7): 2 [IU] via SUBCUTANEOUS
  Administered 2012-07-12: 3 [IU] via SUBCUTANEOUS
  Administered 2012-07-12 – 2012-07-13 (×2): 2 [IU] via SUBCUTANEOUS
  Administered 2012-07-13: 3 [IU] via SUBCUTANEOUS
  Administered 2012-07-13: 2 [IU] via SUBCUTANEOUS
  Administered 2012-07-13 – 2012-07-14 (×2): 3 [IU] via SUBCUTANEOUS
  Administered 2012-07-14 – 2012-07-15 (×8): 2 [IU] via SUBCUTANEOUS
  Administered 2012-07-15: 3 [IU] via SUBCUTANEOUS
  Administered 2012-07-15 (×2): 2 [IU] via SUBCUTANEOUS
  Administered 2012-07-16: 3 [IU] via SUBCUTANEOUS
  Administered 2012-07-16 (×3): 2 [IU] via SUBCUTANEOUS
  Administered 2012-07-17: 3 [IU] via SUBCUTANEOUS
  Administered 2012-07-17 – 2012-07-20 (×10): 2 [IU] via SUBCUTANEOUS
  Administered 2012-07-20: 3 [IU] via SUBCUTANEOUS
  Administered 2012-07-21 – 2012-07-25 (×11): 2 [IU] via SUBCUTANEOUS
  Administered 2012-07-25: 3 [IU] via SUBCUTANEOUS
  Administered 2012-07-26 (×2): 2 [IU] via SUBCUTANEOUS
  Administered 2012-07-27: 3 [IU] via SUBCUTANEOUS
  Administered 2012-07-27: 2 [IU] via SUBCUTANEOUS
  Administered 2012-07-27: 3 [IU] via SUBCUTANEOUS
  Administered 2012-07-28 – 2012-07-30 (×3): 2 [IU] via SUBCUTANEOUS
  Administered 2012-07-30: 3 [IU] via SUBCUTANEOUS
  Administered 2012-07-31: 2 [IU] via SUBCUTANEOUS
  Administered 2012-07-31: 3 [IU] via SUBCUTANEOUS
  Administered 2012-07-31 (×3): 2 [IU] via SUBCUTANEOUS
  Administered 2012-08-01: 3 [IU] via SUBCUTANEOUS
  Administered 2012-08-01 (×2): 2 [IU] via SUBCUTANEOUS
  Administered 2012-08-03 (×2): 3 [IU] via SUBCUTANEOUS
  Administered 2012-08-04 – 2012-08-05 (×3): 2 [IU] via SUBCUTANEOUS
  Administered 2012-08-05 – 2012-08-08 (×5): 3 [IU] via SUBCUTANEOUS
  Administered 2012-08-08: 2 [IU] via SUBCUTANEOUS
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
## 2012-06-03 NOTE — Progress Notes (Signed)
*  PRELIMINARY RESULTS* Vascular Ultrasound Lower extremity venous duplex has been completed.  Preliminary findings: bilaterally no evidence of DVT or baker's cyst.  Farrel Demark, RDMS, RVT  06/03/2012, 3:04 PM

## 2012-06-03 NOTE — Progress Notes (Signed)
Subjective: Patient reports sedated and on ventilator  Objective: Vital signs in last 24 hours: Temp:  [98.8 F (37.1 C)-100.4 F (38 C)] 98.8 F (37.1 C) (09/02 0500) Pulse Rate:  [80-106] 80  (09/02 0500) Resp:  [14-20] 18  (09/02 0500) BP: (116-146)/(50-64) 120/55 mmHg (09/02 0500) SpO2:  [90 %-100 %] 100 % (09/02 0500) FiO2 (%):  [39.9 %-99.5 %] 50.1 % (09/02 0500)  Intake/Output from previous day: 09/01 0701 - 09/02 0700 In: 1679.7 [I.V.:1539.7] Out: 2325 [Urine:1975; Chest Tube:350] Intake/Output this shift: Total I/O In: 600 [I.V.:600] Out: 625 [Urine:625]  Physical Exam: Sedated and on ventilator.  Much better SaO2 after bronchoscopy.  Becomes agitated when sedation withdrawn.   Lab Results:  [facility] 06/03/12 0511 06/02/12 0458  WBC 12.4* 17.1*  HGB 7.2* 8.8*  HCT 20.4* 25.4*  PLT 101* 127*   BMET  Basename 06/03/12 0511 06/02/12 0458  NA 129* 130*  K 4.8 4.8  CL 98 97  CO2 25 24  GLUCOSE 208* 220*  BUN 28* 35*  CREATININE 1.37* 1.84*  CALCIUM 7.8* 7.6*    Studies/Results: Dg Chest Port 1 View  06/02/2012  *RADIOLOGY REPORT*  Clinical Data: Intubated  PORTABLE CHEST - 1 VIEW  Comparison:   the previous day's study  Findings: Endotracheal tube, nasogastric tube, and bilateral chest tubes are noted.  The proximal side hole of the right chest tube is external to the thoracic cage.  There are multiple displaced right rib fractures as before.  No pneumothorax. There is bilateral mild subcutaneous emphysema as before.  Heart size upper limits normal. No effusion.  IMPRESSION:  1.Bilateral chest tubes without pneumothorax.   Original Report Authenticated By: Osa Craver, M.D.    Dg Chest Port 1 View  06/01/2012  *RADIOLOGY REPORT*  Clinical Data: a follow-up chest tubes and subcutaneous emphysema.  PORTABLE CHEST - 1 VIEW  Comparison: Yesterday.  Findings: Bilateral chest tubes remain in place.  The side hole of the chest tube on the right is in the  soft tissues lateral to the ribs.  There is slightly less adjacent subcutaneous emphysema on the right.  The previously seen subcutaneous emphysema on the left is not included on today's image.  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  Endotracheal tube in satisfactory position. Nasogastric tube extending into the stomach.  A probable hiatal hernia is noted.  The cardiac silhouette is enlarged.  The interstitial markings remain prominent.  No pneumothorax on either side.  IMPRESSION:  1.  The right chest tube side hole is in the soft tissues with slightly improved adjacent subcutaneous emphysema. 2.  Stable cardiomegaly and mild chronic interstitial lung disease. 3.  Probable hiatal hernia.   Original Report Authenticated By: Darrol Angel, M.D.    Ct Portable Head W/o Cm  06/01/2012  *RADIOLOGY REPORT*  Clinical Data: Follow-up intracranial hemorrhage following significant facial and head trauma.  CT HEAD WITHOUT CONTRAST  Technique:  Contiguous axial images were obtained from the base of the skull through the vertex without contrast.  Comparison: Yesterday.  Findings: The previously demonstrated 2.5 cm peripheral right frontal lobe hemorrhage is slightly different in shape without significant change in overall size, continuing to measure 2.5 cm in maximum diameter.  The previously demonstrated 1.2 cm area of parenchymal hemorrhage more superiorly in the right frontal lobe has not changed significantly, measuring 1.1 cm in maximum diameter.  Interval visualization  of a 6 mm area of parenchymal hemorrhage more superiorly in the right frontal lobe.  There is also interval visualization of a 6 mm linear area of hemorrhage along the posterior aspect of a sulcus in the high right parietal lobe, possibly within the cerebral cortex.  Interval 6 mm area of hemorrhage in the right occipital lobe and 4 mm area of  probable hemorrhage in the right occipital lobe.  Interval small amount of subarachnoid hemorrhage bilaterally.  Right extracranial and facial soft tissue swelling is again demonstrated along with visualization of some of the previously described facial fractures.  There is associated blood and mucosal thickening in the sinuses.  The there is no midline shift or evidence of transtentorial herniation.  The ventricles are normal in size and position.  IMPRESSION: Interval small amount of subarachnoid hemorrhage and small new areas of parenchymal hemorrhage, as described above.   Original Report Authenticated By: Darrol Angel, M.D.     Assessment/Plan: Continue support.  Wean sedation as tolerated.    LOS: 3 days    Dorian Heckle, MD 06/03/2012, 6:47 AM

## 2012-06-03 NOTE — Progress Notes (Signed)
Corrected family contact information: Bonita Quin & Eugene Garnet (niece & nephew): [phone]
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
## 2012-06-03 NOTE — Progress Notes (Signed)
MD called to initiate part 3 of hyperglycemia protocol after 6 CBG's within range. Will continue to monitor per protocol. Alex Foster

## 2012-06-03 NOTE — Clinical Social Work Psychosocial (Signed)
Clinical Social Work Department BRIEF PSYCHOSOCIAL ASSESSMENT 06/03/2012  Patient:  Alex Foster, Alex Foster     Account Number:  [id number]     Admit date:  05/31/2012  Clinical Social Worker:  Thomasene Mohair  Date/Time:  06/03/2012 11:50 AM  Referred by:  Physician  Date Referred:  06/03/2012 Referred for  Psychosocial assessment   Other Referral:   DC planning   Interview type:  Other - See comment Other interview type:   chart review:  Pt has additional medical record, Pt too acute for interview, no family at bedside or available by phone at this time.    PSYCHOSOCIAL DATA Living Status:  OTHER RELATIVE Admitted from facility:   Level of care:   Primary support name:  Alex Foster Primary support relationship to patient:  FAMILY (niece and her spouse) Degree of support available:   adequate.    CURRENT CONCERNS Current Concerns  Adjustment to Illness  Post-Acute Placement   Other Concerns:   Hx of substance abuse, alcohol    SOCIAL WORK ASSESSMENT / PLAN CSW was referred to Pt to assist with psychosocial assessment for trauma service. Pt is intubated and not communicating a the time of assessment. Pt does not have family at bedside, though they have been in touch with hospital staff over the weekend. Pt's nieces' number in the medical record does not work, CSW unable to get in touch with the family at this time.  Pt has 2 medical records. He is an established Pt at [facility] and goes to appts regularly from the looks of his chart review.  Pt has a history of going to SNF [facility] within the last 2 years.  Pt has also had home health before he went to SNF. CSW is unsure of Pt's current living situation unknown, not too long ago t i was documented that he lived  with his niece and her husband.  CSW will continue to try to reach family to further assess potential needs in the hospital and at DC.   Assessment/plan status:  Psychosocial Support/Ongoing  Assessment of Needs Other assessment/ plan:   Information/referral to community resources:    PATIENT'S/FAMILY'S RESPONSE TO PLAN OF CARE: No family at bedside at this time. Pt's niece and her husband have been to see Pt during the hospitalization. The number in the medical record is not working, CSW Will try to find updated contact info as well.    Alex Foster, [place]  [phone] ED Clinical Social Worker  Covering for Trauma CSW [name], [place]

## 2012-06-04 ENCOUNTER — Inpatient Hospital Stay (HOSPITAL_COMMUNITY): Payer: Medicaid Other

## 2012-06-04 ENCOUNTER — Ambulatory Visit: Payer: Medicaid Other | Admitting: Family Medicine

## 2012-06-04 ENCOUNTER — Encounter (HOSPITAL_COMMUNITY): Payer: Self-pay

## 2012-06-04 DIAGNOSIS — I441 Atrioventricular block, second degree: Secondary | ICD-10-CM

## 2012-06-04 DIAGNOSIS — J189 Pneumonia, unspecified organism: Secondary | ICD-10-CM

## 2012-06-04 DIAGNOSIS — R570 Cardiogenic shock: Secondary | ICD-10-CM

## 2012-06-04 DIAGNOSIS — I469 Cardiac arrest, cause unspecified: Secondary | ICD-10-CM

## 2012-06-04 HISTORY — DX: Atrioventricular block, second degree: I44.1

## 2012-06-04 LAB — COMPREHENSIVE METABOLIC PANEL
AST: 22 U/L (ref 0–37)
Albumin: 2.3 g/dL — ABNORMAL LOW (ref 3.5–5.2)
Calcium: 7.8 mg/dL — ABNORMAL LOW (ref 8.4–10.5)
Creatinine, Ser: 1.07 mg/dL (ref 0.50–1.35)

## 2012-06-04 LAB — CBC
HCT: 24.7 % — ABNORMAL LOW (ref 39.0–52.0)
Hemoglobin: 6.7 g/dL — CL (ref 13.0–17.0)
MCH: 30 pg (ref 26.0–34.0)
MCHC: 34.4 g/dL (ref 30.0–36.0)
MCV: 87.9 fL (ref 78.0–100.0)
MCV: 86.1 fL (ref 78.0–100.0)
RBC: 2.23 MIL/uL — ABNORMAL LOW (ref 4.22–5.81)
RDW: 14.6 % (ref 11.5–15.5)

## 2012-06-04 LAB — BLOOD GAS, ARTERIAL
Acid-base deficit: 1.1 mmol/L (ref 0.0–2.0)
Bicarbonate: 19.2 mEq/L — ABNORMAL LOW (ref 20.0–24.0)
Bicarbonate: 25 mEq/L — ABNORMAL HIGH (ref 20.0–24.0)
Drawn by: 347641
Drawn by: 32470
MECHVT: 520 mL
MECHVT: 620 mL
O2 Saturation: 93.5 %
PEEP: 8 cmH2O
PEEP: 8 cmH2O
Patient temperature: 98.6
RATE: 18 resp/min
TCO2: 25.4 mmol/L (ref 0–100)
pCO2 arterial: 47.8 mmHg — ABNORMAL HIGH (ref 35.0–45.0)
pCO2 arterial: 49.2 mmHg — ABNORMAL HIGH (ref 35.0–45.0)
pCO2 arterial: 46.2 mmHg — ABNORMAL HIGH (ref 35.0–45.0)
pCO2 arterial: 34.4 mmHg — ABNORMAL LOW (ref 35.0–45.0)
pH, Arterial: 7.227 — ABNORMAL LOW (ref 7.350–7.450)
pH, Arterial: 7.313 — ABNORMAL LOW (ref 7.350–7.450)
pH, Arterial: 7.464 — ABNORMAL HIGH (ref 7.350–7.450)
pO2, Arterial: 204 mmHg — ABNORMAL HIGH (ref 80.0–100.0)
pO2, Arterial: 61.2 mmHg — ABNORMAL LOW (ref 80.0–100.0)
pO2, Arterial: 190 mmHg — ABNORMAL HIGH (ref 80.0–100.0)

## 2012-06-04 LAB — TYPE AND SCREEN
Unit division: 0
Unit division: 0
Unit division: 0

## 2012-06-04 LAB — GLUCOSE, CAPILLARY: Glucose-Capillary: 123 mg/dL — ABNORMAL HIGH (ref 70–99)

## 2012-06-04 LAB — PREPARE RBC (CROSSMATCH)

## 2012-06-04 MED ORDER — ATROPINE SULFATE 0.1 MG/ML IJ SOLN
1.0000 mg | INTRAMUSCULAR | Status: DC | PRN
  Filled 2012-06-04 (×2): qty 10

## 2012-06-04 MED ORDER — SODIUM CHLORIDE 0.9 % IJ SOLN
10.0000 mL | Freq: Two times a day (BID) | INTRAMUSCULAR | Status: DC
  Administered 2012-06-04 – 2012-06-06 (×4): 10 mL
  Administered 2012-06-07: 20 mL
  Administered 2012-06-07 – 2012-06-11 (×9): 10 mL
  Administered 2012-06-12: 20 mL
  Administered 2012-06-13 – 2012-06-15 (×6): 10 mL
  Administered 2012-06-15 – 2012-06-16 (×2): 30 mL
  Administered 2012-06-16: 20 mL
  Administered 2012-06-17: 10 mL
  Administered 2012-06-17: 30 mL
  Administered 2012-06-19 – 2012-06-22 (×8): 10 mL
  Administered 2012-06-23: 20 mL
  Administered 2012-06-23 – 2012-06-24 (×2): 30 mL
  Administered 2012-06-24: 20 mL
  Administered 2012-06-25: 30 mL
  Administered 2012-06-26: 10 mL
  Administered 2012-06-26: 30 mL
  Administered 2012-06-27 – 2012-06-30 (×6): 10 mL
  Administered 2012-06-30: 20 mL
  Administered 2012-07-01 – 2012-08-04 (×60): 10 mL
  Administered 2012-08-05: 20 mL
  Administered 2012-08-06 (×2): 10 mL

## 2012-06-04 MED ORDER — DOPAMINE-DEXTROSE 3.2-5 MG/ML-% IV SOLN
5.0000 ug/kg/min | INTRAVENOUS | Status: DC
  Administered 2012-06-04: 5 ug/kg/min via INTRAVENOUS
  Filled 2012-06-04: qty 250

## 2012-06-04 MED ORDER — PIVOT 1.5 CAL PO LIQD
1000.0000 mL | ORAL | Status: DC
  Administered 2012-06-04 – 2012-06-26 (×19): 1000 mL
  Filled 2012-06-04 (×28): qty 1000

## 2012-06-04 MED ORDER — ADULT MULTIVITAMIN LIQUID CH
5.0000 mL | Freq: Every day | ORAL | Status: DC
  Administered 2012-06-04 – 2012-08-08 (×66): 5 mL
  Filled 2012-06-04 (×74): qty 5

## 2012-06-04 MED ORDER — PRO-STAT SUGAR FREE PO LIQD
60.0000 mL | Freq: Every day | ORAL | Status: DC
  Administered 2012-06-04 – 2012-07-01 (×132): 60 mL
  Filled 2012-06-04 (×146): qty 60

## 2012-06-04 MED ORDER — JEVITY 1.2 CAL PO LIQD
1000.0000 mL | ORAL | Status: DC
  Filled 2012-06-04 (×2): qty 1000

## 2012-06-04 MED ORDER — SODIUM CHLORIDE 0.9 % IJ SOLN
10.0000 mL | INTRAMUSCULAR | Status: DC | PRN
  Administered 2012-07-07: 10 mL

## 2012-06-04 MED ORDER — PNEUMOCOCCAL VAC POLYVALENT 25 MCG/0.5ML IJ INJ
0.5000 mL | INJECTION | INTRAMUSCULAR | Status: AC
  Administered 2012-06-05: 0.5 mL via INTRAMUSCULAR
  Filled 2012-06-04: qty 0.5

## 2012-06-04 MED ORDER — VANCOMYCIN HCL IN DEXTROSE 1-5 GM/200ML-% IV SOLN
1000.0000 mg | Freq: Two times a day (BID) | INTRAVENOUS | Status: DC
  Administered 2012-06-04 – 2012-06-05 (×3): 1000 mg via INTRAVENOUS
  Filled 2012-06-04 (×4): qty 200
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
## 2012-06-04 NOTE — Progress Notes (Signed)
Patient ID: Alex Foster, male   DOB: 06-23-1946, 66 y.o.   MRN: [id number] Rhythm strip shows intermittent heart block.  EKG with R BBB and LAF block.  I will ask cardiology to consult. BP stable. Violeta Gelinas, MD, MPH, FACS Pager: [phone]
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
## 2012-06-04 NOTE — Progress Notes (Signed)
Pt w/ new episodes of relative bradycardia (baseline 70s-80s, intermittently decr to 50s) & PVCs; BP unchanged.  Dr. Janee Morn notified, here to see pt, orders received.;

## 2012-06-04 NOTE — Progress Notes (Signed)
UR completed 
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
## 2012-06-04 NOTE — Progress Notes (Signed)
CRITICAL VALUE ALERT  Critical value received:  HGB 6.7  Date of notification:  06/04/12  Time of notification:  0450  Critical value read back:yes  Nurse who received alert: Amie Critchley, RN  MD notified (1st page):  Dr. Magnus Ivan  Time of first page:  [phone]  MD notified (2nd page):  Time of second page:  Responding MD:  Dr. Magnus Ivan  Time MD responded:  [phone]

## 2012-06-04 NOTE — Progress Notes (Signed)
Subjective: Patient continues on ventilator, via endotracheal tube. Continues on inotropic support with Levophed, also on vasopressin drip. Sedated with Versed and fentanyl.  Objective: Vital signs in last 24 hours: Filed Vitals:   06/04/12 0700 06/04/12 0734 06/04/12 0740 06/04/12 0800  BP: 130/60  132/58 180/66  Pulse: 71 70  78  Temp: 99 F (37.2 C) 98.8 F (37.1 C)  98.8 F (37.1 C)  TempSrc:      Resp: 18 18  20   Height:      Weight:      SpO2: 99% 99%  96%    Intake/Output from previous day: 09/02 0701 - 09/03 0700 In: 3968.9 [I.V.:3308.9; IV Piggyback:660] Out: 2545 [Urine:2275; Chest Tube:270] Intake/Output this shift: Total I/O In: 127.1 [I.V.:127.1] Out: 80 [Urine:80]  Physical Exam:  Opens eyes weakly to voice, opens eyes more vigorously to pain. Localizes on right, not following commands. Weak movement on left, consistent with left hemiparesis. Pupils 1.5 mm bilaterally, nonreactive to light.  CBC  Basename 06/04/12 0420 06/03/12 0511  WBC 9.5 12.4*  HGB 6.7* 7.2*  HCT 19.6* 20.4*  PLT 138* 101*   BMET  Basename 06/04/12 0420 06/03/12 0511  NA 130* 129*  K 4.0 4.8  CL 100 98  CO2 25 25  GLUCOSE 144* 208*  BUN 23 28*  CREATININE 1.07 1.37*  CALCIUM 7.8* 7.8*   ABG    Component Value Date/Time   PHART 7.313* 06/04/2012 0349   PCO2ART 49.2* 06/04/2012 0349   PO2ART 66.6* 06/04/2012 0349   HCO3 24.2* 06/04/2012 0349   TCO2 25.8 06/04/2012 0349   ACIDBASEDEF 1.1 06/04/2012 0349   O2SAT 94.6 06/04/2012 0349    Studies/Results: Dg Chest Port 1 View  06/04/2012  *RADIOLOGY REPORT*  Clinical Data: Pneumothorax.  Chest tubes.  PORTABLE CHEST - 1 VIEW  Comparison: Chest 06/03/2012.  Findings:  Bilateral chest tubes are in place.  No pneumothorax is identified.  The right costophrenic angle is off the margin of the film.  There is cardiomegaly.  Multiple bilateral rib fractures are again seen.  No consolidative process is identified.  IMPRESSION:  1.  Negative for  pneumothorax with bilateral chest tube in place. 2.  Bilateral rib fractures.   Original Report Authenticated By: Bernadene Bell. Maricela Curet, M.D.    Dg Chest Port 1 View  06/03/2012  *RADIOLOGY REPORT*  Clinical Data: Intubated.  PORTABLE CHEST - 1 VIEW  Comparison: 1 day prior  Findings: Endotracheal tube unchanged.  Nasogastric extends beyond the  inferior aspect of the film.  Bilateral chest tubes are unchanged in position. Numerous leads and wires project over the chest.  Mild cardiomegaly.  No pleural fluid.  There is minimal subcutaneous emphysema about the bilateral chest wall.  Suspect a 5% left apical pneumothorax.  No right-sided pneumothorax. Numerous right-sided rib fractures. No congestive failure.  IMPRESSION:  1.  Bilateral chest tubes remaining in place.  Suspect a 5% left apical pneumothorax. 2. Cardiomegaly without congestive failure.   Original Report Authenticated By: Consuello Bossier, M.D.     Assessment/Plan: Stable from a neurosurgical perspective. Discussed this is present drip and hyponatremia with Drs [name] and Janee Morn. Will need followup CT brain.   Hewitt Shorts, MD 06/04/2012, 8:51 AM

## 2012-06-04 NOTE — Progress Notes (Signed)
Pt. appears to have an air leak in chest tube mid-tube.  Reported to Dr. Magnus Ivan on call.  M.D. stated that this was found yesterday and nothing is to be done about this at this time.  Will continue to monitor pt.

## 2012-06-04 NOTE — Progress Notes (Signed)
Peripherally Inserted Central Catheter/Midline Placement  The IV Nurse has discussed with the patient and/or persons authorized to consent for the patient, the purpose of this procedure and the potential benefits and risks involved with this procedure.  The benefits include less needle sticks, lab draws from the catheter and patient may be discharged home with the catheter.  Risks include, but not limited to, infection, bleeding, blood clot (thrombus formation), and puncture of an artery; nerve damage and irregular heat beat.  Alternatives to this procedure were also discussed. Consent obtained previously from family.   PICC/Midline Placement Documentation        Alex Foster 06/04/2012, 8:24 AM

## 2012-06-04 NOTE — Progress Notes (Addendum)
Patient ID: Alex Foster, male   DOB: 03-Jun-1946, 66 y.o.   MRN: [id number] Follow up - Trauma Critical Care  Patient Details:    Alex Foster is an 66 y.o. male.  Lines/tubes : Airway 7.5 mm (Active)  Secured at (cm) 24 cm 06/04/2012  7:31 AM  Measured From Lips 06/04/2012  7:31 AM  Secured Location Right 06/04/2012  7:31 AM  Secured By [employer] 06/04/2012  7:31 AM  Tube Holder Repositioned Yes 06/04/2012  7:31 AM  Cuff Pressure (cm H2O) 26 cm H2O 06/03/2012  7:39 PM  Site Condition Dry 06/04/2012  7:31 AM     PICC Triple Lumen 06/04/12 PICC Right Basilic (Active)  Indication for Insertion or Continuance of Line Vasoactive infusions;Prolonged intravenous therapies;Limited venous access - need for IV therapy >5 days (PICC only) 06/04/2012  8:35 AM  Length mark (cm) 0 cm 06/04/2012  8:35 AM  Site Assessment Clean;Dry;Intact 06/04/2012  8:35 AM  Lumen #1 Status Flushed;Saline locked 06/04/2012  8:35 AM  Lumen #2 Status Flushed;Saline locked 06/04/2012  8:35 AM  Lumen #3 Status Flushed;Saline locked 06/04/2012  8:35 AM  Dressing Type Transparent 06/04/2012  8:35 AM  Dressing Status Clean;Dry;Intact;Antimicrobial disc in place 06/04/2012  8:35 AM     CVC Triple Lumen 05/31/12 Right Femoral (Active)  Indication for Insertion or Continuance of Line Vasoactive infusions 06/04/2012  8:00 AM  Site Assessment Clean;Dry 06/04/2012  8:00 AM  Proximal Lumen Status Infusing 06/04/2012  8:00 AM  Medial Infusing 06/04/2012  8:00 AM  Distal Lumen Status Infusing 06/04/2012  8:00 AM  Dressing Type Transparent 06/04/2012  8:00 AM  Dressing Status Clean;Dry;Intact 06/04/2012  8:00 AM  Dressing Change Due 06/05/12 06/02/2012  8:00 PM     Arterial Line 05/31/12 Left Radial (Active)  Site Assessment Clean;Dry 06/04/2012  8:00 AM  Line Status Pulsatile blood flow 06/04/2012  8:00 AM  Art Line Waveform Appropriate 06/04/2012  8:00 AM  Art Line Interventions Zeroed and calibrated 06/04/2012  8:00 AM  Color/Movement/Sensation Capillary  refill less than 3 sec 06/04/2012  8:00 AM  Dressing Type Transparent 06/04/2012  8:00 AM  Dressing Status Clean;Intact;Dry 06/04/2012  8:00 AM     Chest Tube Right (Active)  Suction -20 cm H2O 06/04/2012  8:00 AM  Chest Tube Air Leak Minimal 06/04/2012  8:00 AM  Patency Intervention Tip/tilt 06/03/2012  8:00 PM  Drainage Description Serosanguineous 06/04/2012  8:00 AM  Dressing Status Clean;Dry;Intact 06/04/2012  8:00 AM  Dressing Intervention Dressing changed 05/31/2012 11:30 PM  Site Assessment Clean;Dry;Intact 06/04/2012  8:00 AM  Surrounding Skin Intact 06/04/2012  8:00 AM  Output (mL) 25 mL 06/04/2012  6:00 AM     Chest Tube Left (Active)  Suction -20 cm H2O 06/04/2012  8:00 AM  Chest Tube Air Leak None 06/04/2012  8:00 AM  Drainage Description Serosanguineous 06/04/2012  8:00 AM  Dressing Status Clean;Dry;Intact 06/04/2012  8:00 AM  Dressing Intervention New dressing 06/03/2012  8:00 PM  Site Assessment Clean;Dry;Intact 06/04/2012  8:00 AM  Surrounding Skin Intact 06/04/2012  8:00 AM  Output (mL) 120 mL 06/04/2012  6:00 AM     NG/OG Tube Orogastric 16 Fr. Center mouth (Active)  Placement Verification Auscultation 06/04/2012  8:00 AM  Site Assessment Clean;Dry;Intact 06/04/2012  8:00 AM  Status Suction-low intermittent 06/04/2012  8:00 AM  Drainage Appearance Bile 06/04/2012  8:00 AM  Output (mL) 300 mL 06/02/2012  6:00 AM     Urethral Catheter Temperature probe (Active)  Indication for Insertion or  Continuance of Catheter Urinary output monitoring;Physician order 06/04/2012  8:00 AM  Site Assessment Clean;Intact 06/04/2012  8:00 AM  Collection Container Standard drainage bag 06/04/2012  8:00 AM  Securement Method Leg strap 06/04/2012  8:00 AM  Urinary Catheter Interventions Unclamped 06/03/2012  8:00 PM    Microbiology/Sepsis markers: Results for orders placed during the hospital encounter of 05/31/12  MRSA PCR SCREENING     Status: Abnormal   Collection Time   05/31/12  8:58 PM      Component Value Range Status Comment    MRSA by PCR INVALID RESULTS, SPECIMEN SENT FOR CULTURE (*) NEGATIVE Final   MRSA CULTURE     Status: Normal   Collection Time   05/31/12  8:58 PM      Component Value Range Status Comment   Specimen Description NOSE   Final    Special Requests NONE   Final    Culture     Final    Value: NO STAPHYLOCOCCUS AUREUS ISOLATED     Note: NO MRSA ISOLATED   Report Status 06/03/2012 FINAL   Final   CULTURE, RESPIRATORY     Status: Normal (Preliminary result)   Collection Time   06/02/12  4:07 PM      Component Value Range Status Comment   Specimen Description BRONCHIAL ALVEOLAR LAVAGE   Final    Special Requests NONE   Final    Gram Stain PENDING   Incomplete    Culture Culture reincubated for better growth   Final    Report Status PENDING   Incomplete     Anti-infectives:  Anti-infectives     Start     Dose/Rate Route Frequency Ordered Stop   06/04/12 1000   vancomycin (VANCOCIN) IVPB 1000 mg/200 mL premix        1,000 mg 200 mL/hr over 60 Minutes Intravenous Every 12 hours 06/04/12 0837     06/03/12 1800   vancomycin (VANCOCIN) 1,500 mg in sodium chloride 0.9 % 500 mL IVPB  Status:  Discontinued        1,500 mg 250 mL/hr over 120 Minutes Intravenous Every 24 hours 06/02/12 1428 06/04/12 0837   06/02/12 1800   vancomycin (VANCOCIN) 2,000 mg in sodium chloride 0.9 % 500 mL IVPB        2,000 mg 250 mL/hr over 120 Minutes Intravenous  Once 06/02/12 1428 06/02/12 2024   06/02/12 1400   cefTAZidime (FORTAZ) 1 g in dextrose 5 % 50 mL IVPB        1 g 100 mL/hr over 30 Minutes Intravenous 3 times per day 06/02/12 1349            Best Practice/Protocols:  VTE Prophylaxis: Mechanical Continous Sedation  Consults: Treatment Team:  Hewitt Shorts, MD Melvenia Beam, MD Md Pccm, MD    Studies: Dg Chest Port 1 View  06/04/2012  *RADIOLOGY REPORT*  Clinical Data: Pneumothorax.  Chest tubes.  PORTABLE CHEST - 1 VIEW  Comparison: Chest 06/03/2012.  Findings:  Bilateral chest tubes are  in place.  No pneumothorax is identified.  The right costophrenic angle is off the margin of the film.  There is cardiomegaly.  Multiple bilateral rib fractures are again seen.  No consolidative process is identified.  IMPRESSION:  1.  Negative for pneumothorax with bilateral chest tube in place. 2.  Bilateral rib fractures.   Original Report Authenticated By: Bernadene Bell. Maricela Curet, M.D.    Dg Chest Port 1 View  06/03/2012  *RADIOLOGY REPORT*  Clinical  Data: Intubated.  PORTABLE CHEST - 1 VIEW  Comparison: 1 day prior  Findings: Endotracheal tube unchanged.  Nasogastric extends beyond the  inferior aspect of the film.  Bilateral chest tubes are unchanged in position. Numerous leads and wires project over the chest.  Mild cardiomegaly.  No pleural fluid.  There is minimal subcutaneous emphysema about the bilateral chest wall.  Suspect a 5% left apical pneumothorax.  No right-sided pneumothorax. Numerous right-sided rib fractures. No congestive failure.  IMPRESSION:  1.  Bilateral chest tubes remaining in place.  Suspect a 5% left apical pneumothorax. 2. Cardiomegaly without congestive failure.   Original Report Authenticated By: Consuello Bossier, M.D.    Events:  Subjective:    Overnight Issues:   Objective:  Vital signs for last 24 hours: Temp:  [98.6 F (37 C)-99 F (37.2 C)] 98.8 F (37.1 C) (09/03 0800) Pulse Rate:  [66-83] 78  (09/03 0800) Resp:  [18-54] 20  (09/03 0800) BP: (109-180)/(46-95) 180/66 mmHg (09/03 0800) SpO2:  [94 %-100 %] 96 % (09/03 0800) FiO2 (%):  [39.8 %-50.7 %] 40.6 % (09/03 0800)  Hemodynamic parameters for last 24 hours: CVP:  [7 mmHg-16 mmHg] 16 mmHg  Intake/Output from previous day: 09/02 0701 - 09/03 0700 In: 3968.9 [I.V.:3308.9; IV Piggyback:660] Out: 2545 [Urine:2275; Chest Tube:270]  Intake/Output this shift: Total I/O In: 127.1 [I.V.:127.1] Out: 80 [Urine:80]  Vent settings for last 24 hours: Vent Mode:  [-] PRVC FiO2 (%):  [39.8 %-50.7 %] 40.6 % Set  Rate:  [18 bmp] 18 bmp Vt Set:  [520 mL] 520 mL PEEP:  [7.8 cmH20-8.3 cmH20] 7.8 cmH20 Plateau Pressure:  [18 cmH20-21 cmH20] 19 cmH20  Physical Exam:  General: on vent HEENT/Neck: collar Resp: clear B, min crepitance CVS: RRR GI: soft, NT, +BS, UH reduces easiy Extremities: abrasions RUE, R knee, L hand and UE Neuro: opens eyes to voice, localizes to pain with RUE but does not F/C and does not move LUE  Results for orders placed during the hospital encounter of 05/31/12 (from the past 24 hour(s))  GLUCOSE, CAPILLARY     Status: Abnormal   Collection Time   06/03/12  9:44 AM      Component Value Range   Glucose-Capillary 139 (*) 70 - 99 mg/dL   Comment 1 Documented in Chart    GLUCOSE, CAPILLARY     Status: Abnormal   Collection Time   06/03/12 11:07 AM      Component Value Range   Glucose-Capillary 169 (*) 70 - 99 mg/dL   Comment 1 Documented in Chart    GLUCOSE, CAPILLARY     Status: Abnormal   Collection Time   06/03/12 12:12 PM      Component Value Range   Glucose-Capillary 129 (*) 70 - 99 mg/dL   Comment 1 Documented in Chart    GLUCOSE, CAPILLARY     Status: Abnormal   Collection Time   06/03/12  1:05 PM      Component Value Range   Glucose-Capillary 121 (*) 70 - 99 mg/dL  GLUCOSE, CAPILLARY     Status: Abnormal   Collection Time   06/03/12  2:11 PM      Component Value Range   Glucose-Capillary 126 (*) 70 - 99 mg/dL   Comment 1 Documented in Chart     Comment 2 Notify RN    GLUCOSE, CAPILLARY     Status: Abnormal   Collection Time   06/03/12  3:17 PM      Component  Value Range   Glucose-Capillary 120 (*) 70 - 99 mg/dL   Comment 1 Documented in Chart    GLUCOSE, CAPILLARY     Status: Abnormal   Collection Time   06/03/12  4:02 PM      Component Value Range   Glucose-Capillary 150 (*) 70 - 99 mg/dL   Comment 1 Documented in Chart    GLUCOSE, CAPILLARY     Status: Abnormal   Collection Time   06/03/12  7:47 PM      Component Value Range   Glucose-Capillary 153 (*) 70  - 99 mg/dL   Comment 1 Notify RN     Comment 2 Documented in Chart    GLUCOSE, CAPILLARY     Status: Abnormal   Collection Time   06/04/12 12:21 AM      Component Value Range   Glucose-Capillary 130 (*) 70 - 99 mg/dL  BLOOD GAS, ARTERIAL     Status: Abnormal   Collection Time   06/04/12  3:49 AM      Component Value Range   FIO2 0.40     Delivery systems VENTILATOR     Mode PRESSURE REGULATED VOLUME CONTROL     VT 520     Rate 18     Peep/cpap 8.0     pH, Arterial 7.313 (*) 7.350 - 7.450   pCO2 arterial 49.2 (*) 35.0 - 45.0 mmHg   pO2, Arterial 66.6 (*) 80.0 - 100.0 mmHg   Bicarbonate 24.2 (*) 20.0 - 24.0 mEq/L   TCO2 25.8  0 - 100 mmol/L   Acid-base deficit 1.1  0.0 - 2.0 mmol/L   O2 Saturation 94.6     Patient temperature 98.6     Collection site A-LINE     Drawn by [id number]     Sample type ARTERIAL DRAW     Allens test (pass/fail) PASS  PASS  COMPREHENSIVE METABOLIC PANEL     Status: Abnormal   Collection Time   06/04/12  4:20 AM      Component Value Range   Sodium 130 (*) 135 - 145 mEq/L   Potassium 4.0  3.5 - 5.1 mEq/L   Chloride 100  96 - 112 mEq/L   CO2 25  19 - 32 mEq/L   Glucose, Bld 144 (*) 70 - 99 mg/dL   BUN 23  6 - 23 mg/dL   Creatinine, Ser 7.82  0.50 - 1.35 mg/dL   Calcium 7.8 (*) 8.4 - 10.5 mg/dL   Total Protein 4.9 (*) 6.0 - 8.3 g/dL   Albumin 2.3 (*) 3.5 - 5.2 g/dL   AST 22  0 - 37 U/L   ALT 15  0 - 53 U/L   Alkaline Phosphatase 90  39 - 117 U/L   Total Bilirubin 0.6  0.3 - 1.2 mg/dL   GFR calc non Af Amer 71 (*) >90 mL/min   GFR calc Af Amer 83 (*) >90 mL/min  CBC     Status: Abnormal   Collection Time   06/04/12  4:20 AM      Component Value Range   WBC 9.5  4.0 - 10.5 K/uL   RBC 2.23 (*) 4.22 - 5.81 MIL/uL   Hemoglobin 6.7 (*) 13.0 - 17.0 g/dL   HCT 95.6 (*) 21.3 - 08.6 %   MCV 87.9  78.0 - 100.0 fL   MCH 30.0  26.0 - 34.0 pg   MCHC 34.2  30.0 - 36.0 g/dL   RDW 57.8  46.9 - 62.9 %  Platelets 138 (*) 150 - 400 K/uL  GLUCOSE, CAPILLARY      Status: Abnormal   Collection Time   06/04/12  4:38 AM      Component Value Range   Glucose-Capillary 120 (*) 70 - 99 mg/dL  PREPARE RBC (CROSSMATCH)     Status: Normal   Collection Time   06/04/12  6:15 AM      Component Value Range   Order Confirmation ORDER PROCESSED BY BLOOD BANK    TYPE AND SCREEN     Status: Normal (Preliminary result)   Collection Time   06/04/12  6:15 AM      Component Value Range   ABO/RH(D) O POS     Antibody Screen NEG     Sample Expiration 06/07/2012     Unit Number [id number]     Blood Component Type RED CELLS,LR     Unit division 00     Status of Unit ALLOCATED     Transfusion Status OK TO TRANSFUSE     Crossmatch Result Compatible     Unit Number [id number]     Blood Component Type RED CELLS,LR     Unit division 00     Status of Unit ALLOCATED     Transfusion Status OK TO TRANSFUSE     Crossmatch Result Compatible    GLUCOSE, CAPILLARY     Status: Normal   Collection Time   06/04/12  7:53 AM      Component Value Range   Glucose-Capillary 96  70 - 99 mg/dL    Assessment & Plan: Present on Admission:  .Multiple rib fractures .TBI (traumatic brain injury) .Extensive facial fractures .Leukocytosis .Lactic acidosis   LOS: 4 days   Additional comments:I reviewed the patient's new clinical lab test results. and CXR [facility] TBI/R frontal ICC/B SAH - per Dr. Newell Coral for F/U CT H today Mult B facial Fx - Dr. Emeline Darling rec non-op management CV - S/P multiple cardiac arrest on arrival, wean vasopressin, wean levophed as tolerated ABL anemia - was down more this AM, to receive 2u PRBC now, will F/U after, this was only a small drop from yesterday so acute blood loss seems stopped VDRF - underventilated, will increase RR, F/U ABG B PTX/Mult B rib Fx - remove R CT as last hole has been outside chest and lung is up, leave L CT on suction for now DM - SSI Renal - crt OK today FEN - start TF, watch Na - should come up off vasopressin ID - BAL P, Vanc and  Fortaz empiric VTE - PAS Critical Care Total Time*: 43 Minutes  Violeta Gelinas, MD, MPH, FACS Pager: [phone]  06/04/2012  *Care during the described time interval was provided by me and/or other providers on the critical care team.  I have reviewed this patient's available data, including medical history, events of note, physical examination and test results as part of my evaluation.

## 2012-06-05 ENCOUNTER — Inpatient Hospital Stay (HOSPITAL_COMMUNITY): Payer: Medicaid Other

## 2012-06-05 DIAGNOSIS — T1490XA Injury, unspecified, initial encounter: Secondary | ICD-10-CM

## 2012-06-05 LAB — BLOOD GAS, ARTERIAL
Bicarbonate: 26 mEq/L — ABNORMAL HIGH (ref 20.0–24.0)
O2 Saturation: 99.4 %
Patient temperature: 98.6
TCO2: 27.2 mmol/L (ref 0–100)

## 2012-06-05 LAB — TYPE AND SCREEN
ABO/RH(D): O POS
Antibody Screen: NEGATIVE
Unit division: 0

## 2012-06-05 LAB — BASIC METABOLIC PANEL
GFR calc Af Amer: >90 mL/min (ref >90)
GFR calc non Af Amer: 86 mL/min — ABNORMAL LOW (ref >90)
Glucose, Bld: 135 mg/dL — ABNORMAL HIGH (ref 70–99)
Potassium: 3.4 mEq/L — ABNORMAL LOW (ref 3.5–5.1)
Sodium: 138 mEq/L (ref 135–145)

## 2012-06-05 LAB — GLUCOSE, CAPILLARY: Glucose-Capillary: 134 mg/dL — ABNORMAL HIGH (ref 70–99)

## 2012-06-05 LAB — CULTURE, RESPIRATORY W GRAM STAIN

## 2012-06-05 LAB — CBC
Hemoglobin: 8.3 g/dL — ABNORMAL LOW (ref 13.0–17.0)
MCHC: 35.3 g/dL (ref 30.0–36.0)
RDW: 14.6 % (ref 11.5–15.5)

## 2012-06-05 MED ORDER — POTASSIUM CHLORIDE 2 MEQ/ML IV SOLN
INTRAVENOUS | Status: DC
  Administered 2012-06-05 – 2012-06-14 (×9): via INTRAVENOUS
  Filled 2012-06-05 (×17): qty 1000

## 2012-06-05 MED ORDER — POTASSIUM CHLORIDE 10 MEQ/100ML IV SOLN
10.0000 meq | INTRAVENOUS | Status: AC
  Administered 2012-06-05 (×3): 10 meq via INTRAVENOUS
  Filled 2012-06-05 (×3): qty 100

## 2012-06-05 NOTE — Progress Notes (Signed)
Subjective: Patient continues on ventilator via endotracheal tube. No longer requiring inotropic support. Does continue on Versed and fentanyl for sedation.  Objective: Vital signs in last 24 hours: Filed Vitals:   06/05/12 0500 06/05/12 0600 06/05/12 0700 06/05/12 0740  BP:      Pulse: 88 85 89 84  Temp: 99.7 F (37.6 C) 99.7 F (37.6 C) 93.4 F (34.1 C) 99.3 F (37.4 C)  TempSrc:      Resp: 23 22 23 24   Height:      Weight:  120.8 kg (266 lb 5.1 oz)    SpO2: 99% 100% 99% 100%    Intake/Output from previous day: 09/03 0701 - 09/04 0700 In: 3805.9 [I.V.:2453.4; Blood:682.5; NG/GT:420; IV Piggyback:250] Out: 3315 [Urine:3165; Chest Tube:150] Intake/Output this shift:    Physical Exam:  Opening eyes spontaneously. Left hemi-paresis persists. Localizes on right, little movement on left.  CBC  Basename 06/05/12 0415 06/04/12 2023  WBC 6.1 6.4  HGB 8.3* 8.5*  HCT 23.5* 24.7*  PLT 117* 114*   BMET  Basename 06/05/12 0415 06/04/12 0420  NA 138 130*  K 3.4* 4.0  CL 105 100  CO2 26 25  GLUCOSE 135* 144*  BUN 19 23  CREATININE 0.95 1.07  CALCIUM 8.2* 7.8*   ABG    Component Value Date/Time   PHART 7.434 06/05/2012 0329   PCO2ART 39.4 06/05/2012 0329   PO2ART 106.0* 06/05/2012 0329   HCO3 26.0* 06/05/2012 0329   TCO2 27.2 06/05/2012 0329   ACIDBASEDEF 1.1 06/04/2012 0349   O2SAT 99.4 06/05/2012 0329    Studies/Results: Dg Chest Port 1 View  06/05/2012  *RADIOLOGY REPORT*  Clinical Data: History of bilateral pneumothoraces, follow-up  PORTABLE CHEST - 1 VIEW  Comparison: Portable chest x-ray of 06/04/2012 and CT chest of 05/31/2012  Findings: There has been an increase in bibasilar opacities most consistent with atelectasis and effusions.  The right chest tube is no longer seen, and the left chest tube remains.  No definite pneumothorax is noted.  Multiple bilateral rib fractures are again noted.  The endotracheal tube is unchanged in position as is the right PICC line, and  cardiomegaly is stable.  IMPRESSION: Increase in bibasilar opacities most consistent with atelectasis and effusions.  No definite pneumothorax.  Right chest tube removed.   Original Report Authenticated By: Juline Patch, M.D.    Dg Chest Port 1 View  06/04/2012  *RADIOLOGY REPORT*  Clinical Data: Pneumothorax.  Chest tubes.  PORTABLE CHEST - 1 VIEW  Comparison: Chest 06/03/2012.  Findings:  Bilateral chest tubes are in place.  No pneumothorax is identified.  The right costophrenic angle is off the margin of the film.  There is cardiomegaly.  Multiple bilateral rib fractures are again seen.  No consolidative process is identified.  IMPRESSION:  1.  Negative for pneumothorax with bilateral chest tube in place. 2.  Bilateral rib fractures.   Original Report Authenticated By: Bernadene Bell. Maricela Curet, M.D.    Ct Portable Head W/o Cm  06/04/2012  *RADIOLOGY REPORT*  Clinical Data: 66 year old male with intracranial hemorrhage related to trauma.  Head injury.  CT HEAD WITHOUT CONTRAST  Technique:  Contiguous axial images were obtained from the base of the skull through the vertex without contrast.  Comparison: 06/01/2012, 05/31/2012.  Findings: Large bilateral superficial scalp hematomas.  Skin staples remain in place on the right.  Presumably chronic small metallic retained foreign bodies about the right orbit and left zygoma are stable.  Endotracheal and orogastric tube partially  visible.  Stable paranasal sinus opacification.  The mastoids remain clear. Stable visualized osseous structures.  Nondisplaced anterior right frontal bone fracture is well as multiple bilateral facial fractures were better demonstrated on presentation exam.  Numerous bilateral hemorrhagic contusions, most in the right hemisphere are stable.  The largest anteriorly just above the right orbit.  For the most part these have little associated edema and mass effect.  Scattered bilateral small volume subarachnoid hemorrhage is stable. Trace  intraventricular hemorrhage in the left occipital horn.  No ventriculomegaly.  Patchy right parietal lobe and right occipital lobe infarcts have progressed since yesterday with cytotoxic edema, but little or no associated hemorrhage (series 1 image 14).  Basilar cisterns are patent and stable.  No subdural hemorrhage identified.  IMPRESSION: 1.  Right parietal and occipital lobe patchy infarcts with progression of cytotoxic edema.  Little to no associated hemorrhage with these. 2.  Numerous scattered small hemorrhagic contusions are stable and show little associated edema and mass effect. 3.  Small volume subarachnoid hemorrhage is stable.  Trace intraventricular hemorrhage and no ventriculomegaly. 4.  Extensive facial fractures.  Extensive superficial scalp hematoma.   Original Report Authenticated By: Harley Hallmark, M.D.     Assessment/Plan: CT brain without yesterday shows evidence of infarction in the right parieto-occipital junction, suggestive of a watershed infarct, presumably related to his multiple arrest in the emergency room on the day of presentation. There remains traumatic changes, although the hyperdensity of the right frontal hemorrhagic contusion is diminishing, and the subarachnoid hemorrhage and intraventricular hemorrhage seen on the initial followup CT is clearing.  Slight improvement neurologically, with more spontaneous eye opening. Cause of left hemi-paresis remains uncertain. Will need another followup CT of head, ideally not portable, in a couple days.  Recommend continue supportive care.   Hewitt Shorts, MD 06/05/2012, 8:04 AM

## 2012-06-05 NOTE — Clinical Social Work Note (Signed)
Clinical Social Worker spoke with patient brother over the phone to offer continued support.  Per unit, patient with unusual family dynamics - per report patient living with niece, however patient next of kin is two out of town brothers who have called to change the password and asked for limited visitors.  RN spoke with patient brother who did provide the new password to patient niece.  Patient brother states that he plans to come into town tomorrow and stay through Saturday.  Patient remains on the ventilator and sedated at this time with no family present at bedside.  CSW will remain available for emotional support and to facilitate patient discharge needs once medically ready.  Macario Golds, [place] [phone]

## 2012-06-05 NOTE — Progress Notes (Signed)
Patient ID: Alex Foster, male   DOB: 08-31-1946, 66 y.o.   MRN: [id number]   LOS: 5 days   Subjective: Sedated, on vent.  Objective: Vital signs in last 24 hours: Temp:  [93.4 F (34.1 C)-100.2 F (37.9 C)] 99.8 F (37.7 C) (09/04 0800) Pulse Rate:  [57-105] 91  (09/04 0800) Resp:  [16-37] 23  (09/04 0800) SpO2:  [89 %-100 %] 99 % (09/04 0800) FiO2 (%):  [40 %-100 %] 69.3 % (09/04 0800) Weight:  [120.8 kg (266 lb 5.1 oz)] 120.8 kg (266 lb 5.1 oz) (09/04 0600)    VENT: PRVC/70%/8PEEP/RR24/Tv629ml  CT No air leak 15ml/24h @310ml   UOP: 162ml/h NET: +466ml/24h TOTAL: +15 liters/admission   Lab Results  CBC  Basename 06/05/12 0415 06/04/12 2023  WBC 6.1 6.4  HGB 8.3* 8.5*  HCT 23.5* 24.7*  PLT 117* 114*   BMET  Basename 06/05/12 0415 06/04/12 0420  NA 138 130*  K 3.4* 4.0  CL 105 100  CO2 26 25  GLUCOSE 135* 144*  BUN 19 23  CREATININE 0.95 1.07  CALCIUM 8.2* 7.8*   ABG    Component Value Date/Time   PHART 7.434 06/05/2012 0329   PCO2ART 39.4 06/05/2012 0329   PO2ART 106.0* 06/05/2012 0329   HCO3 26.0* 06/05/2012 0329   TCO2 27.2 06/05/2012 0329   ACIDBASEDEF 1.1 06/04/2012 0349   O2SAT 99.4 06/05/2012 0329    Radiology PORTABLE CHEST - 1 VIEW  Comparison: Portable chest x-ray of 06/04/2012 and CT chest of  05/31/2012  Findings: There has been an increase in bibasilar opacities most  consistent with atelectasis and effusions. The right chest tube is  no longer seen, and the left chest tube remains. No definite  pneumothorax is noted. Multiple bilateral rib fractures are again  noted. The endotracheal tube is unchanged in position as is the  right PICC line, and cardiomegaly is stable.  IMPRESSION:  Increase in bibasilar opacities most consistent with atelectasis  and effusions. No definite pneumothorax. Right chest tube  removed.  Original Report Authenticated By: Juline Patch, M.D.    General appearance: Sedated, on vent, no distress. Resp: wheezes  anterior - right Cardio: Irregular, significant ectopy on monitor. GI: Soft, BS diminished. Pulses: 2+ and symmetric Neuro: E3V1tM6 = 10t. PERRL. No movement left side.   ID Elita Quick D#4  9/1 -- Present Vanc D#4  9/1 -- Present  Resp culture  9/1 -- Beta-lactam neg. H. parainfluenzae   Assessment/Plan: [facility] VDRF -- Will try to wean settings. Decrease RR to 22. TBI w/ICC -- per NS Right CVA -- per NS Multiple facial fxs -- Non-operative per ENT Multiple bilateral rib fxs w/HTPX s/p bilateral CT -- Left CT to water seal. ABL anemia -- Stable Shock -- Now off pressors Cards -- Cardiology following for arrhythmias ID -- Will d/c vanc, continue Fortaz for 10d total. FEN -- Tolerating TF. Increase K+ for mild hypokalemia VTE -- SCD's Dispo -- VDRF   Critical care time: 1005 -- 1045   Freeman Caldron, PA-C Pager: [phone] General Trauma PA Pager: [phone]   06/05/2012

## 2012-06-05 NOTE — Progress Notes (Signed)
The patient is stable at this point.  Will not move his left side.  Continue to try to wean.  May need trach and G-tube in the future if he does not improve neurologically.  This patient has been seen and I agree with the findings and treatment plan.  Marta Lamas. Gae Bon, MD, FACS [phone] (pager) [phone] (direct pager) Trauma Surgeon
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
## 2012-06-06 ENCOUNTER — Inpatient Hospital Stay (HOSPITAL_COMMUNITY): Payer: Medicaid Other

## 2012-06-06 LAB — GLUCOSE, CAPILLARY
Glucose-Capillary: 136 mg/dL — ABNORMAL HIGH (ref 70–99)
Glucose-Capillary: 150 mg/dL — ABNORMAL HIGH (ref 70–99)

## 2012-06-06 LAB — BASIC METABOLIC PANEL
BUN: 25 mg/dL — ABNORMAL HIGH (ref 6–23)
BUN: 29 mg/dL — ABNORMAL HIGH (ref 6–23)
CO2: 27 mEq/L (ref 19–32)
CO2: 27 meq/L (ref 19–32)
Calcium: 8.2 mg/dL — ABNORMAL LOW (ref 8.4–10.5)
Chloride: 104 meq/L (ref 96–112)
Creatinine, Ser: 0.94 mg/dL (ref 0.50–1.35)
Creatinine, Ser: 1.03 mg/dL (ref 0.50–1.35)
Glucose, Bld: 194 mg/dL — ABNORMAL HIGH (ref 70–99)
Glucose, Bld: 195 mg/dL — ABNORMAL HIGH (ref 70–99)

## 2012-06-06 LAB — CBC
MCH: 29.9 pg (ref 26.0–34.0)
MCV: 86.9 fL (ref 78.0–100.0)
Platelets: 131 10*3/uL — ABNORMAL LOW (ref 150–400)
RBC: 2.91 MIL/uL — ABNORMAL LOW (ref 4.22–5.81)
RDW: 14.9 % (ref 11.5–15.5)

## 2012-06-06 MED ORDER — QUETIAPINE FUMARATE 25 MG PO TABS
25.0000 mg | ORAL_TABLET | Freq: Three times a day (TID) | ORAL | Status: DC
  Administered 2012-06-06 – 2012-06-07 (×4): 25 mg
  Filled 2012-06-06 (×7): qty 1

## 2012-06-06 MED ORDER — ACETAMINOPHEN 160 MG/5ML PO SOLN
650.0000 mg | Freq: Four times a day (QID) | ORAL | Status: DC | PRN
  Administered 2012-06-06 – 2012-08-04 (×32): 650 mg
  Filled 2012-06-06 (×37): qty 20.3

## 2012-06-06 MED ORDER — POLYETHYLENE GLYCOL 3350 17 G PO PACK
17.0000 g | PACK | Freq: Every day | ORAL | Status: DC
  Administered 2012-06-06 – 2012-06-26 (×13): 17 g
  Filled 2012-06-06 (×22): qty 1

## 2012-06-06 MED ORDER — CLONAZEPAM 0.1 MG/ML ORAL SUSPENSION: 0.2500 mg | Freq: Three times a day (TID) | ORAL | Status: DC

## 2012-06-06 MED ORDER — GUAIFENESIN 100 MG/5ML PO SOLN
10.0000 mL | Freq: Four times a day (QID) | ORAL | Status: DC
Start: 2012-06-06 — End: 2012-06-18
  Administered 2012-06-06 – 2012-06-17 (×44): 200 mg
  Filled 2012-06-06 (×52): qty 10

## 2012-06-06 MED ORDER — CLONAZEPAM 0.5 MG PO TABS
0.2500 mg | ORAL_TABLET | Freq: Three times a day (TID) | ORAL | Status: DC
  Administered 2012-06-06 – 2012-06-07 (×4): 0.25 mg
  Filled 2012-06-06 (×4): qty 1
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
## 2012-06-06 NOTE — Progress Notes (Signed)
Pt reviewed in hospital LOS meeting today.  

## 2012-06-07 ENCOUNTER — Inpatient Hospital Stay (HOSPITAL_COMMUNITY): Payer: Medicaid Other

## 2012-06-07 LAB — BLOOD GAS, ARTERIAL
Acid-Base Excess: 1.5 mmol/L (ref 0.0–2.0)
Bicarbonate: 26.5 mEq/L — ABNORMAL HIGH (ref 20.0–24.0)
Bicarbonate: 25.5 mEq/L — ABNORMAL HIGH (ref 20.0–24.0)
FIO2: 0.4 %
O2 Saturation: 96.8 %
PEEP: 5 cmH2O
TCO2: 28 mmol/L (ref 0–100)
TCO2: 26.6 mmol/L (ref 0–100)
pH, Arterial: 7.461 — ABNORMAL HIGH (ref 7.350–7.450)
pO2, Arterial: 77.7 mmHg — ABNORMAL LOW (ref 80.0–100.0)
pO2, Arterial: 61.8 mmHg — ABNORMAL LOW (ref 80.0–100.0)

## 2012-06-07 LAB — GLUCOSE, CAPILLARY
Glucose-Capillary: 142 mg/dL — ABNORMAL HIGH (ref 70–99)
Glucose-Capillary: 173 mg/dL — ABNORMAL HIGH (ref 70–99)
Glucose-Capillary: 165 mg/dL — ABNORMAL HIGH (ref 70–99)
Glucose-Capillary: 168 mg/dL — ABNORMAL HIGH (ref 70–99)
Glucose-Capillary: 146 mg/dL — ABNORMAL HIGH (ref 70–99)

## 2012-06-07 LAB — BASIC METABOLIC PANEL
CO2: 28 mEq/L (ref 19–32)
Calcium: 8.3 mg/dL — ABNORMAL LOW (ref 8.4–10.5)
GFR calc Af Amer: >90 mL/min (ref >90)
GFR calc non Af Amer: 88 mL/min — ABNORMAL LOW (ref >90)
Sodium: 140 mEq/L (ref 135–145)

## 2012-06-07 LAB — CBC
MCH: 29.4 pg (ref 26.0–34.0)
MCHC: 32.6 g/dL (ref 30.0–36.0)
Platelets: 145 10*3/uL — ABNORMAL LOW (ref 150–400)
RBC: 3.13 MIL/uL — ABNORMAL LOW (ref 4.22–5.81)

## 2012-06-07 MED ORDER — QUETIAPINE FUMARATE 50 MG PO TABS
50.0000 mg | ORAL_TABLET | Freq: Three times a day (TID) | ORAL | Status: DC
  Administered 2012-06-07 – 2012-06-08 (×3): 50 mg
  Filled 2012-06-07 (×6): qty 1

## 2012-06-07 MED ORDER — FUROSEMIDE 10 MG/ML PO SOLN
20.0000 mg | Freq: Two times a day (BID) | ORAL | Status: DC
  Administered 2012-06-07 – 2012-06-08 (×2): 20 mg via ORAL
  Filled 2012-06-07 (×4): qty 2

## 2012-06-07 MED ORDER — BISACODYL 10 MG RE SUPP
10.0000 mg | Freq: Every day | RECTAL | Status: DC | PRN
  Administered 2012-06-07: 10 mg via RECTAL
  Filled 2012-06-07: qty 1

## 2012-06-07 MED ORDER — PANTOPRAZOLE SODIUM 40 MG PO PACK
40.0000 mg | PACK | Freq: Every day | ORAL | Status: DC
  Administered 2012-06-07 – 2012-08-08 (×62): 40 mg
  Filled 2012-06-07 (×67): qty 20

## 2012-06-07 MED ORDER — CLONAZEPAM 0.5 MG PO TABS
0.5000 mg | ORAL_TABLET | Freq: Three times a day (TID) | ORAL | Status: DC
  Administered 2012-06-07 (×2): 0.5 mg
  Filled 2012-06-07 (×4): qty 1

## 2012-06-07 MED ORDER — DOCUSATE SODIUM 50 MG/5ML PO LIQD
100.0000 mg | Freq: Two times a day (BID) | ORAL | Status: DC
  Administered 2012-06-07 – 2012-06-19 (×19): 100 mg via ORAL
  Filled 2012-06-07 (×28): qty 10

## 2012-06-07 NOTE — Progress Notes (Signed)
Patient ID: Alex Foster, male   DOB: [birth year], 66 y.o.   MRN: [id number]   LOS: 7 days   Subjective: +FC RLE. Sedated, on vent.  Objective: Vital signs in last 24 hours: Temp:  [98.1 F (36.7 C)-101.3 F (38.5 C)] 101.3 F (38.5 C) (09/06 0900) Pulse Rate:  [82-103] 83  (09/06 0849) Resp:  [22-36] 30  (09/06 0849) BP: (92-162)/(40-75) 146/50 mmHg (09/06 0849) SpO2:  [89 %-99 %] 96 % (09/06 0800) FiO2 (%):  [39.6 %-50.6 %] 40 % (09/06 0836) Weight:  [122.8 kg (270 lb 11.6 oz)] 122.8 kg (270 lb 11.6 oz) (09/06 0500)    VENT: PRVC/45%/5PEEP/RR14/Vt417ml ABG was on prior settings of RR22, Vt668ml   CT No air leak 335ml/24h @1000ml   UOP: 126ml/h NET: -227ml/24h TOTAL: +14512ml/admission   Lab Results  CBC  Basename 06/07/12 0500 06/06/12 0447  WBC 10.3 7.0  HGB 9.2* 8.7*  HCT 28.2* 25.3*  PLT 145* 131*   BMET  Basename 06/07/12 0500 06/06/12 0905  NA 140 137  K 4.0 3.7  CL 106 104  CO2 28 27  GLUCOSE 167* 195*  BUN 34* 29*  CREATININE 0.91 1.03  CALCIUM 8.3* 7.9*   CBG CBG (last 3)   Basename 06/07/12 0802 06/07/12 0404 06/06/12 2341  GLUCAP 173* 142* 136*   ABG    Component Value Date/Time   PHART 7.343* 06/07/2012 0500   PCO2ART 50.7* 06/07/2012 0500   PO2ART 77.7* 06/07/2012 0500   HCO3 26.5* 06/07/2012 0500   TCO2 28.0 06/07/2012 0500   ACIDBASEDEF 1.1 06/04/2012 0349   O2SAT 96.8 06/07/2012 0500    Radiology CXR: Pending     General appearance: no distress Resp: clear to auscultation bilaterally Cardio: regular rate and rhythm GI: Soft, +BS Pulses: 2+ and symmetric Neuro: Pupils =, OS reactive, OD NR   ID Fortaz D#5 9/1 -- Present   Resp culture 9/1 -- Beta-lactam neg. H. parainfluenzae   Assessment/Plan: [facility]  VDRF -- ABG much worse this am. Check CXR, increase Vt, recheck ABG in 1h. TBI w/ICC -- per NS. Repeat HCT Monday. Right CVA -- per NS  Multiple facial fxs -- Non-operative per ENT  Multiple bilateral rib fxs w/HTPX s/p  bilateral CT -- Left CT to water seal.  ABL anemia -- Stable  Cards -- Cardiology following for arrhythmias  ID -- Fortaz for 10d total for H parainfluenza PNA FEN -- Will add lasix VTE -- SCD's  Dispo -- VDRF    Critical care time: 0950 -- 1020    Freeman Caldron, PA-C Pager: [phone] General Trauma PA Pager: [phone]   06/07/2012

## 2012-06-07 NOTE — Progress Notes (Signed)
UR complete 

## 2012-06-07 NOTE — Progress Notes (Signed)
Patient seen and examined.  Agree with PA's note.  

## 2012-06-07 NOTE — Clinical Social Work Note (Signed)
Clinical Social Worker continuing to follow patient for emotional support and discharge planning needs.  Patient remains sedated and on the ventilator at this time with no family currently present at bedside.  Per RN report, patient brothers intend to be here from out of town on Saturday 09/07.  Per chart, patient has been to [facility] in the past.  Per MD, patient will most likely require trach placement next week.  CSW will continue to follow for discharge needs and support.  Macario Golds, [place] [phone]

## 2012-06-07 NOTE — Progress Notes (Signed)
Subjective: Continues on ventilator, via endotracheal tube. Continues on fentanyl and Versed drips for sedation. Continues in [place] cervical collar.  Objective: Vital signs in last 24 hours: Filed Vitals:   06/07/12 0510 06/07/12 0600 06/07/12 0630 06/07/12 0700  BP: 122/61 99/47 112/52 107/51  Pulse: 94 97 93 91  Temp: 99.9 F (37.7 C) 98.6 F (37 C) 98.1 F (36.7 C) 100.2 F (37.9 C)  TempSrc:      Resp: 29 29 28 28   Height:      Weight:      SpO2: 92% 91% 92% 93%    Intake/Output from previous day: 09/05 0701 - 09/06 0700 In: 3301 [I.V.:1981; NG/GT:1170; IV Piggyback:150] Out: 3525 [Urine:3145; Chest Tube:380] Intake/Output this shift:    Physical Exam:  None opening eyes to voice or pain, grimaces is to pain. Localizes on right to pain, minimal movement on left to pain. Pupils 4 mm bilaterally, round and reactive to light.  CBC  Basename 06/07/12 0500 06/06/12 0447  WBC 10.3 7.0  HGB 9.2* 8.7*  HCT 28.2* 25.3*  PLT 145* 131*   BMET  Basename 06/07/12 0500 06/06/12 0905  NA 140 137  K 4.0 3.7  CL 106 104  CO2 28 27  GLUCOSE 167* 195*  BUN 34* 29*  CREATININE 0.91 1.03  CALCIUM 8.3* 7.9*   ABG    Component Value Date/Time   PHART 7.343* 06/07/2012 0500   PCO2ART 50.7* 06/07/2012 0500   PO2ART 77.7* 06/07/2012 0500   HCO3 26.5* 06/07/2012 0500   TCO2 28.0 06/07/2012 0500   ACIDBASEDEF 1.1 06/04/2012 0349   O2SAT 96.8 06/07/2012 0500    Studies/Results: Dg Chest Port 1 View  06/06/2012  *RADIOLOGY REPORT*  Clinical Data: Bilateral pneumothoraces.  Rib fractures.  Intubated patient.  PORTABLE CHEST - 1 VIEW  Comparison: Chest 06/05/2012.  Findings: Left chest tube remains in place.  No pneumothorax is identified.  There is cardiomegaly.  Bibasilar airspace disease persists without change.  Multiple right rib fractures are again identified.  IMPRESSION:  1.  Negative for pneumothorax the left chest tube in place. 2.  No change in bibasilar airspace disease.   Original  Report Authenticated By: Bernadene Bell. Maricela Curet, M.D.     Assessment/Plan: We'll check CT brain without on Monday, September 9. Continue current supportive care.   Hewitt Shorts, MD 06/07/2012, 7:42 AM

## 2012-06-08 ENCOUNTER — Inpatient Hospital Stay (HOSPITAL_COMMUNITY): Payer: Medicaid Other

## 2012-06-08 LAB — BLOOD GAS, ARTERIAL
Bicarbonate: 26.5 mEq/L — ABNORMAL HIGH (ref 20.0–24.0)
PEEP: 5 cmH2O
Patient temperature: 98.6
TCO2: 27.6 mmol/L (ref 0–100)
pCO2 arterial: 37.2 mmHg (ref 35.0–45.0)
pH, Arterial: 7.466 — ABNORMAL HIGH (ref 7.350–7.450)
pO2, Arterial: 63.3 mmHg — ABNORMAL LOW (ref 80.0–100.0)

## 2012-06-08 LAB — GLUCOSE, CAPILLARY
Glucose-Capillary: 165 mg/dL — ABNORMAL HIGH (ref 70–99)
Glucose-Capillary: 168 mg/dL — ABNORMAL HIGH (ref 70–99)
Glucose-Capillary: 169 mg/dL — ABNORMAL HIGH (ref 70–99)

## 2012-06-08 MED ORDER — QUETIAPINE FUMARATE 100 MG PO TABS
100.0000 mg | ORAL_TABLET | Freq: Three times a day (TID) | ORAL | Status: DC
  Administered 2012-06-08 – 2012-06-13 (×15): 100 mg
  Filled 2012-06-08 (×18): qty 1

## 2012-06-08 MED ORDER — FUROSEMIDE 8 MG/ML PO SOLN
40.0000 mg | Freq: Two times a day (BID) | ORAL | Status: DC
  Administered 2012-06-08 – 2012-06-09 (×2): 40 mg via ORAL
  Filled 2012-06-08 (×4): qty 4

## 2012-06-08 MED ORDER — CLONAZEPAM 1 MG PO TABS
1.0000 mg | ORAL_TABLET | Freq: Three times a day (TID) | ORAL | Status: DC
  Administered 2012-06-08 – 2012-06-13 (×14): 1 mg
  Filled 2012-06-08 (×14): qty 1

## 2012-06-08 MED ORDER — ALBUTEROL SULFATE (5 MG/ML) 0.5% IN NEBU
2.5000 mg | INHALATION_SOLUTION | RESPIRATORY_TRACT | Status: DC
  Administered 2012-06-08 – 2012-06-18 (×58): 2.5 mg via RESPIRATORY_TRACT
  Filled 2012-06-08 (×58): qty 0.5

## 2012-06-08 MED ORDER — INSULIN GLARGINE 100 UNIT/ML ~~LOC~~ SOLN
25.0000 [IU] | Freq: Every day | SUBCUTANEOUS | Status: DC
  Administered 2012-06-09 – 2012-06-17 (×9): 25 [IU] via SUBCUTANEOUS

## 2012-06-08 MED ORDER — IPRATROPIUM BROMIDE 0.02 % IN SOLN
0.5000 mg | RESPIRATORY_TRACT | Status: DC
  Administered 2012-06-08 – 2012-06-18 (×58): 0.5 mg via RESPIRATORY_TRACT
  Filled 2012-06-08 (×58): qty 2.5

## 2012-06-08 NOTE — Progress Notes (Signed)
Patient ID: Alex Foster, male   DOB: 10-21-1945, 66 y.o.   MRN: [id number]   LOS: 8 days   Subjective: Sedated, on vent.  Objective: Vital signs in last 24 hours: Temp:  [96.4 F (35.8 C)-100.2 F (37.9 C)] 99.9 F (37.7 C) (09/07 0700) Pulse Rate:  [79-93] 93  (09/07 0818) Resp:  [19-27] 22  (09/07 0818) BP: (92-162)/(45-83) 162/55 mmHg (09/07 0818) SpO2:  [90 %-97 %] 90 % (09/07 0700) FiO2 (%):  [44.7 %-45.3 %] 45 % (09/07 0818) Weight:  [121.6 kg (268 lb 1.3 oz)] 121.6 kg (268 lb 1.3 oz) (09/07 0600) Last BM Date: 06/07/12  VENT: PRVC/45%/5PEEP/RR22/Vt676ml  CT No air leak 269ml/24h   UOP: 128ml/h NET: -157ml/24h TOTAL: +14449ml/admission   Lab Results  CBC  Basename 06/07/12 0500 06/06/12 0447  WBC 10.3 7.0  HGB 9.2* 8.7*  HCT 28.2* 25.3*  PLT 145* 131*   BMET  Basename 06/07/12 0500 06/06/12 0905  NA 140 137  K 4.0 3.7  CL 106 104  CO2 28 27  GLUCOSE 167* 195*  BUN 34* 29*  CREATININE 0.91 1.03  CALCIUM 8.3* 7.9*   CBG CBG (last 3)   Basename 06/08/12 0427 06/08/12 0009 06/07/12 1944  GLUCAP 125* 165* 146*    ABG    Component Value Date/Time   PHART 7.466* 06/08/2012 0400   PCO2ART 37.2 06/08/2012 0400   PO2ART 63.3* 06/08/2012 0400   HCO3 26.5* 06/08/2012 0400   TCO2 27.6 06/08/2012 0400   ACIDBASEDEF 1.1 06/04/2012 0349   O2SAT 95.9 06/08/2012 0400      Radiology PORTABLE CHEST - 1 VIEW  Comparison: 06/07/2012; 06/06/2012; 06/05/2012; chest CT -  05/31/2012  Findings: Grossly unchanged enlarged cardiac silhouette and  mediastinal contours. Stable positioning of support apparatus. No  definite pneumothorax. Unchanged small right-sided pleural effusion  and right mid/lower lung heterogeneous opacities. Improved  aeration of the left mid lung. Shrapnel again overlies the mid and  left lateral chest. Multiple displaced right-sided rib fractures  redemonstrated.  IMPRESSION:  1. Stable positioning of support apparatus. No pneumothorax.  2.  Unchanged small right-sided effusion and right mid and lower  lung opacities, atelectasis versus contusion.  Original Report Authenticated By: Waynard Reeds, M.D.    General appearance: mild distress and +FC Resp: rhonchi RUL Cardio: regularly irregular rhythm GI: normal findings: bowel sounds normal and soft Extremities: edema diffusely Pulses: 2+ and symmetric   ID Elita Quick D#7 9/1 -- Present   Resp culture 9/1 -- Beta-lactam neg. H. parainfluenzae   Assessment/Plan: MCC  VDRF -- Stable but not weaning especially well. Will plan to trach Monday. TBI w/ICC -- per NS. Repeat HCT Monday.  CVA -- per NS  Multiple facial fxs -- Non-operative per ENT  Multiple bilateral rib fxs w/HTPX s/p bilateral CT -- Add albuterol/atrovent ABL anemia -- Stable  Cards -- Cardiology following for arrhythmias  ID -- Fortaz for 10d total for H parainfluenza PNA  FEN -- Will increase lasix, increase Lantus as CBG's have been a bit high. Increase Klonopin and seroquel. PEG with trach on Monday. VTE -- SCD's  Dispo -- VDRF. Updated brothers.  Critical care time: 1140 -- 1225   Freeman Caldron, PA-C Pager: [phone] General Trauma PA Pager: [phone]   06/08/2012

## 2012-06-08 NOTE — Consult Note (Signed)
Sedated, on respirator. Continue as per trauma. Ct head on 06/10/12

## 2012-06-08 NOTE — ED Provider Notes (Signed)
I saw and evaluated the patient, reviewed the resident's note and I agree with the findings and plan.   .Face to face Exam:  General:  Decreased LOC Resp:  labored Abd:  Nondistended Neuro:No focal weakness Lymph: No adenopathy   CRITICAL CARE Performed by: Nelva Nay L   Total critical care time: 45 min  Critical care time was exclusive of separately billable procedures and treating other patients.  Critical care was necessary to treat or prevent imminent or life-threatening deterioration.  Critical care was time spent personally by me on the following activities: development of treatment plan with patient and/or surrogate as well as nursing, discussions with consultants, evaluation of patient's response to treatment, examination of patient, obtaining history from patient or surrogate, ordering and performing treatments and interventions, ordering and review of laboratory studies, ordering and review of radiographic studies, pulse oximetry and re-evaluation of patient's condition.   Nelia Shi, MD 06/08/12 [phone]

## 2012-06-09 ENCOUNTER — Inpatient Hospital Stay (HOSPITAL_COMMUNITY): Payer: Medicaid Other

## 2012-06-09 DIAGNOSIS — J90 Pleural effusion, not elsewhere classified: Secondary | ICD-10-CM

## 2012-06-09 HISTORY — DX: Pleural effusion, not elsewhere classified: J90

## 2012-06-09 LAB — BLOOD GAS, ARTERIAL
Bicarbonate: 28.5 mEq/L — ABNORMAL HIGH (ref 20.0–24.0)
FIO2: 0.45 %
MECHVT: 650 mL
O2 Saturation: 85.8 %
O2 Saturation: 94.9 %
PEEP: 5 cmH2O
PEEP: 8 cmH2O
Patient temperature: 100.2
RATE: 16 resp/min
pCO2 arterial: 34 mmHg — ABNORMAL LOW (ref 35.0–45.0)
pH, Arterial: 7.466 — ABNORMAL HIGH (ref 7.350–7.450)
pO2, Arterial: 44.5 mmHg — ABNORMAL LOW (ref 80.0–100.0)

## 2012-06-09 LAB — GLUCOSE, CAPILLARY
Glucose-Capillary: 129 mg/dL — ABNORMAL HIGH (ref 70–99)
Glucose-Capillary: 162 mg/dL — ABNORMAL HIGH (ref 70–99)

## 2012-06-09 LAB — CBC
HCT: 26.5 % — ABNORMAL LOW (ref 39.0–52.0)
MCHC: 31.7 g/dL (ref 30.0–36.0)
RDW: 15.5 % (ref 11.5–15.5)

## 2012-06-09 LAB — BASIC METABOLIC PANEL
BUN: 48 mg/dL — ABNORMAL HIGH (ref 6–23)
Creatinine, Ser: 1.04 mg/dL (ref 0.50–1.35)
GFR calc Af Amer: 86 mL/min — ABNORMAL LOW (ref >90)
GFR calc non Af Amer: 74 mL/min — ABNORMAL LOW (ref >90)

## 2012-06-09 LAB — TRIGLYCERIDES: Triglycerides: 104 mg/dL (ref <150)

## 2012-06-09 NOTE — Progress Notes (Signed)
Appreciate CCM assistance with management of hypoxia.  PEG/ Andris Baumann. [name], MD, [facility] Surgery  06/09/2012 3:50 PM
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
## 2012-06-09 NOTE — Progress Notes (Signed)
Vent changes made by NP. RT will monitor.

## 2012-06-10 ENCOUNTER — Inpatient Hospital Stay (HOSPITAL_COMMUNITY): Payer: Medicaid Other

## 2012-06-10 ENCOUNTER — Encounter (HOSPITAL_COMMUNITY): Admission: EM | Disposition: A | Discharge status: Rehabilitation Facility | Payer: Self-pay | Source: Home / Self Care

## 2012-06-10 DIAGNOSIS — S2249XA Multiple fractures of ribs, unspecified side, initial encounter for closed fracture: Secondary | ICD-10-CM

## 2012-06-10 LAB — CBC
MCH: 28.9 pg (ref 26.0–34.0)
MCHC: 30.8 g/dL (ref 30.0–36.0)
MCV: 93.9 fL (ref 78.0–100.0)
Platelets: 194 10*3/uL (ref 150–400)

## 2012-06-10 LAB — BASIC METABOLIC PANEL
CO2: 32 mEq/L (ref 19–32)
Calcium: 8.4 mg/dL (ref 8.4–10.5)
GFR calc non Af Amer: 69 mL/min — ABNORMAL LOW (ref >90)
Glucose, Bld: 147 mg/dL — ABNORMAL HIGH (ref 70–99)
Potassium: 3.9 mEq/L (ref 3.5–5.1)
Sodium: 144 mEq/L (ref 135–145)

## 2012-06-10 LAB — GLUCOSE, CAPILLARY
Glucose-Capillary: 148 mg/dL — ABNORMAL HIGH (ref 70–99)
Glucose-Capillary: 152 mg/dL — ABNORMAL HIGH (ref 70–99)
Glucose-Capillary: 167 mg/dL — ABNORMAL HIGH (ref 70–99)

## 2012-06-10 LAB — BLOOD GAS, ARTERIAL
Acid-Base Excess: 5.9 mmol/L — ABNORMAL HIGH (ref 0.0–2.0)
Drawn by: 13898
FIO2: 0.5 %
TCO2: 31.7 mmol/L (ref 0–100)
pCO2 arterial: 47 mmHg — ABNORMAL HIGH (ref 35.0–45.0)
pH, Arterial: 7.424 (ref 7.350–7.450)
pO2, Arterial: 66.6 mmHg — ABNORMAL LOW (ref 80.0–100.0)

## 2012-06-10 SURGERY — CREATION, TRACHEOSTOMY, PERCUTANEOUS
Anesthesia: General

## 2012-06-10 MED ORDER — VECURONIUM BROMIDE 10 MG IV SOLR: 20.0000 mg | Freq: Once | INTRAVENOUS | Status: DC

## 2012-06-10 NOTE — Progress Notes (Signed)
I spoke to the patient's niece and her husband.  Appreciate CCM assist.  Hopefully pulmonary function will improve.  Try condom cath. Patient examined and I agree with the assessment and plan  Violeta Gelinas, MD, MPH, FACS Pager: [phone]  06/10/2012 1:07 PM
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
## 2012-06-10 NOTE — Progress Notes (Signed)
Patient ID: Alex Foster, male   DOB: 24-Sep-1946, 66 y.o.   MRN: [id number]   LOS: 10 days   Subjective: Sedated, on vent. [place] FC.  Objective: Vital signs in last 24 hours: Temp:  [96.1 F (35.6 C)-100.4 F (38 C)] 98.6 F (37 C) (09/09 0727) Pulse Rate:  [78-102] 96  (09/09 0727) Resp:  [10-24] 18  (09/09 0727) BP: (93-180)/(40-74) 100/57 mmHg (09/09 0700) SpO2:  [92 %-98 %] 95 % (09/09 0733) FiO2 (%):  [49.4 %-60 %] 50 % (09/09 0733) Weight:  [114.7 kg (252 lb 13.9 oz)] 114.7 kg (252 lb 13.9 oz) (09/09 0500) Last BM Date: 06/07/12  VENT: PRVC/50%/8PEEP/RR18/Vt672ml  CT  No air leak  174ml/24h  UOP: 117ml/h NET: -2311ml/24h TOTAL: +7710ml/admission   Lab Results  CBC  Basename 06/10/12 0330 06/09/12 0355  WBC 6.2 6.6  HGB 8.5* 8.4*  HCT 27.6* 26.5*  PLT 194 168   BMET  Basename 06/10/12 0330 06/09/12 0355  NA 144 144  K 3.9 3.8  CL 106 109  CO2 32 29  GLUCOSE 147* 165*  BUN 58* 48*  CREATININE 1.10 1.04  CALCIUM 8.4 8.2*    CBG (last 3)   Basename 06/10/12 0346 06/10/12 0012 06/09/12 2001  GLUCAP 154* 152* 175*   ABG    Component Value Date/Time   PHART 7.424 06/10/2012 0500   PCO2ART 47.0* 06/10/2012 0500   PO2ART 66.6* 06/10/2012 0500   HCO3 30.2* 06/10/2012 0500   TCO2 31.7 06/10/2012 0500   ACIDBASEDEF 1.1 06/04/2012 0349   O2SAT 92.4 06/10/2012 0500      Radiology CXR: RLL ASD (official read pending)  CT HEAD WITHOUT CONTRAST  Technique: Contiguous axial images were obtained from the base of  the skull through the vertex without contrast.  Comparison: 06/04/2012 and multiple previous  Findings: Small amount of subarachnoid hemorrhage previously seen  within the sulci has diminished, now virtually inapparent.  Numerous small hemorrhagic shear injuries at the convexities, much  more extensive on the right, show diminishing hemorrhagic density.  Hemorrhagic contusion in the right frontal lobe remains evident  without evidence of increased  hemorrhage. Hematoma measures 2.2 cm  again. Mild surrounding vasogenic edema. There is a low density  in the right hemisphere related to infarction and or shear injury  appear the same. No mass effect. No subdural hematoma. No new  finding. No hydrocephalus.  IMPRESSION:  No new or progressive finding. Diminishing density of subarachnoid  hemorrhage and hemorrhagic contusions. Largest contusion in the  right frontal lobe measures 2.2 cm, not enlarged. Areas of edema  related to shear injury and/or infarction appear the same. No new  findings or shift.  Original Report Authenticated By: Thomasenia Sales, M.D.   General appearance: alert Resp: rhonchi RUL Cardio: Frequent ectopy GI: Soft, +BS. Extremities: Warm   ID Elita Quick D#9 9/1 -- Present   Resp culture 9/1 -- Beta-lactam neg. H. parainfluenzae   Assessment/Plan: MCC  VDRF -- Appreciate CCM's help TBI w/ICC -- per NS. HCT stable, NS wants MR of head and C-spine. CVA -- per NS  Multiple facial fxs -- Non-operative per ENT  Multiple bilateral rib fxs w/HTPX s/p bilateral CT -- Continue left CT ABL anemia -- Stable  Cards -- Cardiology following for arrhythmias  ID -- Fortaz for 10d total for H parainfluenza PNA  FEN -- Watch renal fxn VTE -- SCD's  Dispo -- VDRF.     Freeman Caldron, PA-C Pager: [phone] General Trauma PA Pager: [phone]  06/10/2012      
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
## 2012-06-10 NOTE — Progress Notes (Signed)
UR completed 

## 2012-06-10 NOTE — Progress Notes (Signed)
RT called to room due to pt making noises around ETT. ETT was 1-2 cm out from what was previously documented. RT advanced ETT back to 23 at the lip. Good bilateral breath sounds, expiratory minute ventilation correlating with inspiratory minute ventilation. End Tidal had good color change. Pt vitals stable.

## 2012-06-11 ENCOUNTER — Inpatient Hospital Stay (HOSPITAL_COMMUNITY): Payer: Medicaid Other

## 2012-06-11 LAB — BASIC METABOLIC PANEL
CO2: 32 mEq/L (ref 19–32)
Calcium: 8.5 mg/dL (ref 8.4–10.5)
Creatinine, Ser: 1.08 mg/dL (ref 0.50–1.35)
Glucose, Bld: 151 mg/dL — ABNORMAL HIGH (ref 70–99)

## 2012-06-11 LAB — GLUCOSE, CAPILLARY

## 2012-06-11 MED ORDER — VECURONIUM BROMIDE 10 MG IV SOLR
INTRAVENOUS | Status: AC
  Filled 2012-06-11: qty 20

## 2012-06-11 MED ORDER — VECURONIUM BROMIDE 10 MG IV SOLR: 20.0000 mg | Freq: Once | INTRAVENOUS | Status: DC
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
## 2012-06-11 NOTE — Progress Notes (Signed)
Subjective: Patient continues on ventilator via endotracheal tube. Case discussed with Charma Igo PA from trauma surgical service, he's going to make arrangements for MRI of brain and cervical spine.  Objective: Vital signs in last 24 hours: Filed Vitals:   06/11/12 0400 06/11/12 0500 06/11/12 0600 06/11/12 0700  BP: 132/68 138/70 119/49 109/52  Pulse: 104 110 101 94  Temp: 100.4 F (38 C) 100.9 F (38.3 C)  100.6 F (38.1 C)  TempSrc:      Resp: 21 19 18 17   Height:      Weight:  112.7 kg (248 lb 7.3 oz)    SpO2: 92% 92% 93% 92%    Intake/Output from previous day: 09/09 0701 - 09/10 0700 In: 2485.7 [I.V.:1575.7; NG/GT:360; IV Piggyback:550] Out: 3750 [Urine:3700; Chest Tube:50] Intake/Output this shift:    Physical Exam:  Opening eyes spontaneously. Pupils 2.5 mm bilaterally, round, reactive to light. Following commands on right with both upper and lower extremities. Little movement on left side.  CBC  Basename 06/10/12 0330 06/09/12 0355  WBC 6.2 6.6  HGB 8.5* 8.4*  HCT 27.6* 26.5*  PLT 194 168   BMET  Basename 06/11/12 0500 06/10/12 0330  NA 149* 144  K 4.1 3.9  CL 111 106  CO2 32 32  GLUCOSE 151* 147*  BUN 57* 58*  CREATININE 1.08 1.10  CALCIUM 8.5 8.4    Assessment/Plan: Neurologically stable. Awaiting MRIs of brain and cervical spine. Continue current supportive care.   Hewitt Shorts, MD 06/11/2012, 7:32 AM

## 2012-06-11 NOTE — Progress Notes (Signed)
Subjective: Down for MRI - may be suboptimal because of difficulty holding still.  The MRI compatible IV pump is broken, so only one drip may be run at a time.??  We will see if we get adequate results.  Objective: Vital signs in last 24 hours: Temp:  [97 F (36.1 C)-100.9 F (38.3 C)] 99.9 F (37.7 C) (09/10 1200) Pulse Rate:  [89-110] 89  (09/10 1300) Resp:  [15-24] 21  (09/10 1300) BP: (101-155)/(44-72) 115/56 mmHg (09/10 1300) SpO2:  [92 %-96 %] 94 % (09/10 1300) FiO2 (%):  [49.2 %-50 %] 50 % (09/10 1200) Weight:  [248 lb 7.3 oz (112.7 kg)] 248 lb 7.3 oz (112.7 kg) (09/10 0500) Last BM Date: 06/07/12  Intake/Output from previous day: 09/09 0701 - 09/10 0700 In: 2485.7 [I.V.:1575.7; NG/GT:360; IV Piggyback:550] Out: 3750 [Urine:3700; Chest Tube:50] Intake/Output this shift: Total I/O In: 887.8 [I.V.:367.8; Other:400; NG/GT:120] Out: 435 [Urine:435]  no change  Lab Results:   Basename 06/10/12 0330 06/09/12 0355  WBC 6.2 6.6  HGB 8.5* 8.4*  HCT 27.6* 26.5*  PLT 194 168   BMET  Basename 06/11/12 0500 06/10/12 0330  NA 149* 144  K 4.1 3.9  CL 111 106  CO2 32 32  GLUCOSE 151* 147*  BUN 57* 58*  CREATININE 1.08 1.10  CALCIUM 8.5 8.4   PT/INR No results found for this basename: LABPROT:2,INR:2 in the last 72 hours ABG  Basename 06/10/12 0500 06/09/12 1455  PHART 7.424 7.466*  HCO3 30.2* 29.9*    Studies/Results: Ct Head Wo Contrast  06/10/2012  *RADIOLOGY REPORT*  Clinical Data: Follow-up intracranial hemorrhage.  CT HEAD WITHOUT CONTRAST  Technique:  Contiguous axial images were obtained from the base of the skull through the vertex without contrast.  Comparison: 06/04/2012 and multiple previous  Findings: Small amount of subarachnoid hemorrhage previously seen within the sulci has diminished, now virtually inapparent. Numerous small hemorrhagic shear injuries at the convexities, much more extensive on the right, show diminishing hemorrhagic density.  Hemorrhagic contusion in the right frontal lobe remains evident without evidence of increased hemorrhage.  Hematoma measures 2.2 cm again.  Mild surrounding vasogenic edema.  There is a low density in the right hemisphere related to infarction and or shear injury appear the same.  No mass effect.  No subdural hematoma.  No new finding.  No hydrocephalus.  IMPRESSION: No new or progressive finding.  Diminishing density of subarachnoid hemorrhage and hemorrhagic contusions.  Largest contusion in the right frontal lobe measures 2.2 cm, not enlarged.  Areas of edema related to shear injury and/or infarction appear the same.  No new findings or shift.   Original Report Authenticated By: Thomasenia Sales, M.D.    Dg Chest Port 1 View  06/11/2012  *RADIOLOGY REPORT*  Clinical Data: Check endotracheal tube  PORTABLE CHEST - 1 VIEW  Comparison: 06/10/2012  Findings: Endotracheal tube in place with tip 4.2 cm above the carina.  NG tube is unchanged in position.  Stable left chest tube position.  Persistent right pleural effusion with right basilar atelectasis or infiltrate.  Numerous right rib fractures are unchanged from prior exam.  No diagnostic pneumothorax.  Stable right PICC line position with tip in SVC.  Persistent left basilar atelectasis.  The right costophrenic angle is not included on the film.  IMPRESSION: NG tube is unchanged in position.  Stable left chest tube position. Persistent right pleural effusion with right basilar atelectasis or infiltrate.  Numerous right rib fractures are unchanged from prior exam.  No diagnostic pneumothorax.  Stable right PICC line position with tip in SVC.  Persistent left basilar atelectasis.   Original Report Authenticated By: Natasha Mead, M.D.    Dg Chest Port 1 View  06/10/2012  *RADIOLOGY REPORT*  Clinical Data: Endotracheal tube placement.  PORTABLE CHEST - 1 VIEW  Comparison: 1 day prior  Findings: Endotracheal tube is unchanged in position.  Nasogastric extends beyond the   inferior aspect of the film.  Right-sided PICC line terminates at the mid to low SVC.  Left-sided chest tube is unchanged. No pneumothorax.  Decreased to resolved layering right-sided pleural fluid.  No definite left- sided pleural effusion.  Improved right and similar left base air space disease.  Numerous right-sided rib fractures are again identified.  IMPRESSION:  1.  Improved right basilar and similar left basilar airspace disease. 2.  Improved to resolved layering right pleural effusion. 3.  Left-sided chest tube remaining in place, without pneumothorax.   Original Report Authenticated By: Consuello Bossier, M.D.     Anti-infectives: Anti-infectives     Start     Dose/Rate Route Frequency Ordered Stop   06/04/12 1000   vancomycin (VANCOCIN) IVPB 1000 mg/200 mL premix  Status:  Discontinued        1,000 mg 200 mL/hr over 60 Minutes Intravenous Every 12 hours 06/04/12 0837 06/05/12 1045   06/03/12 1800   vancomycin (VANCOCIN) 1,500 mg in sodium chloride 0.9 % 500 mL IVPB  Status:  Discontinued        1,500 mg 250 mL/hr over 120 Minutes Intravenous Every 24 hours 06/02/12 1428 06/04/12 0837   06/02/12 1800   vancomycin (VANCOCIN) 2,000 mg in sodium chloride 0.9 % 500 mL IVPB        2,000 mg 250 mL/hr over 120 Minutes Intravenous  Once 06/02/12 1428 06/02/12 2024   06/02/12 1400   cefTAZidime (FORTAZ) 1 g in dextrose 5 % 50 mL IVPB        1 g 100 mL/hr over 30 Minutes Intravenous 3 times per day 06/02/12 1349 06/12/12 1359          Assessment/Plan: s/p Procedure(s) (LRB) with comments: PERCUTANEOUS TRACHEOSTOMY (N/A) PERCUTANEOUS ENDOSCOPIC GASTROSTOMY (PEG) PLACEMENT (N/A) Assessment/Plan:  MCC  VDRF -- Appreciate CCM's help  TBI w/ICC -- per NS. HCT stable, NS wants MR of head and C-spine.  CVA -- per NS  Multiple facial fxs -- Non-operative per ENT  Multiple bilateral rib fxs w/HTPX s/p bilateral CT -- Continue left CT  ABL anemia -- Stable  Cards -- Cardiology following for  arrhythmias  ID -- Fortaz for 10d total for H parainfluenza PNA  FEN -- Watch renal fxn  VTE -- SCD's  Dispo -- VDRF.      LOS: 11 days    Jaquaveon Bilal K. 06/11/2012
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
## 2012-06-11 NOTE — Clinical Social Work Note (Signed)
Clinical Social Worker continuing to follow for emotional support and discharge planning needs.  Patient remains intubated and sedated and not following commands with no family currently present at bedside.  Per MD, plan is for trach/PEG later in the week.  CSW will follow up with patient family to further discuss patient plans at discharge.  CSW remains available for support as needed.  Macario Golds, [place] [phone]

## 2012-06-12 ENCOUNTER — Inpatient Hospital Stay (HOSPITAL_COMMUNITY): Payer: Medicaid Other

## 2012-06-12 LAB — BASIC METABOLIC PANEL
BUN: 57 mg/dL — ABNORMAL HIGH (ref 6–23)
Chloride: 111 mEq/L (ref 96–112)
GFR calc Af Amer: 86 mL/min — ABNORMAL LOW (ref >90)
Potassium: 4.2 mEq/L (ref 3.5–5.1)

## 2012-06-12 LAB — GLUCOSE, CAPILLARY
Glucose-Capillary: 169 mg/dL — ABNORMAL HIGH (ref 70–99)
Glucose-Capillary: 106 mg/dL — ABNORMAL HIGH (ref 70–99)
Glucose-Capillary: 146 mg/dL — ABNORMAL HIGH (ref 70–99)
Glucose-Capillary: 190 mg/dL — ABNORMAL HIGH (ref 70–99)

## 2012-06-12 LAB — CBC
HCT: 30.4 % — ABNORMAL LOW (ref 39.0–52.0)
Hemoglobin: 9.1 g/dL — ABNORMAL LOW (ref 13.0–17.0)
MCHC: 29.9 g/dL — ABNORMAL LOW (ref 30.0–36.0)

## 2012-06-12 LAB — TRIGLYCERIDES: Triglycerides: 102 mg/dL (ref <150)

## 2012-06-12 NOTE — Progress Notes (Signed)
Agree with above Free water for increased BUN Con't aggressive pulm toilet
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
## 2012-06-12 NOTE — Progress Notes (Signed)
Unit secretary suggested chaplain see pt. I introduced myself to pt's sitter, then stood at pt's bedside. Pt's eyes were open. I spoke a few words of support to him and then offered a prayer.
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
## 2012-06-12 NOTE — Progress Notes (Signed)
The patient had three large watery bowel movements since 1900. Notified Dr. Donell Beers; C. Diff ordered and flexiseal. Will continue to monitor.

## 2012-06-13 ENCOUNTER — Inpatient Hospital Stay (HOSPITAL_COMMUNITY): Payer: Medicaid Other

## 2012-06-13 DIAGNOSIS — S069X9A Unspecified intracranial injury with loss of consciousness of unspecified duration, initial encounter: Secondary | ICD-10-CM

## 2012-06-13 LAB — GLUCOSE, CAPILLARY
Glucose-Capillary: 192 mg/dL — ABNORMAL HIGH (ref 70–99)
Glucose-Capillary: 166 mg/dL — ABNORMAL HIGH (ref 70–99)

## 2012-06-13 LAB — MAGNESIUM: Magnesium: 2.7 mg/dL — ABNORMAL HIGH (ref 1.5–2.5)

## 2012-06-13 LAB — BASIC METABOLIC PANEL
Chloride: 114 mEq/L — ABNORMAL HIGH (ref 96–112)
GFR calc Af Amer: 83 mL/min — ABNORMAL LOW (ref >90)
Potassium: 4.3 mEq/L (ref 3.5–5.1)
Sodium: 151 mEq/L — ABNORMAL HIGH (ref 135–145)

## 2012-06-13 LAB — PHOSPHORUS: Phosphorus: 3.3 mg/dL (ref 2.3–4.6)

## 2012-06-13 MED ORDER — DEXMEDETOMIDINE HCL IN NACL 200 MCG/50ML IV SOLN
0.4000 ug/kg/h | INTRAVENOUS | Status: DC
  Administered 2012-06-13: 0.3 ug/kg/h via INTRAVENOUS
  Administered 2012-06-13: 0.4 ug/kg/h via INTRAVENOUS
  Administered 2012-06-14: 0.3 ug/kg/h via INTRAVENOUS
  Administered 2012-06-14 – 2012-06-15 (×2): 0.4 ug/kg/h via INTRAVENOUS
  Administered 2012-06-15 (×3): 0.5 ug/kg/h via INTRAVENOUS
  Administered 2012-06-15: 0.4 ug/kg/h via INTRAVENOUS
  Administered 2012-06-15: 0.5 ug/kg/h via INTRAVENOUS
  Filled 2012-06-13 (×15): qty 50

## 2012-06-13 MED ORDER — CLONAZEPAM 1 MG PO TABS
2.0000 mg | ORAL_TABLET | Freq: Two times a day (BID) | ORAL | Status: DC
  Administered 2012-06-13 – 2012-06-15 (×4): 2 mg
  Filled 2012-06-13: qty 2
  Filled 2012-06-13: qty 1
  Filled 2012-06-13 (×2): qty 2
  Filled 2012-06-13: qty 1

## 2012-06-13 MED ORDER — FREE WATER
200.0000 mL | Freq: Four times a day (QID) | Status: DC
  Administered 2012-06-13 – 2012-06-14 (×4): 200 mL

## 2012-06-13 MED ORDER — QUETIAPINE FUMARATE 200 MG PO TABS
200.0000 mg | ORAL_TABLET | Freq: Three times a day (TID) | ORAL | Status: DC
  Administered 2012-06-13 – 2012-06-15 (×8): 200 mg
  Filled 2012-06-13 (×9): qty 1

## 2012-06-13 MED ORDER — CLONAZEPAM 1 MG PO TABS: 1.0000 mg | ORAL_TABLET | Freq: Two times a day (BID) | ORAL | Status: DC

## 2012-06-13 NOTE — Progress Notes (Signed)
Nutrition Follow-up  Intervention:  1. Will Continue current TF of Pivot 1.5 @ 20 ml/hr via OGT (goal rate). 60 ml Prostat 5 times per day. At goal rate, tube feeding regimen provides 1720 kcal (71% of estimated needs), 195 grams of protein (>100% of minimum needs), and 364 ml of H2O.  2. RD to follow for nutrition plan of care.    Assessment:   Patient was weaning this morning but unable to extubated. Patient remains intubated with TF running at goal of 20 ml/hr.   Diet Order:  Pivot 1.5 @ 20 ml/hr plus prostat 5 times daily.   Meds: Scheduled Meds:   . albuterol  2.5 mg Nebulization Q4H  . antiseptic oral rinse  15 mL Mouth Rinse Q2H  . chlorhexidine  15 mL Mouth/Throat BID  . clonazePAM  2 mg Per Tube Q12H  . docusate  100 mg Oral BID  . feeding supplement (PIVOT 1.5 CAL)  1,000 mL Per Tube Q24H  . feeding supplement  60 mL Per Tube 5 X Daily  . free water  200 mL Per Tube Q6H  . guaiFENesin  10 mL Per Tube Q6H  . insulin aspart  0-15 Units Subcutaneous Q4H  . insulin glargine  25 Units Subcutaneous Daily  . ipratropium  0.5 mg Nebulization Q4H  . multivitamin  5 mL Per Tube Daily  . pantoprazole sodium  40 mg Per Tube Q1200  . polyethylene glycol  17 g Per Tube Daily  . QUEtiapine  200 mg Per Tube Q8H  . sodium chloride  10-40 mL Intracatheter Q12H  . DISCONTD: clonazePAM  1 mg Per Tube Q8H  . DISCONTD: clonazePAM  1 mg Per Tube Q12H  . DISCONTD: QUEtiapine  100 mg Per Tube Q8H   Continuous Infusions:   . dexmedetomidine 0.5 mcg/kg/hr (06/13/12 1159)  . fentaNYL infusion INTRAVENOUS 50 mcg/hr (06/13/12 1100)  . midazolam (VERSED) infusion Stopped (06/13/12 1058)  . sodium chloride 0.9 % 1,000 mL with potassium chloride 20 mEq infusion 50 mL/hr at 06/12/12 1338   PRN Meds:.acetaminophen (TYLENOL) oral liquid 160 mg/5 mL, atropine, bisacodyl, fentaNYL, midazolam, ondansetron (ZOFRAN) IV, ondansetron, sodium chloride  Labs:  CMP     Component Value Date/Time   NA 151*  06/13/2012 0500   K 4.3 06/13/2012 0500   CL 114* 06/13/2012 0500   CO2 29 06/13/2012 0500   GLUCOSE 199* 06/13/2012 0500   BUN 53* 06/13/2012 0500   CREATININE 1.07 06/13/2012 0500   CALCIUM 8.6 06/13/2012 0500   PROT 4.9* 06/04/2012 0420   ALBUMIN 2.3* 06/04/2012 0420   AST 22 06/04/2012 0420   ALT 15 06/04/2012 0420   ALKPHOS 90 06/04/2012 0420   BILITOT 0.6 06/04/2012 0420   GFRNONAA 71* 06/13/2012 0500   GFRAA 83* 06/13/2012 0500     Intake/Output Summary (Last 24 hours) at 06/13/12 1545 Last data filed at 06/13/12 1500  Gross per 24 hour  Intake 1835.03 ml  Output   2405 ml  Net -569.97 ml   Wt Readings from Last 10 Encounters:  06/12/12 247 lb 9.2 oz (112.3 kg)  06/12/12 247 lb 9.2 oz (112.3 kg)    Weight Status:  247 lb.  9/10 weight: 248 lb  Weight stable  Re-estimated needs remain the same:  2423 kcal and >/= 178 grams of protein  Nutrition Dx:  Inadequate Oral Intake, Ongoing.   Goal:  Enteral nutrition to provide 60-70% of estimated calorie needs (22-25 kcals/kg ideal body weight) and >/= 90% of estimated  protein needs, based on ASPEN guidelines for permissive underfeeding in critically ill obese individuals; met.   Monitor:  TF tolerance, weights, labs, Vent status   Adron Bene [phone]

## 2012-06-13 NOTE — Progress Notes (Signed)
Pt discussed in hospital LOS meeting this am.  
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
## 2012-06-13 NOTE — Plan of Care (Signed)
Problem: Inadequate Intake (NI-2.1) Goal: Food and/or nutrient delivery Individualized approach for food/nutrient provision.  Outcome: Progressing TF at goal rate with positive tolerance.
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
## 2012-06-14 ENCOUNTER — Inpatient Hospital Stay (HOSPITAL_COMMUNITY): Payer: Medicaid Other

## 2012-06-14 LAB — BASIC METABOLIC PANEL
BUN: 67 mg/dL — ABNORMAL HIGH (ref 6–23)
Calcium: 8.3 mg/dL — ABNORMAL LOW (ref 8.4–10.5)
GFR calc Af Amer: 80 mL/min — ABNORMAL LOW (ref >90)
GFR calc non Af Amer: 69 mL/min — ABNORMAL LOW (ref >90)
Potassium: 4.7 mEq/L (ref 3.5–5.1)
Sodium: 149 mEq/L — ABNORMAL HIGH (ref 135–145)

## 2012-06-14 LAB — CBC
Hemoglobin: 9.6 g/dL — ABNORMAL LOW (ref 13.0–17.0)
MCHC: 30.8 g/dL (ref 30.0–36.0)
Platelets: 100 10*3/uL — ABNORMAL LOW (ref 150–400)
RBC: 3.2 MIL/uL — ABNORMAL LOW (ref 4.22–5.81)

## 2012-06-14 LAB — GLUCOSE, CAPILLARY
Glucose-Capillary: 160 mg/dL — ABNORMAL HIGH (ref 70–99)
Glucose-Capillary: 198 mg/dL — ABNORMAL HIGH (ref 70–99)
Glucose-Capillary: 192 mg/dL — ABNORMAL HIGH (ref 70–99)
Glucose-Capillary: 158 mg/dL — ABNORMAL HIGH (ref 70–99)
Glucose-Capillary: 142 mg/dL — ABNORMAL HIGH (ref 70–99)

## 2012-06-14 MED ORDER — FREE WATER
250.0000 mL | Freq: Four times a day (QID) | Status: DC
  Administered 2012-06-14 – 2012-06-15 (×4): 250 mL

## 2012-06-14 MED ORDER — SODIUM CHLORIDE 0.9 % IV SOLN: INTRAVENOUS | Status: DC
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
## 2012-06-14 NOTE — Progress Notes (Signed)
Left flank chest removed per MD order. Removed per protocol, patient tolerated well.  Patient's RN at bedside and told issues to monitor for and if anything else is needed to just give [phone] a call back.
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
## 2012-06-15 ENCOUNTER — Inpatient Hospital Stay (HOSPITAL_COMMUNITY): Payer: Medicaid Other

## 2012-06-15 LAB — BASIC METABOLIC PANEL
BUN: 74 mg/dL — ABNORMAL HIGH (ref 6–23)
Chloride: 119 mEq/L — ABNORMAL HIGH (ref 96–112)
Chloride: 119 mEq/L — ABNORMAL HIGH (ref 96–112)
GFR calc Af Amer: 72 mL/min — ABNORMAL LOW (ref >90)
GFR calc Af Amer: 71 mL/min — ABNORMAL LOW (ref >90)
GFR calc non Af Amer: 62 mL/min — ABNORMAL LOW (ref >90)
Potassium: 4.3 mEq/L (ref 3.5–5.1)
Potassium: 4.1 mEq/L (ref 3.5–5.1)
Sodium: 153 mEq/L — ABNORMAL HIGH (ref 135–145)

## 2012-06-15 LAB — CBC
HCT: 27.5 % — ABNORMAL LOW (ref 39.0–52.0)
Hemoglobin: 8.2 g/dL — ABNORMAL LOW (ref 13.0–17.0)
Hemoglobin: 8.3 g/dL — ABNORMAL LOW (ref 13.0–17.0)
MCHC: 29.6 g/dL — ABNORMAL LOW (ref 30.0–36.0)
RBC: 2.83 MIL/uL — ABNORMAL LOW (ref 4.22–5.81)
RDW: 15.9 % — ABNORMAL HIGH (ref 11.5–15.5)
RDW: 15.6 % — ABNORMAL HIGH (ref 11.5–15.5)
WBC: 6.8 10*3/uL (ref 4.0–10.5)
WBC: 6.9 10*3/uL (ref 4.0–10.5)

## 2012-06-15 LAB — GLUCOSE, CAPILLARY
Glucose-Capillary: 139 mg/dL — ABNORMAL HIGH (ref 70–99)
Glucose-Capillary: 199 mg/dL — ABNORMAL HIGH (ref 70–99)

## 2012-06-15 LAB — TROPONIN I: Troponin I: <0.3 ng/mL (ref <0.30)

## 2012-06-15 MED ORDER — POTASSIUM CHLORIDE 2 MEQ/ML IV SOLN
INTRAVENOUS | Status: DC
  Administered 2012-06-15 – 2012-06-24 (×4): via INTRAVENOUS
  Filled 2012-06-15 (×5): qty 1000

## 2012-06-15 MED ORDER — INFLUENZA VIRUS VACC SPLIT PF IM SUSP
0.5000 mL | INTRAMUSCULAR | Status: AC
  Administered 2012-06-16: 0.5 mL via INTRAMUSCULAR
  Filled 2012-06-15: qty 0.5

## 2012-06-15 MED ORDER — ALTEPLASE 2 MG IJ SOLR
2.0000 mg | Freq: Once | INTRAMUSCULAR | Status: DC
  Filled 2012-06-15: qty 2

## 2012-06-15 MED ORDER — CLONAZEPAM 1 MG PO TABS
1.0000 mg | ORAL_TABLET | Freq: Two times a day (BID) | ORAL | Status: DC
  Administered 2012-06-15 – 2012-06-18 (×6): 1 mg
  Filled 2012-06-15 (×6): qty 1

## 2012-06-15 MED ORDER — FREE WATER
300.0000 mL | Freq: Four times a day (QID) | Status: DC
  Administered 2012-06-15 – 2012-06-16 (×5): 300 mL

## 2012-06-15 NOTE — Progress Notes (Signed)
QTc 549   D/C [place]

## 2012-06-15 NOTE — Progress Notes (Signed)
NSVT  EKG, electrolytes and cardiac enzymes ordered.
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
## 2012-06-15 NOTE — Progress Notes (Signed)
Subjective: Sedated on vent.  Objective: Vital signs in last 24 hours: Temp:  [98 F (36.7 C)-99.9 F (37.7 C)] 99.9 F (37.7 C) (09/14 0800) Pulse Rate:  [78-101] 86  (09/14 1100) Resp:  [15-31] 23  (09/14 1100) BP: (80-130)/(45-61) 106/49 mmHg (09/14 1100) SpO2:  [91 %-100 %] 100 % (09/14 1142) FiO2 (%):  [38.5 %-40 %] 40 % (09/14 1158) Weight:  [257 lb 15 oz (117 kg)] 257 lb 15 oz (117 kg) (09/14 0322) Last BM Date:  (flexi-seal)  Intake/Output from previous day: 09/13 0701 - 09/14 0700 In: 2411.8 [P.O.:765; I.V.:1406.8; NG/GT:240] Out: 2400 [Urine:2250; Chest Tube:150] Intake/Output this shift: Total I/O In: 434 [I.V.:354; NG/GT:80] Out: 500 [Urine:500]  PE: Lung-coarse bilaterally Abd-soft, reducible umbilical hernia Extr-bruising in lower extremities  Lab Results:   Basename 06/15/12 0700 06/14/12 0420  WBC 6.8 9.3  HGB 8.2* 9.6*  HCT 27.7* 31.2*  PLT 225 100*   BMET  Basename 06/15/12 0700 06/14/12 0420  NA 153* 149*  K 4.3 4.7  CL 119* 115*  CO2 26 24  GLUCOSE 153* 220*  BUN 73* 67*  CREATININE 1.20 1.10  CALCIUM 8.2* 8.3*   PT/INR No results found for this basename: LABPROT:2,INR:2 in the last 72 hours Comprehensive Metabolic Panel:    Component Value Date/Time   NA 153* 06/15/2012 0700   K 4.3 06/15/2012 0700   CL 119* 06/15/2012 0700   CO2 26 06/15/2012 0700   BUN 73* 06/15/2012 0700   CREATININE 1.20 06/15/2012 0700   GLUCOSE 153* 06/15/2012 0700   CALCIUM 8.2* 06/15/2012 0700   AST 22 06/04/2012 0420   ALT 15 06/04/2012 0420   ALKPHOS 90 06/04/2012 0420   BILITOT 0.6 06/04/2012 0420   PROT 4.9* 06/04/2012 0420   ALBUMIN 2.3* 06/04/2012 0420     Studies/Results: Dg Chest Port 1 View  06/15/2012  *RADIOLOGY REPORT*  Clinical Data: Respiratory failure  PORTABLE CHEST - 1 VIEW  Comparison: Yesterday  Findings: Endotracheal tube, NG tube, right arm PICC stable.  Left chest tube has been removed without ensuing left pneumothorax. Extensive rib  deformities are unchanged bilaterally.  Bibasilar hazy opacity is not significantly changed.  IMPRESSION: Left chest tube removal without pneumothorax.  Stable bibasilar hazy opacity.   Original Report Authenticated By: Donavan Burnet, M.D.    Dg Chest Port 1 View  06/14/2012  *RADIOLOGY REPORT*  Clinical Data: Respiratory failure  PORTABLE CHEST - 1 VIEW  Comparison: 06/13/2012; 06/12/2012; 06/11/2012  Findings:  Examination degraded secondary to exclusion of the right lower lung.  Stable positioning of support apparatus.  No pneumothorax. Grossly unchanged bibasilar heterogeneous opacities, right greater than left.  No definite pleural effusion on this delayed examination.  No pneumothorax.  Unchanged bones including fracture and displacement of multiple right-sided ribs.  IMPRESSION: 1.  Stable positioning of support apparatus.  No pneumothorax. 2.  Degraded examination with grossly unchanged bibasilar heterogeneous opacities, right greater than left, atelectasis versus infiltrate.   Original Report Authenticated By: Waynard Reeds, M.D.     Anti-infectives: Anti-infectives     Start     Dose/Rate Route Frequency Ordered Stop   06/04/12 1000   vancomycin (VANCOCIN) IVPB 1000 mg/200 mL premix  Status:  Discontinued        1,000 mg 200 mL/hr over 60 Minutes Intravenous Every 12 hours 06/04/12 0837 06/05/12 1045   06/03/12 1800   vancomycin (VANCOCIN) 1,500 mg in sodium chloride 0.9 % 500 mL IVPB  Status:  Discontinued  1,500 mg 250 mL/hr over 120 Minutes Intravenous Every 24 hours 06/02/12 1428 06/04/12 0837   06/02/12 1800   vancomycin (VANCOCIN) 2,000 mg in sodium chloride 0.9 % 500 mL IVPB        2,000 mg 250 mL/hr over 120 Minutes Intravenous  Once 06/02/12 1428 06/02/12 2024   06/02/12 1400   cefTAZidime (FORTAZ) 1 g in dextrose 5 % 50 mL IVPB        1 g 100 mL/hr over 30 Minutes Intravenous 3 times per day 06/02/12 1349 06/12/12 0642          Assessment  LOS: 15 days    MCC  VDRF -- Appreciate CCM's help, added Precedex with hope for extubation soon; if unable to extubate, will need trach.  TBI w/ICC -- per NS. R sided CVAs sen on MR.  CVA -- per NS  Multiple facial fxs -- Non-operative per ENT  Multiple bilateral rib fxs w/HTPX s/p bilateral CT -- no left ptx after chest tube removed. ABL anemia -- Hemoglobin down, platelets up; possibly dilutional with increase in free water Mild thrombocytopenia - follow  Cards -- Cardiology following for arrhythmias  FEN -- Na continues to rise - despite  Increase in free water; tolerating TFs  VTE -- SCD's    Plan:   Stop NS infusions and change to D5W.  Recheck lab and hemoglobin 9/15.   Colbie Sliker J 06/15/2012

## 2012-06-16 ENCOUNTER — Inpatient Hospital Stay (HOSPITAL_COMMUNITY): Payer: Medicaid Other

## 2012-06-16 LAB — URINALYSIS, ROUTINE W REFLEX MICROSCOPIC
Ketones, ur: NEGATIVE mg/dL
Leukocytes, UA: NEGATIVE
Protein, ur: NEGATIVE mg/dL
Urobilinogen, UA: >8 mg/dL — ABNORMAL HIGH (ref 0.0–1.0)

## 2012-06-16 LAB — BLOOD GAS, ARTERIAL
O2 Saturation: 94 %
PEEP: 5 cmH2O
Patient temperature: 98.6
RATE: 20 resp/min
pO2, Arterial: 70.2 mmHg — ABNORMAL LOW (ref 80.0–100.0)

## 2012-06-16 LAB — CBC
HCT: 28.2 % — ABNORMAL LOW (ref 39.0–52.0)
MCH: 29 pg (ref 26.0–34.0)
MCHC: 30.1 g/dL (ref 30.0–36.0)
MCV: 96.2 fL (ref 78.0–100.0)
RDW: 15.5 % (ref 11.5–15.5)
WBC: 7.1 10*3/uL (ref 4.0–10.5)

## 2012-06-16 LAB — BASIC METABOLIC PANEL
BUN: 74 mg/dL — ABNORMAL HIGH (ref 6–23)
CO2: 28 mEq/L (ref 19–32)
Chloride: 119 mEq/L — ABNORMAL HIGH (ref 96–112)
Creatinine, Ser: 1.22 mg/dL (ref 0.50–1.35)
Glucose, Bld: 169 mg/dL — ABNORMAL HIGH (ref 70–99)

## 2012-06-16 LAB — GLUCOSE, CAPILLARY
Glucose-Capillary: 178 mg/dL — ABNORMAL HIGH (ref 70–99)
Glucose-Capillary: 193 mg/dL — ABNORMAL HIGH (ref 70–99)
Glucose-Capillary: 173 mg/dL — ABNORMAL HIGH (ref 70–99)

## 2012-06-16 LAB — TROPONIN I: Troponin I: <0.3 ng/mL (ref <0.30)

## 2012-06-16 LAB — URINE MICROSCOPIC-ADD ON

## 2012-06-16 MED ORDER — DEXMEDETOMIDINE HCL IN NACL 400 MCG/100ML IV SOLN
0.4000 ug/kg/h | INTRAVENOUS | Status: DC
  Administered 2012-06-16 (×3): 0.5 ug/kg/h via INTRAVENOUS
  Administered 2012-06-17: 0.499 ug/kg/h via INTRAVENOUS
  Administered 2012-06-17: 0.5 ug/kg/h via INTRAVENOUS
  Administered 2012-06-17: 0.8 ug/kg/h via INTRAVENOUS
  Administered 2012-06-18: 0.5 ug/kg/h via INTRAVENOUS
  Filled 2012-06-16 (×8): qty 100

## 2012-06-16 MED ORDER — FREE WATER
350.0000 mL | Freq: Four times a day (QID) | Status: DC
  Administered 2012-06-16 – 2012-06-22 (×21): 350 mL

## 2012-06-16 NOTE — Procedures (Signed)
**Note De-Identified Viviene Thurston Obfuscation** Arterial Catheter Insertion Procedure Note Duaine Radin [id number] [birth year]  Procedure: Insertion of Arterial Catheter  Indications: Blood pressure monitoring and Frequent blood sampling  Procedure Details Consent: Risks of procedure as well as the alternatives and risks of each were explained to the (patient/caregiver).  Consent for procedure obtained. Time Out: Verified patient identification, verified procedure, site/side was marked, verified correct patient position, special equipment/implants available, medications/allergies/relevent history reviewed, required imaging and test results available.  Performed  Maximum sterile technique was used including antiseptics, cap, gloves, gown, hand hygiene and sheet. Skin prep: Chlorhexidine; local anesthetic administered 20 gauge catheter was inserted into left radial artery using the Seldinger technique.  Evaluation Blood flow good; BP tracing good. Complications: No apparent complications.   Janari Yamada, Megan Salon 06/16/2012
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
## 2012-06-16 NOTE — Plan of Care (Signed)
Problem: Phase I Progression Outcomes Goal: Patient tolerating weaning plan Outcome: Not Progressing 9/15: Pt requires increased amount of oxygen to maintain saturation. Currently increased from 30% to 60%. Goal: Tracheostomy by Vent Day 14 Outcome: Progressing 9/15 - Trach/PEG originally scheduled for 9/9; decision to attempt to wean and extubate so original plan adjusted. Goal: Voiding-avoid urinary catheter unless indicated Outcome: Completed/Met Date Met:  06/16/12 9/15: Pt has been successful with condom cath.

## 2012-06-17 ENCOUNTER — Inpatient Hospital Stay (HOSPITAL_COMMUNITY): Payer: Medicaid Other

## 2012-06-17 DIAGNOSIS — I82B19 Acute embolism and thrombosis of unspecified subclavian vein: Secondary | ICD-10-CM

## 2012-06-17 DIAGNOSIS — I82A19 Acute embolism and thrombosis of unspecified axillary vein: Secondary | ICD-10-CM

## 2012-06-17 LAB — GLUCOSE, CAPILLARY
Glucose-Capillary: 122 mg/dL — ABNORMAL HIGH (ref 70–99)
Glucose-Capillary: 157 mg/dL — ABNORMAL HIGH (ref 70–99)
Glucose-Capillary: 188 mg/dL — ABNORMAL HIGH (ref 70–99)
Glucose-Capillary: 244 mg/dL — ABNORMAL HIGH (ref 70–99)

## 2012-06-17 LAB — BASIC METABOLIC PANEL
Chloride: 113 mEq/L — ABNORMAL HIGH (ref 96–112)
GFR calc Af Amer: 77 mL/min — ABNORMAL LOW (ref >90)
Potassium: 3.9 mEq/L (ref 3.5–5.1)
Sodium: 147 mEq/L — ABNORMAL HIGH (ref 135–145)

## 2012-06-17 LAB — CBC
Platelets: 211 10*3/uL (ref 150–400)
RDW: 15.2 % (ref 11.5–15.5)
WBC: 8.5 10*3/uL (ref 4.0–10.5)

## 2012-06-17 MED ORDER — ENOXAPARIN SODIUM 120 MG/0.8ML ~~LOC~~ SOLN
1.0000 mg/kg | Freq: Once | SUBCUTANEOUS | Status: AC
  Administered 2012-06-17: 115 mg via SUBCUTANEOUS
  Filled 2012-06-17: qty 0.8

## 2012-06-17 MED ORDER — SODIUM CHLORIDE 0.9 % IV BOLUS (SEPSIS)
500.0000 mL | Freq: Once | INTRAVENOUS | Status: AC
  Administered 2012-06-17: 500 mL via INTRAVENOUS

## 2012-06-17 MED ORDER — ENOXAPARIN SODIUM 120 MG/0.8ML ~~LOC~~ SOLN
1.0000 mg/kg | Freq: Two times a day (BID) | SUBCUTANEOUS | Status: DC
  Administered 2012-06-18 – 2012-06-27 (×18): 115 mg via SUBCUTANEOUS
  Filled 2012-06-17 (×23): qty 0.8

## 2012-06-17 MED ORDER — FENTANYL CITRATE 0.05 MG/ML IJ SOLN
200.0000 ug | Freq: Once | INTRAMUSCULAR | Status: AC
  Administered 2012-06-18: 100 ug via INTRAVENOUS
  Filled 2012-06-17: qty 4

## 2012-06-17 MED ORDER — ETOMIDATE 2 MG/ML IV SOLN
40.0000 mg | Freq: Once | INTRAVENOUS | Status: AC
  Administered 2012-06-18: 30 mg via INTRAVENOUS
  Filled 2012-06-17 (×2): qty 20

## 2012-06-17 MED ORDER — VECURONIUM BROMIDE 10 MG IV SOLR
10.0000 mg | Freq: Once | INTRAVENOUS | Status: DC
  Filled 2012-06-17: qty 10

## 2012-06-17 MED ORDER — MIDAZOLAM HCL 2 MG/2ML IJ SOLN
5.0000 mg | Freq: Once | INTRAMUSCULAR | Status: AC
  Administered 2012-06-18: 2 mg via INTRAVENOUS
  Filled 2012-06-17: qty 6

## 2012-06-17 MED ORDER — WARFARIN - PHARMACIST DOSING INPATIENT: Freq: Every day | Status: DC

## 2012-06-17 MED ORDER — PROPOFOL 10 MG/ML IV EMUL
5.0000 ug/kg/min | Freq: Once | INTRAVENOUS | Status: DC
  Filled 2012-06-17: qty 100

## 2012-06-17 MED ORDER — WARFARIN SODIUM 5 MG PO TABS
5.0000 mg | ORAL_TABLET | Freq: Once | ORAL | Status: AC
  Administered 2012-06-17: 5 mg
  Filled 2012-06-17: qty 1
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
## 2012-06-17 NOTE — Progress Notes (Signed)
Dr. Lindie Spruce was notified with results from vascular lab that pt has DVT in subclavian and axillary veins with SVT in the basilic vein. No new orders given

## 2012-06-17 NOTE — Progress Notes (Signed)
UR complete 

## 2012-06-17 NOTE — Progress Notes (Signed)
Right:  DVT noted in the subclavian and axillary veins with SVT in the basilic vein.

## 2012-06-17 NOTE — Progress Notes (Signed)
Trauma Service Note  Subjective: Patient also being seen by CCM.  They will provide most of the critical care management because Dr. Janee Morn and myself are gone for the rest of the week after today.  He will respond, but not able to wean from the ventilator.  Objective: Vital signs in last 24 hours: Temp:  [100.4 F (38 C)-102.7 F (39.3 C)] 101.3 F (38.5 C) (09/16 0400) Pulse Rate:  [74-104] 74  (09/16 0800) Resp:  [17-32] 19  (09/16 0800) BP: (99-166)/(43-69) 116/54 mmHg (09/16 0800) SpO2:  [90 %-99 %] 93 % (09/16 0800) Arterial Line BP: (110-155)/(42-57) 121/42 mmHg (09/16 0800) FiO2 (%):  [48.8 %-60 %] 57.9 % (09/16 0800) Weight:  [111.8 kg (246 lb 7.6 oz)] 111.8 kg (246 lb 7.6 oz) (09/16 0500) Last BM Date: 06/15/12  Intake/Output from previous day: 09/15 0701 - 09/16 0700 In: 3169.4 [I.V.:809.4; NG/GT:2360] Out: 2635 [Urine:2635] Intake/Output this shift:    General: No distress, but O2 sats in the low to mid 90's.  Lungs: Decreased in right base, but not as much as expected from CXR which shows significant atelectasis in the RLL.  No ABG  Abd: Distended but not tense.  Tolerating tube feedings well.  Extremities: No changes.    Neuro: Agitated at time.  Some delirium to deal with.  Lab Results: CBC   Basename 06/17/12 0515 06/16/12 0354  WBC 8.5 7.1  HGB 8.2* 8.5*  HCT 26.8* 28.2*  PLT 211 214   BMET  Basename 06/17/12 0515 06/16/12 0354  NA 147* 152*  K 3.9 4.1  CL 113* 119*  CO2 26 28  GLUCOSE 187* 169*  BUN 66* 74*  CREATININE 1.14 1.22  CALCIUM 7.9* 8.1*   PT/INR No results found for this basename: LABPROT:2,INR:2 in the last 72 hours ABG  Basename 06/16/12 1636  PHART 7.419  HCO3 25.5*    Studies/Results: Dg Chest Port 1 View  06/17/2012  *RADIOLOGY REPORT*  Clinical Data: Evaluate endotracheal tube.  PORTABLE CHEST - 1 VIEW  Comparison: 06/16/2012.  Findings: The support apparatus is stable.  Persistent bibasilar atelectasis.  No  large pleural effusion or pneumothorax.  IMPRESSION:  1.  Stable support apparatus. 2.  Persistent bibasilar atelectasis.   Original Report Authenticated By: P. Loralie Champagne, M.D.    Dg Chest Port 1 View  06/16/2012  *RADIOLOGY REPORT*  Clinical Data: Respiratory failure.  Ventilator support  PORTABLE CHEST - 1 VIEW  Comparison:  09/14 and multiple previous   Findings:  Endotracheal tube, nasogastric tube and right arm PICC are unchanged in satisfactory.  Artifact overlies chest.  Volume loss of both lower lobes persist, with worsened atelectasis on the left.  The upper lungs remain clear. Rib fractures appear the same  IMPRESSION: Lines and tubes well positioned.  Worsened volume loss left base.   Original Report Authenticated By: Thomasenia Sales, M.D.     Anti-infectives: Anti-infectives     Start     Dose/Rate Route Frequency Ordered Stop   06/04/12 1000   vancomycin (VANCOCIN) IVPB 1000 mg/200 mL premix  Status:  Discontinued        1,000 mg 200 mL/hr over 60 Minutes Intravenous Every 12 hours 06/04/12 0837 06/05/12 1045   06/03/12 1800   vancomycin (VANCOCIN) 1,500 mg in sodium chloride 0.9 % 500 mL IVPB  Status:  Discontinued        1,500 mg 250 mL/hr over 120 Minutes Intravenous Every 24 hours 06/02/12 1428 06/04/12 0837   06/02/12 1800  vancomycin (VANCOCIN) 2,000 mg in sodium chloride 0.9 % 500 mL IVPB        2,000 mg 250 mL/hr over 120 Minutes Intravenous  Once 06/02/12 1428 06/02/12 2024   06/02/12 1400   cefTAZidime (FORTAZ) 1 g in dextrose 5 % 50 mL IVPB        1 g 100 mL/hr over 30 Minutes Intravenous 3 times per day 06/02/12 1349 06/12/12 0642          Assessment/Plan: s/p Procedure(s): PERCUTANEOUS TRACHEOSTOMY PERCUTANEOUS ENDOSCOPIC GASTROSTOMY (PEG) PLACEMENT Now on FIO2 60%.  Not weaning very well.  Will probably be candidate for trach/PEG later this week, but Dr. Janee Morn and I are not back until next week Continue tube feedings.   LOS: 17 days   Marta Lamas.  Gae Bon, MD, FACS [phone] Trauma Surgeon 06/17/2012

## 2012-06-17 NOTE — Progress Notes (Signed)
eLink Physician-Brief Progress Note Patient Name: Alex Foster DOB: [birth year] MRN: [id number]  Date of Service  06/17/2012   HPI/Events of Note  Patient with hypotension on precedex - current BP of 88/46 MAP 55.  CVP earlier had been 14 but now 8 while resting and not agitated.  Precedex held.    eICU Interventions  Plan: 1 time bolus of 500 cc NS for hypotension    Intervention Category Intermediate Interventions: Hypotension - evaluation and management  Fatmata Legere 06/17/2012, 11:33 PM
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
## 2012-06-18 ENCOUNTER — Inpatient Hospital Stay (HOSPITAL_COMMUNITY): Payer: Medicaid Other

## 2012-06-18 ENCOUNTER — Ambulatory Visit: Payer: Medicaid Other | Admitting: Family Medicine

## 2012-06-18 LAB — BASIC METABOLIC PANEL
BUN: 68 mg/dL — ABNORMAL HIGH (ref 6–23)
Chloride: 115 mEq/L — ABNORMAL HIGH (ref 96–112)
GFR calc Af Amer: 67 mL/min — ABNORMAL LOW (ref >90)
Potassium: 3.9 mEq/L (ref 3.5–5.1)

## 2012-06-18 LAB — CBC
HCT: 29.8 % — ABNORMAL LOW (ref 39.0–52.0)
Hemoglobin: 9 g/dL — ABNORMAL LOW (ref 13.0–17.0)
WBC: 10.4 10*3/uL (ref 4.0–10.5)

## 2012-06-18 LAB — URINE CULTURE
Colony Count: NO GROWTH
Culture: NO GROWTH

## 2012-06-18 LAB — GLUCOSE, CAPILLARY
Glucose-Capillary: 137 mg/dL — ABNORMAL HIGH (ref 70–99)
Glucose-Capillary: 127 mg/dL — ABNORMAL HIGH (ref 70–99)

## 2012-06-18 LAB — PROTIME-INR: INR: 1.37 (ref 0.00–1.49)

## 2012-06-18 MED ORDER — SODIUM CHLORIDE 0.9 % IV SOLN
INTRAVENOUS | Status: DC
  Administered 2012-06-21: 20 mL/h via INTRAVENOUS

## 2012-06-18 MED ORDER — INSULIN GLARGINE 100 UNIT/ML ~~LOC~~ SOLN
30.0000 [IU] | Freq: Every day | SUBCUTANEOUS | Status: DC
  Administered 2012-06-18 – 2012-06-20 (×3): 30 [IU] via SUBCUTANEOUS

## 2012-06-18 MED ORDER — FENTANYL CITRATE 0.05 MG/ML IJ SOLN
INTRAMUSCULAR | Status: AC
  Administered 2012-06-18: 50 ug
  Filled 2012-06-18: qty 2

## 2012-06-18 MED ORDER — SODIUM CHLORIDE 0.9 % IV BOLUS (SEPSIS): 500.0000 mL | Freq: Once | INTRAVENOUS | Status: DC

## 2012-06-18 MED ORDER — IPRATROPIUM BROMIDE 0.02 % IN SOLN: 0.5000 mg | RESPIRATORY_TRACT | Status: DC | PRN

## 2012-06-18 MED ORDER — FENTANYL CITRATE 0.05 MG/ML IJ SOLN
25.0000 ug | INTRAMUSCULAR | Status: AC | PRN
  Administered 2012-06-18 (×3): 50 ug via INTRAVENOUS
  Administered 2012-06-18: 100 ug via INTRAVENOUS
  Filled 2012-06-18 (×3): qty 2

## 2012-06-18 MED ORDER — SODIUM CHLORIDE 0.9 % IV BOLUS (SEPSIS)
500.0000 mL | Freq: Once | INTRAVENOUS | Status: AC
  Administered 2012-06-18: 500 mL via INTRAVENOUS

## 2012-06-18 MED ORDER — ALBUTEROL SULFATE (5 MG/ML) 0.5% IN NEBU: 2.5000 mg | INHALATION_SOLUTION | RESPIRATORY_TRACT | Status: DC | PRN

## 2012-06-18 MED ORDER — ACETAMINOPHEN 10 MG/ML IV SOLN
1000.0000 mg | Freq: Four times a day (QID) | INTRAVENOUS | Status: AC
  Administered 2012-06-18 – 2012-06-19 (×4): 1000 mg via INTRAVENOUS
  Filled 2012-06-18 (×4): qty 100

## 2012-06-18 MED ORDER — SODIUM CHLORIDE 0.9 % IV BOLUS (SEPSIS)
1000.0000 mL | Freq: Once | INTRAVENOUS | Status: AC
  Administered 2012-06-18: 1000 mL via INTRAVENOUS

## 2012-06-18 NOTE — Progress Notes (Signed)
Patient ID: Alex Foster, male   DOB: [birth year], 66 y.o.   MRN: [id number]   LOS: 18 days   Subjective: On vent, wide awake. +FC w/right side. For perc trach by CCM shortly.  Objective: Vital signs in last 24 hours: Temp:  [100.1 F (37.8 C)-102.5 F (39.2 C)] 102.5 F (39.2 C) (09/17 0400) Pulse Rate:  [67-110] 81  (09/17 0700) Resp:  [17-35] 20  (09/17 0700) BP: (85-149)/(44-70) 99/45 mmHg (09/17 0700) SpO2:  [92 %-100 %] 98 % (09/17 0700) Arterial Line BP: (121-180)/(42-61) 166/61 mmHg (09/16 1900) FiO2 (%):  [30 %-60 %] 60 % (09/17 0700) Weight:  [112.9 kg (248 lb 14.4 oz)-113.6 kg (250 lb 7.1 oz)] 112.9 kg (248 lb 14.4 oz) (09/17 0400) Last BM Date: 06/17/12   UOP: 20ml/h NET: +283ml/24h TOTAL: +5140ml/admission   Lab Results:  CBC  Basename 06/18/12 0415 06/17/12 0515  WBC 10.4 8.5  HGB 9.0* 8.2*  HCT 29.8* 26.8*  PLT 219 211   BMET  Basename 06/18/12 0415 06/17/12 0515  NA 148* 147*  K 3.9 3.9  CL 115* 113*  CO2 25 26  GLUCOSE 198* 187*  BUN 68* 66*  CREATININE 1.28 1.14  CALCIUM 8.1* 7.9*   CBG (last 3)   Basename 06/18/12 0410 06/18/12 0011 06/17/12 2007  GLUCAP 191* 172* 239*   Lab Results  Component Value Date   INR 1.37 06/18/2012   INR 1.38 06/01/2012   INR 1.14 05/31/2012     Radiology PORTABLE CHEST - 1 VIEW  Comparison: 06/17/2012  Findings: Cardiomediastinal silhouette is stable. Endotracheal  tube with tip 5.1 cm above the carina. Stable right arm PICC line  position with tip in SVC. Stable NG tube position. Persistent  bilateral basilar atelectasis or infiltrate right greater than  left. No pulmonary edema. No diagnostic pneumothorax. Bilateral  rib deformities are stable.  IMPRESSION:  Stable support apparatus. Persistent bilateral basilar atelectasis  or infiltrate right greater than left. No pulmonary edema.  Original Report Authenticated By: Natasha Mead, M.D.   General appearance: alert and mild distress Resp: clear to  auscultation bilaterally Cardio: Distant GI: Soft, diminished BS.   Assessment/Plan: [facility]  VDRF -- Appreciate CCM's help. Janina Mayo today should help with wean. TBI w/ICC -- per NS.  CVA -- per NS  Multiple facial fxs -- Non-operative per ENT  Multiple bilateral rib fxs w/HTPX s/p bilateral CT  ABL anemia -- Stable  UE DVT -- Lovenox + coumadin. D/w NS who ok'd, watch for signs of ICH ID -- Persistent high fevers, WBC normal but trending up over last few days. Urine and blood cultures obtained 2d ago, no growth thus far. Will get resp culture. Cards -- Cardiology following for arrhythmias  FEN -- Watch renal fxn. Increase lantus. Dispo -- VDRF     Freeman Caldron, PA-C Pager: [phone] General Trauma PA Pager: [phone]   06/18/2012

## 2012-06-18 NOTE — Procedures (Signed)
Bedside Tracheostomy Insertion Procedure Note   Patient Details:   Name: Kyce Ging DOB: [birth year] MRN: [id number]  Procedure: Tracheostomy  Pt was placed on 100% fio2 and rate of 20 in prep for trach procedure, RT covering unit aware.  Pre Procedure Assessment: ET Tube Size:7.5 ET Tube secured at lip (cm): 23  Bite block in place: Yes Breath Sounds: Diminished  Post Procedure Assessment: BP 138/60  Pulse 103  Temp 102.5 F (39.2 C) (Oral)  Resp 26  Ht 6\' 3"  (1.905 m)  Wt 248 lb 14.4 oz (112.9 kg)  BMI 31.11 kg/m2  SpO2 100% O2 sats: stable throughout Complications: No apparent complications Patient did tolerate procedure well Tracheostomy Brand:Shiley Tracheostomy Style:Cuffed Tracheostomy Size: 8.0 Tracheostomy Secured YQM:VHQION, and sutured Tracheostomy Placement Confirmation :cxray ordered ,Trach cuff visualized and in place  Post bronch, pt remained on rate 20, fio2 100% d/t pt still sedated.  RT covering unit aware.  Jennette Kettle 06/18/2012, 9:15 AM

## 2012-06-18 NOTE — Clinical Social Work Note (Signed)
Clinical Social Worker continuing to follow patient for emotional support and to facilitate patient discharge planning needs.  Patient required trach placement 09/17 and remains on the ventilator at this time.  Per PA, hopes are for patient to wean to trach collar prior to discharge.  Patient with eyes open and alert to self only - no family present at bedside.  CSW will continue to follow and initiate SNF search once patient is able to wean to trach collar - patient brother in agreement.  Clinical Social Worker will continue to provide support and facilitate patient discharge needs once medically stable.  Macario Golds, [place] [phone]

## 2012-06-18 NOTE — Procedures (Signed)
Bronchoscopy Procedure Note Ahron Hulbert [id number] 01-Apr-1946  Procedure: Bronchoscopy Indications: Diagnostic evaluation of the airways to assist with percutaneous tracheostomy placement  Procedure Details Consent: Risks of procedure as well as the alternatives and risks of each were explained to the (patient/caregiver).  Consent for procedure obtained. Time Out: Verified patient identification, verified procedure, site/side was marked, verified correct patient position, special equipment/implants available, medications/allergies/relevent history reviewed, required imaging and test results available.  Performed  In preparation for procedure, patient was given 100% FiO2 and bronchoscope lubricated. Sedation: Benzodiazepines and Etomidate  Bronchoscope entered through ETT.  ETT withdrawn to 19 cm.  Good visualization of airway, and visualized entry of Size 8 tracheostomy.  No evidence for bleeding or posterior wall trauma.  Bronchoscope, then withdrawn and vent shifted to tracheostomy.  Bronchoscope entered through tracheostomy.  Tracheostomy in good position above carina w/o evidence for airway bleeding or trauma.  Bronchoscope then withdrawn.  Evaluation Hemodynamic Status: Transient hypotension treated with fluid; O2 sats: stable throughout Patient's Current Condition: stable Specimens:  None Complications: No apparent complications Patient did tolerate procedure well.   Coralyn Helling, MD [facility] Pulmonary/Critical Care 06/18/2012, 8:52 AM Pager:  [phone] After 3pm call: [phone]

## 2012-06-18 NOTE — Procedures (Signed)
Perc trach  see full dication Size 8 placed Blood loss less 20 cc tolerated well  [name]. [name], MD, FACP Pgr: [phone] [place] [facility]

## 2012-06-18 NOTE — Progress Notes (Signed)
ANTICOAGULATION CONSULT NOTE - Follow Up Consult  Pharmacy Consult for Coumadin Indication: RUE DVT - subclavian & axillary veins  No Known Allergies  Patient Measurements: Height: 6\' 3"  (190.5 cm) Weight: 248 lb 14.4 oz (112.9 kg) IBW/kg (Calculated) : 84.5   Vital Signs: Temp: 101.2 F (38.4 C) (09/17 1200) Temp src: Axillary (09/17 1200) BP: 116/48 mmHg (09/17 1500) Pulse Rate: 93  (09/17 1500)  Labs:  Basename 06/18/12 0415 06/17/12 0515 06/16/12 0940 06/16/12 0354 06/15/12 2101  HGB 9.0* 8.2* -- -- --  HCT 29.8* 26.8* -- 28.2* --  PLT 219 211 -- 214 --  APTT -- -- -- -- --  LABPROT 17.1* -- -- -- --  INR 1.37 -- -- -- --  HEPARINUNFRC -- -- -- -- --  CREATININE 1.28 1.14 -- 1.22 --  CKTOTAL -- -- -- -- --  CKMB -- -- -- -- --  TROPONINI -- -- <0.30 <0.30 <0.30    Estimated Creatinine Clearance: 79.1 ml/min (by C-G formula based on Cr of 1.28).  Assessment:  Full-dose Lovenox begun 9/16 for new RUE DVT.  Coumadin also begun with 5 mg (crushed) on 9/16.  Lovenox held this am for trach.  To resume this pm.  Discussed briefly with Prince Rome, PA-C. Possible need for PEG at some point.  Swallow evaluation pending.  Goal of Therapy:  INR 2-3 Anti-Xa level 0.6-1.2 units/ml 4hrs after LMWH dose given Monitor platelets by anticoagulation protocol: Yes   Plan:   Will hold Coumadin today. Continue daily PT/INR for now.  Continue Lovenox 115 mg SQ q12hrs per MD.  Will follow-up swallow evaluation with you.  Dennie Fetters, [place] Pager: [phone] 06/18/2012,3:19 PM
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
## 2012-06-18 NOTE — Progress Notes (Signed)
Meds given during tracheostomy 250 mcg fentanyl, 4 mg versed, 30 mg etomidate.   Meds wasted in sink witnessed by Ferman Hamming, RN 10 mg etomidate , 10 mg vecuronium.

## 2012-06-18 NOTE — Progress Notes (Signed)
Bladder scan performed and showed 693 cc of urine; MD notified and order given for foley catheter; 14 french foley catheter inserted without difficulty; 550 cc clear, amber urine noted.

## 2012-06-18 NOTE — Progress Notes (Signed)
Pt discussed at hospital LOS meeting this am.  

## 2012-06-18 NOTE — Progress Notes (Signed)
Speech Language Pathology  Patient Details Name: Alex Foster MRN: [id number] DOB: [birth year] Today's Date: 06/18/2012 Time:  -    Received order for Passy-Muir Speaking valve.  Pt. currently on vent and received trach this am which will assist with vent wean.  Requested order for TBI team (trauma PA agreed to order).  TBI team will assess with ST following for cognition-communication and PMSV when pt. able to wean from vent.  Breck Coons [place].[employer] [phone]  06/18/2012

## 2012-06-18 NOTE — Progress Notes (Signed)
MD called regarding pts sustained high temp of 102 w/ IV tylenol and ice packs. Advised to recheck temperature in 1 hour and continue monitoring pt.   Cyndie Chime Dunbar

## 2012-06-18 NOTE — Progress Notes (Signed)
Agree with above.  Trach'ed today by CCM.  Pt doing well.   Anticoag for SCV DVT.
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
## 2012-06-19 ENCOUNTER — Inpatient Hospital Stay (HOSPITAL_COMMUNITY): Payer: Medicaid Other

## 2012-06-19 LAB — CBC
HCT: 31.4 % — ABNORMAL LOW (ref 39.0–52.0)
Hemoglobin: 9.7 g/dL — ABNORMAL LOW (ref 13.0–17.0)
MCH: 29.6 pg (ref 26.0–34.0)
MCHC: 30.9 g/dL (ref 30.0–36.0)
MCV: 95.7 fL (ref 78.0–100.0)

## 2012-06-19 LAB — BASIC METABOLIC PANEL
BUN: 64 mg/dL — ABNORMAL HIGH (ref 6–23)
GFR calc non Af Amer: 51 mL/min — ABNORMAL LOW (ref >90)
Glucose, Bld: 144 mg/dL — ABNORMAL HIGH (ref 70–99)
Potassium: 3.7 mEq/L (ref 3.5–5.1)

## 2012-06-19 LAB — GLUCOSE, CAPILLARY
Glucose-Capillary: 135 mg/dL — ABNORMAL HIGH (ref 70–99)
Glucose-Capillary: 90 mg/dL (ref 70–99)
Glucose-Capillary: 100 mg/dL — ABNORMAL HIGH (ref 70–99)
Glucose-Capillary: 145 mg/dL — ABNORMAL HIGH (ref 70–99)

## 2012-06-19 MED ORDER — CLONAZEPAM 1 MG PO TABS
1.0000 mg | ORAL_TABLET | Freq: Three times a day (TID) | ORAL | Status: DC
  Administered 2012-06-19 – 2012-06-21 (×6): 1 mg
  Filled 2012-06-19 (×7): qty 1

## 2012-06-19 MED ORDER — DEXTROSE 5 % IV SOLN
2.0000 g | Freq: Three times a day (TID) | INTRAVENOUS | Status: DC
  Administered 2012-06-19 – 2012-06-24 (×17): 2 g via INTRAVENOUS
  Filled 2012-06-19 (×19): qty 2

## 2012-06-19 MED ORDER — FENTANYL 25 MCG/HR TD PT72
50.0000 ug | MEDICATED_PATCH | TRANSDERMAL | Status: DC
  Administered 2012-06-19 – 2012-07-07 (×7): 50 ug via TRANSDERMAL
  Filled 2012-06-19: qty 2
  Filled 2012-06-19 (×7): qty 1

## 2012-06-19 MED ORDER — VANCOMYCIN HCL 1000 MG IV SOLR
2000.0000 mg | Freq: Once | INTRAVENOUS | Status: AC
  Administered 2012-06-19: 2000 mg via INTRAVENOUS
  Filled 2012-06-19: qty 2000

## 2012-06-19 MED ORDER — VANCOMYCIN HCL IN DEXTROSE 1-5 GM/200ML-% IV SOLN
1000.0000 mg | Freq: Two times a day (BID) | INTRAVENOUS | Status: DC
  Administered 2012-06-19 – 2012-06-22 (×6): 1000 mg via INTRAVENOUS
  Filled 2012-06-19 (×7): qty 200
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
## 2012-06-19 NOTE — Progress Notes (Signed)
Subjective: No new issues.  Still febrile.  Objective: Vital signs in last 24 hours: Temp:  [99.7 F (37.6 C)-103.4 F (39.7 C)] 100.7 F (38.2 C) (09/18 0400) Pulse Rate:  [77-125] 88  (09/18 0822) Resp:  [18-37] 23  (09/18 0822) BP: (75-148)/(36-85) 109/48 mmHg (09/18 0800) SpO2:  [88 %-100 %] 97 % (09/18 0822) FiO2 (%):  [48 %-95.6 %] 50 % (09/18 0822) Weight:  [248 lb 3.8 oz (112.6 kg)] 248 lb 3.8 oz (112.6 kg) (09/18 0500) Last BM Date: 06/19/12  Intake/Output from previous day: 09/17 0701 - 09/18 0700 In: 4536.3 [P.O.:130; I.V.:1026.3; NG/GT:340; IV Piggyback:1800] Out: 2520 [Urine:2095; Stool:425] Intake/Output this shift: Total I/O In: 67 [I.V.:47; NG/GT:20] Out: 375 [Urine:275; Emesis/NG output:100]  General appearance: cooperative and no distress Resp: bilateral expiratory wheeze with rhonchi Cardio: normal rate, HR 90, regular GI: soft, non-tender; bowel sounds normal; no masses,  no organomegaly Extremities: SCD's bilat. LE, only moving right side of body, +left hemiparesis Neurologic: Mental status: he responds appropriately to questions, but agitated and with left hemiparesis.  Lab Results:   Basename 06/19/12 0525 06/18/12 0415  WBC 13.6* 10.4  HGB 9.7* 9.0*  HCT 31.4* 29.8*  PLT 245 219   BMET  Basename 06/19/12 0525 06/18/12 0415  NA 154* 148*  K 3.7 3.9  CL 120* 115*  CO2 25 25  GLUCOSE 144* 198*  BUN 64* 68*  CREATININE 1.42* 1.28  CALCIUM 8.1* 8.1*   PT/INR  Basename 06/19/12 0525 06/18/12 0415  LABPROT 18.7* 17.1*  INR 1.62* 1.37   ABG  Basename 06/16/12 1636  PHART 7.419  HCO3 25.5*    Studies/Results: Dg Chest Port 1 View  06/19/2012  *RADIOLOGY REPORT*  Clinical Data: VDRF  PORTABLE CHEST - 1 VIEW  Comparison: 06/18/2012; 06/17/2012; 06/16/2012; chest CT - 05/31/2012  Findings:  Examination degraded secondary to exclusion of the bilateral costophrenic angles.  Grossly unchanged cardiac silhouette and mediastinal contours.  Stable position of support apparatus.  Grossly unchanged bilateral mid and lower lung heterogeneous air space opacities, left greater than right.  No definite pleural effusion, though note, the bilateral costophrenic angles are excluded from view.  No pneumothorax.  Grossly unchanged bones including multiple displaced rib fractures, right greater than left.  IMPRESSION: Degraded examination with grossly unchanged mid and lower lung heterogeneous airspace opacities, right greater than left, possibly atelectasis/contusion though worrisome for multifocal infection.   Original Report Authenticated By: Waynard Reeds, M.D.    Chest Portable 1 View To Assess Tube Placement And Rule-out Pneumothorax  06/18/2012  *RADIOLOGY REPORT*  Clinical Data: Tube placement  PORTABLE CHEST - 1 VIEW  Comparison: 01/1939 hour  Findings: Endotracheal tube has been exchanged for a tracheostomy tube.  Tip is 5.2 cm from the carina.  NG tubes stable.  Right PICC stable.  Bibasilar haziness has increased likely worsening volume loss.  No pneumothorax.  Right  multiple rib deformities stable. Left rib deformities stable.  IMPRESSION: Tracheostomy tube placed with its tip 5.2 cm from the carina.  No pneumothorax.  Increasing volume loss at the bases.   Original Report Authenticated By: Donavan Burnet, M.D.    Dg Chest Port 1 View  06/18/2012  *RADIOLOGY REPORT*  Clinical Data: Follow up atelectasis  PORTABLE CHEST - 1 VIEW  Comparison: 06/17/2012  Findings: Cardiomediastinal silhouette is stable.  Endotracheal tube with tip 5.1 cm above the carina.  Stable right arm PICC line position with tip in SVC.  Stable NG tube position.  Persistent  bilateral basilar atelectasis or infiltrate right greater than left.  No pulmonary edema.  No diagnostic pneumothorax.  Bilateral rib deformities are stable.  IMPRESSION:  Stable support apparatus.  Persistent bilateral basilar atelectasis or infiltrate right greater than left.  No pulmonary edema.    Original Report Authenticated By: Natasha Mead, M.D.    Respiratory cultures from 9/17 ABUNDANT WBC PRESENT, PREDOMINANTLY PMN RARE SQUAMOUS EPITHELIAL CELLS PRESENT FEW GRAM POSITIVE COCCI IN CLUSTERS RARE GRAM POSITIVE RODS RARE GRAM POSITIVE COCCI IN CHAINS   Anti-infectives: Anti-infectives     Start     Dose/Rate Route Frequency Ordered Stop   06/04/12 1000   vancomycin (VANCOCIN) IVPB 1000 mg/200 mL premix  Status:  Discontinued        1,000 mg 200 mL/hr over 60 Minutes Intravenous Every 12 hours 06/04/12 0837 06/05/12 1045   06/03/12 1800   vancomycin (VANCOCIN) 1,500 mg in sodium chloride 0.9 % 500 mL IVPB  Status:  Discontinued        1,500 mg 250 mL/hr over 120 Minutes Intravenous Every 24 hours 06/02/12 1428 06/04/12 0837   06/02/12 1800   vancomycin (VANCOCIN) 2,000 mg in sodium chloride 0.9 % 500 mL IVPB        2,000 mg 250 mL/hr over 120 Minutes Intravenous  Once 06/02/12 1428 06/02/12 2024   06/02/12 1400   cefTAZidime (FORTAZ) 1 g in dextrose 5 % 50 mL IVPB        1 g 100 mL/hr over 30 Minutes Intravenous 3 times per day 06/02/12 1349 06/12/12 0642          Assessment/Plan: s/p Procedure(s) (LRB) with comments: PERCUTANEOUS TRACHEOSTOMY (N/A) PERCUTANEOUS ENDOSCOPIC GASTROSTOMY (PEG) PLACEMENT (N/A) Neuro: TBI with hemiparesis.  stable Pulm: vent wean per CCM, s/p tracheostomy.  Fevers likely due to pulmonary source.  With positive cultures would start antibiotics. vanc and cefipime as growing gram positive organisms Hemodynamically stable. Continue tube feeds.  Will need swallow study when on trach collar to evaluate for possible need for PEG but likely will need and I discussed the likelihood of this with the family today. DVT continue anticoagulation.  Will remove PICC when better access available or less medications administered.   LOS: 19 days    Alex Foster DAVID 06/19/2012
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
## 2012-06-20 ENCOUNTER — Inpatient Hospital Stay (HOSPITAL_COMMUNITY): Payer: Medicaid Other

## 2012-06-20 LAB — CBC
MCH: 28.2 pg (ref 26.0–34.0)
MCHC: 29.6 g/dL — ABNORMAL LOW (ref 30.0–36.0)
Platelets: 226 10*3/uL (ref 150–400)
RBC: 3.12 MIL/uL — ABNORMAL LOW (ref 4.22–5.81)

## 2012-06-20 LAB — GLUCOSE, CAPILLARY
Glucose-Capillary: 159 mg/dL — ABNORMAL HIGH (ref 70–99)
Glucose-Capillary: 168 mg/dL — ABNORMAL HIGH (ref 70–99)
Glucose-Capillary: 188 mg/dL — ABNORMAL HIGH (ref 70–99)
Glucose-Capillary: 199 mg/dL — ABNORMAL HIGH (ref 70–99)

## 2012-06-20 LAB — BASIC METABOLIC PANEL
BUN: 63 mg/dL — ABNORMAL HIGH (ref 6–23)
Calcium: 8 mg/dL — ABNORMAL LOW (ref 8.4–10.5)
GFR calc non Af Amer: 51 mL/min — ABNORMAL LOW (ref >90)
Glucose, Bld: 147 mg/dL — ABNORMAL HIGH (ref 70–99)
Sodium: 147 mEq/L — ABNORMAL HIGH (ref 135–145)

## 2012-06-20 MED ORDER — QUETIAPINE FUMARATE 100 MG PO TABS
100.0000 mg | ORAL_TABLET | Freq: Three times a day (TID) | ORAL | Status: DC
  Administered 2012-06-20 – 2012-06-23 (×12): 100 mg
  Filled 2012-06-20 (×17): qty 1

## 2012-06-20 MED ORDER — CLONAZEPAM 1 MG PO TABS
1.0000 mg | ORAL_TABLET | Freq: Three times a day (TID) | ORAL | Status: DC
  Administered 2012-06-20: 1 mg

## 2012-06-20 MED ORDER — CLONAZEPAM 0.1 MG/ML ORAL SUSPENSION: 1.0000 mg | Freq: Three times a day (TID) | ORAL | Status: DC

## 2012-06-20 NOTE — Clinical Social Work Note (Signed)
Clinical Social Worker continuing to follow patient for support and discharge needs.  Patient with trach but still remains on ventilator support.  Per PT/OT notes, patient may be appropriate for inpatient rehab once weaned from the ventilator.  No family present at bedside, but per previous conversation with patient brother he is agreeable with necessary discharge options for patient.  CSW will inquire with patient brother the ability to have 24 hour support once discharged.    Clinical Social Worker will continue to follow for support and facilitate patient discharge needs once medically stable.  Macario Golds, [place] [phone]

## 2012-06-20 NOTE — Progress Notes (Signed)
Pt discussed in hospital LOS meeting today. 
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
## 2012-06-20 NOTE — Plan of Care (Signed)
Problem: Phase I Progression Outcomes Goal: Tracheostomy by Vent Day 14 Outcome: Completed/Met Date Met:  06/20/12 Done at day 18

## 2012-06-20 NOTE — Progress Notes (Signed)
Pt. Temp of 103.3.  Ice packs applied, 650mg  tylenol given.  Temp down to 101.7.  Will continue to monitor pt.
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
## 2012-06-20 NOTE — Progress Notes (Signed)
Patient ID: Alex Foster, male   DOB: [birth year], 66 y.o.   MRN: [id number]   LOS: 20 days   Subjective: Sedated, agitated. No FC.  Objective: Vital signs in last 24 hours: Temp:  [100 F (37.8 C)-103 F (39.4 C)] 100 F (37.8 C) (09/19 0400) Pulse Rate:  [76-107] 91  (09/19 0700) Resp:  [16-38] 21  (09/19 0700) BP: (92-148)/(38-71) 115/71 mmHg (09/19 0700) SpO2:  [90 %-98 %] 94 % (09/19 0700) FiO2 (%):  [38.3 %-50 %] 48 % (09/19 0714) Weight:  [112.9 kg (248 lb 14.4 oz)] 112.9 kg (248 lb 14.4 oz) (09/19 0500) Last BM Date: 06/19/12  VENT: PRVC/50%/5PEEP/RR22/Vt620ml  UOP: 26ml/h NET: -1541ml/24h TOTAL: +5556ml/admission   Lab Results  CBC  Basename 06/20/12 0650 06/19/12 0525  WBC 12.2* 13.6*  HGB 8.8* 9.7*  HCT 29.7* 31.4*  PLT 226 245   BMET  Basename 06/20/12 0650 06/19/12 0525  NA 147* 154*  K 3.5 3.7  CL 116* 120*  CO2 22 25  GLUCOSE 147* 144*  BUN 63* 64*  CREATININE 1.41* 1.42*  CALCIUM 8.0* 8.1*   CBG (last 3)   Basename 06/20/12 0404 06/19/12 1956 06/19/12 1209  GLUCAP 160* 145* 100*   Lab Results  Component Value Date   INR 1.63* 06/20/2012   INR 1.62* 06/19/2012   INR 1.37 06/18/2012     Radiology PORTABLE CHEST - 1 VIEW  Comparison: 06/19/2012  Findings: Cardiomediastinal silhouette is unchanged. Stable  tracheostomy tube position. Stable NG tube position. Stable right  arm PICC line position. Persistent bilateral mid and lower lung  hazy airspace opacities right greater than left without change in  aeration. Bilateral rib deformities are stable.  IMPRESSION:  Stable tracheostomy tube position. Stable NG tube position.  Stable right arm PICC line position. Persistent bilateral mid and  lower lung hazy airspace opacities right greater than left without  change in aeration. Bilateral rib deformities are stable.  Original Report Authenticated By: Natasha Mead, M.D.   General appearance: mild distress and moderate distress Resp: clear  to auscultation bilaterally Cardio: Tachycardia GI: Soft, +BS. Pulses: 2+ and symmetric   ID Resp culture -- GPR, GPC  Cefepime/Vanc D#2   Assessment/Plan: [facility]  VDRF -- Appreciate CCM's help.  TBI w/ICC -- per NS.  CVA -- per NS  Multiple facial fxs -- Non-operative per ENT  Multiple bilateral rib fxs w/HTPX s/p bilateral CT  ABL anemia -- Stable  UE DVT -- Lovenox + coumadin. D/w NS who ok'd, watch for signs of ICH  ID -- Persistent high fevers, WBC decreased somewhat. Await culture results to narrow abx. ARF -- Stable Cards -- Cardiology following for arrhythmias  FEN -- Watch renal fxn. Na+ high but decreased today, will see if trend continues tomorrow. Add klonopin and seroquel, try and wean gtts. Dispo -- VDRF    Freeman Caldron, PA-C Pager: [phone] General Trauma PA Pager: [phone]   06/20/2012

## 2012-06-20 NOTE — Progress Notes (Signed)
Cont pulmonary support.

## 2012-06-21 ENCOUNTER — Inpatient Hospital Stay (HOSPITAL_COMMUNITY): Payer: Medicaid Other

## 2012-06-21 LAB — BASIC METABOLIC PANEL
BUN: 72 mg/dL — ABNORMAL HIGH (ref 6–23)
Calcium: 7.9 mg/dL — ABNORMAL LOW (ref 8.4–10.5)
Creatinine, Ser: 1.6 mg/dL — ABNORMAL HIGH (ref 0.50–1.35)
GFR calc Af Amer: 51 mL/min — ABNORMAL LOW (ref >90)

## 2012-06-21 LAB — CBC
MCHC: 30.5 g/dL (ref 30.0–36.0)
Platelets: 229 10*3/uL (ref 150–400)
RDW: 15.3 % (ref 11.5–15.5)
WBC: 9.5 10*3/uL (ref 4.0–10.5)

## 2012-06-21 LAB — CULTURE, RESPIRATORY W GRAM STAIN

## 2012-06-21 LAB — PROTIME-INR
INR: 1.34 (ref 0.00–1.49)
Prothrombin Time: 16.3 seconds — ABNORMAL HIGH (ref 11.6–15.2)

## 2012-06-21 MED ORDER — INSULIN GLARGINE 100 UNIT/ML ~~LOC~~ SOLN
35.0000 [IU] | Freq: Every day | SUBCUTANEOUS | Status: DC
  Administered 2012-06-21 – 2012-06-23 (×3): 35 [IU] via SUBCUTANEOUS

## 2012-06-21 MED ORDER — CLONAZEPAM 0.5 MG PO TABS
1.5000 mg | ORAL_TABLET | Freq: Three times a day (TID) | ORAL | Status: DC
  Administered 2012-06-21 – 2012-06-24 (×9): 1.5 mg
  Filled 2012-06-21 (×9): qty 1
  Filled 2012-06-21: qty 2
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
## 2012-06-21 NOTE — Progress Notes (Signed)
I was called by the nursing staff. The patient has been unable to urinate; bladder scans with 400cc; in/out catheterization performed three times.   Plan: reintroduce Foley catheter.

## 2012-06-21 NOTE — Progress Notes (Signed)
I saw the patient, participated in the history, exam and medical decision making, and concur with the physician assistant's note above.  Watch Cr. If unable to wean from vent will consider PEG next week  Alex Foster. Andrey Campanile, MD, FACS General, Bariatric, & Minimally Invasive Surgery [facility], [place]

## 2012-06-21 NOTE — Progress Notes (Signed)
ANTIBIOTIC & ANTICOAG CONSULT NOTE - FOLLOW UP  Pharmacy Consult for Vancomycin and Coumadin Indication: pneumonia coverage;  RUE DVT  No Known Allergies  Patient Measurements: Height: 6\' 3"  (190.5 cm) Weight: 246 lb 4.1 oz (111.7 kg) IBW/kg (Calculated) : 84.5   Vital Signs: Temp: 99.5 F (37.5 C) (09/20 0706) Temp src: Oral (09/20 0706) BP: 129/48 mmHg (09/20 1500) Pulse Rate: 114  (09/20 1500) Intake/Output from previous day: 09/19 0701 - 09/20 0700 In: 2831.9 [I.V.:1051.9; NG/GT:1180; IV Piggyback:600] Out: 2020 [Urine:1920; Stool:100] Intake/Output from this shift: Total I/O In: 1453 [I.V.:363; NG/GT:840; IV Piggyback:250] Out: 315 [Urine:315]  Labs:  [facility] 06/21/12 0420 06/20/12 0650 06/19/12 0525  WBC 9.5 12.2* 13.6*  HGB 8.0* 8.8* 9.7*  PLT 229 226 245  LABCREA -- -- --  CREATININE 1.60* 1.41* 1.42*   Estimated Creatinine Clearance: 62.9 ml/min (by C-G formula based on Cr of 1.6).  Assessment:   Day #3 Vancomycin and Cefepime.  Planning 7 days.   Tmax 99.5, WBC down to 9.5, but serum creatinine rising and I/O positive.    RUE DVT - currently on Lovenox 115 mg SQ q12hrs. CBC trending down.   Coumadin begun 9/16 with 5 mg x 1;  INR 1.63 on 9/19, but down to 1.34 today.  Coumadin on hold until decision made about PEG tube placement, which may be considered next week if unable to wean from vent.  Goal of Therapy:  Vancomycin trough level 15-20 mcg/ml INR 2-3, when Coumadin resumes  Plan:   Continue Vancomycin 1 gram IV q12hrs.  Will check trough level prior to 10am dose on 9/21.  Follow-up renal function and clinical course.  Follow-up for Coumadin restart.   Follow-up CBC with you.  Dennie Fetters, [place] Pager: [phone] 06/21/2012,3:24 PM
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
## 2012-06-21 NOTE — Progress Notes (Signed)
Physical Therapy Treatment Patient Details Name: Alex Foster MRN: [id number] DOB: Jul 20, 1946 Today's Date: 06/21/2012 Time: 0865-7846 PT Time Calculation (min): 27 min  PT Assessment / Plan / Recommendation Comments on Treatment Session  Patient with slow improvements re: attention/arousal, following commands.    Follow Up Recommendations  Inpatient Rehab    Barriers to Discharge        Equipment Recommendations  Defer to next venue    Recommendations for Other Services    Frequency Min 3X/week   Plan Discharge plan remains appropriate;Frequency remains appropriate    Precautions / Restrictions Precautions Precautions: Cervical Required Braces or Orthoses: Cervical Brace Cervical Brace: Hard collar;Applied in supine position Restrictions Weight Bearing Restrictions: No       Mobility  Bed Mobility Bed Mobility: Rolling Right;Rolling Left;Scooting to HOB Rolling Right: 1: +2 Total assist Rolling Right: Patient Percentage: 10% Rolling Left: 1: +2 Total assist Rolling Left: Patient Percentage: 10% Scooting to HOB: 1: +2 Total assist Scooting to [facility]: Patient Percentage: 0% Details for Bed Mobility Assistance:  (Verbal and tactile cues.) Transfers Transfers: Not assessed Modified Rankin (Stroke Patients Only) Pre-Morbid Rankin Score: No symptoms Modified Rankin: Severe disability      PT Goals Acute Rehab PT Goals PT Goal: Supine/Side to Sit - Progress: Progressing toward goal  Visit Information  Last PT Received On: 06/21/12 Assistance Needed: +2 PT/OT Co-Evaluation/Treatment: Yes (with SLP)    Subjective Data  Subjective: Non-verbal on trach.  Attempts to mouth words.   Cognition  Overall Cognitive Status: Impaired Area of Impairment: Attention;Following commands (difficult to assess due to communication) Arousal/Alertness: Lethargic Orientation Level:  (Difficult to assess due to communication.  Responds to name.) Behavior During Session: Restless  (Lethargic) Current Attention Level: Focused Attention - Other Comments: Opened eyes and attended x 5-6 seconds Following Commands: Follows one step commands inconsistently;Follows one step commands with increased time    Balance     End of Session PT - End of Session Activity Tolerance: Patient limited by fatigue;Treatment limited secondary to medical complications (Comment) Patient left: in bed;with call bell/phone within reach;with nursing in room;with bed alarm set Nurse Communication: Need for lift equipment;Mobility status   GP     Alex Foster 06/21/2012, 3:14 PM [facility]. [facility], [facility] Acute Rehab Services Pager [phone]

## 2012-06-21 NOTE — Progress Notes (Signed)
Patient thrashing about in bed, popped self of vent. Fentanyl and versed drips restarted. See MAR.

## 2012-06-21 NOTE — Progress Notes (Addendum)
Speech Language Pathology Treatment Patient Details Name: Alex Foster MRN: [id number] DOB: 07-Sep-1946 Today's Date: 06/21/2012 Time: 1040-1110 SLP Time Calculation (min): 30 min  Assessment / Plan / Recommendation Clinical Impression  Pt. seen with PT for cognitve facilitation.  Pt. with increased lethary during session due to sedation meds.  He demonstrated sustained attention to therapist and activity (oral hygiene, face washing) with 3-5 seconds.  Max-total verbal/visual/tactile cues required throughout session.  Pt. follwed basic one step commands with approximately  40%.  Pt. exhibited  responses to yes/no questions with head gesture during informal/automatic questioning but was unable to respond when specific questions yes/no head gesture was targeted (likely due to lethargy).  Pt. Observed to mouth "what" once with increased attempts to mouth words today.  Pt. would continue to benefit from ST services to facilitate cognitive-linguistic communication.  ould be PMVS candidate when able to wean from vent.     SLP Plan  Continue with current plan of care       SLP Goals   Pt. will sustain attention to activity/speaker for 15 minutes with mod verbal cues. SLP Goal #1 - Progress Progressing toward goal SLP  Goal #2 Pt. will follow 1 step commands with 90% accuracy. SLP Goal #2 - Progress Progressing toward goal SLP Goal #3 Pt. will increase spatial and situational orientation via yes/no head gestures with 75% with mod environmental cues. SLP Goal #3 - Progress Progressing toward goal   General Respiratory Status: Ventilator (trach on vent, attempting weaning trials) Behavior/Cognition: Lethargic;Requires cueing;Decreased sustained attention;Cooperative;Confused Oral Cavity - Dentition: Edentulous Patient Positioning: Partially reclined  Oral Cavity - Oral Hygiene Does patient have any of the following "at risk" factors?: Nutritional status - inadequate Brush patient's teeth BID with  toothbrush (using toothpaste with fluoride): Yes   Treatment Treatment focused on: Cognition Skilled Treatment: max-total verba/ visual/ tactile cues during functional activities (see impression statement)        Darrow Bussing.Ed [employer] [phone]  06/21/2012

## 2012-06-22 ENCOUNTER — Inpatient Hospital Stay (HOSPITAL_COMMUNITY): Payer: Medicaid Other

## 2012-06-22 LAB — BASIC METABOLIC PANEL
BUN: 73 mg/dL — ABNORMAL HIGH (ref 6–23)
CO2: 22 mEq/L (ref 19–32)
Calcium: 7.5 mg/dL — ABNORMAL LOW (ref 8.4–10.5)
Chloride: 116 mEq/L — ABNORMAL HIGH (ref 96–112)
Creatinine, Ser: 1.68 mg/dL — ABNORMAL HIGH (ref 0.50–1.35)
GFR calc Af Amer: 49 mL/min — ABNORMAL LOW (ref >90)
Glucose, Bld: 185 mg/dL — ABNORMAL HIGH (ref 70–99)
Glucose, Bld: 202 mg/dL — ABNORMAL HIGH (ref 70–99)
Potassium: 3 mEq/L — ABNORMAL LOW (ref 3.5–5.1)
Sodium: 148 mEq/L — ABNORMAL HIGH (ref 135–145)

## 2012-06-22 LAB — CBC
HCT: 23.1 % — ABNORMAL LOW (ref 39.0–52.0)
Hemoglobin: 7.1 g/dL — ABNORMAL LOW (ref 13.0–17.0)
RDW: 15.5 % (ref 11.5–15.5)
WBC: 7.6 10*3/uL (ref 4.0–10.5)

## 2012-06-22 LAB — URINALYSIS, ROUTINE W REFLEX MICROSCOPIC
Glucose, UA: NEGATIVE mg/dL
Ketones, ur: NEGATIVE mg/dL
Protein, ur: 100 mg/dL — AB
pH: 6 (ref 5.0–8.0)

## 2012-06-22 LAB — GLUCOSE, CAPILLARY
Glucose-Capillary: 200 mg/dL — ABNORMAL HIGH (ref 70–99)
Glucose-Capillary: 184 mg/dL — ABNORMAL HIGH (ref 70–99)

## 2012-06-22 LAB — VANCOMYCIN, TROUGH: Vancomycin Tr: 25.5 ug/mL (ref 10.0–20.0)

## 2012-06-22 LAB — URINE MICROSCOPIC-ADD ON

## 2012-06-22 LAB — PROTIME-INR: INR: 1.33 (ref 0.00–1.49)

## 2012-06-22 MED ORDER — LACTATED RINGERS IV BOLUS (SEPSIS)
500.0000 mL | Freq: Once | INTRAVENOUS | Status: AC
  Administered 2012-06-22: 500 mL via INTRAVENOUS

## 2012-06-22 MED ORDER — POTASSIUM CHLORIDE 10 MEQ/100ML IV SOLN: 10.0000 meq | INTRAVENOUS | Status: DC

## 2012-06-22 MED ORDER — POTASSIUM CHLORIDE 10 MEQ/50ML IV SOLN
10.0000 meq | INTRAVENOUS | Status: AC
  Administered 2012-06-22 (×4): 10 meq via INTRAVENOUS
  Filled 2012-06-22: qty 200

## 2012-06-22 MED ORDER — VANCOMYCIN HCL 1000 MG IV SOLR
1500.0000 mg | INTRAVENOUS | Status: DC
  Administered 2012-06-22 – 2012-06-24 (×2): 1500 mg via INTRAVENOUS
  Filled 2012-06-22 (×3): qty 1500

## 2012-06-22 MED ORDER — WARFARIN SODIUM 5 MG PO TABS
5.0000 mg | ORAL_TABLET | Freq: Once | ORAL | Status: AC
  Administered 2012-06-22: 5 mg via ORAL
  Filled 2012-06-22: qty 1

## 2012-06-22 MED ORDER — FREE WATER
400.0000 mL | Freq: Four times a day (QID) | Status: DC
  Administered 2012-06-23 – 2012-06-27 (×18): 400 mL

## 2012-06-22 MED ORDER — SODIUM CHLORIDE 0.9 % IV BOLUS (SEPSIS): 500.0000 mL | Freq: Once | INTRAVENOUS | Status: DC

## 2012-06-22 MED ORDER — FLUCONAZOLE IN SODIUM CHLORIDE 200-0.9 MG/100ML-% IV SOLN
200.0000 mg | Freq: Once | INTRAVENOUS | Status: AC
  Administered 2012-06-22: 200 mg via INTRAVENOUS
  Filled 2012-06-22: qty 100

## 2012-06-22 MED ORDER — FLUCONAZOLE 100MG IVPB
100.0000 mg | INTRAVENOUS | Status: DC
  Administered 2012-06-23 – 2012-06-30 (×8): 100 mg via INTRAVENOUS
  Filled 2012-06-22 (×10): qty 50

## 2012-06-22 MED ORDER — WARFARIN - PHARMACIST DOSING INPATIENT
Freq: Every day | Status: DC
  Administered 2012-06-29 – 2012-06-30 (×2)

## 2012-06-22 MED ORDER — PHENYLEPHRINE HCL 10 MG/ML IJ SOLN
10.0000 ug/min | INTRAVENOUS | Status: DC
  Administered 2012-06-22: 10 ug/min via INTRAVENOUS
  Administered 2012-06-23 (×3): 20 ug/min via INTRAVENOUS
  Administered 2012-06-24 (×2): 25 ug/min via INTRAVENOUS
  Administered 2012-06-25: 20 ug/min via INTRAVENOUS
  Administered 2012-06-25: 60 ug/min via INTRAVENOUS
  Administered 2012-06-25: 3 ug/min via INTRAVENOUS
  Administered 2012-06-26: 40 ug/min via INTRAVENOUS
  Administered 2012-06-26 (×2): 20 ug/min via INTRAVENOUS
  Administered 2012-06-26: 30 ug/min via INTRAVENOUS
  Administered 2012-06-26: 20 ug/min via INTRAVENOUS
  Administered 2012-06-27: 30 ug/min via INTRAVENOUS
  Filled 2012-06-22 (×17): qty 1

## 2012-06-22 NOTE — Progress Notes (Signed)
Patient able to urinate without foley and in/out cath.  At 22:00 bladder scan showed 400cc in bladder.  [facility]link called for order foley.  At 2300 patient found to have urinated 350cc through Condom cath at 01:00 patient had another 120 of urine.  RN will hold foley at this time and continue to assess.

## 2012-06-22 NOTE — Progress Notes (Signed)
Hypotension   Saline Bolus given.
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
## 2012-06-22 NOTE — Progress Notes (Signed)
Patient's BP dropped into the 80s systolically. Dr. Donell Beers contacted concerning trend. Was informed that orders will be placed for treatment and parameters. Will continue to monitor.

## 2012-06-22 NOTE — Progress Notes (Signed)
ANTIBIOTIC & ANTICOAG CONSULT NOTE - FOLLOW UP  Pharmacy Consult for Vancomycin and Coumadin Indication: pneumonia coverage;  RUE DVT  No Known Allergies  Patient Measurements: Height: 6\' 3"  (190.5 cm) Weight: 246 lb 4.1 oz (111.7 kg) IBW/kg (Calculated) : 84.5   Vital Signs: Temp: 98.5 F (36.9 C) (09/21 0400) Temp src: Oral (09/21 0400) BP: 95/49 mmHg (09/21 0741) Pulse Rate: 86  (09/21 0741) Intake/Output from previous day: 09/20 0701 - 09/21 0700 In: 4238.5 [I.V.:1048.5; NG/GT:2140; IV Piggyback:1050] Out: 2365 [Urine:1915; Stool:450] Intake/Output from this shift:    Labs:  Basename 06/22/12 0421 06/21/12 0420 06/20/12 0650  WBC 7.6 9.5 12.2*  HGB 7.1* 8.0* 8.8*  PLT 192 229 226  LABCREA -- -- --  CREATININE 1.65* 1.60* 1.41*   Estimated Creatinine Clearance: 61 ml/min (by C-G formula based on Cr of 1.65).  Assessment: 66 yom continues Vancomycin + Cefepime D#4 for ?PNA. Planning for 7 days. Tmax is 102.4, WBC WNL. Vancomycin trough today is supratherapeutic at 25.5. Scr is also trending up slightly.  Pt also continues on lovenox per MD for RUE DVT. MD indicated via sticky note to restart coumadin. INR 1.33, no bleeding noted but slight trend down in CBC.   Goal of Therapy:  Vancomycin trough = 15-20 INR = 2-3  Plan:  1. Change vancomycin to 1500mg  IV Q24H 2. F/u clinical status, renal fxn and re-check a trough at SS 3. Coumadin 5mg  PO x 1 tonight 4. Continue daily INR 5. Will need to watch CBC closely along with S/S of bleeding and re-evaluate anticoagulation if needed  Lysle Pearl, PharmD, BCPS Pager # [phone] 06/22/2012 11:49 AM

## 2012-06-22 NOTE — Progress Notes (Signed)
CRITICAL VALUE ALERT  Critical value received:  Vancomycin through 25.5  Date of notification: 06/22/12  Time of notification:  1115  Critical value read back:yes  Nurse who received alert:  J.Daleen Squibb, RN   MD notified (1st page):  Trauma  Time of first page:  1130  MD notified (2nd page):Dr. Donell Beers   Time of second page:1145  Responding MD:  Dr. Donell Beers  Time MD responded:  1200
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
## 2012-06-23 DIAGNOSIS — S2249XA Multiple fractures of ribs, unspecified side, initial encounter for closed fracture: Secondary | ICD-10-CM

## 2012-06-23 DIAGNOSIS — J95821 Acute postprocedural respiratory failure: Secondary | ICD-10-CM

## 2012-06-23 DIAGNOSIS — S069X9A Unspecified intracranial injury with loss of consciousness of unspecified duration, initial encounter: Secondary | ICD-10-CM

## 2012-06-23 DIAGNOSIS — J189 Pneumonia, unspecified organism: Secondary | ICD-10-CM

## 2012-06-23 LAB — CULTURE, BLOOD (ROUTINE X 2): Culture: NO GROWTH

## 2012-06-23 LAB — BASIC METABOLIC PANEL
BUN: 71 mg/dL — ABNORMAL HIGH (ref 6–23)
Calcium: 7.9 mg/dL — ABNORMAL LOW (ref 8.4–10.5)
Creatinine, Ser: 1.64 mg/dL — ABNORMAL HIGH (ref 0.50–1.35)
GFR calc non Af Amer: 43 mL/min — ABNORMAL LOW (ref >90)
Glucose, Bld: 255 mg/dL — ABNORMAL HIGH (ref 70–99)

## 2012-06-23 LAB — GLUCOSE, CAPILLARY
Glucose-Capillary: 175 mg/dL — ABNORMAL HIGH (ref 70–99)
Glucose-Capillary: 234 mg/dL — ABNORMAL HIGH (ref 70–99)
Glucose-Capillary: 220 mg/dL — ABNORMAL HIGH (ref 70–99)

## 2012-06-23 LAB — CBC
HCT: 24 % — ABNORMAL LOW (ref 39.0–52.0)
Hemoglobin: 7.4 g/dL — ABNORMAL LOW (ref 13.0–17.0)
MCH: 29 pg (ref 26.0–34.0)
MCHC: 30.8 g/dL (ref 30.0–36.0)
MCV: 94.1 fL (ref 78.0–100.0)

## 2012-06-23 MED ORDER — WARFARIN SODIUM 5 MG PO TABS
5.0000 mg | ORAL_TABLET | Freq: Once | ORAL | Status: AC
  Administered 2012-06-23: 5 mg via ORAL
  Filled 2012-06-23: qty 1

## 2012-06-23 NOTE — Progress Notes (Signed)
ANTICOAGULATION CONSULT NOTE - FOLLOW UP  Pharmacy Consult for Coumadin Indication: RUE DVT  No Known Allergies  Patient Measurements: Height: 6\' 3"  (190.5 cm) Weight: 257 lb 4.4 oz (116.7 kg) IBW/kg (Calculated) : 84.5   Vital Signs: Temp: 99.4 F (37.4 C) (09/22 1200) Temp src: Oral (09/22 1200) BP: 101/41 mmHg (09/22 1156) Pulse Rate: 84  (09/22 1156) Intake/Output from previous day: 09/21 0701 - 09/22 0700 In: 2720 [I.V.:1320; NG/GT:550; IV Piggyback:850] Out: 1855 [Urine:1570; Stool:285] Intake/Output from this shift: Total I/O In: 865 [I.V.:285; NG/GT:530; IV Piggyback:50] Out: 585 [Urine:585]  Labs:  [facility] 06/23/12 0617 06/22/12 1629 06/22/12 0421 06/21/12 0420  WBC 9.6 -- 7.6 9.5  HGB 7.4* -- 7.1* 8.0*  PLT 256 -- 192 229  LABCREA -- -- -- --  CREATININE 1.64* 1.68* 1.65* --   Estimated Creatinine Clearance: 62.7 ml/min (by C-G formula based on Cr of 1.64).  Assessment: 66 yom continues on lovenox per MD for RUE DVT. MD indicated via sticky note to restart coumadin. INR 1.37, no bleeding noted. H/H is low but stable  Goal of Therapy:  INR = 2-3  Plan:  1. Coumadin 5mg  PO x 1 tonight 2. Continue daily INR 5. Will need to watch CBC closely along with S/S of bleeding and re-evaluate anticoagulation if needed  Lysle Pearl, PharmD, BCPS Pager # [phone] 06/23/2012 12:49 PM
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
## 2012-06-24 LAB — GLUCOSE, CAPILLARY
Glucose-Capillary: 162 mg/dL — ABNORMAL HIGH (ref 70–99)
Glucose-Capillary: 179 mg/dL — ABNORMAL HIGH (ref 70–99)
Glucose-Capillary: 195 mg/dL — ABNORMAL HIGH (ref 70–99)
Glucose-Capillary: 220 mg/dL — ABNORMAL HIGH (ref 70–99)

## 2012-06-24 LAB — BASIC METABOLIC PANEL
CO2: 23 mEq/L (ref 19–32)
Calcium: 7.8 mg/dL — ABNORMAL LOW (ref 8.4–10.5)
Chloride: 114 mEq/L — ABNORMAL HIGH (ref 96–112)
Creatinine, Ser: 1.55 mg/dL — ABNORMAL HIGH (ref 0.50–1.35)
Glucose, Bld: 212 mg/dL — ABNORMAL HIGH (ref 70–99)
Sodium: 145 mEq/L (ref 135–145)

## 2012-06-24 LAB — CBC
Hemoglobin: 6.9 g/dL — CL (ref 13.0–17.0)
MCH: 28.6 pg (ref 26.0–34.0)
MCV: 92.5 fL (ref 78.0–100.0)
RBC: 2.41 MIL/uL — ABNORMAL LOW (ref 4.22–5.81)

## 2012-06-24 LAB — URINE CULTURE: Colony Count: NO GROWTH

## 2012-06-24 MED ORDER — CLONAZEPAM 1 MG PO TABS
2.0000 mg | ORAL_TABLET | Freq: Three times a day (TID) | ORAL | Status: DC
  Administered 2012-06-24 – 2012-06-26 (×6): 2 mg
  Filled 2012-06-24: qty 2
  Filled 2012-06-24: qty 1
  Filled 2012-06-24 (×3): qty 2
  Filled 2012-06-24: qty 1
  Filled 2012-06-24 (×2): qty 2

## 2012-06-24 MED ORDER — QUETIAPINE FUMARATE 200 MG PO TABS
200.0000 mg | ORAL_TABLET | Freq: Three times a day (TID) | ORAL | Status: DC
  Administered 2012-06-24 – 2012-06-26 (×6): 200 mg
  Filled 2012-06-24 (×9): qty 1

## 2012-06-24 MED ORDER — LEVOFLOXACIN IN D5W 750 MG/150ML IV SOLN
750.0000 mg | INTRAVENOUS | Status: AC
  Administered 2012-06-24 – 2012-07-07 (×14): 750 mg via INTRAVENOUS
  Filled 2012-06-24 (×14): qty 150

## 2012-06-24 MED ORDER — POTASSIUM CHLORIDE 10 MEQ/50ML IV SOLN
10.0000 meq | INTRAVENOUS | Status: AC
  Administered 2012-06-24 (×2): 10 meq via INTRAVENOUS
  Filled 2012-06-24 (×2): qty 50

## 2012-06-24 MED ORDER — INSULIN GLARGINE 100 UNIT/ML ~~LOC~~ SOLN
45.0000 [IU] | Freq: Every day | SUBCUTANEOUS | Status: DC
  Administered 2012-06-24 – 2012-07-19 (×26): 45 [IU] via SUBCUTANEOUS
  Administered 2012-07-20 – 2012-07-21 (×2): via SUBCUTANEOUS
  Administered 2012-07-22 – 2012-07-29 (×8): 45 [IU] via SUBCUTANEOUS

## 2012-06-24 MED ORDER — WARFARIN SODIUM 5 MG PO TABS
5.0000 mg | ORAL_TABLET | Freq: Once | ORAL | Status: AC
  Administered 2012-06-24: 5 mg via ORAL
  Filled 2012-06-24: qty 1

## 2012-06-24 NOTE — Progress Notes (Signed)
ANTICOAGULATION CONSULT NOTE - FOLLOW UP  Pharmacy Consult for Coumadin Indication: RUE DVT  No Known Allergies  Patient Measurements: Height: 6\' 3"  (190.5 cm) Weight: 262 lb 9.1 oz (119.1 kg) IBW/kg (Calculated) : 84.5   Vital Signs: Temp: 99.9 F (37.7 C) (09/23 0745) Temp src: Axillary (09/23 0745) BP: 107/45 mmHg (09/23 0745) Pulse Rate: 86  (09/23 0745) Intake/Output from previous day: 09/22 0701 - 09/23 0700 In: 2482.5 [I.V.:1472.5; NG/GT:910; IV Piggyback:100] Out: 2200 [Urine:2100; Stool:100] Intake/Output from this shift: Total I/O In: 0  Out: 175 [Urine:175]  Labs:  [facility] 06/23/12 0617 06/22/12 1629 06/22/12 0421  WBC 9.6 -- 7.6  HGB 7.4* -- 7.1*  PLT 256 -- 192  LABCREA -- -- --  CREATININE 1.64* 1.68* 1.65*   Estimated Creatinine Clearance: 63.3 ml/min (by C-G formula based on Cr of 1.64).  Assessment: 66 yom continues on lovenox per MD for RUE DVT. MD indicated via sticky note to restart coumadin. INR 1.37, no bleeding noted. H/H is low but stable  Goal of Therapy:  INR = 2-3  Plan:  1. Coumadin 5mg  PO x 1 tonight 2. Continue daily INR 5. Will need to watch CBC closely along with S/S of bleeding and re-evaluate anticoagulation if needed  Talbert Cage, PharmD Pager # [phone] 06/24/2012 8:39 AM

## 2012-06-24 NOTE — Clinical Social Work Note (Signed)
Clinical Social Worker continuing to follow patient for support and discharge needs.  Patient continues on ventilator support and has now started a neo drip.  Patient remains unstable and continues ICU level of care.  CSW will continue to follow for support as needed.  No family present at bedside and patient sleeping upon CSW visit.  Macario Golds, [place] [phone]

## 2012-06-24 NOTE — Progress Notes (Addendum)
Pt telemetry strip @1911  showed new A-fib, current rhythm on monitor shows 1st degree heart block which is pt's baseline. Luevenia Maxin MD notified; ordered BMET and magnesium level. Labs will be drawn after blood transfusion is complete.

## 2012-06-24 NOTE — Progress Notes (Signed)
Pt presented with a tremor in his left leg that lasted for approximately 20 seconds. MD was notified, no new orders were given.

## 2012-06-24 NOTE — Progress Notes (Signed)
CRITICAL VALUE ALERT  Critical value received:  Hemoglobin 6.9  Date of notification:  06/24/2012  Time of notification: 10:48  Critical value read back:yes  Nurse who received alert:  B. Marjo Bicker, RN  MD notified (1st page):  Dr. Janee Morn  Time of first page:  10:48  MD notified (2nd page):  Time of second page:  Responding MD:  Dr. Janee Morn  Time MD responded:  10:50

## 2012-06-24 NOTE — Progress Notes (Addendum)
Post transfusion follow up Hbg 7, Hct 22.8. MD paged. Orders received for 1 more unit of PRBC's, with follow up labs in AM.

## 2012-06-24 NOTE — Progress Notes (Signed)
Inpatient Diabetes Program Recommendations  AACE/ADA: New Consensus Statement on Inpatient Glycemic Control (2013)  Target Ranges:  Prepandial:   less than 140 mg/dL      Peak postprandial:   less than 180 mg/dL (1-2 hours)      Critically ill patients:  140 - 180 mg/dL   Caution against increasing Lantus in case tube feeds are interrupted or held.    Inpatient Diabetes Program Recommendations Insulin - Meal Coverage: Add Novolog 4 units Q 4 for tube feed coverage  Can titrate Lantus down once tube feed coverage is added.  Thank you  Piedad Climes [facility] Inpatient Diabetes Coordinator [phone]
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
## 2012-06-24 NOTE — Progress Notes (Signed)
Pt currently receiving unit of PRBC, temperature @1945  was 99.5, temperature @2045  was 100.4. No other signs of reaction present. MD notified, no new orders at this time. Will continue to monitor.

## 2012-06-24 NOTE — Progress Notes (Addendum)
Patient ID: Alex Foster, male   DOB: [birth year], 66 y.o.   MRN: [id number] Follow up - Trauma Critical Care  Patient Details:    Alex Foster is an 66 y.o. male.  Lines/tubes : PICC Triple Lumen 06/04/12 PICC Right Basilic (Active)  Indication for Insertion or Continuance of Line Prolonged intravenous therapies 06/24/2012  7:00 AM  Length mark (cm) 0 cm 06/04/2012  8:35 AM  Site Assessment Clean;Dry;Intact 06/24/2012  7:00 AM  Lumen #1 Status Infusing 06/24/2012  7:00 AM  Lumen #2 Status Infusing 06/24/2012  7:00 AM  Lumen #3 Status Infusing 06/24/2012  7:00 AM  Dressing Type Transparent;Occlusive 06/24/2012  7:00 AM  Dressing Status Clean;Dry;Intact;Antimicrobial disc in place 06/24/2012  7:00 AM  Line Care Connections checked and tightened 06/24/2012  7:00 AM  Dressing Intervention New dressing;Antimicrobial disc changed 06/20/2012  4:00 AM  Dressing Change Due 06/26/12 06/24/2012  7:00 AM     NG/OG Tube Nasogastric Left nare (Active)  Placement Verification Auscultation 06/24/2012  4:00 AM  Site Assessment Clean;Dry;Intact 06/23/2012  8:00 PM  Status Infusing tube feed 06/24/2012  4:00 AM  Drainage Appearance Tan 06/23/2012 12:00 PM  Gastric Residual 40 mL 06/24/2012  4:00 AM  Intake (mL) 20 mL 06/24/2012  7:00 AM     Rectal Tube/Pouch (Active)  Output (mL) 100 mL 06/24/2012  5:37 AM     Urethral Catheter Straight-tip 14 Fr. (Active)  Site Assessment Clean;Intact;Swelling 06/23/2012  8:00 PM  Collection Container Standard drainage bag 06/23/2012  8:00 PM  Securement Method Leg strap 06/23/2012  8:00 PM  Urinary Catheter Interventions Unclamped 06/23/2012  8:00 PM     External Urinary Catheter (Active)  Collection Container Standard drainage bag 06/23/2012  8:00 AM  Securement Method Leg strap 06/23/2012  8:00 AM  Output (mL) 135 mL 06/22/2012  6:00 PM    Microbiology/Sepsis markers: Results for orders placed during the hospital encounter of 05/31/12  MRSA PCR SCREENING     Status: Abnormal    Collection Time   05/31/12  8:58 PM      Component Value Range Status Comment   MRSA by PCR INVALID RESULTS, SPECIMEN SENT FOR CULTURE (*) NEGATIVE Final   MRSA CULTURE     Status: Normal   Collection Time   05/31/12  8:58 PM      Component Value Range Status Comment   Specimen Description NOSE   Final    Special Requests NONE   Final    Culture     Final    Value: NO STAPHYLOCOCCUS AUREUS ISOLATED     Note: NO MRSA ISOLATED   Report Status 06/03/2012 FINAL   Final   CULTURE, RESPIRATORY     Status: Normal   Collection Time   06/02/12  4:07 PM      Component Value Range Status Comment   Specimen Description BRONCHIAL ALVEOLAR LAVAGE   Final    Special Requests NONE   Final    Gram Stain     Final    Value: ABUNDANT WBC PRESENT, PREDOMINANTLY PMN     NO SQUAMOUS EPITHELIAL CELLS SEEN     MODERATE GRAM POSITIVE COCCI     IN PAIRS RARE GRAM NEGATIVE RODS   Culture     Final    Value: MODERATE HAEMOPHILUS PARAINFLUENZAE     Note: BETA LACTAMASE NEGATIVE   Report Status 06/05/2012 FINAL   Final   CLOSTRIDIUM DIFFICILE BY PCR     Status: Normal   Collection Time   06/13/12  8:05 AM      Component Value Range Status Comment   C difficile by pcr NEGATIVE  NEGATIVE Final   URINE CULTURE     Status: Normal   Collection Time   06/16/12  9:00 PM      Component Value Range Status Comment   Specimen Description URINE, CLEAN CATCH   Final    Special Requests NONE   Final    Culture  Setup Time 06/16/2012 21:37   Final    Colony Count NO GROWTH   Final    Culture NO GROWTH   Final    Report Status 06/18/2012 FINAL   Final   CULTURE, BLOOD (ROUTINE X 2)     Status: Normal   Collection Time   06/16/12  9:02 PM      Component Value Range Status Comment   Specimen Description BLOOD RIGHT FOOT   Final    Special Requests BOTTLES DRAWN AEROBIC ONLY 10CC   Final    Culture  Setup Time 06/17/2012 14:17   Final    Culture NO GROWTH 5 DAYS   Final    Report Status 06/23/2012 FINAL   Final     CULTURE, BLOOD (ROUTINE X 2)     Status: Normal   Collection Time   06/16/12  9:07 PM      Component Value Range Status Comment   Specimen Description BLOOD LEFT FOOT   Final    Special Requests BOTTLES DRAWN AEROBIC AND ANAEROBIC 10CC EACH   Final    Culture  Setup Time 06/17/2012 14:17   Final    Culture NO GROWTH 5 DAYS   Final    Report Status 06/23/2012 FINAL   Final   CULTURE, RESPIRATORY     Status: Normal   Collection Time   06/18/12 11:41 PM      Component Value Range Status Comment   Specimen Description TRACHEAL ASPIRATE   Final    Special Requests NONE   Final    Gram Stain     Final    Value: ABUNDANT WBC PRESENT, PREDOMINANTLY PMN     RARE SQUAMOUS EPITHELIAL CELLS PRESENT     FEW GRAM POSITIVE COCCI IN CLUSTERS     RARE GRAM POSITIVE RODS     RARE GRAM POSITIVE COCCI IN CHAINS   Culture Non-Pathogenic Oropharyngeal-type Flora Isolated.   Final    Report Status 06/21/2012 FINAL   Final   CULTURE, BLOOD (ROUTINE X 2)     Status: Normal (Preliminary result)   Collection Time   06/22/12  3:28 AM      Component Value Range Status Comment   Specimen Description BLOOD RIGHT ARM   Final    Special Requests BOTTLES DRAWN AEROBIC AND ANAEROBIC 10CC EACH   Final    Culture  Setup Time 06/22/2012 11:37   Final    Culture     Final    Value:        BLOOD CULTURE RECEIVED NO GROWTH TO DATE CULTURE WILL BE HELD FOR 5 DAYS BEFORE ISSUING A FINAL NEGATIVE REPORT   Report Status PENDING   Incomplete   CULTURE, BLOOD (ROUTINE X 2)     Status: Normal (Preliminary result)   Collection Time   06/22/12  3:35 AM      Component Value Range Status Comment   Specimen Description BLOOD RIGHT HAND   Final    Special Requests BOTTLES DRAWN AEROBIC AND ANAEROBIC 10CC EACH   Final  Culture  Setup Time 06/22/2012 11:37   Final    Culture     Final    Value:        BLOOD CULTURE RECEIVED NO GROWTH TO DATE CULTURE WILL BE HELD FOR 5 DAYS BEFORE ISSUING A FINAL NEGATIVE REPORT   Report Status  PENDING   Incomplete   CULTURE, RESPIRATORY     Status: Normal (Preliminary result)   Collection Time   06/22/12  7:49 AM      Component Value Range Status Comment   Specimen Description TRACHEAL ASPIRATE   Final    Special Requests NONE   Final    Gram Stain PENDING   Incomplete    Culture MODERATE GRAM NEGATIVE RODS   Final    Report Status PENDING   Incomplete     Anti-infectives:  Anti-infectives     Start     Dose/Rate Route Frequency Ordered Stop   06/23/12 1000   fluconazole (DIFLUCAN) IVPB 100 mg        100 mg 50 mL/hr over 60 Minutes Intravenous Every 24 hours 06/22/12 1632     06/23/12 0000   vancomycin (VANCOCIN) 1,500 mg in sodium chloride 0.9 % 500 mL IVPB        1,500 mg 250 mL/hr over 120 Minutes Intravenous Every 24 hours 06/22/12 1145     06/22/12 1730   fluconazole (DIFLUCAN) IVPB 200 mg        200 mg 100 mL/hr over 60 Minutes Intravenous  Once 06/22/12 1630 06/22/12 1852   06/19/12 2200   vancomycin (VANCOCIN) IVPB 1000 mg/200 mL premix  Status:  Discontinued        1,000 mg 200 mL/hr over 60 Minutes Intravenous Every 12 hours 06/19/12 0940 06/22/12 1123   06/19/12 1000   vancomycin (VANCOCIN) 2,000 mg in sodium chloride 0.9 % 500 mL IVPB        2,000 mg 250 mL/hr over 120 Minutes Intravenous  Once 06/19/12 0940 06/19/12 1300   06/19/12 0930   ceFEPIme (MAXIPIME) 2 g in dextrose 5 % 50 mL IVPB        2 g 100 mL/hr over 30 Minutes Intravenous 3 times per day 06/19/12 0904     06/04/12 1000   vancomycin (VANCOCIN) IVPB 1000 mg/200 mL premix  Status:  Discontinued        1,000 mg 200 mL/hr over 60 Minutes Intravenous Every 12 hours 06/04/12 0837 06/05/12 1045   06/03/12 1800   vancomycin (VANCOCIN) 1,500 mg in sodium chloride 0.9 % 500 mL IVPB  Status:  Discontinued        1,500 mg 250 mL/hr over 120 Minutes Intravenous Every 24 hours 06/02/12 1428 06/04/12 0837   06/02/12 1800   vancomycin (VANCOCIN) 2,000 mg in sodium chloride 0.9 % 500 mL IVPB         2,000 mg 250 mL/hr over 120 Minutes Intravenous  Once 06/02/12 1428 06/02/12 2024   06/02/12 1400   cefTAZidime (FORTAZ) 1 g in dextrose 5 % 50 mL IVPB        1 g 100 mL/hr over 30 Minutes Intravenous 3 times per day 06/02/12 1349 06/12/12 1610          Best Practice/Protocols:  VTE Prophylaxis: Lovenox (full dose) Continous Sedation  Consults: Treatment Team:  Melvenia Beam, MD    Studies:   Events:  Subjective:    Overnight Issues: some agitation  Objective:  Vital signs for last 24 hours: Temp:  [99.1 F (  37.3 C)-100.5 F (38.1 C)] 99.9 F (37.7 C) (09/23 0745) Pulse Rate:  [73-96] 96  (09/23 0845) Resp:  [17-31] 27  (09/23 0845) BP: (92-133)/(34-58) 108/48 mmHg (09/23 0845) SpO2:  [92 %-99 %] 93 % (09/23 0845) FiO2 (%):  [46.9 %-94.4 %] 47.6 % (09/23 0845) Weight:  [119.1 kg (262 lb 9.1 oz)] 119.1 kg (262 lb 9.1 oz) (09/23 0600)  Hemodynamic parameters for last 24 hours:    Intake/Output from previous day: 09/22 0701 - 09/23 0700 In: 2482.5 [I.V.:1472.5; NG/GT:910; IV Piggyback:100] Out: 2200 [Urine:2100; Stool:100]  Intake/Output this shift: Total I/O In: 0  Out: 175 [Urine:175]  Vent settings for last 24 hours: Vent Mode:  [-] CPAP FiO2 (%):  [46.9 %-94.4 %] 47.6 % Set Rate:  [22 bmp] 22 bmp Vt Set:  [650 mL] 650 mL PEEP:  [5 cmH20] 5 cmH20 Pressure Support:  [10 cmH20] 10 cmH20 Plateau Pressure:  [16 cmH20-18 cmH20] 17 cmH20  Physical Exam:  General: on vent HEENT/Neck: trach-clean, intact Resp: clear to auscultation bilaterally CVS: reg S1S2 GI: soft, NT, +BS Neuro: F/C with RLE, no mvt L side  Results for orders placed during the hospital encounter of 05/31/12 (from the past 24 hour(s))  GLUCOSE, CAPILLARY     Status: Abnormal   Collection Time   06/23/12 12:19 PM      Component Value Range   Glucose-Capillary 220 (*) 70 - 99 mg/dL   Comment 1 Notify RN    GLUCOSE, CAPILLARY     Status: Abnormal   Collection Time   06/23/12  4:42  PM      Component Value Range   Glucose-Capillary 211 (*) 70 - 99 mg/dL   Comment 1 Notify RN    GLUCOSE, CAPILLARY     Status: Abnormal   Collection Time   06/23/12  7:33 PM      Component Value Range   Glucose-Capillary 194 (*) 70 - 99 mg/dL   Comment 1 Notify RN    GLUCOSE, CAPILLARY     Status: Abnormal   Collection Time   06/24/12 12:09 AM      Component Value Range   Glucose-Capillary 195 (*) 70 - 99 mg/dL  PROTIME-INR     Status: Abnormal   Collection Time   06/24/12  5:53 AM      Component Value Range   Prothrombin Time 18.0 (*) 11.6 - 15.2 seconds   INR 1.54 (*) 0.00 - 1.49    Assessment & Plan: Present on Admission:  .Multiple rib fractures .TBI (traumatic brain injury) .Extensive facial fractures .Leukocytosis .Lactic acidosis .Pleural effusion   LOS: 24 days   Additional comments:I reviewed the patient's new clinical lab test results.  [facility]  VDRF -- continue weaning, hope to try trach collar soon, increase Klonopin and Seroquel TBI w/ICC -- per NS. Supportive care, Ot/PT as tolerated.  CVA -- per NS  Multiple facial fxs -- Non-operative per ENT  Multiple bilateral rib fxs w/HTPX s/p bilateral CT  ABL anemia -- need to check labs now  UE DVT -- Lovenox + coumadin.  ID -- afeb and WBC down, on Vanc/Maxipime/Diflucan with new Cx P 9/21 ARF -- check labs now  Cards -- Cardiology following for arrhythmias , on low dose Neo for pressure support FEN -- check labs now, may need PEG if not on HTC soon Dispo -- VDRF  DM -- increase lantus, SSI Critical Care Total Time*: 36 Minutes  Violeta Gelinas, MD, MPH, FACS Pager: [phone]  06/24/2012  *  Care during the described time interval was provided by me and/or other providers on the critical care team.  I have reviewed this patient's available data, including medical history, events of note, physical examination and test results as part of my evaluation.

## 2012-06-25 ENCOUNTER — Inpatient Hospital Stay (HOSPITAL_COMMUNITY): Payer: Medicaid Other

## 2012-06-25 LAB — CBC
HCT: 25.4 % — ABNORMAL LOW (ref 39.0–52.0)
Hemoglobin: 8 g/dL — ABNORMAL LOW (ref 13.0–17.0)
MCH: 28.8 pg (ref 26.0–34.0)
MCV: 91.4 fL (ref 78.0–100.0)
Platelets: 231 10*3/uL (ref 150–400)
RBC: 2.78 MIL/uL — ABNORMAL LOW (ref 4.22–5.81)
WBC: 9.4 10*3/uL (ref 4.0–10.5)

## 2012-06-25 LAB — TYPE AND SCREEN
ABO/RH(D): O POS
Unit division: 0

## 2012-06-25 LAB — BASIC METABOLIC PANEL
BUN: 60 mg/dL — ABNORMAL HIGH (ref 6–23)
BUN: 62 mg/dL — ABNORMAL HIGH (ref 6–23)
CO2: 22 mEq/L (ref 19–32)
Calcium: 7.9 mg/dL — ABNORMAL LOW (ref 8.4–10.5)
Chloride: 110 mEq/L (ref 96–112)
Chloride: 111 mEq/L (ref 96–112)
Creatinine, Ser: 1.42 mg/dL — ABNORMAL HIGH (ref 0.50–1.35)
GFR calc Af Amer: 58 mL/min — ABNORMAL LOW (ref >90)
GFR calc non Af Amer: 50 mL/min — ABNORMAL LOW (ref >90)
Glucose, Bld: 182 mg/dL — ABNORMAL HIGH (ref 70–99)
Potassium: 3.2 mEq/L — ABNORMAL LOW (ref 3.5–5.1)
Sodium: 141 mEq/L (ref 135–145)

## 2012-06-25 LAB — CULTURE, RESPIRATORY W GRAM STAIN: Gram Stain: NONE SEEN

## 2012-06-25 LAB — GLUCOSE, CAPILLARY
Glucose-Capillary: 190 mg/dL — ABNORMAL HIGH (ref 70–99)
Glucose-Capillary: 166 mg/dL — ABNORMAL HIGH (ref 70–99)
Glucose-Capillary: 184 mg/dL — ABNORMAL HIGH (ref 70–99)
Glucose-Capillary: 207 mg/dL — ABNORMAL HIGH (ref 70–99)

## 2012-06-25 LAB — MAGNESIUM: Magnesium: 2.1 mg/dL (ref 1.5–2.5)

## 2012-06-25 MED ORDER — POTASSIUM CHLORIDE 10 MEQ/50ML IV SOLN
INTRAVENOUS | Status: AC
  Filled 2012-06-25: qty 50

## 2012-06-25 MED ORDER — FUROSEMIDE 10 MG/ML IJ SOLN
20.0000 mg | Freq: Once | INTRAMUSCULAR | Status: AC
  Administered 2012-06-25: 20 mg via INTRAVENOUS

## 2012-06-25 MED ORDER — POTASSIUM CHLORIDE 10 MEQ/50ML IV SOLN
10.0000 meq | INTRAVENOUS | Status: AC
  Administered 2012-06-25 (×3): 10 meq via INTRAVENOUS
  Filled 2012-06-25: qty 100

## 2012-06-25 MED ORDER — SODIUM CHLORIDE 0.45 % IV SOLN: INTRAVENOUS | Status: DC

## 2012-06-25 MED ORDER — BACITRACIN ZINC 500 UNIT/GM EX OINT
TOPICAL_OINTMENT | Freq: Two times a day (BID) | CUTANEOUS | Status: DC
  Administered 2012-06-25: 1 via TOPICAL
  Administered 2012-06-26 – 2012-06-30 (×10): via TOPICAL
  Administered 2012-07-01: 1 via TOPICAL
  Administered 2012-07-01 – 2012-07-06 (×11): via TOPICAL
  Administered 2012-07-07: 1 via TOPICAL
  Administered 2012-07-07 – 2012-07-19 (×24): via TOPICAL
  Administered 2012-07-19 – 2012-07-20 (×2): 1 via TOPICAL
  Administered 2012-07-21 – 2012-07-24 (×7): via TOPICAL
  Administered 2012-07-24: 1 via TOPICAL
  Administered 2012-07-25 – 2012-07-31 (×13): via TOPICAL
  Administered 2012-07-31: 1 via TOPICAL
  Administered 2012-08-01 (×2): via TOPICAL
  Filled 2012-06-25 (×7): qty 15

## 2012-06-25 MED ORDER — WARFARIN SODIUM 3 MG PO TABS
3.0000 mg | ORAL_TABLET | Freq: Once | ORAL | Status: AC
  Administered 2012-06-25: 3 mg via ORAL
  Filled 2012-06-25: qty 1

## 2012-06-25 MED ORDER — POTASSIUM CHLORIDE 2 MEQ/ML IV SOLN
INTRAVENOUS | Status: DC
  Administered 2012-06-25 – 2012-07-05 (×9): via INTRAVENOUS
  Filled 2012-06-25 (×14): qty 1000

## 2012-06-25 NOTE — Progress Notes (Signed)
Pt placed back on full support at this time due to continuous periods of apnea

## 2012-06-25 NOTE — Progress Notes (Signed)
UR completed 

## 2012-06-25 NOTE — Progress Notes (Signed)
ANTICOAG CONSULT NOTE - FOLLOW UP  Pharmacy Consult for Coumadin Indication: RUE DVT  No Known Allergies  Patient Measurements: Height: 6\' 3"  (190.5 cm) Weight: 267 lb 10.2 oz (121.4 kg) IBW/kg (Calculated) : 84.5   Vital Signs: Temp: 99.5 F (37.5 C) (09/24 0800) Temp src: Oral (09/24 0800) BP: 119/44 mmHg (09/24 0900) Pulse Rate: 89  (09/24 0900) Intake/Output from previous day: 09/23 0701 - 09/24 0700 In: 3989.5 [I.V.:1625.7; Blood:713.8; NG/GT:1260; IV Piggyback:350] Out: 2100 [Urine:1950; Stool:150] Intake/Output from this shift: Total I/O In: 76 [I.V.:56; NG/GT:20] Out: -   Labs:  Basename 06/25/12 0513 06/24/12 2342 06/24/12 1805 06/24/12 1000 06/23/12 0617  WBC 9.4 -- -- 9.1 9.6  HGB 8.0* -- 7.0* 6.9* --  PLT 231 -- -- 244 256  LABCREA -- -- -- -- --  CREATININE 1.42* 1.44* -- 1.55* --   Estimated Creatinine Clearance: 73.8 ml/min (by C-G formula based on Cr of 1.42).  Assessment: 66 yo male continues on lovenox per MD and Coumadin per Rx for RUE DVT.  INR today (2.06) is within goal range (first day at goal) after 3 doses of 5 mg. Significant jump in INR, will give lower dose tonight anticipating that we have not seen full effects of previous doses. No bleeding noted per RN; s/p 2 units PRBC yesterday with hgb of 8 today, plt stable.   Goal of Therapy:  INR = 2-3  Plan:  1. Coumadin 3 mg PO x 1 tonight 2. Continue daily INR 3. Will need to watch CBC closely along with s/sx of bleeding and re-evaluate anticoagulation if needed   Nicolasa Ducking, PharmD Clinical Pharmacist Pgr [phone]  06/25/2012 10:51 AM

## 2012-06-25 NOTE — Progress Notes (Addendum)
Patient ID: Alex Foster, male   DOB: 08-17-1946, 66 y.o.   MRN: [id number] Follow up - Trauma Critical Care  Patient Details:    Alex Foster is an 66 y.o. male.  Lines/tubes : PICC Triple Lumen 06/04/12 PICC Right Basilic (Active)  Indication for Insertion or Continuance of Line Prolonged intravenous therapies 06/24/2012  8:00 PM  Length mark (cm) 0 cm 06/04/2012  8:35 AM  Site Assessment Clean;Dry;Intact 06/24/2012  8:00 PM  Lumen #1 Status Infusing;Flushed;Blood return noted 06/24/2012  8:00 PM  Lumen #2 Status Infusing;Flushed 06/24/2012  8:00 PM  Lumen #3 Status Infusing 06/24/2012  8:00 PM  Dressing Type Transparent;Occlusive 06/24/2012  8:00 PM  Dressing Status Clean;Dry;Intact;Antimicrobial disc in place 06/24/2012  8:00 PM  Line Care Connections checked and tightened 06/24/2012  8:00 PM  Dressing Intervention New dressing;Antimicrobial disc changed 06/20/2012  4:00 AM  Dressing Change Due 06/26/12 06/24/2012  7:00 AM     NG/OG Tube Nasogastric Left nare (Active)  Placement Verification Auscultation 06/25/2012  4:00 AM  Site Assessment Clean;Dry;Intact 06/24/2012  8:00 PM  Status Infusing tube feed 06/25/2012  4:00 AM  Drainage Appearance Tan 06/23/2012 12:00 PM  Gastric Residual 5 mL 06/25/2012  4:00 AM  Intake (mL) 20 mL 06/25/2012  7:00 AM     Rectal Tube/Pouch (Active)  Output (mL) 125 mL 06/24/2012  6:00 PM  Intake (mL) 40 mL 06/24/2012  8:00 AM     Urethral Catheter Straight-tip 14 Fr. (Active)  Site Assessment Clean;Intact;Swelling 06/24/2012  8:00 PM  Collection Container Standard drainage bag 06/24/2012  8:00 PM  Securement Method Leg strap 06/24/2012  8:00 PM  Urinary Catheter Interventions Unclamped 06/24/2012  8:00 PM     External Urinary Catheter (Active)  Collection Container Standard drainage bag 06/23/2012  8:00 AM  Securement Method Leg strap 06/23/2012  8:00 AM  Output (mL) 135 mL 06/22/2012  6:00 PM    Microbiology/Sepsis markers: Results for orders placed during the  hospital encounter of 05/31/12  MRSA PCR SCREENING     Status: Abnormal   Collection Time   05/31/12  8:58 PM      Component Value Range Status Comment   MRSA by PCR INVALID RESULTS, SPECIMEN SENT FOR CULTURE (*) NEGATIVE Final   MRSA CULTURE     Status: Normal   Collection Time   05/31/12  8:58 PM      Component Value Range Status Comment   Specimen Description NOSE   Final    Special Requests NONE   Final    Culture     Final    Value: NO STAPHYLOCOCCUS AUREUS ISOLATED     Note: NO MRSA ISOLATED   Report Status 06/03/2012 FINAL   Final   CULTURE, RESPIRATORY     Status: Normal   Collection Time   06/02/12  4:07 PM      Component Value Range Status Comment   Specimen Description BRONCHIAL ALVEOLAR LAVAGE   Final    Special Requests NONE   Final    Gram Stain     Final    Value: ABUNDANT WBC PRESENT, PREDOMINANTLY PMN     NO SQUAMOUS EPITHELIAL CELLS SEEN     MODERATE GRAM POSITIVE COCCI     IN PAIRS RARE GRAM NEGATIVE RODS   Culture     Final    Value: MODERATE HAEMOPHILUS PARAINFLUENZAE     Note: BETA LACTAMASE NEGATIVE   Report Status 06/05/2012 FINAL   Final   CLOSTRIDIUM DIFFICILE BY PCR  Status: Normal   Collection Time   06/13/12  8:05 AM      Component Value Range Status Comment   C difficile by pcr NEGATIVE  NEGATIVE Final   URINE CULTURE     Status: Normal   Collection Time   06/16/12  9:00 PM      Component Value Range Status Comment   Specimen Description URINE, CLEAN CATCH   Final    Special Requests NONE   Final    Culture  Setup Time 06/16/2012 21:37   Final    Colony Count NO GROWTH   Final    Culture NO GROWTH   Final    Report Status 06/18/2012 FINAL   Final   CULTURE, BLOOD (ROUTINE X 2)     Status: Normal   Collection Time   06/16/12  9:02 PM      Component Value Range Status Comment   Specimen Description BLOOD RIGHT FOOT   Final    Special Requests BOTTLES DRAWN AEROBIC ONLY 10CC   Final    Culture  Setup Time 06/17/2012 14:17   Final    Culture  NO GROWTH 5 DAYS   Final    Report Status 06/23/2012 FINAL   Final   CULTURE, BLOOD (ROUTINE X 2)     Status: Normal   Collection Time   06/16/12  9:07 PM      Component Value Range Status Comment   Specimen Description BLOOD LEFT FOOT   Final    Special Requests BOTTLES DRAWN AEROBIC AND ANAEROBIC 10CC EACH   Final    Culture  Setup Time 06/17/2012 14:17   Final    Culture NO GROWTH 5 DAYS   Final    Report Status 06/23/2012 FINAL   Final   CULTURE, RESPIRATORY     Status: Normal   Collection Time   06/18/12 11:41 PM      Component Value Range Status Comment   Specimen Description TRACHEAL ASPIRATE   Final    Special Requests NONE   Final    Gram Stain     Final    Value: ABUNDANT WBC PRESENT, PREDOMINANTLY PMN     RARE SQUAMOUS EPITHELIAL CELLS PRESENT     FEW GRAM POSITIVE COCCI IN CLUSTERS     RARE GRAM POSITIVE RODS     RARE GRAM POSITIVE COCCI IN CHAINS   Culture Non-Pathogenic Oropharyngeal-type Flora Isolated.   Final    Report Status 06/21/2012 FINAL   Final   CULTURE, BLOOD (ROUTINE X 2)     Status: Normal (Preliminary result)   Collection Time   06/22/12  3:28 AM      Component Value Range Status Comment   Specimen Description BLOOD RIGHT ARM   Final    Special Requests BOTTLES DRAWN AEROBIC AND ANAEROBIC 10CC EACH   Final    Culture  Setup Time 06/22/2012 11:37   Final    Culture     Final    Value:        BLOOD CULTURE RECEIVED NO GROWTH TO DATE CULTURE WILL BE HELD FOR 5 DAYS BEFORE ISSUING A FINAL NEGATIVE REPORT   Report Status PENDING   Incomplete   CULTURE, BLOOD (ROUTINE X 2)     Status: Normal (Preliminary result)   Collection Time   06/22/12  3:35 AM      Component Value Range Status Comment   Specimen Description BLOOD RIGHT HAND   Final    Special Requests BOTTLES DRAWN AEROBIC AND  ANAEROBIC 10CC EACH   Final    Culture  Setup Time 06/22/2012 11:37   Final    Culture     Final    Value:        BLOOD CULTURE RECEIVED NO GROWTH TO DATE CULTURE WILL BE HELD  FOR 5 DAYS BEFORE ISSUING A FINAL NEGATIVE REPORT   Report Status PENDING   Incomplete   CULTURE, RESPIRATORY     Status: Normal (Preliminary result)   Collection Time   06/22/12  7:49 AM      Component Value Range Status Comment   Specimen Description TRACHEAL ASPIRATE   Final    Special Requests NONE   Final    Gram Stain PENDING   Incomplete    Culture MODERATE STENOTROPHOMONAS MALTOPHILIA   Final    Report Status PENDING   Incomplete    Organism ID, Bacteria STENOTROPHOMONAS MALTOPHILIA   Final   URINE CULTURE     Status: Normal   Collection Time   06/22/12  5:12 PM      Component Value Range Status Comment   Specimen Description URINE, CATHETERIZED   Final    Special Requests NONE   Final    Culture  Setup Time 06/23/2012 11:39   Final    Colony Count NO GROWTH   Final    Culture NO GROWTH   Final    Report Status 06/24/2012 FINAL   Final     Anti-infectives:  Anti-infectives     Start     Dose/Rate Route Frequency Ordered Stop   06/24/12 1800   levofloxacin (LEVAQUIN) IVPB 750 mg        750 mg 100 mL/hr over 90 Minutes Intravenous Every 24 hours 06/24/12 1539 07/04/12 1759   06/23/12 1000   fluconazole (DIFLUCAN) IVPB 100 mg        100 mg 50 mL/hr over 60 Minutes Intravenous Every 24 hours 06/22/12 1632     06/23/12 0000   vancomycin (VANCOCIN) 1,500 mg in sodium chloride 0.9 % 500 mL IVPB  Status:  Discontinued        1,500 mg 250 mL/hr over 120 Minutes Intravenous Every 24 hours 06/22/12 1145 06/24/12 1539   06/22/12 1730   fluconazole (DIFLUCAN) IVPB 200 mg        200 mg 100 mL/hr over 60 Minutes Intravenous  Once 06/22/12 1630 06/22/12 1852   06/19/12 2200   vancomycin (VANCOCIN) IVPB 1000 mg/200 mL premix  Status:  Discontinued        1,000 mg 200 mL/hr over 60 Minutes Intravenous Every 12 hours 06/19/12 0940 06/22/12 1123   06/19/12 1000   vancomycin (VANCOCIN) 2,000 mg in sodium chloride 0.9 % 500 mL IVPB        2,000 mg 250 mL/hr over 120 Minutes  Intravenous  Once 06/19/12 0940 06/19/12 1300   06/19/12 0930   ceFEPIme (MAXIPIME) 2 g in dextrose 5 % 50 mL IVPB  Status:  Discontinued        2 g 100 mL/hr over 30 Minutes Intravenous 3 times per day 06/19/12 0904 06/24/12 1539   06/04/12 1000   vancomycin (VANCOCIN) IVPB 1000 mg/200 mL premix  Status:  Discontinued        1,000 mg 200 mL/hr over 60 Minutes Intravenous Every 12 hours 06/04/12 0837 06/05/12 1045   06/03/12 1800   vancomycin (VANCOCIN) 1,500 mg in sodium chloride 0.9 % 500 mL IVPB  Status:  Discontinued        1,500  mg 250 mL/hr over 120 Minutes Intravenous Every 24 hours 06/02/12 1428 06/04/12 0837   06/02/12 1800   vancomycin (VANCOCIN) 2,000 mg in sodium chloride 0.9 % 500 mL IVPB        2,000 mg 250 mL/hr over 120 Minutes Intravenous  Once 06/02/12 1428 06/02/12 2024   06/02/12 1400   cefTAZidime (FORTAZ) 1 g in dextrose 5 % 50 mL IVPB        1 g 100 mL/hr over 30 Minutes Intravenous 3 times per day 06/02/12 1349 06/12/12 6578          Best Practice/Protocols:  VTE Prophylaxis: Lovenox (full dose) Continous Sedation  Consults: Treatment Team:  Melvenia Beam, MD    Studies:    Dg Chest Port 1 View  06/25/2012  *RADIOLOGY REPORT*  Clinical Data: Respiratory failure  PORTABLE CHEST - 1 VIEW  Comparison: 06/22/2012; 06/21/2012; 06/20/2012  Findings: Examination is degraded secondary to exclusion of the bilateral costophrenic angles.  Grossly unchanged enlarged cardiac silhouette and mediastinal contours. Stable position of support apparatus.  No definite pneumothorax.  Pulmonary vasculature remains indistinct.  No change to minimal worsening in bilateral pleural effusions and bibasilar/perihilar heterogeneous opacities.  Grossly unchanged bones icluding multiple right-sided displaced rib fractures. Shrapnel overlies the chest.  IMPRESSION:  1.  Stable positioning of support apparatus.  No pneumothorax. 2.  Degraded examination with findings suggestive of  unchanged to minimally worsened pulmonary edema, small bilateral effusions and basilar opacities, atelectasis versus infiltrate.   Original Report Authenticated By: Waynard Reeds, M.D.   Events:  Subjective:    Overnight Issues:   Objective:  Vital signs for last 24 hours: Temp:  [98.7 F (37.1 C)-100.4 F (38 C)] 99.6 F (37.6 C) (09/24 0400) Pulse Rate:  [53-101] 93  (09/24 0800) Resp:  [19-30] 30  (09/24 0800) BP: (80-133)/(28-65) 110/49 mmHg (09/24 0800) SpO2:  [87 %-98 %] 89 % (09/24 0800) FiO2 (%):  [46.7 %-50 %] 50 % (09/24 0800) Weight:  [121.4 kg (267 lb 10.2 oz)] 121.4 kg (267 lb 10.2 oz) (09/24 0400)  Hemodynamic parameters for last 24 hours:    Intake/Output from previous day: 09/23 0701 - 09/24 0700 In: 3989.5 [I.V.:1625.7; Blood:713.8; NG/GT:1260; IV Piggyback:350] Out: 2100 [Urine:1950; Stool:150]  Intake/Output this shift:    Vent settings for last 24 hours: Vent Mode:  [-] CPAP FiO2 (%):  [46.7 %-50 %] 50 % Set Rate:  [22 bmp] 22 bmp Vt Set:  [0 mL-650 mL] 0 mL PEEP:  [4.9 cmH20-5.1 cmH20] 5 cmH20 Pressure Support:  [10 cmH20] 10 cmH20 Plateau Pressure:  [18 cmH20] 18 cmH20  Physical Exam:  General: on vent HEENT/Neck: trach-clean, intact Resp: few rhonchi CVS: reg GI: soft, ND, NT, +BS Neuro: arouses on vent, F/C with RLE, no mvt L side  Results for orders placed during the hospital encounter of 05/31/12 (from the past 24 hour(s))  CBC     Status: Abnormal   Collection Time   06/24/12 10:00 AM      Component Value Range   WBC 9.1  4.0 - 10.5 K/uL   RBC 2.41 (*) 4.22 - 5.81 MIL/uL   Hemoglobin 6.9 (*) 13.0 - 17.0 g/dL   HCT 46.9 (*) 62.9 - 52.8 %   MCV 92.5  78.0 - 100.0 fL   MCH 28.6  26.0 - 34.0 pg   MCHC 30.9  30.0 - 36.0 g/dL   RDW 41.3  24.4 - 01.0 %   Platelets 244  150 - 400  K/uL  BASIC METABOLIC PANEL     Status: Abnormal   Collection Time   06/24/12 10:00 AM      Component Value Range   Sodium 145  135 - 145 mEq/L   Potassium  3.2 (*) 3.5 - 5.1 mEq/L   Chloride 114 (*) 96 - 112 mEq/L   CO2 23  19 - 32 mEq/L   Glucose, Bld 212 (*) 70 - 99 mg/dL   BUN 65 (*) 6 - 23 mg/dL   Creatinine, Ser 1.61 (*) 0.50 - 1.35 mg/dL   Calcium 7.8 (*) 8.4 - 10.5 mg/dL   GFR calc non Af Amer 46 (*) >90 mL/min   GFR calc Af Amer 53 (*) >90 mL/min  PREPARE RBC (CROSSMATCH)     Status: Normal   Collection Time   06/24/12 11:27 AM      Component Value Range   Order Confirmation ORDER PROCESSED BY BLOOD BANK    TYPE AND SCREEN     Status: Normal (Preliminary result)   Collection Time   06/24/12 11:27 AM      Component Value Range   ABO/RH(D) O POS     Antibody Screen NEG     Sample Expiration 06/27/2012     Unit Number [id number]     Blood Component Type RED CELLS,LR     Unit division 00     Status of Unit ISSUED     Transfusion Status OK TO TRANSFUSE     Crossmatch Result Compatible     Unit Number [id number]     Blood Component Type RED CELLS,LR     Unit division 00     Status of Unit ISSUED     Transfusion Status OK TO TRANSFUSE     Crossmatch Result Compatible    GLUCOSE, CAPILLARY     Status: Abnormal   Collection Time   06/24/12 12:28 PM      Component Value Range   Glucose-Capillary 220 (*) 70 - 99 mg/dL  GLUCOSE, CAPILLARY     Status: Abnormal   Collection Time   06/24/12  4:13 PM      Component Value Range   Glucose-Capillary 175 (*) 70 - 99 mg/dL   Comment 1 Notify RN     Comment 2 Documented in Chart    HEMOGLOBIN AND HEMATOCRIT, BLOOD     Status: Abnormal   Collection Time   06/24/12  6:05 PM      Component Value Range   Hemoglobin 7.0 (*) 13.0 - 17.0 g/dL   HCT 65.7 (*) 84.6 - 96.2 %  PREPARE RBC (CROSSMATCH)     Status: Normal   Collection Time   06/24/12  7:30 PM      Component Value Range   Order Confirmation ORDER PROCESSED BY BLOOD BANK    GLUCOSE, CAPILLARY     Status: Abnormal   Collection Time   06/24/12  8:20 PM      Component Value Range   Glucose-Capillary 179 (*) 70 - 99 mg/dL    Comment 1 Documented in Chart     Comment 2 Notify RN    BASIC METABOLIC PANEL     Status: Abnormal   Collection Time   06/24/12 11:42 PM      Component Value Range   Sodium 141  135 - 145 mEq/L   Potassium 3.2 (*) 3.5 - 5.1 mEq/L   Chloride 110  96 - 112 mEq/L   CO2 22  19 - 32 mEq/L   Glucose,  Bld 197 (*) 70 - 99 mg/dL   BUN 62 (*) 6 - 23 mg/dL   Creatinine, Ser 1.61 (*) 0.50 - 1.35 mg/dL   Calcium 7.7 (*) 8.4 - 10.5 mg/dL   GFR calc non Af Amer 50 (*) >90 mL/min   GFR calc Af Amer 58 (*) >90 mL/min  MAGNESIUM     Status: Normal   Collection Time   06/24/12 11:42 PM      Component Value Range   Magnesium 2.1  1.5 - 2.5 mg/dL  GLUCOSE, CAPILLARY     Status: Abnormal   Collection Time   06/24/12 11:52 PM      Component Value Range   Glucose-Capillary 190 (*) 70 - 99 mg/dL  GLUCOSE, CAPILLARY     Status: Abnormal   Collection Time   06/25/12  4:18 AM      Component Value Range   Glucose-Capillary 166 (*) 70 - 99 mg/dL  PROTIME-INR     Status: Abnormal   Collection Time   06/25/12  5:13 AM      Component Value Range   Prothrombin Time 22.4 (*) 11.6 - 15.2 seconds   INR 2.06 (*) 0.00 - 1.49  CBC     Status: Abnormal   Collection Time   06/25/12  5:13 AM      Component Value Range   WBC 9.4  4.0 - 10.5 K/uL   RBC 2.78 (*) 4.22 - 5.81 MIL/uL   Hemoglobin 8.0 (*) 13.0 - 17.0 g/dL   HCT 09.6 (*) 04.5 - 40.9 %   MCV 91.4  78.0 - 100.0 fL   MCH 28.8  26.0 - 34.0 pg   MCHC 31.5  30.0 - 36.0 g/dL   RDW 81.1 (*) 91.4 - 78.2 %   Platelets 231  150 - 400 K/uL  BASIC METABOLIC PANEL     Status: Abnormal   Collection Time   06/25/12  5:13 AM      Component Value Range   Sodium 144  135 - 145 mEq/L   Potassium 3.3 (*) 3.5 - 5.1 mEq/L   Chloride 111  96 - 112 mEq/L   CO2 22  19 - 32 mEq/L   Glucose, Bld 182 (*) 70 - 99 mg/dL   BUN 60 (*) 6 - 23 mg/dL   Creatinine, Ser 9.56 (*) 0.50 - 1.35 mg/dL   Calcium 7.9 (*) 8.4 - 10.5 mg/dL   GFR calc non Af Amer 51 (*) >90 mL/min   GFR  calc Af Amer 59 (*) >90 mL/min    Assessment & Plan: Present on Admission:  .Multiple rib fractures .TBI (traumatic brain injury) .Extensive facial fractures .Leukocytosis .Lactic acidosis .Pleural effusion   LOS: 25 days   Additional comments:I reviewed the patient's new clinical lab test results. and cxr [facility]  VDRF -- continue weaning, hope to try trach collar soon, increased Klonopin and Seroquel and seems more calm TBI w/ICC -- per NS. Supportive care, Ot/PT as tolerated.  CVA -- per NS  Multiple facial fxs -- Non-operative per ENT  Multiple bilateral rib fxs w/HTPX s/p bilateral CT  ABL anemia -- improved S/P 2u PRBC, also suspect some hemodilution  UE DVT -- Lovenox + coumadin.  ID -- Levaquin for Stenotrophomonas PNA ARF -- crt stable level, lasix today one dose  Cards -- Cardiology following for arrhythmias , on low dose Neo for pressure support, brief run of a fib that resolved FEN --  may need PEG if not on HTC soon,  on free water and TF, replace K DM -- increased lantus with some improvement in CBG, SSI Critical Care Total Time*: 37 Minutes  Violeta Gelinas, MD, MPH, FACS Pager: [phone]  06/25/2012  *Care during the described time interval was provided by me and/or other providers on the critical care team.  I have reviewed this patient's available data, including medical history, events of note, physical examination and test results as part of my evaluation.

## 2012-06-25 NOTE — Clinical Social Work Note (Signed)
Clinical Social Worker continuing to follow patient for support and to facilitate patient discharge needs.  CSW spoke with patient brother Noelan Mesch) over the phone to discuss different options for patient once medically appropriate for discharge.  CSW spoke about inpatient rehab vs. SNF option.  Patient brother is open to both dispositions.  CSW encouraged brother to start working on arrangements for potential 24 support in the event that patient progresses and qualifies for inpatient rehab.  Patient brother seemed hesitant at first but with continued explanation about options he became much more comfortable with the idea.  Patient remains on the ventilator at this time with new onset Afib and a Neo drip, therefore continues to require ICU level of care.  Patient is gradually weaning from the ventilator.    Clinical Social Worker will continue to remain available for support and to facilitate patient discharge needs once medically stable.  Per PA/MD they feel strongly that patient will wean with time and to pursue a vent SNF at this time is not necessary.  Macario Golds, [place] [phone]

## 2012-06-25 NOTE — Progress Notes (Signed)
Occupational Therapy Treatment Patient Details Name: Cynthia Challender MRN: [id number] DOB: March 08, 1946 Today's Date: 06/25/2012 Time: 4098-1191 OT Time Calculation (min): 25 min  OT Assessment / Plan / Recommendation Comments on Treatment Session Pt demonstrating some purposeful movement R side. Responds to painful stimuli L side. BUE edematous. appears to attend > 5 seconds today. Did not track visually. L eye fixed.     Follow Up Recommendations  Inpatient Rehab    Barriers to Discharge       Equipment Recommendations  Defer to next venue    Recommendations for Other Services Rehab consult  Frequency Min 2X/week   Plan Discharge plan remains appropriate    Precautions / Restrictions Precautions Precautions: Cervical Precaution Comments: LT UE risk for subluxation due to no AROM noted Required Braces or Orthoses: Cervical Brace Cervical Brace: Hard collar;Applied in supine position   Pertinent Vitals/Pain Vitals stable throughout treatment. Withdraws from painful stimuli    ADL  Grooming: Performed;+1 Total assistance (wash face hand over hand) Where Assessed - Grooming: Supine, head of bed up (HOB 45) ADL Comments: MAP 64. HOB 45. Able to arouse pt but pt unable to [facility] state of arousal. Following 1 step comands inconsistently. Shakes head yes to "is your name buddy", wiggle R toes on command.  (No movement noted L side. L boot adjusted to imcrease suppor)    OT Diagnosis:    OT Problem List:   OT Treatment Interventions:     OT Goals Acute Rehab OT Goals OT Goal Formulation: Patient unable to participate in goal setting Time For Goal Achievement: 07/03/12 Potential to Achieve Goals: Good ADL Goals Pt Will Perform Grooming: with mod assist;Supine, head of bed up;Sitting, edge of bed;Supported;Other (comment) ADL Goal: Grooming - Progress: Progressing toward goals Miscellaneous OT Goals Miscellaneous OT Goal #1: Pt will follow 2 step command 2 out 3 attempts during  session  OT Goal: Miscellaneous Goal #1 - Progress: Progressing toward goals Miscellaneous OT Goal #2: Pt will initiate 2 adl task with presentation of adl item and 1 simple verbal cue OT Goal: Miscellaneous Goal #2 - Progress: Progressing toward goals Miscellaneous OT Goal #3: Pt will tolerate eob sitting for 5 minutes as precursor to adls OT Goal: Miscellaneous Goal #3 - Progress: Progressing toward goals  Visit Information  Last OT Received On: 06/25/12 Assistance Needed: +2    Subjective Data      Prior Functioning       Cognition  Overall Cognitive Status: Impaired Area of Impairment: Attention;Following commands Arousal/Alertness: Lethargic Orientation Level: Other (comment) Behavior During Session: Lethargic Current Attention Level: Focused Attention - Other Comments: Able to arouse even with sedation and attend > 5, < 10 seconds at a time. Following Commands: Follows one step commands inconsistently;Follows one step commands with increased time    Mobility  Shoulder Instructions         Exercises  Low Level/ICU Exercises Ankle Circles/Pumps:  (ROM as tolerated BUE. )   Balance     End of Session OT - End of Session Activity Tolerance: Treatment limited secondary to medication Patient left: in bed;with call bell/phone within reach Nurse Communication: Precautions  GO     Rosalita Carey,HILLARY 06/25/2012, 10:14 AM Luisa Dago, OTR/L  [phone] 06/25/2012

## 2012-06-25 NOTE — Progress Notes (Signed)
Speech Language Pathology Treatment Patient Details Name: Alex Foster MRN: [id number] DOB: 05/15/1946 Today's Date: 06/25/2012 Time:  -    Attempted to see for cognitive facilitation, however RN reported she turned up his sedation as he "was getting agiiated."  Pt. currently fluctuates between a [place] level IV (confused; agitated) and V (confused inappropriate) therefore may exhibit aggressive behaviors intermittently.  Consistent and increased sedatives may slow and prolong the natural progression of TBI recovery, however, pt. may have needed them for his and staff safety at that time?  Breck Coons Salome.Ed [employer] [phone]  06/25/2012

## 2012-06-26 ENCOUNTER — Inpatient Hospital Stay (HOSPITAL_COMMUNITY): Payer: Medicaid Other

## 2012-06-26 LAB — GLUCOSE, CAPILLARY
Glucose-Capillary: 141 mg/dL — ABNORMAL HIGH (ref 70–99)
Glucose-Capillary: 196 mg/dL — ABNORMAL HIGH (ref 70–99)
Glucose-Capillary: 233 mg/dL — ABNORMAL HIGH (ref 70–99)

## 2012-06-26 LAB — BASIC METABOLIC PANEL
BUN: 56 mg/dL — ABNORMAL HIGH (ref 6–23)
Calcium: 7.9 mg/dL — ABNORMAL LOW (ref 8.4–10.5)
Chloride: 107 mEq/L (ref 96–112)
Creatinine, Ser: 1.36 mg/dL — ABNORMAL HIGH (ref 0.50–1.35)
GFR calc Af Amer: 62 mL/min — ABNORMAL LOW (ref >90)
GFR calc non Af Amer: 53 mL/min — ABNORMAL LOW (ref >90)

## 2012-06-26 LAB — PROTIME-INR: INR: 2.32 — ABNORMAL HIGH (ref 0.00–1.49)

## 2012-06-26 LAB — CBC
MCHC: 31.7 g/dL (ref 30.0–36.0)
MCV: 90.4 fL (ref 78.0–100.0)
Platelets: 209 10*3/uL (ref 150–400)
RDW: 16 % — ABNORMAL HIGH (ref 11.5–15.5)
WBC: 8.1 10*3/uL (ref 4.0–10.5)

## 2012-06-26 MED ORDER — QUETIAPINE FUMARATE 100 MG PO TABS
100.0000 mg | ORAL_TABLET | Freq: Three times a day (TID) | ORAL | Status: DC
  Administered 2012-06-26 – 2012-07-10 (×42): 100 mg
  Filled 2012-06-26 (×45): qty 1

## 2012-06-26 MED ORDER — PIVOT 1.5 CAL PO LIQD
1000.0000 mL | ORAL | Status: DC
  Filled 2012-06-26: qty 1000

## 2012-06-26 MED ORDER — PIVOT 1.5 CAL PO LIQD
1000.0000 mL | ORAL | Status: DC
  Administered 2012-06-27 – 2012-06-30 (×2): 1000 mL
  Filled 2012-06-26 (×5): qty 1000

## 2012-06-26 MED ORDER — WARFARIN SODIUM 3 MG PO TABS
3.0000 mg | ORAL_TABLET | Freq: Once | ORAL | Status: AC
  Administered 2012-06-26: 3 mg via ORAL
  Filled 2012-06-26 (×3): qty 1

## 2012-06-26 MED ORDER — CLONAZEPAM 0.5 MG PO TABS
2.0000 mg | ORAL_TABLET | Freq: Two times a day (BID) | ORAL | Status: DC
  Administered 2012-06-26 – 2012-07-02 (×12): 2 mg
  Filled 2012-06-26: qty 1
  Filled 2012-06-26 (×2): qty 2
  Filled 2012-06-26: qty 1
  Filled 2012-06-26: qty 4
  Filled 2012-06-26 (×8): qty 2
  Filled 2012-06-26: qty 1

## 2012-06-26 NOTE — Progress Notes (Signed)
Nutrition Follow-up  Intervention:    Continue current TF regimen (Pivot 1.5 at goal rate of 20 mL/hr with ProStat (60 mL) 5 times daily).  Continue MVI daily.  Assessment:   Patient is currently intubated on ventilator support- unable to wean.  MV: 12.7 Temp:Temp (24hrs), Avg:99 F (37.2 C), Min:97.8 F (36.6 C), Max:100.1 F (37.8 C) Noted possible PEG tube placement in future.  Current TF regimen: Pivot 1.5 at 20 mL/hr (goal rate) with ProStat (60 mL) 5 times daily via OGT, which provides 1720 kcal (64% estimated needs), 195 gm protein (>100% estimated needs), and 364 mL free water daily. Water flushes (400 mL) QID, for total 1964 mL free water daily. MVI daily.  Diet Order:  NPO  Meds: Scheduled Meds:    . antiseptic oral rinse  15 mL Mouth Rinse Q2H  . bacitracin   Topical BID  . chlorhexidine  15 mL Mouth/Throat BID  . clonazePAM  2 mg Per Tube BID  . enoxaparin (LOVENOX) injection  1 mg/kg Subcutaneous Q12H  . feeding supplement (PIVOT 1.5 CAL)  1,000 mL Per Tube Q24H  . feeding supplement  60 mL Per Tube 5 X Daily  . fentaNYL  50 mcg Transdermal Q72H  . fluconazole (DIFLUCAN) IV  100 mg Intravenous Q24H  . free water  400 mL Per Tube Q6H  . insulin aspart  0-15 Units Subcutaneous Q4H  . insulin glargine  45 Units Subcutaneous Daily  . levofloxacin (LEVAQUIN) IV  750 mg Intravenous Q24H  . multivitamin  5 mL Per Tube Daily  . pantoprazole sodium  40 mg Per Tube Q1200  . polyethylene glycol  17 g Per Tube Daily  . potassium chloride  10 mEq Intravenous Q1 Hr x 3  . QUEtiapine  100 mg Per Tube Q8H  . sodium chloride  10-40 mL Intracatheter Q12H  . warfarin  3 mg Oral ONCE-1800  . warfarin  3 mg Oral ONCE-1800  . Warfarin - Pharmacist Dosing Inpatient   Does not apply q1800  . DISCONTD: clonazePAM  2 mg Per Tube Q8H  . DISCONTD: feeding supplement (PIVOT 1.5 CAL)  1,000 mL Per Tube Q24H  . DISCONTD: QUEtiapine  200 mg Per Tube Q8H  . DISCONTD: sodium chloride  500  mL Intravenous Once   Continuous Infusions:    . dextrose 5 % with kcl 20 mL/hr at 06/26/12 0700  . fentaNYL infusion INTRAVENOUS 50 mcg/hr (06/26/12 1200)  . midazolam (VERSED) infusion Stopped (06/26/12 0750)  . phenylephrine (NEO-SYNEPHRINE) Adult infusion 30 mcg/min (06/26/12 1200)   PRN Meds:.acetaminophen (TYLENOL) oral liquid 160 mg/5 mL, albuterol, bisacodyl, fentaNYL, ipratropium, midazolam, ondansetron (ZOFRAN) IV, sodium chloride  Labs:  CMP     Component Value Date/Time   NA 139 06/26/2012 0355   K 3.2* 06/26/2012 0355   CL 107 06/26/2012 0355   CO2 24 06/26/2012 0355   GLUCOSE 163* 06/26/2012 0355   BUN 56* 06/26/2012 0355   CREATININE 1.36* 06/26/2012 0355   CALCIUM 7.9* 06/26/2012 0355   PROT 4.9* 06/04/2012 0420   ALBUMIN 2.3* 06/04/2012 0420   AST 22 06/04/2012 0420   ALT 15 06/04/2012 0420   ALKPHOS 90 06/04/2012 0420   BILITOT 0.6 06/04/2012 0420   GFRNONAA 53* 06/26/2012 0355   GFRAA 62* 06/26/2012 0355   CBG (last 3)   Basename 06/26/12 1154 06/26/12 0809 06/26/12 0343  GLUCAP 233* 146* 155*     Intake/Output Summary (Last 24 hours) at 06/26/12 1218 Last data filed at 06/26/12 1200  Gross per 24 hour  Intake 1718.37 ml  Output   3315 ml  Net -1596.63 ml   Gastric residuals: 10 mL (06/26/12), 5 mL (06/25/12), 40 mL (06/24/12), 30 mL (06/23/12)      Weight Status:  119.3 kg Wt Readings from Last 10 Encounters:  06/26/12 263 lb 0.1 oz (119.3 kg)  06/26/12 263 lb 0.1 oz (119.3 kg)  06/11/12     112.7 kg 06/10/12       114.7 kg  Estimated needs:  2702 kcal, >/= 178 gm protein  Nutrition Dx:  Inadequate oral intake, ongoing.  Goal:  Enteral nutrition to provide 60-70% of estimated calorie needs (22-25 kcals/kg ideal body weight) and >/= 90% of estimated protein needs, based on ASPEN guidelines for permissive underfeeding in critically ill obese individuals; met.  Monitor:  TF tolerance, weight, vent status, possible PEG   Tauheedah Bok, Trinda Pascal RD, LDN

## 2012-06-26 NOTE — Progress Notes (Signed)
Follow up - Trauma and Critical Care  Patient Details:    Alex Foster is an 66 y.o. male.  Lines/tubes : PICC Triple Lumen 06/04/12 PICC Right Basilic (Active)  Indication for Insertion or Continuance of Line Prolonged intravenous therapies 06/25/2012  8:00 PM  Length mark (cm) 0 cm 06/04/2012  8:35 AM  Site Assessment Clean;Dry;Intact 06/25/2012  8:00 PM  Lumen #1 Status Infusing 06/25/2012  8:00 PM  Lumen #2 Status Infusing 06/25/2012  8:00 PM  Lumen #3 Status Infusing 06/25/2012  8:00 PM  Dressing Type Transparent;Occlusive 06/25/2012  8:00 PM  Dressing Status Clean;Dry;Intact;Antimicrobial disc in place 06/25/2012  8:00 PM  Line Care Connections checked and tightened 06/25/2012  8:00 PM  Dressing Intervention New dressing;Antimicrobial disc changed 06/20/2012  4:00 AM  Dressing Change Due 06/26/12 06/25/2012  8:00 PM     NG/OG Tube Nasogastric Left nare (Active)  Placement Verification Auscultation 06/26/2012  6:00 AM  Site Assessment Clean;Dry;Intact 06/26/2012  6:00 AM  Status Infusing tube feed 06/26/2012  6:00 AM  Drainage Appearance Tan 06/23/2012 12:00 PM  Gastric Residual 10 mL 06/26/2012  4:00 AM  Intake (mL) 20 mL 06/26/2012  8:00 AM     Rectal Tube/Pouch (Active)  Output (mL) 100 mL 06/25/2012 10:00 PM  Intake (mL) 40 mL 06/25/2012  8:00 AM     Urethral Catheter Straight-tip 14 Fr. (Active)  Indication for Insertion or Continuance of Catheter Urinary output monitoring 06/25/2012  8:00 PM  Site Assessment Clean;Intact 06/25/2012  8:00 PM  Collection Container Standard drainage bag 06/25/2012  8:00 PM  Securement Method Leg strap 06/25/2012  8:00 PM  Urinary Catheter Interventions Unclamped 06/25/2012  8:00 PM    Microbiology/Sepsis markers: Results for orders placed during the hospital encounter of 05/31/12  MRSA PCR SCREENING     Status: Abnormal   Collection Time   05/31/12  8:58 PM      Component Value Range Status Comment   MRSA by PCR INVALID RESULTS, SPECIMEN SENT FOR  CULTURE (*) NEGATIVE Final   MRSA CULTURE     Status: Normal   Collection Time   05/31/12  8:58 PM      Component Value Range Status Comment   Specimen Description NOSE   Final    Special Requests NONE   Final    Culture     Final    Value: NO STAPHYLOCOCCUS AUREUS ISOLATED     Note: NO MRSA ISOLATED   Report Status 06/03/2012 FINAL   Final   CULTURE, RESPIRATORY     Status: Normal   Collection Time   06/02/12  4:07 PM      Component Value Range Status Comment   Specimen Description BRONCHIAL ALVEOLAR LAVAGE   Final    Special Requests NONE   Final    Gram Stain     Final    Value: ABUNDANT WBC PRESENT, PREDOMINANTLY PMN     NO SQUAMOUS EPITHELIAL CELLS SEEN     MODERATE GRAM POSITIVE COCCI     IN PAIRS RARE GRAM NEGATIVE RODS   Culture     Final    Value: MODERATE HAEMOPHILUS PARAINFLUENZAE     Note: BETA LACTAMASE NEGATIVE   Report Status 06/05/2012 FINAL   Final   CLOSTRIDIUM DIFFICILE BY PCR     Status: Normal   Collection Time   06/13/12  8:05 AM      Component Value Range Status Comment   C difficile by pcr NEGATIVE  NEGATIVE Final   URINE CULTURE  Status: Normal   Collection Time   06/16/12  9:00 PM      Component Value Range Status Comment   Specimen Description URINE, CLEAN CATCH   Final    Special Requests NONE   Final    Culture  Setup Time 06/16/2012 21:37   Final    Colony Count NO GROWTH   Final    Culture NO GROWTH   Final    Report Status 06/18/2012 FINAL   Final   CULTURE, BLOOD (ROUTINE X 2)     Status: Normal   Collection Time   06/16/12  9:02 PM      Component Value Range Status Comment   Specimen Description BLOOD RIGHT FOOT   Final    Special Requests BOTTLES DRAWN AEROBIC ONLY 10CC   Final    Culture  Setup Time 06/17/2012 14:17   Final    Culture NO GROWTH 5 DAYS   Final    Report Status 06/23/2012 FINAL   Final   CULTURE, BLOOD (ROUTINE X 2)     Status: Normal   Collection Time   06/16/12  9:07 PM      Component Value Range Status Comment    Specimen Description BLOOD LEFT FOOT   Final    Special Requests BOTTLES DRAWN AEROBIC AND ANAEROBIC 10CC EACH   Final    Culture  Setup Time 06/17/2012 14:17   Final    Culture NO GROWTH 5 DAYS   Final    Report Status 06/23/2012 FINAL   Final   CULTURE, RESPIRATORY     Status: Normal   Collection Time   06/18/12 11:41 PM      Component Value Range Status Comment   Specimen Description TRACHEAL ASPIRATE   Final    Special Requests NONE   Final    Gram Stain     Final    Value: ABUNDANT WBC PRESENT, PREDOMINANTLY PMN     RARE SQUAMOUS EPITHELIAL CELLS PRESENT     FEW GRAM POSITIVE COCCI IN CLUSTERS     RARE GRAM POSITIVE RODS     RARE GRAM POSITIVE COCCI IN CHAINS   Culture Non-Pathogenic Oropharyngeal-type Flora Isolated.   Final    Report Status 06/21/2012 FINAL   Final   CULTURE, BLOOD (ROUTINE X 2)     Status: Normal (Preliminary result)   Collection Time   06/22/12  3:28 AM      Component Value Range Status Comment   Specimen Description BLOOD RIGHT ARM   Final    Special Requests BOTTLES DRAWN AEROBIC AND ANAEROBIC 10CC EACH   Final    Culture  Setup Time 06/22/2012 11:37   Final    Culture     Final    Value:        BLOOD CULTURE RECEIVED NO GROWTH TO DATE CULTURE WILL BE HELD FOR 5 DAYS BEFORE ISSUING A FINAL NEGATIVE REPORT   Report Status PENDING   Incomplete   CULTURE, BLOOD (ROUTINE X 2)     Status: Normal (Preliminary result)   Collection Time   06/22/12  3:35 AM      Component Value Range Status Comment   Specimen Description BLOOD RIGHT HAND   Final    Special Requests BOTTLES DRAWN AEROBIC AND ANAEROBIC 10CC EACH   Final    Culture  Setup Time 06/22/2012 11:37   Final    Culture     Final    Value:        BLOOD CULTURE  RECEIVED NO GROWTH TO DATE CULTURE WILL BE HELD FOR 5 DAYS BEFORE ISSUING A FINAL NEGATIVE REPORT   Report Status PENDING   Incomplete   CULTURE, RESPIRATORY     Status: Normal   Collection Time   06/22/12  7:49 AM      Component Value Range Status  Comment   Specimen Description TRACHEAL ASPIRATE   Final    Special Requests NONE   Final    Gram Stain     Final    Value: NO WBC SEEN     NO SQUAMOUS EPITHELIAL CELLS SEEN     RARE GRAM NEGATIVE RODS   Culture MODERATE STENOTROPHOMONAS MALTOPHILIA   Final    Report Status 06/25/2012 FINAL   Final    Organism ID, Bacteria STENOTROPHOMONAS MALTOPHILIA   Final   URINE CULTURE     Status: Normal   Collection Time   06/22/12  5:12 PM      Component Value Range Status Comment   Specimen Description URINE, CATHETERIZED   Final    Special Requests NONE   Final    Culture  Setup Time 06/23/2012 11:39   Final    Colony Count NO GROWTH   Final    Culture NO GROWTH   Final    Report Status 06/24/2012 FINAL   Final     Anti-infectives:  Anti-infectives     Start     Dose/Rate Route Frequency Ordered Stop   06/24/12 1800   levofloxacin (LEVAQUIN) IVPB 750 mg        750 mg 100 mL/hr over 90 Minutes Intravenous Every 24 hours 06/24/12 1539 07/04/12 1759   06/23/12 1000   fluconazole (DIFLUCAN) IVPB 100 mg        100 mg 50 mL/hr over 60 Minutes Intravenous Every 24 hours 06/22/12 1632     06/23/12 0000   vancomycin (VANCOCIN) 1,500 mg in sodium chloride 0.9 % 500 mL IVPB  Status:  Discontinued        1,500 mg 250 mL/hr over 120 Minutes Intravenous Every 24 hours 06/22/12 1145 06/24/12 1539   06/22/12 1730   fluconazole (DIFLUCAN) IVPB 200 mg        200 mg 100 mL/hr over 60 Minutes Intravenous  Once 06/22/12 1630 06/22/12 1852   06/19/12 2200   vancomycin (VANCOCIN) IVPB 1000 mg/200 mL premix  Status:  Discontinued        1,000 mg 200 mL/hr over 60 Minutes Intravenous Every 12 hours 06/19/12 0940 06/22/12 1123   06/19/12 1000   vancomycin (VANCOCIN) 2,000 mg in sodium chloride 0.9 % 500 mL IVPB        2,000 mg 250 mL/hr over 120 Minutes Intravenous  Once 06/19/12 0940 06/19/12 1300   06/19/12 0930   ceFEPIme (MAXIPIME) 2 g in dextrose 5 % 50 mL IVPB  Status:  Discontinued        2  g 100 mL/hr over 30 Minutes Intravenous 3 times per day 06/19/12 0904 06/24/12 1539   06/04/12 1000   vancomycin (VANCOCIN) IVPB 1000 mg/200 mL premix  Status:  Discontinued        1,000 mg 200 mL/hr over 60 Minutes Intravenous Every 12 hours 06/04/12 0837 06/05/12 1045   06/03/12 1800   vancomycin (VANCOCIN) 1,500 mg in sodium chloride 0.9 % 500 mL IVPB  Status:  Discontinued        1,500 mg 250 mL/hr over 120 Minutes Intravenous Every 24 hours 06/02/12 1428 06/04/12 0837   06/02/12  1800   vancomycin (VANCOCIN) 2,000 mg in sodium chloride 0.9 % 500 mL IVPB        2,000 mg 250 mL/hr over 120 Minutes Intravenous  Once 06/02/12 1428 06/02/12 2024   06/02/12 1400   cefTAZidime (FORTAZ) 1 g in dextrose 5 % 50 mL IVPB        1 g 100 mL/hr over 30 Minutes Intravenous 3 times per day 06/02/12 1349 06/12/12 1610          Best Practice/Protocols:  VTE Prophylaxis: Lovenox (coumadin also) GI Prophylaxis: Proton Pump Inhibitor Continous Sedation  Consults: Treatment Team:  Melvenia Beam, MD    Events:  Subjective:    Overnight Issues: Patient has not been able to wean much lately.  He would be on trach collar.  Has Stenotrophomonas in respiratory culture.  On Levoquin for this infection.  Objective:  Vital signs for last 24 hours: Temp:  [97.8 F (36.6 C)-100.7 F (38.2 C)] 100.1 F (37.8 C) (09/25 0300) Pulse Rate:  [73-111] 86  (09/25 0800) Resp:  [20-28] 22  (09/25 0800) BP: (76-144)/(42-59) 101/43 mmHg (09/25 0800) SpO2:  [91 %-99 %] 94 % (09/25 0800) FiO2 (%):  [46.5 %-66.2 %] 46.8 % (09/25 0848) Weight:  [119.3 kg (263 lb 0.1 oz)] 119.3 kg (263 lb 0.1 oz) (09/25 0200)  Hemodynamic parameters for last 24 hours:    Intake/Output from previous day: 09/24 0701 - 09/25 0700 In: 2177.8 [I.V.:1025.8; NG/GT:760; IV Piggyback:352] Out: 3565 [Urine:3290; Stool:275]  Intake/Output this shift: Total I/O In: 40 [I.V.:20; NG/GT:20] Out: -   Vent settings for last 24  hours: Vent Mode:  [-] CPAP FiO2 (%):  [46.5 %-66.2 %] 46.8 % Set Rate:  [22 bmp] 22 bmp Vt Set:  [650 mL] 650 mL PEEP:  [4.6 cmH20-5.2 cmH20] 4.6 cmH20 Pressure Support:  [10 cmH20] 10 cmH20 Plateau Pressure:  [14 cmH20-19 cmH20] 14 cmH20  Physical Exam:  General: no respiratory distress and arousable, but did surprisingly well when switched to CP/PS Neuro: RASS -1 and agitated Resp: diminished breath sounds bibasilar GI: soft, nontender, BS WNL, no r/g and Tolerating trickle tube feedings currently, Extremities: edema 3+ and Good pulses globally  Results for orders placed during the hospital encounter of 05/31/12 (from the past 24 hour(s))  GLUCOSE, CAPILLARY     Status: Abnormal   Collection Time   06/25/12 12:01 PM      Component Value Range   Glucose-Capillary 173 (*) 70 - 99 mg/dL  GLUCOSE, CAPILLARY     Status: Abnormal   Collection Time   06/25/12  3:38 PM      Component Value Range   Glucose-Capillary 184 (*) 70 - 99 mg/dL   Comment 1 Notify RN     Comment 2 Documented in Chart    GLUCOSE, CAPILLARY     Status: Abnormal   Collection Time   06/25/12  7:43 PM      Component Value Range   Glucose-Capillary 207 (*) 70 - 99 mg/dL   Comment 1 Notify RN     Comment 2 Documented in Chart    GLUCOSE, CAPILLARY     Status: Abnormal   Collection Time   06/26/12 12:12 AM      Component Value Range   Glucose-Capillary 141 (*) 70 - 99 mg/dL  GLUCOSE, CAPILLARY     Status: Abnormal   Collection Time   06/26/12  3:43 AM      Component Value Range   Glucose-Capillary 155 (*) 70 -  99 mg/dL  PROTIME-INR     Status: Abnormal   Collection Time   06/26/12  3:55 AM      Component Value Range   Prothrombin Time 24.4 (*) 11.6 - 15.2 seconds   INR 2.32 (*) 0.00 - 1.49  CBC     Status: Abnormal   Collection Time   06/26/12  3:55 AM      Component Value Range   WBC 8.1  4.0 - 10.5 K/uL   RBC 3.11 (*) 4.22 - 5.81 MIL/uL   Hemoglobin 8.9 (*) 13.0 - 17.0 g/dL   HCT 16.1 (*) 09.6 - 04.5  %   MCV 90.4  78.0 - 100.0 fL   MCH 28.6  26.0 - 34.0 pg   MCHC 31.7  30.0 - 36.0 g/dL   RDW 40.9 (*) 81.1 - 91.4 %   Platelets 209  150 - 400 K/uL  BASIC METABOLIC PANEL     Status: Abnormal   Collection Time   06/26/12  3:55 AM      Component Value Range   Sodium 139  135 - 145 mEq/L   Potassium 3.2 (*) 3.5 - 5.1 mEq/L   Chloride 107  96 - 112 mEq/L   CO2 24  19 - 32 mEq/L   Glucose, Bld 163 (*) 70 - 99 mg/dL   BUN 56 (*) 6 - 23 mg/dL   Creatinine, Ser 7.82 (*) 0.50 - 1.35 mg/dL   Calcium 7.9 (*) 8.4 - 10.5 mg/dL   GFR calc non Af Amer 53 (*) >90 mL/min   GFR calc Af Amer 62 (*) >90 mL/min     Assessment/Plan:   NEURO  Altered Mental Status:  obtundation and sedation   Plan: Back off on sedation as we try to wean from the ventilator  PULM  Atelectasis/collapse (focal and LUL) Pneumonia: hospital acquired (not ventilator-associated) Stenotrophomonas   Plan: CPM  CARDIO  Sinus Tachycardia   Plan: No specific treatment  RENAL  Azotemia   Plan: Rehydrate as tolerated  GI  No specific problems   Plan: Increase tube feeding  ID  Pneumonia (hospital acquired (not ventilator-associated) As mentioned before)   Plan: Continue antibiotics  HEME  Anemia acute blood loss anemia and anemia of critical illness)   Plan: Does not need blood at this time.  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  If so then trach does not have to be replaced.  BUN/creat are elevated but must be from volume contraction.  Getting some free water.    LOS: 26 days   Additional comments:I reviewed the patient's new clinical lab test results. cbc/bmet and I reviewed the patients new imaging test results. cxr  Critical Care Total Time*: 30 Minutes  Riely Baskett O 06/26/2012  *Care during the described time  interval was provided by me and/or other providers on the critical care team.  I have reviewed this patient's available data, including medical history, events of note, physical examination and test results as part of my evaluation.

## 2012-06-26 NOTE — Progress Notes (Signed)
PT Cancellation Note  Treatment cancelled today due to medical issues with patient which prohibited therapy.  Pt weaning all morning with attempts at trach collar.  Pt has been off sedation since 8 am.  However, pt extremely lethargic at this time with decreased response to stimulation.  Will hold PT session today and follow-up 06/27/12.  Britiany Silbernagel M 06/26/2012, 12:22 PM  06/26/2012 Cephus Shelling, PT, DPT [phone]

## 2012-06-26 NOTE — Progress Notes (Signed)
ANTICOAG CONSULT NOTE - FOLLOW UP  Pharmacy Consult for Coumadin Indication: RUE DVT  No Known Allergies  Patient Measurements: Height: 6\' 3"  (190.5 cm) Weight: 263 lb 0.1 oz (119.3 kg) IBW/kg (Calculated) : 84.5   Vital Signs: Temp: 100.1 F (37.8 C) (09/25 0300) Temp src: Oral (09/25 0300) BP: 104/50 mmHg (09/25 1000) Pulse Rate: 85  (09/25 1000) Intake/Output from previous day: 09/24 0701 - 09/25 0700 In: 2177.8 [I.V.:1025.8; NG/GT:760; IV Piggyback:352] Out: 3565 [Urine:3290; Stool:275] Intake/Output from this shift: Total I/O In: 120 [I.V.:60; NG/GT:60] Out: 425 [Urine:425]  Labs:  [facility] 06/26/12 0355 06/25/12 0513 06/24/12 2342 06/24/12 1805 06/24/12 1000  WBC 8.1 9.4 -- -- 9.1  HGB 8.9* 8.0* -- 7.0* --  PLT 209 231 -- -- 244  LABCREA -- -- -- -- --  CREATININE 1.36* 1.42* 1.44* -- --   Estimated Creatinine Clearance: 76.4 ml/min (by C-G formula based on Cr of 1.36).  Assessment: 66 yo male continues on lovenox per MD and Coumadin per Rx for RUE DVT.  INR today (2.32) is within goal range.  Day 5/5 overlap with lovenox.  INR is levelling off. No bleeding noted per RN; s/p 2 units PRBC 8/23 with hgb of 8.9 today, plt stable.   Goal of Therapy:  INR = 2-3  Plan:  1. Coumadin 3 mg PO x 1 tonight 2. Continue daily INR 3. Will need to watch CBC closely. 4.  Consider dc lovenox.   Talbert Cage, PharmD Clinical Pharmacist Pgr [phone]  06/26/2012 11:18 AM

## 2012-06-27 LAB — BASIC METABOLIC PANEL
BUN: 51 mg/dL — ABNORMAL HIGH (ref 6–23)
Chloride: 103 mEq/L (ref 96–112)
GFR calc Af Amer: 69 mL/min — ABNORMAL LOW (ref >90)
GFR calc non Af Amer: 60 mL/min — ABNORMAL LOW (ref >90)
Potassium: 3.2 mEq/L — ABNORMAL LOW (ref 3.5–5.1)

## 2012-06-27 LAB — CBC WITH DIFFERENTIAL/PLATELET
Basophils Absolute: 0 10*3/uL (ref 0.0–0.1)
Basophils Relative: 0 % (ref 0–1)
Eosinophils Absolute: 0.2 10*3/uL (ref 0.0–0.7)
MCH: 28.7 pg (ref 26.0–34.0)
MCHC: 31.8 g/dL (ref 30.0–36.0)
Monocytes Relative: 12 % (ref 3–12)
Neutro Abs: 8.2 10*3/uL — ABNORMAL HIGH (ref 1.7–7.7)
Neutrophils Relative %: 76 % (ref 43–77)
Platelets: 231 10*3/uL (ref 150–400)
RDW: 15.8 % — ABNORMAL HIGH (ref 11.5–15.5)

## 2012-06-27 LAB — PROTIME-INR
INR: 2.29 — ABNORMAL HIGH (ref 0.00–1.49)
Prothrombin Time: 24.2 seconds — ABNORMAL HIGH (ref 11.6–15.2)

## 2012-06-27 LAB — GLUCOSE, CAPILLARY: Glucose-Capillary: 180 mg/dL — ABNORMAL HIGH (ref 70–99)

## 2012-06-27 MED ORDER — POTASSIUM CHLORIDE 20 MEQ/15ML (10%) PO LIQD
40.0000 meq | Freq: Once | ORAL | Status: AC
  Administered 2012-06-27: 40 meq via ORAL
  Filled 2012-06-27: qty 30

## 2012-06-27 MED ORDER — POTASSIUM CHLORIDE 20 MEQ/15ML (10%) PO LIQD
ORAL | Status: AC
  Filled 2012-06-27: qty 15

## 2012-06-27 MED ORDER — ALTEPLASE 100 MG IV SOLR
2.0000 mg | Freq: Once | INTRAVENOUS | Status: AC
  Administered 2012-06-27: 2 mg
  Filled 2012-06-27: qty 2

## 2012-06-27 MED ORDER — FREE WATER
200.0000 mL | Freq: Four times a day (QID) | Status: DC
  Administered 2012-06-27 – 2012-07-02 (×20): 200 mL

## 2012-06-27 MED ORDER — WARFARIN SODIUM 4 MG PO TABS
4.0000 mg | ORAL_TABLET | Freq: Once | ORAL | Status: AC
  Administered 2012-06-27: 4 mg via ORAL
  Filled 2012-06-27: qty 1
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
## 2012-06-27 NOTE — Progress Notes (Signed)
Pt placed back on a rate for aggitation

## 2012-06-27 NOTE — Progress Notes (Signed)
Patient ID: Alex Foster, male   DOB: 1946/09/06, 66 y.o.   MRN: [id number] Follow up - Trauma Critical Care  Patient Details:    Alex Foster is an 66 y.o. male.  Lines/tubes : PICC Triple Lumen 06/04/12 PICC Right Basilic (Active)  Indication for Insertion or Continuance of Line Prolonged intravenous therapies 06/26/2012  8:00 PM  Length mark (cm) 0 cm 06/04/2012  8:35 AM  Site Assessment Clean;Dry;Intact 06/26/2012  8:00 PM  Lumen #1 Status Infusing 06/26/2012  8:00 PM  Lumen #2 Status Infusing 06/26/2012  8:00 PM  Lumen #3 Status Infusing 06/26/2012  8:00 PM  Dressing Type Transparent;Occlusive 06/26/2012  8:00 PM  Dressing Status Clean;Dry;Intact;Antimicrobial disc in place 06/26/2012  8:00 PM  Line Care Connections checked and tightened 06/26/2012  8:00 PM  Dressing Intervention New dressing;Antimicrobial disc changed 06/26/2012  2:35 PM  Dressing Change Due 07/03/12 06/26/2012  8:00 PM     NG/OG Tube Nasogastric Left nare (Active)  Placement Verification Auscultation 06/26/2012  9:00 PM  Site Assessment Clean;Dry;Intact 06/26/2012  9:00 PM  Status Infusing tube feed 06/26/2012  9:00 PM  Drainage Appearance Bile 06/26/2012  8:00 PM  Gastric Residual 20 mL 06/26/2012  9:00 PM  Intake (mL) 20 mL 06/27/2012  7:00 AM     Rectal Tube/Pouch (Active)  Output (mL) 100 mL 06/25/2012 10:00 PM  Intake (mL) 30 mL 06/26/2012  8:00 PM     Urethral Catheter Straight-tip 14 Fr. (Active)  Indication for Insertion or Continuance of Catheter Urinary output monitoring;Acute urinary retention 06/26/2012  8:00 PM  Site Assessment Clean;Intact 06/26/2012  8:00 PM  Collection Container Standard drainage bag 06/26/2012  8:00 PM  Securement Method Leg strap 06/26/2012  8:00 PM  Urinary Catheter Interventions Unclamped 06/26/2012  8:00 PM    Microbiology/Sepsis markers: Results for orders placed during the hospital encounter of 05/31/12  MRSA PCR SCREENING     Status: Abnormal   Collection Time   05/31/12  8:58 PM        Component Value Range Status Comment   MRSA by PCR INVALID RESULTS, SPECIMEN SENT FOR CULTURE (*) NEGATIVE Final   MRSA CULTURE     Status: Normal   Collection Time   05/31/12  8:58 PM      Component Value Range Status Comment   Specimen Description NOSE   Final    Special Requests NONE   Final    Culture     Final    Value: NO STAPHYLOCOCCUS AUREUS ISOLATED     Note: NO MRSA ISOLATED   Report Status 06/03/2012 FINAL   Final   CULTURE, RESPIRATORY     Status: Normal   Collection Time   06/02/12  4:07 PM      Component Value Range Status Comment   Specimen Description BRONCHIAL ALVEOLAR LAVAGE   Final    Special Requests NONE   Final    Gram Stain     Final    Value: ABUNDANT WBC PRESENT, PREDOMINANTLY PMN     NO SQUAMOUS EPITHELIAL CELLS SEEN     MODERATE GRAM POSITIVE COCCI     IN PAIRS RARE GRAM NEGATIVE RODS   Culture     Final    Value: MODERATE HAEMOPHILUS PARAINFLUENZAE     Note: BETA LACTAMASE NEGATIVE   Report Status 06/05/2012 FINAL   Final   CLOSTRIDIUM DIFFICILE BY PCR     Status: Normal   Collection Time   06/13/12  8:05 AM      Component  Value Range Status Comment   C difficile by pcr NEGATIVE  NEGATIVE Final   URINE CULTURE     Status: Normal   Collection Time   06/16/12  9:00 PM      Component Value Range Status Comment   Specimen Description URINE, CLEAN CATCH   Final    Special Requests NONE   Final    Culture  Setup Time 06/16/2012 21:37   Final    Colony Count NO GROWTH   Final    Culture NO GROWTH   Final    Report Status 06/18/2012 FINAL   Final   CULTURE, BLOOD (ROUTINE X 2)     Status: Normal   Collection Time   06/16/12  9:02 PM      Component Value Range Status Comment   Specimen Description BLOOD RIGHT FOOT   Final    Special Requests BOTTLES DRAWN AEROBIC ONLY 10CC   Final    Culture  Setup Time 06/17/2012 14:17   Final    Culture NO GROWTH 5 DAYS   Final    Report Status 06/23/2012 FINAL   Final   CULTURE, BLOOD (ROUTINE X 2)     Status:  Normal   Collection Time   06/16/12  9:07 PM      Component Value Range Status Comment   Specimen Description BLOOD LEFT FOOT   Final    Special Requests BOTTLES DRAWN AEROBIC AND ANAEROBIC 10CC EACH   Final    Culture  Setup Time 06/17/2012 14:17   Final    Culture NO GROWTH 5 DAYS   Final    Report Status 06/23/2012 FINAL   Final   CULTURE, RESPIRATORY     Status: Normal   Collection Time   06/18/12 11:41 PM      Component Value Range Status Comment   Specimen Description TRACHEAL ASPIRATE   Final    Special Requests NONE   Final    Gram Stain     Final    Value: ABUNDANT WBC PRESENT, PREDOMINANTLY PMN     RARE SQUAMOUS EPITHELIAL CELLS PRESENT     FEW GRAM POSITIVE COCCI IN CLUSTERS     RARE GRAM POSITIVE RODS     RARE GRAM POSITIVE COCCI IN CHAINS   Culture Non-Pathogenic Oropharyngeal-type Flora Isolated.   Final    Report Status 06/21/2012 FINAL   Final   CULTURE, BLOOD (ROUTINE X 2)     Status: Normal (Preliminary result)   Collection Time   06/22/12  3:28 AM      Component Value Range Status Comment   Specimen Description BLOOD RIGHT ARM   Final    Special Requests BOTTLES DRAWN AEROBIC AND ANAEROBIC 10CC EACH   Final    Culture  Setup Time 06/22/2012 11:37   Final    Culture     Final    Value:        BLOOD CULTURE RECEIVED NO GROWTH TO DATE CULTURE WILL BE HELD FOR 5 DAYS BEFORE ISSUING A FINAL NEGATIVE REPORT   Report Status PENDING   Incomplete   CULTURE, BLOOD (ROUTINE X 2)     Status: Normal (Preliminary result)   Collection Time   06/22/12  3:35 AM      Component Value Range Status Comment   Specimen Description BLOOD RIGHT HAND   Final    Special Requests BOTTLES DRAWN AEROBIC AND ANAEROBIC 10CC EACH   Final    Culture  Setup Time 06/22/2012 11:37   Final  Culture     Final    Value:        BLOOD CULTURE RECEIVED NO GROWTH TO DATE CULTURE WILL BE HELD FOR 5 DAYS BEFORE ISSUING A FINAL NEGATIVE REPORT   Report Status PENDING   Incomplete   CULTURE, RESPIRATORY      Status: Normal   Collection Time   06/22/12  7:49 AM      Component Value Range Status Comment   Specimen Description TRACHEAL ASPIRATE   Final    Special Requests NONE   Final    Gram Stain     Final    Value: NO WBC SEEN     NO SQUAMOUS EPITHELIAL CELLS SEEN     RARE GRAM NEGATIVE RODS   Culture MODERATE STENOTROPHOMONAS MALTOPHILIA   Final    Report Status 06/25/2012 FINAL   Final    Organism ID, Bacteria STENOTROPHOMONAS MALTOPHILIA   Final   URINE CULTURE     Status: Normal   Collection Time   06/22/12  5:12 PM      Component Value Range Status Comment   Specimen Description URINE, CATHETERIZED   Final    Special Requests NONE   Final    Culture  Setup Time 06/23/2012 11:39   Final    Colony Count NO GROWTH   Final    Culture NO GROWTH   Final    Report Status 06/24/2012 FINAL   Final     Anti-infectives:  Anti-infectives     Start     Dose/Rate Route Frequency Ordered Stop   06/24/12 1800   levofloxacin (LEVAQUIN) IVPB 750 mg        750 mg 100 mL/hr over 90 Minutes Intravenous Every 24 hours 06/24/12 1539 07/04/12 1759   06/23/12 1000   fluconazole (DIFLUCAN) IVPB 100 mg        100 mg 50 mL/hr over 60 Minutes Intravenous Every 24 hours 06/22/12 1632     06/23/12 0000   vancomycin (VANCOCIN) 1,500 mg in sodium chloride 0.9 % 500 mL IVPB  Status:  Discontinued        1,500 mg 250 mL/hr over 120 Minutes Intravenous Every 24 hours 06/22/12 1145 06/24/12 1539   06/22/12 1730   fluconazole (DIFLUCAN) IVPB 200 mg        200 mg 100 mL/hr over 60 Minutes Intravenous  Once 06/22/12 1630 06/22/12 1852   06/19/12 2200   vancomycin (VANCOCIN) IVPB 1000 mg/200 mL premix  Status:  Discontinued        1,000 mg 200 mL/hr over 60 Minutes Intravenous Every 12 hours 06/19/12 0940 06/22/12 1123   06/19/12 1000   vancomycin (VANCOCIN) 2,000 mg in sodium chloride 0.9 % 500 mL IVPB        2,000 mg 250 mL/hr over 120 Minutes Intravenous  Once 06/19/12 0940 06/19/12 1300   06/19/12  0930   ceFEPIme (MAXIPIME) 2 g in dextrose 5 % 50 mL IVPB  Status:  Discontinued        2 g 100 mL/hr over 30 Minutes Intravenous 3 times per day 06/19/12 0904 06/24/12 1539   06/04/12 1000   vancomycin (VANCOCIN) IVPB 1000 mg/200 mL premix  Status:  Discontinued        1,000 mg 200 mL/hr over 60 Minutes Intravenous Every 12 hours 06/04/12 0837 06/05/12 1045   06/03/12 1800   vancomycin (VANCOCIN) 1,500 mg in sodium chloride 0.9 % 500 mL IVPB  Status:  Discontinued  1,500 mg 250 mL/hr over 120 Minutes Intravenous Every 24 hours 06/02/12 1428 06/04/12 0837   06/02/12 1800   vancomycin (VANCOCIN) 2,000 mg in sodium chloride 0.9 % 500 mL IVPB        2,000 mg 250 mL/hr over 120 Minutes Intravenous  Once 06/02/12 1428 06/02/12 2024   06/02/12 1400   cefTAZidime (FORTAZ) 1 g in dextrose 5 % 50 mL IVPB        1 g 100 mL/hr over 30 Minutes Intravenous 3 times per day 06/02/12 1349 06/12/12 1308          Best Practice/Protocols:  VTE Prophylaxis: Lovenox (full dose) Intermittent Sedation  Consults: Treatment Team:  Melvenia Beam, MD   Studies:  Dg Chest Port 1 View  06/26/2012  *RADIOLOGY REPORT*  Clinical Data: Respiratory failure  PORTABLE CHEST - 1 VIEW  Comparison: 06/25/2012  Findings: Tracheostomy tube tip is above the carina.  There is a right arm PICC line with tip in the projection of the SVC. Nasogastric tube tip is in the stomach.  No significant change and bilateral pleural effusions and interstitial edema.  Multiple right- sided displaced rib fractures are again identified.  IMPRESSION:  1.  No change in aeration to the lungs compared with previous exam.   Original Report Authenticated By: Rosealee Albee, M.D.    Events:  Subjective:    Overnight Issues:   Objective:  Vital signs for last 24 hours: Temp:  [98.3 F (36.8 C)-100.6 F (38.1 C)] 99.6 F (37.6 C) (09/26 0400) Pulse Rate:  [76-105] 91  (09/26 0700) Resp:  [20-33] 22  (09/26 0700) BP:  (83-124)/(42-59) 104/48 mmHg (09/26 0700) SpO2:  [90 %-100 %] 96 % (09/26 0700) FiO2 (%):  [46.6 %-60 %] 47 % (09/26 0700) Weight:  [119.9 kg (264 lb 5.3 oz)] 119.9 kg (264 lb 5.3 oz) (09/26 0500)  Hemodynamic parameters for last 24 hours:    Intake/Output from previous day: 09/25 0701 - 09/26 0700 In: 2588.4 [I.V.:1678.4; NG/GT:880] Out: 2908 [Urine:2908]  Intake/Output this shift:    Vent settings for last 24 hours: Vent Mode:  [-] PRVC FiO2 (%):  [46.6 %-60 %] 47 % Set Rate:  [22 bmp] 22 bmp Vt Set:  [650 mL] 650 mL PEEP:  [4.4 cmH20-5 cmH20] 4.4 cmH20 Pressure Support:  [10 cmH20] 10 cmH20 Plateau Pressure:  [18 cmH20-21 cmH20] 18 cmH20  Physical Exam:  General: on vent HEENT/Neck: trach in place Resp: few rhonchi B CVS: RRR GI: soft, NT, +BS Neuro: awake on vent and F/C with RLE  Results for orders placed during the hospital encounter of 05/31/12 (from the past 24 hour(s))  GLUCOSE, CAPILLARY     Status: Abnormal   Collection Time   06/26/12  8:09 AM      Component Value Range   Glucose-Capillary 146 (*) 70 - 99 mg/dL  GLUCOSE, CAPILLARY     Status: Abnormal   Collection Time   06/26/12 11:54 AM      Component Value Range   Glucose-Capillary 233 (*) 70 - 99 mg/dL  GLUCOSE, CAPILLARY     Status: Abnormal   Collection Time   06/26/12  3:56 PM      Component Value Range   Glucose-Capillary 196 (*) 70 - 99 mg/dL   Comment 1 Notify RN     Comment 2 Documented in Chart    GLUCOSE, CAPILLARY     Status: Abnormal   Collection Time   06/26/12  7:51 PM  Component Value Range   Glucose-Capillary 207 (*) 70 - 99 mg/dL  PROTIME-INR     Status: Abnormal   Collection Time   06/27/12  5:00 AM      Component Value Range   Prothrombin Time 24.2 (*) 11.6 - 15.2 seconds   INR 2.29 (*) 0.00 - 1.49  CBC WITH DIFFERENTIAL     Status: Abnormal   Collection Time   06/27/12  5:00 AM      Component Value Range   WBC 10.8 (*) 4.0 - 10.5 K/uL   RBC 2.89 (*) 4.22 - 5.81 MIL/uL     Hemoglobin 8.3 (*) 13.0 - 17.0 g/dL   HCT 78.2 (*) 95.6 - 21.3 %   MCV 90.3  78.0 - 100.0 fL   MCH 28.7  26.0 - 34.0 pg   MCHC 31.8  30.0 - 36.0 g/dL   RDW 08.6 (*) 57.8 - 46.9 %   Platelets 231  150 - 400 K/uL   Neutrophils Relative 76  43 - 77 %   Neutro Abs 8.2 (*) 1.7 - 7.7 K/uL   Lymphocytes Relative 10 (*) 12 - 46 %   Lymphs Abs 1.1  0.7 - 4.0 K/uL   Monocytes Relative 12  3 - 12 %   Monocytes Absolute 1.3 (*) 0.1 - 1.0 K/uL   Eosinophils Relative 2  0 - 5 %   Eosinophils Absolute 0.2  0.0 - 0.7 K/uL   Basophils Relative 0  0 - 1 %   Basophils Absolute 0.0  0.0 - 0.1 K/uL  BASIC METABOLIC PANEL     Status: Abnormal   Collection Time   06/27/12  5:00 AM      Component Value Range   Sodium 137  135 - 145 mEq/L   Potassium 3.2 (*) 3.5 - 5.1 mEq/L   Chloride 103  96 - 112 mEq/L   CO2 23  19 - 32 mEq/L   Glucose, Bld 192 (*) 70 - 99 mg/dL   BUN 51 (*) 6 - 23 mg/dL   Creatinine, Ser 6.29  0.50 - 1.35 mg/dL   Calcium 8.1 (*) 8.4 - 10.5 mg/dL   GFR calc non Af Amer 60 (*) >90 mL/min   GFR calc Af Amer 69 (*) >90 mL/min    Assessment & Plan: Present on Admission:  .Multiple rib fractures .TBI (traumatic brain injury) .Extensive facial fractures .Leukocytosis .Lactic acidosis .Pleural effusion   LOS: 27 days   Additional comments:I reviewed the patient's new clinical lab test results. and recent CXR [facility]  VDRF -- continue weaning, did not last very long on HTC yesterday, off drips now.Will check on cuff pressure as it does not feel high just feeling balloon TBI w/ICC -- per NS. Supportive care, Ot/PT as tolerated.  CVA -- per NS  Multiple facial fxs -- Non-operative per ENT  Multiple bilateral rib fxs w/HTPX s/p bilateral CT  ABL anemia -- Hb down slightly but PLTs are up so doubt significant active bleeding, follow-up daily as on anticoagulation  UE DVT -- Lovenox + coumadin per pharmacy ID -- Levaquin for Stenotrophomonas PNA ARF -- crt improving daily  Cards --  Cardiology following for arrhythmias , on low dose Neo for pressure support and trying to wean this FEN --  may need PEG if not on HTC soon, decrease free water, replace K DM -- increased lantus with some improvement in CBG, SSI Critical Care Total Time*: 39 Minutes  Violeta Gelinas, MD, MPH, FACS Pager: [phone]  06/27/2012  *Care during the described time interval was provided by me and/or other providers on the critical care team.  I have reviewed this patient's available data, including medical history, events of note, physical examination and test results as part of my evaluation.

## 2012-06-27 NOTE — Progress Notes (Signed)
ANTICOAG CONSULT NOTE - FOLLOW UP  Pharmacy Consult for Coumadin Indication: RUE DVT  No Known Allergies  Patient Measurements: Height: 6\' 3"  (190.5 cm) Weight: 264 lb 5.3 oz (119.9 kg) IBW/kg (Calculated) : 84.5   Vital Signs: Temp: 99.3 F (37.4 C) (09/26 0700) Temp src: Axillary (09/26 0700) BP: 115/52 mmHg (09/26 1100) Pulse Rate: 103  (09/26 1100) Intake/Output from previous day: 09/25 0701 - 09/26 0700 In: 2588.4 [I.V.:1678.4; NG/GT:880] Out: 2908 [Urine:2908] Intake/Output from this shift: Total I/O In: 491.3 [I.V.:211.3; NG/GT:180; IV Piggyback:100] Out: 570 [Urine:570]  Labs:  [facility] 06/27/12 0500 06/26/12 0355 06/25/12 0513  WBC 10.8* 8.1 9.4  HGB 8.3* 8.9* 8.0*  PLT 231 209 231  LABCREA -- -- --  CREATININE 1.24 1.36* 1.42*   Estimated Creatinine Clearance: 84 ml/min (by C-G formula based on Cr of 1.24).  Assessment: 66 yo male continues on lovenox per MD and Coumadin per Rx for RUE DVT.  INR today remains within goal range.  Day 6/5 overlap with lovenox.  No active bleeding noted with slight decrease in hgb to 8.3 today, plt stable.   Goal of Therapy:  INR = 2-3  Plan:  1. Coumadin 4 mg PO x 1 tonight 2. Continue daily INR 3. Will need to watch CBC closely. 4.  Will dc lovenox.   Talbert Cage, PharmD Clinical Pharmacist Pgr [phone]  06/27/2012 11:23 AM

## 2012-06-27 NOTE — Progress Notes (Signed)
Pt still had sutures stabilizing tracheostomy that was placed on 06/18/12.  Protocol is to remove sutures seven days post trach placement.  Called MD and got order to remove sutures.  Upon removing sutures, the four suture sites had breakdown and pus in the breakdown.  Also, where the flange on the right side had been pressing on skin there was an ulceration the shape of the flange.  Notified and showed RN.  Cleaned site with peroxide and placed a clean drain sponge under flange.  Asked RN to pass on in report to make sure that the site and dressing stay clean and dry.  Will also report this to oncoming RT.

## 2012-06-27 NOTE — Progress Notes (Signed)
Speech Language Pathology Treatment Patient Details Name: Alex Foster MRN: [id number] DOB: 08-Nov-1945 Today's Date: 06/27/2012 Time: 4098-1191 SLP Time Calculation (min): 17 min  Assessment / Plan / Recommendation Clinical Impression  Treatment focused on cognitive skills development. Currently alert but on vent. Patient presented with behaviors today consistent with a Rancho Level III (localized response) characterized by periods of focused attention to name/task  in 25% of opportunitites; mouthing name when asked by clinician, increased eye opening when called by name. Max verbal, visual,and tactile cues provided for carryout of basic 1-step commands during a familiar ADL.  RN reported decreased agitation today with a few periods of restlessness. ? if lethargy playing a role in decreased Rancho Level today. Will continue to f/u.     SLP Plan  Continue with current plan of care    Pertinent Vitals/Pain n/a  SLP Goals  SLP Goals Potential to Achieve Goals: Good SLP Goal #1: Pt. will sustain attention to activity/speaker for 15 minutes with mod verbal cues. SLP Goal #1 - Progress: Progressing toward goal SLP Goal #2: Pt. will follow 1 step commands with 90% accuracy. SLP Goal #2 - Progress: Progressing toward goal SLP Goal #3: Pt. will increase spatial and situational orientation via yes/no head gestures with 75% with mod environmental cues.  SLP Goal #3 - Progress: Progressing toward goal  General Respiratory Status: Ventilator Behavior/Cognition: Lethargic;Requires cueing;Decreased sustained attention;Cooperative;Confused Oral Cavity - Dentition: Edentulous Patient Positioning: Upright in bed  Oral Cavity - Oral Hygiene     Treatment Treatment focused on: Cognition Skilled Treatment: max-total verba/ visual/ tactile cues during functional activities (see impression statement)   GO   Ferdinand Lango MA, CCC-SLP [phone]   Alex Foster 06/27/2012, 4:20 PM

## 2012-06-28 LAB — GLUCOSE, CAPILLARY
Glucose-Capillary: 143 mg/dL — ABNORMAL HIGH (ref 70–99)
Glucose-Capillary: 152 mg/dL — ABNORMAL HIGH (ref 70–99)
Glucose-Capillary: 172 mg/dL — ABNORMAL HIGH (ref 70–99)

## 2012-06-28 LAB — BASIC METABOLIC PANEL
BUN: 46 mg/dL — ABNORMAL HIGH (ref 6–23)
Chloride: 105 mEq/L (ref 96–112)
GFR calc Af Amer: 76 mL/min — ABNORMAL LOW (ref >90)
Glucose, Bld: 158 mg/dL — ABNORMAL HIGH (ref 70–99)
Potassium: 3.4 mEq/L — ABNORMAL LOW (ref 3.5–5.1)

## 2012-06-28 LAB — CULTURE, BLOOD (ROUTINE X 2)

## 2012-06-28 LAB — CBC
HCT: 25.5 % — ABNORMAL LOW (ref 39.0–52.0)
Hemoglobin: 8.2 g/dL — ABNORMAL LOW (ref 13.0–17.0)
RBC: 2.84 MIL/uL — ABNORMAL LOW (ref 4.22–5.81)
WBC: 8.5 10*3/uL (ref 4.0–10.5)

## 2012-06-28 LAB — PROTIME-INR: INR: 2.15 — ABNORMAL HIGH (ref 0.00–1.49)

## 2012-06-28 MED ORDER — FENTANYL CITRATE 0.05 MG/ML IJ SOLN
50.0000 ug | Freq: Four times a day (QID) | INTRAMUSCULAR | Status: DC | PRN
  Administered 2012-06-28 – 2012-07-01 (×5): 100 ug via INTRAVENOUS
  Administered 2012-07-02: 50 ug via INTRAVENOUS
  Administered 2012-07-03: 100 ug via INTRAVENOUS
  Administered 2012-07-03: 50 ug via INTRAVENOUS
  Administered 2012-07-06: 100 ug via INTRAVENOUS
  Administered 2012-07-07: 50 ug via INTRAVENOUS
  Administered 2012-07-08 – 2012-07-09 (×3): 100 ug via INTRAVENOUS
  Administered 2012-07-09: 50 ug via INTRAVENOUS
  Administered 2012-07-10 – 2012-07-16 (×9): 100 ug via INTRAVENOUS
  Administered 2012-07-19: 50 ug via INTRAVENOUS
  Administered 2012-07-20: 100 ug via INTRAVENOUS
  Administered 2012-07-21: 50 ug via INTRAVENOUS
  Administered 2012-07-21 – 2012-07-30 (×13): 100 ug via INTRAVENOUS
  Administered 2012-07-30 – 2012-07-31 (×2): 50 ug via INTRAVENOUS
  Administered 2012-08-01 – 2012-08-06 (×12): 100 ug via INTRAVENOUS
  Filled 2012-06-28 (×58): qty 2

## 2012-06-28 MED ORDER — FENTANYL CITRATE 0.05 MG/ML IJ SOLN
50.0000 ug | Freq: Once | INTRAMUSCULAR | Status: AC
  Administered 2012-06-28: 100 ug via INTRAVENOUS

## 2012-06-28 MED ORDER — WARFARIN SODIUM 5 MG PO TABS
5.0000 mg | ORAL_TABLET | Freq: Once | ORAL | Status: AC
  Administered 2012-06-28: 5 mg via ORAL
  Filled 2012-06-28: qty 1

## 2012-06-28 MED ORDER — FENTANYL CITRATE 0.05 MG/ML IJ SOLN
INTRAMUSCULAR | Status: AC
  Filled 2012-06-28: qty 2

## 2012-06-28 MED ORDER — FENTANYL CITRATE 0.05 MG/ML IJ SOLN
INTRAMUSCULAR | Status: AC
  Administered 2012-06-28: 100 ug
  Filled 2012-06-28: qty 2

## 2012-06-28 MED ORDER — COLLAGENASE 250 UNIT/GM EX OINT
TOPICAL_OINTMENT | Freq: Every day | CUTANEOUS | Status: DC
  Administered 2012-06-28 – 2012-07-08 (×11): via TOPICAL
  Filled 2012-06-28: qty 30

## 2012-06-28 NOTE — Progress Notes (Signed)
Follow up - Trauma and Critical Care  Patient Details:    Alex Foster is an 66 y.Foster. male.  Lines/tubes : PICC Triple Lumen 06/04/12 PICC Right Basilic (Active)  Indication for Insertion or Continuance of Line Prolonged intravenous therapies 06/27/2012  8:00 AM  Length mark (cm) 0 cm 06/04/2012  8:35 AM  Site Assessment Clean;Dry;Intact 06/28/2012  1:57 AM  Lumen #1 Status Infusing 06/28/2012  1:57 AM  Lumen #2 Status Flushed;Blood return noted 06/28/2012  1:57 AM  Lumen #3 Status Flushed;Blood return noted 06/28/2012  1:57 AM  Dressing Type Transparent 06/28/2012  1:57 AM  Dressing Status Clean;Dry;Antimicrobial disc in place 06/28/2012  1:57 AM  Line Care Connections checked and tightened 06/27/2012  8:00 PM  Dressing Intervention New dressing;Antimicrobial disc changed 06/26/2012  2:35 PM  Dressing Change Due 07/03/12 06/27/2012  8:00 PM     NG/OG Tube Nasogastric 16 Fr. Right nare (Active)  Placement Verification Auscultation 06/28/2012  6:00 AM     Rectal Tube/Pouch (Active)  Output (mL) 100 mL 06/25/2012 10:00 PM  Intake (mL) 30 mL 06/26/2012  8:00 PM     Urethral Catheter Straight-tip 14 Fr. (Active)  Indication for Insertion or Continuance of Catheter Urinary output monitoring 06/27/2012  8:00 PM  Site Assessment Clean;Intact 06/27/2012  8:00 PM  Collection Container Standard drainage bag 06/27/2012  8:00 PM  Securement Method Leg strap 06/27/2012  8:00 PM  Urinary Catheter Interventions Unclamped 06/27/2012  8:00 PM    Microbiology/Sepsis markers: Results for orders placed during the hospital encounter of 05/31/12  MRSA PCR SCREENING     Status: Abnormal   Collection Time   05/31/12  8:58 PM      Component Value Range Status Comment   MRSA by PCR INVALID RESULTS, SPECIMEN SENT FOR CULTURE (*) NEGATIVE Final   MRSA CULTURE     Status: Normal   Collection Time   05/31/12  8:58 PM      Component Value Range Status Comment   Specimen Description NOSE   Final    Special Requests NONE    Final    Culture     Final    Value: NO STAPHYLOCOCCUS AUREUS ISOLATED     Note: NO MRSA ISOLATED   Report Status 06/03/2012 FINAL   Final   CULTURE, RESPIRATORY     Status: Normal   Collection Time   06/02/12  4:07 PM      Component Value Range Status Comment   Specimen Description BRONCHIAL ALVEOLAR LAVAGE   Final    Special Requests NONE   Final    Gram Stain     Final    Value: ABUNDANT WBC PRESENT, PREDOMINANTLY PMN     NO SQUAMOUS EPITHELIAL CELLS SEEN     MODERATE GRAM POSITIVE COCCI     IN PAIRS RARE GRAM NEGATIVE RODS   Culture     Final    Value: MODERATE HAEMOPHILUS PARAINFLUENZAE     Note: BETA LACTAMASE NEGATIVE   Report Status 06/05/2012 FINAL   Final   CLOSTRIDIUM DIFFICILE BY PCR     Status: Normal   Collection Time   06/13/12  8:05 AM      Component Value Range Status Comment   C difficile by pcr NEGATIVE  NEGATIVE Final   URINE CULTURE     Status: Normal   Collection Time   06/16/12  9:00 PM      Component Value Range Status Comment   Specimen Description URINE, CLEAN CATCH   Final  Special Requests NONE   Final    Culture  Setup Time 06/16/2012 21:37   Final    Colony Count NO GROWTH   Final    Culture NO GROWTH   Final    Report Status 06/18/2012 FINAL   Final   CULTURE, BLOOD (ROUTINE X 2)     Status: Normal   Collection Time   06/16/12  9:02 PM      Component Value Range Status Comment   Specimen Description BLOOD RIGHT FOOT   Final    Special Requests BOTTLES DRAWN AEROBIC ONLY 10CC   Final    Culture  Setup Time 06/17/2012 14:17   Final    Culture NO GROWTH 5 DAYS   Final    Report Status 06/23/2012 FINAL   Final   CULTURE, BLOOD (ROUTINE X 2)     Status: Normal   Collection Time   06/16/12  9:07 PM      Component Value Range Status Comment   Specimen Description BLOOD LEFT FOOT   Final    Special Requests BOTTLES DRAWN AEROBIC AND ANAEROBIC 10CC EACH   Final    Culture  Setup Time 06/17/2012 14:17   Final    Culture NO GROWTH 5 DAYS   Final     Report Status 06/23/2012 FINAL   Final   CULTURE, RESPIRATORY     Status: Normal   Collection Time   06/18/12 11:41 PM      Component Value Range Status Comment   Specimen Description TRACHEAL ASPIRATE   Final    Special Requests NONE   Final    Gram Stain     Final    Value: ABUNDANT WBC PRESENT, PREDOMINANTLY PMN     RARE SQUAMOUS EPITHELIAL CELLS PRESENT     FEW GRAM POSITIVE COCCI IN CLUSTERS     RARE GRAM POSITIVE RODS     RARE GRAM POSITIVE COCCI IN CHAINS   Culture Non-Pathogenic Oropharyngeal-type Flora Isolated.   Final    Report Status 06/21/2012 FINAL   Final   CULTURE, BLOOD (ROUTINE X 2)     Status: Normal (Preliminary result)   Collection Time   06/22/12  3:28 AM      Component Value Range Status Comment   Specimen Description BLOOD RIGHT ARM   Final    Special Requests BOTTLES DRAWN AEROBIC AND ANAEROBIC 10CC EACH   Final    Culture  Setup Time 06/22/2012 11:37   Final    Culture     Final    Value:        BLOOD CULTURE RECEIVED NO GROWTH TO DATE CULTURE WILL BE HELD FOR 5 DAYS BEFORE ISSUING A FINAL NEGATIVE REPORT   Report Status PENDING   Incomplete   CULTURE, BLOOD (ROUTINE X 2)     Status: Normal (Preliminary result)   Collection Time   06/22/12  3:35 AM      Component Value Range Status Comment   Specimen Description BLOOD RIGHT HAND   Final    Special Requests BOTTLES DRAWN AEROBIC AND ANAEROBIC 10CC EACH   Final    Culture  Setup Time 06/22/2012 11:37   Final    Culture     Final    Value:        BLOOD CULTURE RECEIVED NO GROWTH TO DATE CULTURE WILL BE HELD FOR 5 DAYS BEFORE ISSUING A FINAL NEGATIVE REPORT   Report Status PENDING   Incomplete   CULTURE, RESPIRATORY     Status:  Normal   Collection Time   06/22/12  7:49 AM      Component Value Range Status Comment   Specimen Description TRACHEAL ASPIRATE   Final    Special Requests NONE   Final    Gram Stain     Final    Value: NO WBC SEEN     NO SQUAMOUS EPITHELIAL CELLS SEEN     RARE GRAM NEGATIVE RODS     Culture MODERATE STENOTROPHOMONAS MALTOPHILIA   Final    Report Status 06/25/2012 FINAL   Final    Organism ID, Bacteria STENOTROPHOMONAS MALTOPHILIA   Final   URINE CULTURE     Status: Normal   Collection Time   06/22/12  5:12 PM      Component Value Range Status Comment   Specimen Description URINE, CATHETERIZED   Final    Special Requests NONE   Final    Culture  Setup Time 06/23/2012 11:39   Final    Colony Count NO GROWTH   Final    Culture NO GROWTH   Final    Report Status 06/24/2012 FINAL   Final     Anti-infectives:  Anti-infectives     Start     Dose/Rate Route Frequency Ordered Stop   06/24/12 1800   levofloxacin (LEVAQUIN) IVPB 750 mg        750 mg 100 mL/hr over 90 Minutes Intravenous Every 24 hours 06/24/12 1539 07/04/12 1759   06/23/12 1000   fluconazole (DIFLUCAN) IVPB 100 mg        100 mg 50 mL/hr over 60 Minutes Intravenous Every 24 hours 06/22/12 1632     06/23/12 0000   vancomycin (VANCOCIN) 1,500 mg in sodium chloride 0.9 % 500 mL IVPB  Status:  Discontinued        1,500 mg 250 mL/hr over 120 Minutes Intravenous Every 24 hours 06/22/12 1145 06/24/12 1539   06/22/12 1730   fluconazole (DIFLUCAN) IVPB 200 mg        200 mg 100 mL/hr over 60 Minutes Intravenous  Once 06/22/12 1630 06/22/12 1852   06/19/12 2200   vancomycin (VANCOCIN) IVPB 1000 mg/200 mL premix  Status:  Discontinued        1,000 mg 200 mL/hr over 60 Minutes Intravenous Every 12 hours 06/19/12 0940 06/22/12 1123   06/19/12 1000   vancomycin (VANCOCIN) 2,000 mg in sodium chloride 0.9 % 500 mL IVPB        2,000 mg 250 mL/hr over 120 Minutes Intravenous  Once 06/19/12 0940 06/19/12 1300   06/19/12 0930   ceFEPIme (MAXIPIME) 2 g in dextrose 5 % 50 mL IVPB  Status:  Discontinued        2 g 100 mL/hr over 30 Minutes Intravenous 3 times per day 06/19/12 0904 06/24/12 1539   06/04/12 1000   vancomycin (VANCOCIN) IVPB 1000 mg/200 mL premix  Status:  Discontinued        1,000 mg 200 mL/hr over  60 Minutes Intravenous Every 12 hours 06/04/12 0837 06/05/12 1045   06/03/12 1800   vancomycin (VANCOCIN) 1,500 mg in sodium chloride 0.9 % 500 mL IVPB  Status:  Discontinued        1,500 mg 250 mL/hr over 120 Minutes Intravenous Every 24 hours 06/02/12 1428 06/04/12 0837   06/02/12 1800   vancomycin (VANCOCIN) 2,000 mg in sodium chloride 0.9 % 500 mL IVPB        2,000 mg 250 mL/hr over 120 Minutes Intravenous  Once 06/02/12 1428  06/02/12 2024   06/02/12 1400   cefTAZidime (FORTAZ) 1 g in dextrose 5 % 50 mL IVPB        1 g 100 mL/hr over 30 Minutes Intravenous 3 times per day 06/02/12 1349 06/12/12 2952          Best Practice/Protocols:  VTE Prophylaxis: Mechanical GI Prophylaxis: Proton Pump Inhibitor Not on continuoous sedation.  Getting Klonopin and Seroquel  Consults: Treatment Team:  Melvenia Beam, MD    Events:  Subjective:    Overnight Issues: Pulled out gastric tube last night.  Reinserted.  Still leaking from trach site.  May need to be replaced with longer trach.  Objective:  Vital signs for last 24 hours: Temp:  [99.8 F (37.7 C)-100.5 F (38.1 C)] 100.1 F (37.8 C) (09/27 0400) Pulse Rate:  [77-112] 99  (09/27 0700) Resp:  [18-34] 32  (09/27 0700) BP: (99-152)/(47-66) 131/64 mmHg (09/27 0700) SpO2:  [89 %-99 %] 91 % (09/27 0700) FiO2 (%):  [46.8 %-80 %] 46.9 % (09/27 0700)  Hemodynamic parameters for last 24 hours:    Intake/Output from previous day: 09/26 0701 - 09/27 0700 In: 1161.3 [I.V.:591.3; NG/GT:470; IV Piggyback:100] Out: 4180 [Urine:3730; Stool:450]  Intake/Output this shift:    Vent settings for last 24 hours: Vent Mode:  [-] PRVC FiO2 (%):  [46.8 %-80 %] 46.9 % Set Rate:  [22 bmp] 22 bmp Vt Set:  [650 mL] 650 mL PEEP:  [4.3 cmH20-5.3 cmH20] 5 cmH20 Pressure Support:  [10 cmH20] 10 cmH20 Plateau Pressure:  [19 cmH20-20 cmH20] 19 cmH20  Physical Exam:  General: Writhing in bed.  Just seems uncomfortable. Neuro: confused,  agitated, weakness left lower extremity and flaccid in the left arm HEENT/Neck: trach-clean, intact and but leaking air without volume lossstill leaking air Resp: diminished breath sounds bibasilar and bilaterally and rhonchi bibasilar and bilaterally GI: soft, nontender, BS WNL, no r/g and pulled out gastric tube, reinserted.  Tube feedings restarted Extremities: edema 2+ and Improved edema  Results for orders placed during the hospital encounter of 05/31/12 (from the past 24 hour(s))  GLUCOSE, CAPILLARY     Status: Abnormal   Collection Time   06/27/12  8:28 AM      Component Value Range   Glucose-Capillary 221 (*) 70 - 99 mg/dL  GLUCOSE, CAPILLARY     Status: Abnormal   Collection Time   06/27/12 12:08 PM      Component Value Range   Glucose-Capillary 208 (*) 70 - 99 mg/dL  GLUCOSE, CAPILLARY     Status: Abnormal   Collection Time   06/27/12  3:55 PM      Component Value Range   Glucose-Capillary 156 (*) 70 - 99 mg/dL   Comment 1 Notify RN     Comment 2 Documented in Chart    GLUCOSE, CAPILLARY     Status: Abnormal   Collection Time   06/27/12  7:52 PM      Component Value Range   Glucose-Capillary 180 (*) 70 - 99 mg/dL   Comment 1 Notify RN     Comment 2 Documented in Chart    GLUCOSE, CAPILLARY     Status: Abnormal   Collection Time   06/28/12 12:08 AM      Component Value Range   Glucose-Capillary 143 (*) 70 - 99 mg/dL  GLUCOSE, CAPILLARY     Status: Abnormal   Collection Time   06/28/12  4:14 AM      Component Value Range   Glucose-Capillary 135 (*)  70 - 99 mg/dL  PROTIME-INR     Status: Abnormal   Collection Time   06/28/12  4:45 AM      Component Value Range   Prothrombin Time 23.1 (*) 11.6 - 15.2 seconds   INR 2.15 (*) 0.00 - 1.49  CBC     Status: Abnormal   Collection Time   06/28/12  4:45 AM      Component Value Range   WBC 8.5  4.0 - 10.5 K/uL   RBC 2.84 (*) 4.22 - 5.81 MIL/uL   Hemoglobin 8.2 (*) 13.0 - 17.0 g/dL   HCT 16.1 (*) 09.6 - 04.5 %   MCV 89.8   78.0 - 100.0 fL   MCH 28.9  26.0 - 34.0 pg   MCHC 32.2  30.0 - 36.0 g/dL   RDW 40.9 (*) 81.1 - 91.4 %   Platelets 233  150 - 400 K/uL  BASIC METABOLIC PANEL     Status: Abnormal   Collection Time   06/28/12  4:45 AM      Component Value Range   Sodium 139  135 - 145 mEq/L   Potassium 3.4 (*) 3.5 - 5.1 mEq/L   Chloride 105  96 - 112 mEq/L   CO2 27  19 - 32 mEq/L   Glucose, Bld 158 (*) 70 - 99 mg/dL   BUN 46 (*) 6 - 23 mg/dL   Creatinine, Ser 7.82  0.50 - 1.35 mg/dL   Calcium 8.3 (*) 8.4 - 10.5 mg/dL   GFR calc non Af Amer 65 (*) >90 mL/min   GFR calc Af Amer 76 (*) >90 mL/min  GLUCOSE, CAPILLARY     Status: Abnormal   Collection Time   06/28/12  7:41 AM      Component Value Range   Glucose-Capillary 179 (*) 70 - 99 mg/dL     Assessment/Plan:   NEURO  Altered Mental Status:  agitation   Plan: Continue some sedation  PULM  Pneumonia: hospital acquired (not ventilator-associated) Stenotrophomonas   Plan: CPM with antibiotics, change to long distal trach.  CARDIO  Atrial Premature Beats (without hemodynamic compromise) and Sinus Tachycardia   Plan: No specific treatment  RENAL  BUN and creat improving.   Plan: Maintain volume status  GI  No problems.   Probably G-tube in the future   Plan: Continue tube feedings after trach change  ID  Pneumonia (hospital acquired (not ventilator-associated) Steno)   Plan: Continue antibiotics  HEME  Anemia anemia of chronic disease and anemia of critical illness)   Plan: Continue to follow every other day.  ENDO Glucose maintained between 135-179   Plan: CPM  Global Issues  The patient is not weaning as well as we would like.  Part of this is because of agitation, part is because of pulmonary disease.  Will possibly need G-tube in the future.    LOS: 28 days   Additional comments:I reviewed the patient's new clinical lab test results. cbc/bmet  Critical Care Total Time*: 30 Minutes  Alex Foster 06/28/2012  *Care during the  described time interval was provided by me and/or other providers on the critical care team.  I have reviewed this patient's available data, including medical history, events of note, physical examination and test results as part of my evaluation.

## 2012-06-28 NOTE — Progress Notes (Signed)
PT Cancellation Note  Treatment cancelled today due to pt just had his trach switched to a longer trach.  Spoke with RN who stated to hold treament this AM.  PT to check back in PM to see if he is more appropriate. Rollene Rotunda Maylin [place], PT, DPT [phone]   06/28/2012, 10:14 AM

## 2012-06-28 NOTE — Progress Notes (Signed)
SLP Cancellation Note (Late Note)  Treatment cancelled this am due to pt just had his trach switched to a longer trach. RN asked therapy to hold treatment this am. Plan to f/u 9/30.  Ferdinand Lango MA, CCC-SLP [phone]   Ferdinand Lango Meryl 06/28/2012, 3:04 PM
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
## 2012-06-28 NOTE — Progress Notes (Signed)
  At 0400, RN walked into room and found pt's NG tube had been pulled out.   Dr. Janee Morn notified about pt's increased agitation. Orders given for soft wrist restraint for R wrist. NG tube re-inserted. Placement confirmed by auscultation.   Will continue to monitor.  Arneta Cliche RN

## 2012-06-28 NOTE — Progress Notes (Signed)
ANTICOAG CONSULT NOTE - FOLLOW UP  Pharmacy Consult for Coumadin Indication: RUE DVT  No Known Allergies  Patient Measurements: Height: 6\' 3"  (190.5 cm) Weight: 264 lb 5.3 oz (119.9 kg) IBW/kg (Calculated) : 84.5   Vital Signs: Temp: 99.3 F (37.4 C) (09/27 1153) Temp src: Axillary (09/27 1153) BP: 100/53 mmHg (09/27 1100) Pulse Rate: 90  (09/27 1100) Intake/Output from previous day: 09/26 0701 - 09/27 0700 In: 1181.3 [I.V.:591.3; NG/GT:490; IV Piggyback:100] Out: 4180 [Urine:3730; Stool:450] Intake/Output from this shift: Total I/O In: 190 [I.V.:80; NG/GT:60; IV Piggyback:50] Out: 435 [Urine:435]  Labs:  [facility] 06/28/12 0445 06/27/12 0500 06/26/12 0355  WBC 8.5 10.8* 8.1  HGB 8.2* 8.3* 8.9*  PLT 233 231 209  [facility] -- -- --  CREATININE 1.15 1.24 1.36*   Estimated Creatinine Clearance: 90.6 ml/min (by C-G formula based on Cr of 1.15).  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  INR today remains within goal range.  Lovenox completed.  No active bleeding noted with slight decrease in hgb to 8.2 today, plt stable.   Goal of Therapy:  INR = 2-3  Plan:  1. Coumadin 5 mg PO x 1 tonight 2. Continue daily INR 3. Will need to watch CBC closely.   Talbert Cage, PharmD Clinical Pharmacist Pgr [phone]  06/28/2012 12:29 PM
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
## 2012-06-28 NOTE — Procedures (Signed)
#  8 regular length Shiley tracheostomy changed to #8 extra-long proximal Shiley tracheostomy tube without difficulty using a bougie guide.  Some bleeding as expected because of coumadinization.  Marta Lamas. Gae Bon, MD, FACS [phone] Trauma Surgeon

## 2012-06-28 NOTE — Clinical Social Work Note (Signed)
Clinical Social Worker continuing to follow patient for emotional support and discharge planning needs.  Patient remains on the ventilator, however he did switch to a longer trach today in hopes of progressing his weaning process.  CSW was present during PT session, however no family present at bedside.  Patient seems to be following most commands and tracking therapist to the other side of the bed.  PA/MD remain hopeful that patient will wean.  CSW will re-evaluate patient ability to wean on Monday giving adequate time with longer trach and begin Vent SNF search if needed.  Clinical Social Worker will continue to be available for support and discharge planning needs as needed.  Macario Golds, [place] [phone]

## 2012-06-29 ENCOUNTER — Inpatient Hospital Stay (HOSPITAL_COMMUNITY): Payer: Medicaid Other

## 2012-06-29 LAB — PROTIME-INR: INR: 2.37 — ABNORMAL HIGH (ref 0.00–1.49)

## 2012-06-29 LAB — GLUCOSE, CAPILLARY
Glucose-Capillary: 136 mg/dL — ABNORMAL HIGH (ref 70–99)
Glucose-Capillary: 156 mg/dL — ABNORMAL HIGH (ref 70–99)
Glucose-Capillary: 123 mg/dL — ABNORMAL HIGH (ref 70–99)
Glucose-Capillary: 145 mg/dL — ABNORMAL HIGH (ref 70–99)

## 2012-06-29 MED ORDER — WARFARIN SODIUM 3 MG PO TABS
3.0000 mg | ORAL_TABLET | Freq: Once | ORAL | Status: AC
  Administered 2012-06-29: 3 mg via ORAL
  Filled 2012-06-29: qty 1

## 2012-06-29 NOTE — Progress Notes (Signed)
Patient ID: Alex Foster, male   DOB: 07-Jun-1946, 66 y.o.   MRN: [id number] Follow up - Trauma and Critical Care  Patient Details:    Alex Foster is an 66 y.o. male..  Lines/tubes : PICC Triple Lumen 06/04/12 PICC Right Basilic (Active)  Indication for Insertion or Continuance of Line Prolonged intravenous therapies 06/27/2012  8:00 AM  Length mark (cm) 0 cm 06/04/2012  8:35 AM  Site Assessment Clean;Dry;Intact 06/28/2012  1:57 AM  Lumen #1 Status Infusing 06/28/2012  1:57 AM  Lumen #2 Status Flushed;Blood return noted 06/28/2012  1:57 AM  Lumen #3 Status Flushed;Blood return noted 06/28/2012  1:57 AM  Dressing Type Transparent 06/28/2012  1:57 AM  Dressing Status Clean;Dry;Antimicrobial disc in place 06/28/2012  1:57 AM  Line Care Connections checked and tightened 06/27/2012  8:00 PM  Dressing Intervention New dressing;Antimicrobial disc changed 06/26/2012  2:35 PM  Dressing Change Due 07/03/12 06/27/2012  8:00 PM     NG/OG Tube Nasogastric 16 Fr. Right nare (Active)  Placement Verification Auscultation 06/28/2012  6:00 AM     Rectal Tube/Pouch (Active)  Output (mL) 100 mL 06/25/2012 10:00 PM  Intake (mL) 30 mL 06/26/2012  8:00 PM     Urethral Catheter Straight-tip 14 Fr. (Active)  Indication for Insertion or Continuance of Catheter Urinary output monitoring 06/27/2012  8:00 PM  Site Assessment Clean;Intact 06/27/2012  8:00 PM  Collection Container Standard drainage bag 06/27/2012  8:00 PM  Securement Method Leg strap 06/27/2012  8:00 PM  Urinary Catheter Interventions Unclamped 06/27/2012  8:00 PM    Microbiology/Sepsis markers: Results for orders placed during the hospital encounter of 05/31/12  MRSA PCR SCREENING     Status: Abnormal   Collection Time   05/31/12  8:58 PM      Component Value Range Status Comment   MRSA by PCR INVALID RESULTS, SPECIMEN SENT FOR CULTURE (*) NEGATIVE Final   MRSA CULTURE     Status: Normal   Collection Time   05/31/12  8:58 PM      Component Value Range  Status Comment   Specimen Description NOSE   Final    Special Requests NONE   Final    Culture     Final    Value: NO STAPHYLOCOCCUS AUREUS ISOLATED     Note: NO MRSA ISOLATED   Report Status 06/03/2012 FINAL   Final   CULTURE, RESPIRATORY     Status: Normal   Collection Time   06/02/12  4:07 PM      Component Value Range Status Comment   Specimen Description BRONCHIAL ALVEOLAR LAVAGE   Final    Special Requests NONE   Final    Gram Stain     Final    Value: ABUNDANT WBC PRESENT, PREDOMINANTLY PMN     NO SQUAMOUS EPITHELIAL CELLS SEEN     MODERATE GRAM POSITIVE COCCI     IN PAIRS RARE GRAM NEGATIVE RODS   Culture     Final    Value: MODERATE HAEMOPHILUS PARAINFLUENZAE     Note: BETA LACTAMASE NEGATIVE   Report Status 06/05/2012 FINAL   Final   CLOSTRIDIUM DIFFICILE BY PCR     Status: Normal   Collection Time   06/13/12  8:05 AM      Component Value Range Status Comment   C difficile by pcr NEGATIVE  NEGATIVE Final   URINE CULTURE     Status: Normal   Collection Time   06/16/12  9:00 PM  Component Value Range Status Comment   Specimen Description URINE, CLEAN CATCH   Final    Special Requests NONE   Final    Culture  Setup Time 06/16/2012 21:37   Final    Colony Count NO GROWTH   Final    Culture NO GROWTH   Final    Report Status 06/18/2012 FINAL   Final   CULTURE, BLOOD (ROUTINE X 2)     Status: Normal   Collection Time   06/16/12  9:02 PM      Component Value Range Status Comment   Specimen Description BLOOD RIGHT FOOT   Final    Special Requests BOTTLES DRAWN AEROBIC ONLY 10CC   Final    Culture  Setup Time 06/17/2012 14:17   Final    Culture NO GROWTH 5 DAYS   Final    Report Status 06/23/2012 FINAL   Final   CULTURE, BLOOD (ROUTINE X 2)     Status: Normal   Collection Time   06/16/12  9:07 PM      Component Value Range Status Comment   Specimen Description BLOOD LEFT FOOT   Final    Special Requests BOTTLES DRAWN AEROBIC AND ANAEROBIC 10CC EACH   Final     Culture  Setup Time 06/17/2012 14:17   Final    Culture NO GROWTH 5 DAYS   Final    Report Status 06/23/2012 FINAL   Final   CULTURE, RESPIRATORY     Status: Normal   Collection Time   06/18/12 11:41 PM      Component Value Range Status Comment   Specimen Description TRACHEAL ASPIRATE   Final    Special Requests NONE   Final    Gram Stain     Final    Value: ABUNDANT WBC PRESENT, PREDOMINANTLY PMN     RARE SQUAMOUS EPITHELIAL CELLS PRESENT     FEW GRAM POSITIVE COCCI IN CLUSTERS     RARE GRAM POSITIVE RODS     RARE GRAM POSITIVE COCCI IN CHAINS   Culture Non-Pathogenic Oropharyngeal-type Flora Isolated.   Final    Report Status 06/21/2012 FINAL   Final   CULTURE, BLOOD (ROUTINE X 2)     Status: Normal   Collection Time   06/22/12  3:28 AM      Component Value Range Status Comment   Specimen Description BLOOD RIGHT ARM   Final    Special Requests BOTTLES DRAWN AEROBIC AND ANAEROBIC 10CC EACH   Final    Culture  Setup Time 06/22/2012 11:37   Final    Culture NO GROWTH 5 DAYS   Final    Report Status 06/28/2012 FINAL   Final   CULTURE, BLOOD (ROUTINE X 2)     Status: Normal   Collection Time   06/22/12  3:35 AM      Component Value Range Status Comment   Specimen Description BLOOD RIGHT HAND   Final    Special Requests BOTTLES DRAWN AEROBIC AND ANAEROBIC 10CC EACH   Final    Culture  Setup Time 06/22/2012 11:37   Final    Culture NO GROWTH 5 DAYS   Final    Report Status 06/28/2012 FINAL   Final   CULTURE, RESPIRATORY     Status: Normal   Collection Time   06/22/12  7:49 AM      Component Value Range Status Comment   Specimen Description TRACHEAL ASPIRATE   Final    Special Requests NONE   Final  Gram Stain     Final    Value: NO WBC SEEN     NO SQUAMOUS EPITHELIAL CELLS SEEN     RARE GRAM NEGATIVE RODS   Culture MODERATE STENOTROPHOMONAS MALTOPHILIA   Final    Report Status 06/25/2012 FINAL   Final    Organism ID, Bacteria STENOTROPHOMONAS MALTOPHILIA   Final   URINE  CULTURE     Status: Normal   Collection Time   06/22/12  5:12 PM      Component Value Range Status Comment   Specimen Description URINE, CATHETERIZED   Final    Special Requests NONE   Final    Culture  Setup Time 06/23/2012 11:39   Final    Colony Count NO GROWTH   Final    Culture NO GROWTH   Final    Report Status 06/24/2012 FINAL   Final     Anti-infectives:  Anti-infectives     Start     Dose/Rate Route Frequency Ordered Stop   06/24/12 1800   levofloxacin (LEVAQUIN) IVPB 750 mg        750 mg 100 mL/hr over 90 Minutes Intravenous Every 24 hours 06/24/12 1539 07/04/12 1759   06/23/12 1000   fluconazole (DIFLUCAN) IVPB 100 mg        100 mg 50 mL/hr over 60 Minutes Intravenous Every 24 hours 06/22/12 1632     06/23/12 0000   vancomycin (VANCOCIN) 1,500 mg in sodium chloride 0.9 % 500 mL IVPB  Status:  Discontinued        1,500 mg 250 mL/hr over 120 Minutes Intravenous Every 24 hours 06/22/12 1145 06/24/12 1539   06/22/12 1730   fluconazole (DIFLUCAN) IVPB 200 mg        200 mg 100 mL/hr over 60 Minutes Intravenous  Once 06/22/12 1630 06/22/12 1852   06/19/12 2200   vancomycin (VANCOCIN) IVPB 1000 mg/200 mL premix  Status:  Discontinued        1,000 mg 200 mL/hr over 60 Minutes Intravenous Every 12 hours 06/19/12 0940 06/22/12 1123   06/19/12 1000   vancomycin (VANCOCIN) 2,000 mg in sodium chloride 0.9 % 500 mL IVPB        2,000 mg 250 mL/hr over 120 Minutes Intravenous  Once 06/19/12 0940 06/19/12 1300   06/19/12 0930   ceFEPIme (MAXIPIME) 2 g in dextrose 5 % 50 mL IVPB  Status:  Discontinued        2 g 100 mL/hr over 30 Minutes Intravenous 3 times per day 06/19/12 0904 06/24/12 1539   06/04/12 1000   vancomycin (VANCOCIN) IVPB 1000 mg/200 mL premix  Status:  Discontinued        1,000 mg 200 mL/hr over 60 Minutes Intravenous Every 12 hours 06/04/12 0837 06/05/12 1045   06/03/12 1800   vancomycin (VANCOCIN) 1,500 mg in sodium chloride 0.9 % 500 mL IVPB  Status:   Discontinued        1,500 mg 250 mL/hr over 120 Minutes Intravenous Every 24 hours 06/02/12 1428 06/04/12 0837   06/02/12 1800   vancomycin (VANCOCIN) 2,000 mg in sodium chloride 0.9 % 500 mL IVPB        2,000 mg 250 mL/hr over 120 Minutes Intravenous  Once 06/02/12 1428 06/02/12 2024   06/02/12 1400   cefTAZidime (FORTAZ) 1 g in dextrose 5 % 50 mL IVPB        1 g 100 mL/hr over 30 Minutes Intravenous 3 times per day 06/02/12 1349 06/12/12 4540  Best Practice/Protocols:  VTE Prophylaxis: Mechanical GI Prophylaxis: Proton Pump Inhibitor Not on continuoous sedation.  Getting Klonopin and Seroquel  Consults: Treatment Team:  Melvenia Beam, MD    Events:  Subjective:    Overnight Issues: none  Objective:  Vital signs for last 24 hours: Temp:  [99 F (37.2 C)-100.7 F (38.2 C)] 99 F (37.2 C) (09/28 0400) Pulse Rate:  [74-102] 81  (09/28 0700) Resp:  [18-28] 22  (09/28 0700) BP: (91-140)/(41-82) 140/62 mmHg (09/28 0700) SpO2:  [91 %-99 %] 98 % (09/28 0700) FiO2 (%):  [46.4 %-50 %] 50 % (09/28 0738) Weight:  [251 lb 12.3 oz (114.2 kg)] 251 lb 12.3 oz (114.2 kg) (09/28 0500)  Hemodynamic parameters for last 24 hours:    Intake/Output from previous day: 09/27 0701 - 09/28 0700 In: 2015 [I.V.:480; NG/GT:860; IV Piggyback:500] Out: 2590 [Urine:2590]  Intake/Output this shift:    Vent settings for last 24 hours: Vent Mode:  [-] PSV FiO2 (%):  [46.4 %-50 %] 50 % Set Rate:  [22 bmp] 22 bmp Vt Set:  [650 mL] 650 mL PEEP:  [4.7 cmH20-5 cmH20] 5 cmH20 Pressure Support:  [12 cmH20] 12 cmH20 Plateau Pressure:  [14 cmH20-18 cmH20] 14 cmH20  Physical Exam:  General: resting in bed but moves to name, stills seems uncomfortable. Neuro: confused, agitated and flaccid in the left arm HEENT/Neck: trach-clean, intact and no leaking now Resp: diminished breath sounds bilaterally and rhonchi bilaterally GI: soft, nontender, BS WNL, no r/g and NGT back in place,  rectal tube in place Extremities: edema 2+ and Improved edema  Results for orders placed during the hospital encounter of 05/31/12 (from the past 24 hour(s))  GLUCOSE, CAPILLARY     Status: Abnormal   Collection Time   06/28/12 12:15 PM      Component Value Range   Glucose-Capillary 172 (*) 70 - 99 mg/dL  GLUCOSE, CAPILLARY     Status: Abnormal   Collection Time   06/28/12  3:31 PM      Component Value Range   Glucose-Capillary 181 (*) 70 - 99 mg/dL   Comment 1 Notify RN     Comment 2 Documented in Chart    GLUCOSE, CAPILLARY     Status: Abnormal   Collection Time   06/28/12  8:26 PM      Component Value Range   Glucose-Capillary 152 (*) 70 - 99 mg/dL   Comment 1 Notify RN     Comment 2 Documented in Chart    GLUCOSE, CAPILLARY     Status: Abnormal   Collection Time   06/28/12 11:48 PM      Component Value Range   Glucose-Capillary 145 (*) 70 - 99 mg/dL   Comment 1 Notify RN     Comment 2 Documented in Chart    GLUCOSE, CAPILLARY     Status: Abnormal   Collection Time   06/29/12  2:52 AM      Component Value Range   Glucose-Capillary 136 (*) 70 - 99 mg/dL  PROTIME-INR     Status: Abnormal   Collection Time   06/29/12  2:55 AM      Component Value Range   Prothrombin Time 24.8 (*) 11.6 - 15.2 seconds   INR 2.37 (*) 0.00 - 1.49     Assessment/Plan:   NEURO  Altered Mental Status:  agitation   Plan: Continue some sedation  PULM  Pneumonia: hospital acquired (not ventilator-associated) Stenotrophomonas   Plan: CPM with antibiotics, trach changed yesterday, cxr  pending this am.  CARDIO  Atrial Premature Beats (without hemodynamic compromise) and Sinus Tachycardia   Plan: No specific treatment  RENAL  BUN and creat improving.   Plan: Maintain volume status  GI  No problems.   Probably G-tube in the future   Plan: Continue tube feedings after trach change  ID  Pneumonia (hospital acquired (not ventilator-associated) Steno)   Plan: Continue antibiotics  HEME  Anemia  anemia of chronic disease and anemia of critical illness)   Plan: Continue to follow every other day.  ENDO Glucose maintained between 135-179   Plan: CPM  Global Issues  The patient is not weaning as well as we would like.  Part of this is because of agitation, part is because of pulmonary disease.  Will possibly need G-tube in the future.    LOS: 29 days   Additional comments:I reviewed the patient's new clinical lab test results. cbc/bmet  Critical Care Total Time*: 30 Minutes  WHITE, ELIZABETH 06/29/2012  *Care during the described time interval was provided by me and/or other providers on the critical care team.  I have reviewed this patient's available data, including medical history, events of note, physical examination and test results as part of my evaluation.

## 2012-06-29 NOTE — Progress Notes (Signed)
Agree with above.  Alex Foster. [name], MD, [facility]  06/29/2012 1:17 PM

## 2012-06-29 NOTE — Progress Notes (Signed)
ANTICOAG CONSULT NOTE - FOLLOW UP  Pharmacy Consult for Coumadin Indication: RUE DVT  No Known Allergies  Patient Measurements: Height: 6\' 3"  (190.5 cm) Weight: 251 lb 12.3 oz (114.2 kg) IBW/kg (Calculated) : 84.5   Vital Signs: Temp: 99.9 F (37.7 C) (09/28 1200) Temp src: Oral (09/28 1200) BP: 127/52 mmHg (09/28 1200) Pulse Rate: 88  (09/28 1200) Intake/Output from previous day: 09/27 0701 - 09/28 0700 In: 2015 [I.V.:480; NG/GT:860; IV Piggyback:500] Out: 2590 [Urine:2590] Intake/Output from this shift: Total I/O In: 450 [I.V.:100; NG/GT:300; IV Piggyback:50] Out: 575 [Urine:575]  Labs:  [facility] 06/28/12 0445 06/27/12 0500  WBC 8.5 10.8*  HGB 8.2* 8.3*  PLT 233 231  LABCREA -- --  CREATININE 1.15 1.24   Estimated Creatinine Clearance: 88.5 ml/min (by C-G formula based on Cr of 1.15).  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  INR today remains therapeutic.  Lovenox completed.  No active bleeding noted. Noted slight decrease in hgb to 8.2 on 9/27, plt stable.  Noted patient has been on Levaquin since 9/23 (steno PNA) and fluconazole since 9/27 (empiric). Levaquin can potentiate INR to a lesser degree, and fluconazole majorly inhibits Coumadin metabolism, leading to abrupt INR increases, which are generally seen with initiation of fluconazole, requiring Coumadin dose decreases.  Goal of Therapy:  INR = 2-3  Plan:  1. Coumadin 3 mg PO x 1 tonight 2. Continue daily INR 3. Will need to watch PT/INR and CBC closely. 4. Please consider d/c Fluconazole  Thanks, Leinani Lisbon K. Allena Katz, PharmD, BCPS.  Clinical Pharmacist Pager [phone]. 06/29/2012 12:46 PM

## 2012-06-30 LAB — CBC WITH DIFFERENTIAL/PLATELET
Basophils Absolute: 0 10*3/uL (ref 0.0–0.1)
Basophils Relative: 0 % (ref 0–1)
Eosinophils Absolute: 0.2 10*3/uL (ref 0.0–0.7)
MCH: 28.4 pg (ref 26.0–34.0)
MCHC: 31.5 g/dL (ref 30.0–36.0)
Neutrophils Relative %: 78 % — ABNORMAL HIGH (ref 43–77)
Platelets: 209 10*3/uL (ref 150–400)

## 2012-06-30 LAB — GLUCOSE, CAPILLARY
Glucose-Capillary: 101 mg/dL — ABNORMAL HIGH (ref 70–99)
Glucose-Capillary: 104 mg/dL — ABNORMAL HIGH (ref 70–99)
Glucose-Capillary: 158 mg/dL — ABNORMAL HIGH (ref 70–99)

## 2012-06-30 LAB — PROTIME-INR: Prothrombin Time: 24.3 seconds — ABNORMAL HIGH (ref 11.6–15.2)

## 2012-06-30 LAB — BASIC METABOLIC PANEL
GFR calc Af Amer: 90 mL/min — ABNORMAL LOW (ref >90)
GFR calc non Af Amer: 78 mL/min — ABNORMAL LOW (ref >90)
Potassium: 3.5 mEq/L (ref 3.5–5.1)
Sodium: 142 mEq/L (ref 135–145)

## 2012-06-30 MED ORDER — WARFARIN SODIUM 3 MG PO TABS
3.0000 mg | ORAL_TABLET | Freq: Once | ORAL | Status: AC
  Administered 2012-06-30: 3 mg via ORAL
  Filled 2012-06-30: qty 1

## 2012-06-30 NOTE — Progress Notes (Signed)
ANTICOAGULATION CONSULT NOTE - Follow Up Consult  Pharmacy Consult for Coumadin Indication: RUE DVT  No Known Allergies  Patient Measurements: Height: 6\' 3"  (190.5 cm) Weight: 252 lb 10.4 oz (114.6 kg) IBW/kg (Calculated) : 84.5   Vital Signs: Temp: 100.8 F (38.2 C) (09/29 0800) Temp src: Axillary (09/29 0800) BP: 146/66 mmHg (09/29 1000) Pulse Rate: 99  (09/29 1000)  Labs:  [facility]ename 06/30/12 0600 06/29/12 0255 06/28/12 0445  HGB 8.1* -- 8.2*  HCT 25.7* -- 25.5*  PLT 209 -- 233  APTT -- -- --  LABPROT 24.3* 24.8* 23.1*  INR 2.30* 2.37* 2.15*  HEPARINUNFRC -- -- --  CREATININE 1.00 -- 1.15  CKTOTAL -- -- --  CKMB -- -- --  TROPONINI -- -- --    Estimated Creatinine Clearance: 101.9 ml/min (by C-G formula based on Cr of 1).   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 INR today remains therapeutic. Lovenox completed. No active bleeding noted. Noted slight decrease in hgb to 8.1, plt stable.  Noted patient has been on Levaquin since 9/23 (steno PNA) and fluconazole since 9/27 (empiric). Levaquin can potentiate INR to a lesser degree, and fluconazole majorly inhibits Coumadin metabolism, leading to abrupt INR increases, which are generally seen with initiation of fluconazole, requiring Coumadin dose decreases.  Goal of Therapy:  INR 2-3 Monitor platelets by anticoagulation protocol: Yes   Plan:  1. Repeat Coumadin 3 mg PO x 1 tonight  2. Continue daily INR  3. Will need to watch PT/INR and CBC closely.  4. Please consider d/c Fluconazole   Allena Katz, Zyion Doxtater K 06/30/2012,11:39 AM

## 2012-06-30 NOTE — Progress Notes (Signed)
Patient ID: Alex Foster, male   DOB: 07-Jun-1946, 66 y.o.   MRN: [id number] Follow up - Trauma and Critical Care  Patient Details:    Alex Foster is an 66 y.o. male..  Lines/tubes : PICC Triple Lumen 06/04/12 PICC Right Basilic (Active)  Indication for Insertion or Continuance of Line Prolonged intravenous therapies 06/27/2012  8:00 AM  Length mark (cm) 0 cm 06/04/2012  8:35 AM  Site Assessment Clean;Dry;Intact 06/28/2012  1:57 AM  Lumen #1 Status Infusing 06/28/2012  1:57 AM  Lumen #2 Status Flushed;Blood return noted 06/28/2012  1:57 AM  Lumen #3 Status Flushed;Blood return noted 06/28/2012  1:57 AM  Dressing Type Transparent 06/28/2012  1:57 AM  Dressing Status Clean;Dry;Antimicrobial disc in place 06/28/2012  1:57 AM  Line Care Connections checked and tightened 06/27/2012  8:00 PM  Dressing Intervention New dressing;Antimicrobial disc changed 06/26/2012  2:35 PM  Dressing Change Due 07/03/12 06/27/2012  8:00 PM     NG/OG Tube Nasogastric 16 Fr. Right nare (Active)  Placement Verification Auscultation 06/28/2012  6:00 AM     Rectal Tube/Pouch (Active)  Output (mL) 100 mL 06/25/2012 10:00 PM  Intake (mL) 30 mL 06/26/2012  8:00 PM     Urethral Catheter Straight-tip 14 Fr. (Active)  Indication for Insertion or Continuance of Catheter Urinary output monitoring 06/27/2012  8:00 PM  Site Assessment Clean;Intact 06/27/2012  8:00 PM  Collection Container Standard drainage bag 06/27/2012  8:00 PM  Securement Method Leg strap 06/27/2012  8:00 PM  Urinary Catheter Interventions Unclamped 06/27/2012  8:00 PM    Microbiology/Sepsis markers: Results for orders placed during the hospital encounter of 05/31/12  MRSA PCR SCREENING     Status: Abnormal   Collection Time   05/31/12  8:58 PM      Component Value Range Status Comment   MRSA by PCR INVALID RESULTS, SPECIMEN SENT FOR CULTURE (*) NEGATIVE Final   MRSA CULTURE     Status: Normal   Collection Time   05/31/12  8:58 PM      Component Value Range  Status Comment   Specimen Description NOSE   Final    Special Requests NONE   Final    Culture     Final    Value: NO STAPHYLOCOCCUS AUREUS ISOLATED     Note: NO MRSA ISOLATED   Report Status 06/03/2012 FINAL   Final   CULTURE, RESPIRATORY     Status: Normal   Collection Time   06/02/12  4:07 PM      Component Value Range Status Comment   Specimen Description BRONCHIAL ALVEOLAR LAVAGE   Final    Special Requests NONE   Final    Gram Stain     Final    Value: ABUNDANT WBC PRESENT, PREDOMINANTLY PMN     NO SQUAMOUS EPITHELIAL CELLS SEEN     MODERATE GRAM POSITIVE COCCI     IN PAIRS RARE GRAM NEGATIVE RODS   Culture     Final    Value: MODERATE HAEMOPHILUS PARAINFLUENZAE     Note: BETA LACTAMASE NEGATIVE   Report Status 06/05/2012 FINAL   Final   CLOSTRIDIUM DIFFICILE BY PCR     Status: Normal   Collection Time   06/13/12  8:05 AM      Component Value Range Status Comment   C difficile by pcr NEGATIVE  NEGATIVE Final   URINE CULTURE     Status: Normal   Collection Time   06/16/12  9:00 PM  Component Value Range Status Comment   Specimen Description URINE, CLEAN CATCH   Final    Special Requests NONE   Final    Culture  Setup Time 06/16/2012 21:37   Final    Colony Count NO GROWTH   Final    Culture NO GROWTH   Final    Report Status 06/18/2012 FINAL   Final   CULTURE, BLOOD (ROUTINE X 2)     Status: Normal   Collection Time   06/16/12  9:02 PM      Component Value Range Status Comment   Specimen Description BLOOD RIGHT FOOT   Final    Special Requests BOTTLES DRAWN AEROBIC ONLY 10CC   Final    Culture  Setup Time 06/17/2012 14:17   Final    Culture NO GROWTH 5 DAYS   Final    Report Status 06/23/2012 FINAL   Final   CULTURE, BLOOD (ROUTINE X 2)     Status: Normal   Collection Time   06/16/12  9:07 PM      Component Value Range Status Comment   Specimen Description BLOOD LEFT FOOT   Final    Special Requests BOTTLES DRAWN AEROBIC AND ANAEROBIC 10CC EACH   Final     Culture  Setup Time 06/17/2012 14:17   Final    Culture NO GROWTH 5 DAYS   Final    Report Status 06/23/2012 FINAL   Final   CULTURE, RESPIRATORY     Status: Normal   Collection Time   06/18/12 11:41 PM      Component Value Range Status Comment   Specimen Description TRACHEAL ASPIRATE   Final    Special Requests NONE   Final    Gram Stain     Final    Value: ABUNDANT WBC PRESENT, PREDOMINANTLY PMN     RARE SQUAMOUS EPITHELIAL CELLS PRESENT     FEW GRAM POSITIVE COCCI IN CLUSTERS     RARE GRAM POSITIVE RODS     RARE GRAM POSITIVE COCCI IN CHAINS   Culture Non-Pathogenic Oropharyngeal-type Flora Isolated.   Final    Report Status 06/21/2012 FINAL   Final   CULTURE, BLOOD (ROUTINE X 2)     Status: Normal   Collection Time   06/22/12  3:28 AM      Component Value Range Status Comment   Specimen Description BLOOD RIGHT ARM   Final    Special Requests BOTTLES DRAWN AEROBIC AND ANAEROBIC 10CC EACH   Final    Culture  Setup Time 06/22/2012 11:37   Final    Culture NO GROWTH 5 DAYS   Final    Report Status 06/28/2012 FINAL   Final   CULTURE, BLOOD (ROUTINE X 2)     Status: Normal   Collection Time   06/22/12  3:35 AM      Component Value Range Status Comment   Specimen Description BLOOD RIGHT HAND   Final    Special Requests BOTTLES DRAWN AEROBIC AND ANAEROBIC 10CC EACH   Final    Culture  Setup Time 06/22/2012 11:37   Final    Culture NO GROWTH 5 DAYS   Final    Report Status 06/28/2012 FINAL   Final   CULTURE, RESPIRATORY     Status: Normal   Collection Time   06/22/12  7:49 AM      Component Value Range Status Comment   Specimen Description TRACHEAL ASPIRATE   Final    Special Requests NONE   Final  Gram Stain     Final    Value: NO WBC SEEN     NO SQUAMOUS EPITHELIAL CELLS SEEN     RARE GRAM NEGATIVE RODS   Culture MODERATE STENOTROPHOMONAS MALTOPHILIA   Final    Report Status 06/25/2012 FINAL   Final    Organism ID, Bacteria STENOTROPHOMONAS MALTOPHILIA   Final   URINE  CULTURE     Status: Normal   Collection Time   06/22/12  5:12 PM      Component Value Range Status Comment   Specimen Description URINE, CATHETERIZED   Final    Special Requests NONE   Final    Culture  Setup Time 06/23/2012 11:39   Final    Colony Count NO GROWTH   Final    Culture NO GROWTH   Final    Report Status 06/24/2012 FINAL   Final     Anti-infectives:  Anti-infectives     Start     Dose/Rate Route Frequency Ordered Stop   06/24/12 1800   levofloxacin (LEVAQUIN) IVPB 750 mg        750 mg 100 mL/hr over 90 Minutes Intravenous Every 24 hours 06/24/12 1539 07/04/12 1759   06/23/12 1000   fluconazole (DIFLUCAN) IVPB 100 mg        100 mg 50 mL/hr over 60 Minutes Intravenous Every 24 hours 06/22/12 1632     06/23/12 0000   vancomycin (VANCOCIN) 1,500 mg in sodium chloride 0.9 % 500 mL IVPB  Status:  Discontinued        1,500 mg 250 mL/hr over 120 Minutes Intravenous Every 24 hours 06/22/12 1145 06/24/12 1539   06/22/12 1730   fluconazole (DIFLUCAN) IVPB 200 mg        200 mg 100 mL/hr over 60 Minutes Intravenous  Once 06/22/12 1630 06/22/12 1852   06/19/12 2200   vancomycin (VANCOCIN) IVPB 1000 mg/200 mL premix  Status:  Discontinued        1,000 mg 200 mL/hr over 60 Minutes Intravenous Every 12 hours 06/19/12 0940 06/22/12 1123   06/19/12 1000   vancomycin (VANCOCIN) 2,000 mg in sodium chloride 0.9 % 500 mL IVPB        2,000 mg 250 mL/hr over 120 Minutes Intravenous  Once 06/19/12 0940 06/19/12 1300   06/19/12 0930   ceFEPIme (MAXIPIME) 2 g in dextrose 5 % 50 mL IVPB  Status:  Discontinued        2 g 100 mL/hr over 30 Minutes Intravenous 3 times per day 06/19/12 0904 06/24/12 1539   06/04/12 1000   vancomycin (VANCOCIN) IVPB 1000 mg/200 mL premix  Status:  Discontinued        1,000 mg 200 mL/hr over 60 Minutes Intravenous Every 12 hours 06/04/12 0837 06/05/12 1045   06/03/12 1800   vancomycin (VANCOCIN) 1,500 mg in sodium chloride 0.9 % 500 mL IVPB  Status:   Discontinued        1,500 mg 250 mL/hr over 120 Minutes Intravenous Every 24 hours 06/02/12 1428 06/04/12 0837   06/02/12 1800   vancomycin (VANCOCIN) 2,000 mg in sodium chloride 0.9 % 500 mL IVPB        2,000 mg 250 mL/hr over 120 Minutes Intravenous  Once 06/02/12 1428 06/02/12 2024   06/02/12 1400   cefTAZidime (FORTAZ) 1 g in dextrose 5 % 50 mL IVPB        1 g 100 mL/hr over 30 Minutes Intravenous 3 times per day 06/02/12 1349 06/12/12 4098  Best Practice/Protocols:  VTE Prophylaxis: Mechanical GI Prophylaxis: Proton Pump Inhibitor Not on continuoous sedation.  Getting Klonopin and Seroquel  Consults: Treatment Team:  Melvenia Beam, MD    Events:  Subjective:    Overnight Issues: None, but still having secretions from trach, no other changes  Objective:  Vital signs for last 24 hours: Temp:  [99.3 F (37.4 C)-100.9 F (38.3 C)] 100.3 F (37.9 C) (09/29 0000) Pulse Rate:  [76-102] 86  (09/29 0700) Resp:  [17-34] 27  (09/29 0700) BP: (96-130)/(50-111) 114/56 mmHg (09/29 0700) SpO2:  [87 %-97 %] 93 % (09/29 0700) FiO2 (%):  [36.9 %-60 %] 46.7 % (09/29 0700) Weight:  [252 lb 10.4 oz (114.6 kg)] 252 lb 10.4 oz (114.6 kg) (09/29 0600)  Hemodynamic parameters for last 24 hours:    Intake/Output from previous day: 09/28 0701 - 09/29 0700 In: 1580 [I.V.:220; NG/GT:1160; IV Piggyback:200] Out: 2945 [Urine:2420; Stool:525]  Intake/Output this shift:    Vent settings for last 24 hours: Vent Mode:  [-] PRVC FiO2 (%):  [36.9 %-60 %] 46.7 % Set Rate:  [22 bmp] 22 bmp Vt Set:  [650 mL] 650 mL PEEP:  [5 cmH20] 5 cmH20 Plateau Pressure:  [17 cmH20-24 cmH20] 18 cmH20  Physical Exam:  General: resting in bed but moves to name, stills seems uncomfortable. Neuro: confused, agitated and flaccid in the left arm HEENT/Neck: trach-clean, intact and increased secretions around trach GI: soft, nontender, BS WNL, no r/g and NGT in place, rectal tube in  place Extremities: edema 2+ and Improved edema  Results for orders placed during the hospital encounter of 05/31/12 (from the past 24 hour(s))  GLUCOSE, CAPILLARY     Status: Abnormal   Collection Time   06/29/12 12:27 PM      Component Value Range   Glucose-Capillary 146 (*) 70 - 99 mg/dL  GLUCOSE, CAPILLARY     Status: Abnormal   Collection Time   06/29/12  3:28 PM      Component Value Range   Glucose-Capillary 123 (*) 70 - 99 mg/dL  GLUCOSE, CAPILLARY     Status: Abnormal   Collection Time   06/29/12  7:31 PM      Component Value Range   Glucose-Capillary 145 (*) 70 - 99 mg/dL  GLUCOSE, CAPILLARY     Status: Abnormal   Collection Time   06/29/12 11:55 PM      Component Value Range   Glucose-Capillary 104 (*) 70 - 99 mg/dL  PROTIME-INR     Status: Abnormal   Collection Time   06/30/12  6:00 AM      Component Value Range   Prothrombin Time 24.3 (*) 11.6 - 15.2 seconds   INR 2.30 (*) 0.00 - 1.49  CBC WITH DIFFERENTIAL     Status: Abnormal   Collection Time   06/30/12  6:00 AM      Component Value Range   WBC 7.2  4.0 - 10.5 K/uL   RBC 2.85 (*) 4.22 - 5.81 MIL/uL   Hemoglobin 8.1 (*) 13.0 - 17.0 g/dL   HCT 82.9 (*) 56.2 - 13.0 %   MCV 90.2  78.0 - 100.0 fL   MCH 28.4  26.0 - 34.0 pg   MCHC 31.5  30.0 - 36.0 g/dL   RDW 86.5 (*) 78.4 - 69.6 %   Platelets 209  150 - 400 K/uL   Neutrophils Relative 78 (*) 43 - 77 %   Neutro Abs 5.6  1.7 - 7.7 K/uL   Lymphocytes  Relative 12  12 - 46 %   Lymphs Abs 0.9  0.7 - 4.0 K/uL   Monocytes Relative 8  3 - 12 %   Monocytes Absolute 0.6  0.1 - 1.0 K/uL   Eosinophils Relative 2  0 - 5 %   Eosinophils Absolute 0.2  0.0 - 0.7 K/uL   Basophils Relative 0  0 - 1 %   Basophils Absolute 0.0  0.0 - 0.1 K/uL  BASIC METABOLIC PANEL     Status: Abnormal   Collection Time   06/30/12  6:00 AM      Component Value Range   Sodium 142  135 - 145 mEq/L   Potassium 3.5  3.5 - 5.1 mEq/L   Chloride 107  96 - 112 mEq/L   CO2 28  19 - 32 mEq/L    Glucose, Bld 157 (*) 70 - 99 mg/dL   BUN 41 (*) 6 - 23 mg/dL   Creatinine, Ser 1.61  0.50 - 1.35 mg/dL   Calcium 8.0 (*) 8.4 - 10.5 mg/dL   GFR calc non Af Amer 78 (*) >90 mL/min   GFR calc Af Amer 90 (*) >90 mL/min     Assessment/Plan:   NEURO  Altered Mental Status:  agitation   Plan: Continue some sedation  PULM  Pneumonia: hospital acquired (not ventilator-associated) Stenotrophomonas   Plan: CPM with antibiotics, trach changed yesterday, cxr pending this am.  CARDIO  Atrial Premature Beats (without hemodynamic compromise) and Sinus Tachycardia   Plan: No specific treatment  RENAL  BUN and creat improving.   Plan: Maintain volume status  GI  No problems.   Probably G-tube in the future   Plan: Continue tube feedings after trach change  ID  Pneumonia (hospital acquired (not ventilator-associated) Steno)   Plan: Continue antibiotics  HEME  Anemia anemia of chronic disease and anemia of critical illness)   Plan: Continue to follow every other day.  ENDO Glucose maintained between 135-179   Plan: CPM  Global Issues  The patient is not weaning as well as we would like.  Part of this is because of agitation, part is because of pulmonary disease.  Will possibly need G-tube in the future.    LOS: 30 days   Additional comments:I reviewed the patient's new clinical lab test results. cbc/bmet  Critical Care Total Time*: 30 Minutes  Alex Foster 06/30/2012  *Care during the described time interval was provided by me and/or other providers on the critical care team.  I have reviewed this patient's available data, including medical history, events of note, physical examination and test results as part of my evaluation.

## 2012-06-30 NOTE — Progress Notes (Signed)
Will try to get aggressively onto trach collar today.  Will need PEG next week.  This patient has been seen and I agree with the findings and treatment plan.  Marta Lamas. Gae Bon, MD, FACS [phone] (pager) [phone] (direct pager) Trauma Surgeon

## 2012-06-30 NOTE — Progress Notes (Signed)
Trach collar trial initiated at 0915 on 60%.  Pt placed back on vent on PSV 10/5 due to increased WOB, RR 37, copious secretions, Sats mid 80's.  RT will continue to monitor

## 2012-07-01 LAB — GLUCOSE, CAPILLARY
Glucose-Capillary: 126 mg/dL — ABNORMAL HIGH (ref 70–99)
Glucose-Capillary: 139 mg/dL — ABNORMAL HIGH (ref 70–99)
Glucose-Capillary: 91 mg/dL (ref 70–99)

## 2012-07-01 MED ORDER — WARFARIN SODIUM 3 MG PO TABS
3.0000 mg | ORAL_TABLET | Freq: Once | ORAL | Status: DC
  Filled 2012-07-01: qty 1

## 2012-07-01 MED ORDER — PRO-STAT SUGAR FREE PO LIQD
60.0000 mL | Freq: Three times a day (TID) | ORAL | Status: DC
  Administered 2012-07-01 – 2012-07-19 (×54): 60 mL
  Filled 2012-07-01 (×59): qty 60

## 2012-07-01 MED ORDER — ENOXAPARIN SODIUM 120 MG/0.8ML ~~LOC~~ SOLN: 1.0000 mg/kg | Freq: Two times a day (BID) | SUBCUTANEOUS | Status: DC

## 2012-07-01 MED ORDER — PIVOT 1.5 CAL PO LIQD
1000.0000 mL | ORAL | Status: DC
  Administered 2012-07-02: 1000 mL
  Filled 2012-07-01 (×5): qty 1000
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
## 2012-07-01 NOTE — Clinical Social Work Note (Signed)
Clinical Social Worker continuing to follow patient for emotional support and discharge planning needs.  CSW spoke with patient brother over the phone to further discuss patient options at discharge.  Patient brother understands that patient is not weaning well from the ventilator and may require vent SNF placement.  CSW spoke at length with patient brother about the liklihood of patient placement out of town.  Patient brother understands and is agreeable with initial fax of information.  CSW has initiated search and will follow up with patient brother and facilities once patient is able to have PEG placed.  Clinical Social Worker will continue to follow for support and discharge planning needs.  Macario Golds, [place] [phone]
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
## 2012-07-01 NOTE — Progress Notes (Signed)
Nutrition Follow-up  Intervention:  1. Will increase TF of of Pivot 1.5 @ 20 ml/hr by 10 ml every 4 hours to a goal rate of 40 ml/hr. Plus prostat (60 ml) TID. Total enteral nutrition to provide 2040 kcal, 180 grams protein, and 729 ml free water. (TF at goal meets 95% of estimated energy needs and 107% of estimated protein needs).  2. Once IVF has been discontinued, recommend add free water flushes to meet fluid needs.  3. RD to follow for nutrition plan of care.   Assessment:   Patient now off mechanical ventilation. NGT in place. Noted patient with wounds. Patient currently receiving Pivot 1.5 @ 20 ml/hr plus prostat (60 ml) 5 times daily, providing 1720 kcal and 195 grams protein. Per RN, patient tolerating TF well and plans for PEG placement when able.   Diet Order:  NPO, TF Pivot 1.5 @ 20 ml/hr plus prostat 5 a day.   Meds: Scheduled Meds:   . antiseptic oral rinse  15 mL Mouth Rinse Q2H  . bacitracin   Topical BID  . chlorhexidine  15 mL Mouth/Throat BID  . clonazePAM  2 mg Per Tube BID  . collagenase   Topical Daily  . feeding supplement (PIVOT 1.5 CAL)  1,000 mL Per Tube Q24H  . feeding supplement  60 mL Per Tube 5 X Daily  . fentaNYL  50 mcg Transdermal Q72H  . free water  200 mL Per Tube Q6H  . insulin aspart  0-15 Units Subcutaneous Q4H  . insulin glargine  45 Units Subcutaneous Daily  . levofloxacin (LEVAQUIN) IV  750 mg Intravenous Q24H  . multivitamin  5 mL Per Tube Daily  . pantoprazole sodium  40 mg Per Tube Q1200  . QUEtiapine  100 mg Per Tube Q8H  . sodium chloride  10-40 mL Intracatheter Q12H  . warfarin  3 mg Oral ONCE-1800  . Warfarin - Pharmacist Dosing Inpatient   Does not apply q1800  . DISCONTD: fluconazole (DIFLUCAN) IV  100 mg Intravenous Q24H   Continuous Infusions:   . dextrose 5 % with kcl 20 mL/hr at 07/01/12 0800  . fentaNYL infusion INTRAVENOUS Stopped (06/26/12 2200)  . midazolam (VERSED) infusion Stopped (06/26/12 0750)  . phenylephrine  (NEO-SYNEPHRINE) Adult infusion Stopped (06/27/12 1145)   PRN Meds:.acetaminophen (TYLENOL) oral liquid 160 mg/5 mL, albuterol, bisacodyl, fentaNYL, fentaNYL, ipratropium, midazolam, ondansetron (ZOFRAN) IV, sodium chloride  Labs:  CMP     Component Value Date/Time   NA 142 06/30/2012 0600   K 3.5 06/30/2012 0600   CL 107 06/30/2012 0600   CO2 28 06/30/2012 0600   GLUCOSE 157* 06/30/2012 0600   BUN 41* 06/30/2012 0600   CREATININE 1.00 06/30/2012 0600   CALCIUM 8.0* 06/30/2012 0600   PROT 4.9* 06/04/2012 0420   ALBUMIN 2.3* 06/04/2012 0420   AST 22 06/04/2012 0420   ALT 15 06/04/2012 0420   ALKPHOS 90 06/04/2012 0420   BILITOT 0.6 06/04/2012 0420   GFRNONAA 78* 06/30/2012 0600   GFRAA 90* 06/30/2012 0600     Intake/Output Summary (Last 24 hours) at 07/01/12 0937 Last data filed at 07/01/12 0800  Gross per 24 hour  Intake   2210 ml  Output   2025 ml  Net    185 ml   Wt Readings from Last 10 Encounters:  07/01/12 247 lb 5.7 oz (112.2 kg)  07/01/12 247 lb 5.7 oz (112.2 kg)     Weight Status:  247 lb. Weight has been fluctuating during admission 112.7-119.3  kg.   Re-estimated needs:  2150-2450 kcal, 134-168 grams protein  Nutrition Dx: Inadequate Oral Intake, Ongoing.   Goal:  1. Enteral nutrition to provide 60-70% of estimated calorie needs (22-25 kcals/kg ideal body weight) and >/= 90% of estimated protein needs, based on ASPEN guidelines for permissive underfeeding in critically ill obese individuals. - Discontinue, pt now off mechanical ventilation.  NEW GOALS: 2. Positive tolerance of TF.  3. Meet > 90% of estimated energy needs with nutrition support.    Monitor:  TF tolerance, weight, labs, PEG placement, skin   Adron Bene [phone]
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
## 2012-07-01 NOTE — Progress Notes (Signed)
UR completed 

## 2012-07-01 NOTE — Progress Notes (Signed)
Occupational Therapy Treatment Patient Details Name: Alex Foster MRN: [id number] DOB: 21-Jan-1946 Today's Date: 07/01/2012 Time: 4098-1191 OT Time Calculation (min): 31 min  OT Assessment / Plan / Recommendation Comments on Treatment Session Pt demonstrating improvement in attention, ability to follow commands, attempts to communicate by mouthing words and use of gestures.  Emerging activation of L UE.  Sensation in L UE appears impaired.  Following objects across midline with combination of eye/head movments.  [place] level V  Follow Up Recommendations  Inpatient Rehab    Barriers to Discharge       Equipment Recommendations       Recommendations for Other Services Rehab consult  Frequency Min 2X/week   Plan Discharge plan remains appropriate    Precautions / Restrictions Precautions Precaution Comments: LT UE risk for subluxation due to no AROM noted   Pertinent Vitals/Pain     ADL  Grooming: Performed;+1 Total assistance;Wash/dry face Where Assessed - Grooming: Supine, head of bed up Transfers/Ambulation Related to ADLs: not appropriate at this time ADL Comments: Pt alert throughout session with sustained attention.  Following commands such as "raise your arm up," "wash your face."  Delay in following directions varied from 10-30 seconds.  Pt able to mouth words to state number on the calendar.  Able to report pain in abdomen by touching stomach.  Responding inconsistently for yes/no.     OT Diagnosis:    OT Problem List:   OT Treatment Interventions:     OT Goals Acute Rehab OT Goals Time For Goal Achievement: 07/17/12 Potential to Achieve Goals: Good ADL Goals Pt Will Perform Grooming: with mod assist;Supine, head of bed up;Sitting, edge of bed;Supported;Other (comment) ADL Goal: Grooming - Progress: Progressing toward goals Miscellaneous OT Goals Miscellaneous OT Goal #1: Pt will follow 2 step command 2 out 3 attempts during session  OT Goal: Miscellaneous Goal #1  - Progress: Progressing toward goals Miscellaneous OT Goal #2: Pt will initiate 2 adl task with presentation of adl item and 1 simple verbal cue OT Goal: Miscellaneous Goal #2 - Progress: Not met Miscellaneous OT Goal #3: Pt will tolerate eob sitting for 5 minutes as precursor to adls OT Goal: Miscellaneous Goal #3 - Progress: Not met  Visit Information  Last OT Received On: 07/01/12 Assistance Needed: +2 PT/OT Co-Evaluation/Treatment: Yes    Subjective Data      Prior Functioning       Cognition  Overall Cognitive Status: Impaired Area of Impairment: Attention;Following commands Arousal/Alertness: Awake/alert Current Attention Level: Sustained Following Commands: Follows one step commands with increased time Cognition - Other Comments: 80-90% of time following one step simple commands with extra time.    Mobility  Shoulder Instructions Bed Mobility Bed Mobility: Not assessed       Exercises  Low Level/ICU Exercises Shoulder Flexion: AROM;5 reps;Right;AAROM;Left;Supine Elbow Flexion: AAROM;Left;5 reps   Balance     End of Session OT - End of Session Activity Tolerance: Patient tolerated treatment well Patient left: in bed;with call bell/phone within reach Nurse Communication: Other (comment) (pt with stomach pain)  GO     Evern Bio 07/01/2012, 1:29 PM [phone]

## 2012-07-01 NOTE — Clinical Social Work Placement (Addendum)
    Clinical Social Work Department CLINICAL SOCIAL WORK PLACEMENT NOTE 07/01/2012  Patient:  Alex Foster, Alex Foster  Account Number:  [id number] Admit date:  05/31/2012  Clinical Social Worker:  Macario Golds, Theresia Majors  Date/time:  07/01/2012 04:30 PM  Clinical Social Work is seeking post-discharge placement for this patient at the following level of care:   SKILLED NURSING   (*CSW will update this form in Epic as items are completed)   07/01/2012  Patient/family provided with [facility] Department of Clinical Social Work's list of facilities offering this level of care within the geographic area requested by the patient (or if unable, by the patient's family).  07/01/2012  Patient/family informed of their freedom to choose among providers that offer the needed level of care, that participate in Medicare, Medicaid or managed care program needed by the patient, have an available bed and are willing to accept the patient.  07/01/2012  Patient/family informed of [facility]' ownership interest in [facility], as well as of the fact that they are under no obligation to receive care at this facility.  PASARR submitted to EDS on 06/14/2012 PASARR number received from EDS on 06/14/2012  FL2 transmitted to all facilities in geographic area requested by pt/family on  07/01/2012 FL2 transmitted to all facilities within larger geographic area on   Patient informed that his/her managed care company has contracts with or will negotiate with  certain facilities, including the following:     Patient/family informed of bed offers received: 07/03/2012  Patient chooses bed at  [facility] Physician recommends and patient chooses bed at    Patient to be transferred to  on   Patient to be transferred to facility by   The following physician request were entered in Epic:   Additional Comments: 09/30 At this time only able to pursue vent SNF due to patient inability to wean for long  periods of time. Patient family understanding of a long distance placement

## 2012-07-02 LAB — BASIC METABOLIC PANEL
BUN: 31 mg/dL — ABNORMAL HIGH (ref 6–23)
Calcium: 7.7 mg/dL — ABNORMAL LOW (ref 8.4–10.5)
Creatinine, Ser: 0.83 mg/dL (ref 0.50–1.35)
GFR calc non Af Amer: >90 mL/min (ref >90)
Glucose, Bld: 340 mg/dL — ABNORMAL HIGH (ref 70–99)

## 2012-07-02 LAB — CBC
HCT: 25.2 % — ABNORMAL LOW (ref 39.0–52.0)
Hemoglobin: 8 g/dL — ABNORMAL LOW (ref 13.0–17.0)
MCH: 28.9 pg (ref 26.0–34.0)
MCHC: 31.7 g/dL (ref 30.0–36.0)
MCV: 91 fL (ref 78.0–100.0)
RDW: 16.1 % — ABNORMAL HIGH (ref 11.5–15.5)

## 2012-07-02 LAB — GLUCOSE, CAPILLARY
Glucose-Capillary: 122 mg/dL — ABNORMAL HIGH (ref 70–99)
Glucose-Capillary: 133 mg/dL — ABNORMAL HIGH (ref 70–99)
Glucose-Capillary: 93 mg/dL (ref 70–99)
Glucose-Capillary: 135 mg/dL — ABNORMAL HIGH (ref 70–99)

## 2012-07-02 MED ORDER — TAMSULOSIN HCL 0.4 MG PO CAPS
0.4000 mg | ORAL_CAPSULE | Freq: Every day | ORAL | Status: DC
  Administered 2012-07-02 – 2012-07-03 (×2): 0.4 mg via ORAL
  Filled 2012-07-02 (×3): qty 1

## 2012-07-02 MED ORDER — BETHANECHOL CHLORIDE 25 MG PO TABS
25.0000 mg | ORAL_TABLET | Freq: Four times a day (QID) | ORAL | Status: DC
  Administered 2012-07-02 – 2012-07-03 (×8): 25 mg
  Filled 2012-07-02 (×12): qty 1

## 2012-07-02 NOTE — Progress Notes (Signed)
Pt transferred from 3100 at approximately 1705 with NS IVF infusing, trach vent 50% FIO2.  Pt was alert and able to follow simple commands.

## 2012-07-02 NOTE — Clinical Social Work Note (Signed)
Clinical Social Worker spoke with patient brothers over the phone to complete [place] [employer].  CSW completed application with as much information as patient brother was able to provide.  CSW spoke with [facility] who is still reviewing patient medical information and vent settings for possible bed offer.  Patient was able to wean on trach collar for 2 hours today - patient family is hopeful that he will be able to wean and not have to go to [place] but are agreeable if patient remains on ventilator.  Clinical Social Worker remains available for emotional support and discharge planning needs.  Macario Golds, [place] [phone]

## 2012-07-02 NOTE — Progress Notes (Signed)
Patient ID: Alex Foster, male   DOB: [birth year], 66 y.o.   MRN: [id number] Follow up - Trauma Critical Care  Patient Details:    Alex Foster is an 66 y.o. male.  Lines/tubes : PICC Triple Lumen 06/04/12 PICC Right Basilic (Active)  Indication for Insertion or Continuance of Line Vasoactive infusions;Chronic illness with exacerbations (CF, Sickle Cell, etc.);Head or chest injuries (Tracheotomy, burns, open chest wounds);Prolonged intravenous therapies;Limited venous access - need for IV therapy >5 days (PICC only);Poor Vasculature-patient has had multiple peripheral attempts or PIVs lasting less than 24 hours 07/01/2012  8:00 PM  Length mark (cm) 0 cm 06/04/2012  8:35 AM  Site Assessment Clean;Dry;Intact 07/01/2012  8:00 PM  Lumen #1 Status Infusing 07/01/2012  8:00 PM  Lumen #2 Status Capped (Central line) 07/01/2012  8:00 PM  Lumen #3 Status Capped (Central line) 07/01/2012  8:00 PM  Dressing Type Transparent;Occlusive 07/01/2012  8:00 PM  Dressing Status Clean;Dry;Intact;Antimicrobial disc in place 07/01/2012  8:00 PM  Line Care Connections checked and tightened 07/01/2012  8:00 PM  Dressing Intervention New dressing;Antimicrobial disc changed 06/26/2012  2:35 PM  Dressing Change Due 07/03/12 07/01/2012  8:00 PM     NG/OG Tube Nasogastric 16 Fr. Right nare (Active)  Placement Verification Auscultation 07/01/2012  8:00 PM  Site Assessment Clean;Dry;Intact 07/01/2012  8:00 PM  Status Infusing tube feed 07/01/2012  8:00 PM  Gastric Residual 5 mL 06/30/2012  8:00 PM  Intake (mL) 40 mL 07/02/2012  8:00 AM     Rectal Tube/Pouch (Active)  Output (mL) 125 mL 06/29/2012  6:00 PM  Intake (mL) 175 mL 06/29/2012  6:00 AM     Urethral Catheter Straight-tip 14 Fr. (Active)  Indication for Insertion or Continuance of Catheter Acute urinary retention 07/01/2012  8:00 PM  Site Assessment Clean;Intact 07/01/2012  8:00 PM  Collection Container Standard drainage bag 07/01/2012  8:00 PM  Securement Method Leg strap  07/01/2012  8:00 PM  Urinary Catheter Interventions Unclamped 07/01/2012  8:00 PM  Output (mL) 250 mL 07/01/2012  8:00 PM    Microbiology/Sepsis markers: Results for orders placed during the hospital encounter of 05/31/12  MRSA PCR SCREENING     Status: Abnormal   Collection Time   05/31/12  8:58 PM      Component Value Range Status Comment   MRSA by PCR INVALID RESULTS, SPECIMEN SENT FOR CULTURE (*) NEGATIVE Final   MRSA CULTURE     Status: Normal   Collection Time   05/31/12  8:58 PM      Component Value Range Status Comment   Specimen Description NOSE   Final    Special Requests NONE   Final    Culture     Final    Value: NO STAPHYLOCOCCUS AUREUS ISOLATED     Note: NO MRSA ISOLATED   Report Status 06/03/2012 FINAL   Final   CULTURE, RESPIRATORY     Status: Normal   Collection Time   06/02/12  4:07 PM      Component Value Range Status Comment   Specimen Description BRONCHIAL ALVEOLAR LAVAGE   Final    Special Requests NONE   Final    Gram Stain     Final    Value: ABUNDANT WBC PRESENT, PREDOMINANTLY PMN     NO SQUAMOUS EPITHELIAL CELLS SEEN     MODERATE GRAM POSITIVE COCCI     IN PAIRS RARE GRAM NEGATIVE RODS   Culture     Final    Value: MODERATE HAEMOPHILUS PARAINFLUENZAE  Note: BETA LACTAMASE NEGATIVE   Report Status 06/05/2012 FINAL   Final   CLOSTRIDIUM DIFFICILE BY PCR     Status: Normal   Collection Time   06/13/12  8:05 AM      Component Value Range Status Comment   C difficile by pcr NEGATIVE  NEGATIVE Final   URINE CULTURE     Status: Normal   Collection Time   06/16/12  9:00 PM      Component Value Range Status Comment   Specimen Description URINE, CLEAN CATCH   Final    Special Requests NONE   Final    Culture  Setup Time 06/16/2012 21:37   Final    Colony Count NO GROWTH   Final    Culture NO GROWTH   Final    Report Status 06/18/2012 FINAL   Final   CULTURE, BLOOD (ROUTINE X 2)     Status: Normal   Collection Time   06/16/12  9:02 PM      Component  Value Range Status Comment   Specimen Description BLOOD RIGHT FOOT   Final    Special Requests BOTTLES DRAWN AEROBIC ONLY 10CC   Final    Culture  Setup Time 06/17/2012 14:17   Final    Culture NO GROWTH 5 DAYS   Final    Report Status 06/23/2012 FINAL   Final   CULTURE, BLOOD (ROUTINE X 2)     Status: Normal   Collection Time   06/16/12  9:07 PM      Component Value Range Status Comment   Specimen Description BLOOD LEFT FOOT   Final    Special Requests BOTTLES DRAWN AEROBIC AND ANAEROBIC 10CC EACH   Final    Culture  Setup Time 06/17/2012 14:17   Final    Culture NO GROWTH 5 DAYS   Final    Report Status 06/23/2012 FINAL   Final   CULTURE, RESPIRATORY     Status: Normal   Collection Time   06/18/12 11:41 PM      Component Value Range Status Comment   Specimen Description TRACHEAL ASPIRATE   Final    Special Requests NONE   Final    Gram Stain     Final    Value: ABUNDANT WBC PRESENT, PREDOMINANTLY PMN     RARE SQUAMOUS EPITHELIAL CELLS PRESENT     FEW GRAM POSITIVE COCCI IN CLUSTERS     RARE GRAM POSITIVE RODS     RARE GRAM POSITIVE COCCI IN CHAINS   Culture Non-Pathogenic Oropharyngeal-type Flora Isolated.   Final    Report Status 06/21/2012 FINAL   Final   CULTURE, BLOOD (ROUTINE X 2)     Status: Normal   Collection Time   06/22/12  3:28 AM      Component Value Range Status Comment   Specimen Description BLOOD RIGHT ARM   Final    Special Requests BOTTLES DRAWN AEROBIC AND ANAEROBIC 10CC EACH   Final    Culture  Setup Time 06/22/2012 11:37   Final    Culture NO GROWTH 5 DAYS   Final    Report Status 06/28/2012 FINAL   Final   CULTURE, BLOOD (ROUTINE X 2)     Status: Normal   Collection Time   06/22/12  3:35 AM      Component Value Range Status Comment   Specimen Description BLOOD RIGHT HAND   Final    Special Requests BOTTLES DRAWN AEROBIC AND ANAEROBIC 10CC EACH   Final  Culture  Setup Time 06/22/2012 11:37   Final    Culture NO GROWTH 5 DAYS   Final    Report Status  06/28/2012 FINAL   Final   CULTURE, RESPIRATORY     Status: Normal   Collection Time   06/22/12  7:49 AM      Component Value Range Status Comment   Specimen Description TRACHEAL ASPIRATE   Final    Special Requests NONE   Final    Gram Stain     Final    Value: NO WBC SEEN     NO SQUAMOUS EPITHELIAL CELLS SEEN     RARE GRAM NEGATIVE RODS   Culture MODERATE STENOTROPHOMONAS MALTOPHILIA   Final    Report Status 06/25/2012 FINAL   Final    Organism ID, Bacteria STENOTROPHOMONAS MALTOPHILIA   Final   URINE CULTURE     Status: Normal   Collection Time   06/22/12  5:12 PM      Component Value Range Status Comment   Specimen Description URINE, CATHETERIZED   Final    Special Requests NONE   Final    Culture  Setup Time 06/23/2012 11:39   Final    Colony Count NO GROWTH   Final    Culture NO GROWTH   Final    Report Status 06/24/2012 FINAL   Final     Anti-infectives:  Anti-infectives     Start     Dose/Rate Route Frequency Ordered Stop   06/24/12 1800   levofloxacin (LEVAQUIN) IVPB 750 mg        750 mg 100 mL/hr over 90 Minutes Intravenous Every 24 hours 06/24/12 1539 07/08/12 1759   06/23/12 1000   fluconazole (DIFLUCAN) IVPB 100 mg  Status:  Discontinued        100 mg 50 mL/hr over 60 Minutes Intravenous Every 24 hours 06/22/12 1632 07/01/12 0809   06/23/12 0000   vancomycin (VANCOCIN) 1,500 mg in sodium chloride 0.9 % 500 mL IVPB  Status:  Discontinued        1,500 mg 250 mL/hr over 120 Minutes Intravenous Every 24 hours 06/22/12 1145 06/24/12 1539   06/22/12 1730   fluconazole (DIFLUCAN) IVPB 200 mg        200 mg 100 mL/hr over 60 Minutes Intravenous  Once 06/22/12 1630 06/22/12 1852   06/19/12 2200   vancomycin (VANCOCIN) IVPB 1000 mg/200 mL premix  Status:  Discontinued        1,000 mg 200 mL/hr over 60 Minutes Intravenous Every 12 hours 06/19/12 0940 06/22/12 1123   06/19/12 1000   vancomycin (VANCOCIN) 2,000 mg in sodium chloride 0.9 % 500 mL IVPB        2,000  mg 250 mL/hr over 120 Minutes Intravenous  Once 06/19/12 0940 06/19/12 1300   06/19/12 0930   ceFEPIme (MAXIPIME) 2 g in dextrose 5 % 50 mL IVPB  Status:  Discontinued        2 g 100 mL/hr over 30 Minutes Intravenous 3 times per day 06/19/12 0904 06/24/12 1539   06/04/12 1000   vancomycin (VANCOCIN) IVPB 1000 mg/200 mL premix  Status:  Discontinued        1,000 mg 200 mL/hr over 60 Minutes Intravenous Every 12 hours 06/04/12 0837 06/05/12 1045   06/03/12 1800   vancomycin (VANCOCIN) 1,500 mg in sodium chloride 0.9 % 500 mL IVPB  Status:  Discontinued        1,500 mg 250 mL/hr over 120 Minutes Intravenous Every 24  hours 06/02/12 1428 06/04/12 0837   06/02/12 1800   vancomycin (VANCOCIN) 2,000 mg in sodium chloride 0.9 % 500 mL IVPB        2,000 mg 250 mL/hr over 120 Minutes Intravenous  Once 06/02/12 1428 06/02/12 2024   06/02/12 1400   cefTAZidime (FORTAZ) 1 g in dextrose 5 % 50 mL IVPB        1 g 100 mL/hr over 30 Minutes Intravenous 3 times per day 06/02/12 1349 06/12/12 1610          Best Practice/Protocols:  Coumadin for VTE  Consults: Treatment Team:  Melvenia Beam, MD    Studies: Dg Chest Port 1 View  06/29/2012  *RADIOLOGY REPORT*  Clinical Data: Pneumonia.  PORTABLE CHEST - 1 VIEW  Comparison: Chest x-ray 06/26/2012.  Findings: A tracheostomy tube is in place with tip 8.6 cm above the carina. There is a right upper extremity PICC with tip terminating in the mid superior vena cava. A nasogastric tube is seen extending into the stomach, however, the tip of the nasogastric tube extends below the lower margin of the image.  Lung volumes are slightly low.  There are extensive bibasilar opacities which may represent areas of atelectasis and/or consolidation with superimposed moderate bilateral pleural effusions.  Mild pulmonary venous engorgement without frank pulmonary edema.  Mild cardiomegaly is similar to priors. The patient is rotated to the left on today's exam, resulting  in distortion of the mediastinal contours and reduced diagnostic sensitivity and specificity for mediastinal pathology.  Atherosclerosis of the thoracic aorta. Multiple subacute and old right-sided rib fractures are redemonstrated.  IMPRESSION: 1.  Allowing for slight differences in patient positioning, the radiographic appearance of the chest is essentially unchanged, as above.   Original Report Authenticated By: Florencia Reasons, M.D.    Events:  Subjective:    Overnight Issues: no changes  Objective:  Vital signs for last 24 hours: Temp:  [98.2 F (36.8 C)-99.2 F (37.3 C)] 98.2 F (36.8 C) (10/01 0700) Pulse Rate:  [74-108] 87  (10/01 0800) Resp:  [18-41] 18  (10/01 0800) BP: (96-155)/(47-74) 134/65 mmHg (10/01 0800) SpO2:  [91 %-100 %] 95 % (10/01 0800) FiO2 (%):  [36.8 %-50 %] 37 % (10/01 0800) Weight:  [111.2 kg (245 lb 2.4 oz)] 111.2 kg (245 lb 2.4 oz) (10/01 0500)  Hemodynamic parameters for last 24 hours:    Intake/Output from previous day: 09/30 0701 - 10/01 0700 In: 1730 [P.O.:200; I.V.:480; NG/GT:900; IV Piggyback:150] Out: 2480 [Urine:2480]  Intake/Output this shift: Total I/O In: 60 [I.V.:20; NG/GT:40] Out: -   Vent settings for last 24 hours: Vent Mode:  [-] PRVC FiO2 (%):  [36.8 %-50 %] 37 % Set Rate:  [22 bmp] 22 bmp Vt Set:  [650 mL] 650 mL PEEP:  [5 cmH20] 5 cmH20 Plateau Pressure:  [16 cmH20-18 cmH20] 16 cmH20  Physical Exam:  General: on vent HEENT/Neck: trach-clean, intact Resp: clear to auscultation bilaterally CVS: RRR GI: soft, NT, UH reduces, +BS Extremities: no edema Neuro: F/C with R side, also moved L toes to command (first time on my exam).  Results for orders placed during the hospital encounter of 05/31/12 (from the past 24 hour(s))  GLUCOSE, CAPILLARY     Status: Abnormal   Collection Time   07/01/12 12:21 PM      Component Value Range   Glucose-Capillary 139 (*) 70 - 99 mg/dL  GLUCOSE, CAPILLARY     Status: Normal    Collection Time   07/01/12  4:10 PM      Component Value Range   Glucose-Capillary 91  70 - 99 mg/dL   Comment 1 Notify RN     Comment 2 Documented in Chart    GLUCOSE, CAPILLARY     Status: Abnormal   Collection Time   07/01/12  7:42 PM      Component Value Range   Glucose-Capillary 104 (*) 70 - 99 mg/dL   Comment 1 Notify RN     Comment 2 Documented in Chart    GLUCOSE, CAPILLARY     Status: Normal   Collection Time   07/02/12 12:05 AM      Component Value Range   Glucose-Capillary 88  70 - 99 mg/dL  GLUCOSE, CAPILLARY     Status: Abnormal   Collection Time   07/02/12  4:24 AM      Component Value Range   Glucose-Capillary 122 (*) 70 - 99 mg/dL  PROTIME-INR     Status: Abnormal   Collection Time   07/02/12  4:40 AM      Component Value Range   Prothrombin Time 24.6 (*) 11.6 - 15.2 seconds   INR 2.34 (*) 0.00 - 1.49  CBC     Status: Abnormal   Collection Time   07/02/12  4:40 AM      Component Value Range   WBC 6.0  4.0 - 10.5 K/uL   RBC 2.77 (*) 4.22 - 5.81 MIL/uL   Hemoglobin 8.0 (*) 13.0 - 17.0 g/dL   HCT 14.7 (*) 82.9 - 56.2 %   MCV 91.0  78.0 - 100.0 fL   MCH 28.9  26.0 - 34.0 pg   MCHC 31.7  30.0 - 36.0 g/dL   RDW 13.0 (*) 86.5 - 78.4 %   Platelets 215  150 - 400 K/uL  BASIC METABOLIC PANEL     Status: Abnormal   Collection Time   07/02/12  4:40 AM      Component Value Range   Sodium 134 (*) 135 - 145 mEq/L   Potassium 4.4  3.5 - 5.1 mEq/L   Chloride 100  96 - 112 mEq/L   CO2 27  19 - 32 mEq/L   Glucose, Bld 340 (*) 70 - 99 mg/dL   BUN 31 (*) 6 - 23 mg/dL   Creatinine, Ser 6.96  0.50 - 1.35 mg/dL   Calcium 7.7 (*) 8.4 - 10.5 mg/dL   GFR calc non Af Amer >90  >90 mL/min   GFR calc Af Amer >90  >90 mL/min  GLUCOSE, CAPILLARY     Status: Abnormal   Collection Time   07/02/12  8:06 AM      Component Value Range   Glucose-Capillary 125 (*) 70 - 99 mg/dL    Assessment & Plan: Present on Admission:  .Multiple rib fractures .TBI (traumatic brain  injury) .Extensive facial fractures .Leukocytosis .Lactic acidosis .Pleural effusion   LOS: 32 days   Additional comments:I reviewed the patient's new clinical lab test results.  [facility]  VDRF -- continue weaning with HTC today, cuff pressure good with new trach TBI w/ICC -- per NS. Supportive care, OT/PT as tolerated.  CVA -- per NS  Multiple facial fxs -- Non-operative per ENT  Multiple bilateral rib fxs w/HTPX s/p bilateral CT  ABL anemia -- stable UE DVT -- coumadin help for pending PEG, when INR < 2 will cover with Lovenox ID -- Levaquin 9/14d for Stenotrophomonas PNA ARF -- crt improved Cards -- Cardiology saw for heart block -  resolved FEN --  Tolerating new goal TF, PEG once INR down DM -- increased lantus with improvement in CBG, SSI Critical Care Total Time*: 30 Minutes  Violeta Gelinas, MD, MPH, FACS Pager: [phone]  07/02/2012  *Care during the described time interval was provided by me and/or other providers on the critical care team.  I have reviewed this patient's available data, including medical history, events of note, physical examination and test results as part of my evaluation.

## 2012-07-02 NOTE — Progress Notes (Signed)
Physical Therapy Treatment Patient Details Name: Alex Foster MRN: [id number] DOB: 09-Dec-1945 Today's Date: 07/02/2012 Time: 4098-1191 PT Time Calculation (min): 40 min  PT Assessment / Plan / Recommendation Comments on Treatment Session  Pt with TBI and L hemiparesis.  Vent settings PRVC at 40%.  Pt could sustain attention for 20-30 secs and could be refocused by name fairly easily.  Could move L LE to command.    Follow Up Recommendations  Post acute inpatient rehab    Barriers to Discharge        Equipment Recommendations  3 in 1 bedside comode    Recommendations for Other Services    Frequency Min 3X/week   Plan Discharge plan remains appropriate;Frequency remains appropriate    Precautions / Restrictions Precautions Precautions: Fall Precaution Comments: LT UE risk for subluxation due to no AROM noted Required Braces or Orthoses: Cervical Brace Cervical Brace: Hard collar;Applied in supine position Restrictions Weight Bearing Restrictions: No   Pertinent Vitals/Pain Supine BP before treatment 125/64; sitting 136/73 and 127/66; other vitals acceptable limits    Mobility  Bed Mobility Bed Mobility: Supine to Sit Rolling Right: 1: +2 Total assist Rolling Right: Patient Percentage: 10% Rolling Left: 1: +2 Total assist Rolling Left: Patient Percentage: 20% Supine to Sit: 1: +2 Total assist Supine to Sit: Patient Percentage: 10% Sitting - Scoot to Edge of Bed: 1: +1 Total assist Sit to Supine: 1: +2 Total assist Sit to Supine: Patient Percentage: 10% Scooting to HOB: 1: +2 Total assist Scooting to [facility]: Patient Percentage: 0% Transfers Transfers: Not assessed Ambulation/Gait Ambulation/Gait Assistance: Not tested (comment) Stairs: No Wheelchair Mobility Wheelchair Mobility: No Modified Rankin (Stroke Patients Only) Pre-Morbid Rankin Score: No symptoms Modified Rankin: Severe disability    Exercises Low Level/ICU Exercises Heel Slides:  AROM;Strengthening;Both;10 reps;Supine   PT Diagnosis:    PT Problem List:   PT Treatment Interventions:     PT Goals Acute Rehab PT Goals Time For Goal Achievement: 07/03/12 Potential to Achieve Goals: Fair PT Goal: Supine/Side to Sit - Progress: Progressing toward goal PT Goal: Sit at Edge Of Bed - Progress: Progressing toward goal PT Goal: Sit to Stand - Progress: Progressing toward goal PT Transfer Goal: Bed to Chair/Chair to Bed - Progress: Progressing toward goal Additional Goals Additional Goal #1: pt will participate in pregait activity at EOB with +2 Total A (pt =40%) PT Goal: Additional Goal #1 - Progress: Not met Additional Goal #2: Pt will follow one-step commands 100% of the time throughout session. PT Goal: Additional Goal #2 - Progress: Progressing toward goal  Visit Information  Last PT Received On: 07/02/12 Assistance Needed: +2 PT/OT Co-Evaluation/Treatment: Yes    Subjective Data      Cognition  Overall Cognitive Status: Impaired Area of Impairment: Attention;Following commands Arousal/Alertness: Awake/alert Behavior During Session: Restless Current Attention Level: Focused Attention - Other Comments: Pt able to maintain focused attention for 20-30 seconds during grooming and commands to identify fingers. Following Commands: Follows one step commands inconsistently Cognition - Other Comments: 50-60 % followin 1-step commands    Balance  Balance Balance Assessed: Yes Static Sitting Balance Static Sitting - Balance Support: Right upper extremity supported;Left upper extremity supported Static Sitting - Level of Assistance: 1: +2 Total assist;Other (comment) (For safety pt ( 20%)) Static Sitting - Comment/# of Minutes: 8-9 minutes with minimal truncal activation  End of Session PT - End of Session Activity Tolerance: Patient tolerated treatment well Patient left: in bed;with call bell/phone within reach;with restraints reapplied Nurse Communication:  Mobility status   GP     Arlethia Basso, Eliseo Gum 07/02/2012, 10:41 AM  07/02/2012  [place] Bing, PT [phone] (pager)

## 2012-07-02 NOTE — Progress Notes (Signed)
Speech Language Pathology Treatment Patient Details Name: Alex Foster MRN: [id number] DOB: 1945-11-30 Today's Date: 07/02/2012 Time: 4098-1191 SLP Time Calculation (min): 16 min  Assessment / Plan / Recommendation Clinical Impression   Skilled Treatment: Pt. seen for cognitive-linguistic treatment.  Currently on trach collar (for 20 minutes thus far).  RN reported pt. able to stay on trach collar for approximately 2 hours yesterday.  Pt. sustained attention to SLP 's questions for 1 minute intervals.  He followed simple 1 step commands with 90% accuracy with delayed initiation.  He mouthed numbers 1-5 and first/last name iin unison with SLP with mod-max verbal/visual cues.  Pt. answered yes/no questions via head gestures with 90% accuracy.   Pt. exhibited behaviors of a Rancho level V (confused/inappropriate) and Rancho VI (confused/apppropriate).  Pt. would benefit from a Passy-Muir speaking valve once able to consistently able to tolerate trach collar for 2-3 hours with vitals stable.  ST will continue to follow for cognition and appropriateness to request PMSV order.          SLP Plan  Continue with current plan of care       SLP Goals  SLP Goals Potential to Achieve Goals: Good SLP Goal #1: Pt. will sustain attention to activity/speaker for 15 minutes with mod verbal cues. SLP Goal #1 - Progress: Progressing toward goal SLP Goal #2: Pt. will follow 1 step commands with 90% accuracy. SLP Goal #2 - Progress: Progressing toward goal SLP Goal #3: Pt. will increase spatial and situational orientation via yes/no head gestures with 75% with mod environmental cues.  SLP Goal #3 - Progress: Progressing toward goal  General Temperature Spikes Noted: No Respiratory Status:  (currently on trach collar for 20 minutes) Behavior/Cognition: Alert;Cooperative;Requires cueing;Decreased sustained attention Oral Cavity - Dentition: Edentulous Patient Positioning: Upright in bed  Oral Cavity - Oral  Hygiene Does patient have any of the following "at risk" factors?: Lips - dry, cracked Brush patient's teeth BID with toothbrush (using toothpaste with fluoride): Yes Patient is HIGH RISK - Oral Care Protocol followed (see row info): Yes   Treatment Treatment focused on: Cognition (communication) Skilled Treatment: Pt. seen for cognitive-linguistic treatment.  Currently on trach collar (for 20 minutes thus far).  RN reported pt. able to stay on trach collar for approximately 2 hours yesterday.  Pt. sustained attention to SLP 's questions for 1 minute intervals.  He followed simple 1 step commands with 90% accuracy with delayed initiation.  He mouthed numbers 1-5 and first/last name iin unison with SLP with mod-max verbal/visual cues.  Pt. answered yes/no questions via head gestures with 90% accuracy.   Pt. exhibited behaviors of a Rancho level V (confused/inappropriate) and Rancho VI (confused/apppropriate).  Pt. would benefit from a Passy-Muir speaking valve once able to consistently able to tolerate trach collar for 2-3 hours with vitals stable.  ST will continue to follow for cognition and appropriateness to request PMSV order.            Alex Foster [place].Ed [employer] [phone]  07/02/2012

## 2012-07-02 NOTE — Progress Notes (Signed)
Occupational Therapy Treatment Patient Details Name: Alex Foster MRN: [id number] DOB: 04/05/1946 Today's Date: 07/02/2012 Time: 1191-4782 OT Time Calculation (min): 40 min  OT Assessment / Plan / Recommendation Comments on Treatment Session Pt with focused attention of 20 - 30 seconds during session.  Able to maintain eyes open and scan to the left and right to locate therapists but did not track moving object in any quadrant.  Able to tolerate sitting EOB for 8-9 mins as well with total assist.  Total assist for simple grooming task of washing his wace using the RUE.  No movement iniated in the LUE.    Follow Up Recommendations  Inpatient Rehab       Equipment Recommendations  3 in 1 bedside comode       Frequency Min 2X/week   Plan Discharge plan remains appropriate    Precautions / Restrictions Precautions Precautions: Fall Precaution Comments: LT UE risk for subluxation due to no AROM noted Required Braces or Orthoses: Cervical Brace Cervical Brace: Hard collar;Applied in supine position Restrictions Weight Bearing Restrictions: No   Pertinent Vitals/Pain BP supine with HOB up 136/73, in sitting EOB 127/66,  HR 81-85 throughout session.  Still on ventilator support.    ADL  Grooming: Performed;+1 Total assistance;Wash/dry face Where Assessed - Grooming: Supine, head of bed up Transfers/Ambulation Related to ADLs: not tested ADL Comments: Pt able to maintain eyes open during session.  Inconsistent with following one step commands.  Pt unable to follow moving object but could sustain visual attention at midline.  Able to identify correct number of fingers tharapist was holding 50% of the time.  Sat EOB for 8-9 mins with total assist for static sitting balance.    OT Goals Acute Rehab OT Goals OT Goal Formulation: With patient ADL Goals ADL Goal: Grooming - Progress: Progressing toward goals Miscellaneous OT Goals OT Goal: Miscellaneous Goal #1 - Progress: Progressing  toward goals OT Goal: Miscellaneous Goal #2 - Progress: Progressing toward goals Miscellaneous OT Goal #3: Pt will maintain static sitting EOB with min assist for 1 minute interval in preparation for selfcare tasks. OT Goal: Miscellaneous Goal #3 - Progress: Updated due to goal met  Visit Information  Last OT Received On: 07/02/12 Assistance Needed: +2    Subjective Data  Subjective: Pt currently ven and trached Patient Stated Goal: Pt not able to currently state      Cognition  Overall Cognitive Status: Impaired Area of Impairment: Attention;Following commands Arousal/Alertness: Awake/alert Behavior During Session: Restless Current Attention Level: Focused Attention - Other Comments: Pt able to maintain focused attention for 20-30 seconds during grooming and commands to identify fingers. Following Commands: Follows one step commands inconsistently Cognition - Other Comments: 50-60 % followin 1-step commands    Mobility  Shoulder Instructions Bed Mobility Bed Mobility: Supine to Sit Rolling Right: 1: +2 Total assist Rolling Right: Patient Percentage: 10% Rolling Left: 1: +2 Total assist Rolling Left: Patient Percentage: 20% Supine to Sit: 1: +2 Total assist Supine to Sit: Patient Percentage: 10% Sitting - Scoot to Edge of Bed: 1: +1 Total assist Sit to Supine: 1: +2 Total assist Sit to Supine: Patient Percentage: 10% Scooting to HOB: 1: +2 Total assist Scooting to [facility]: Patient Percentage: 0%       Exercises  Low Level/ICU Exercises Heel Slides: AROM;Strengthening;Both;10 reps;Supine   Balance Balance Balance Assessed: Yes Static Sitting Balance Static Sitting - Balance Support: Right upper extremity supported;Left upper extremity supported Static Sitting - Level of Assistance: 1: +2 Total  assist;Other (comment) (For safety pt ( 20%)) Static Sitting - Comment/# of Minutes: 8-9 minutes with minimal truncal activation   End of Session OT - End of Session Activity  Tolerance: Patient limited by fatigue Patient left: in bed;with family/visitor present      Ericca Labra OTR/L Pager number [phone] 07/02/2012, 10:41 AM

## 2012-07-02 NOTE — Progress Notes (Addendum)
RT called to room due to pt SpO2 87%.Attempted to suction and lavage. Pt had a small to moderate amount of thick yellow secretions. Increased O2 to 70% to keep SpO2 above 93%. RT will attempt to wean fio2 as tolerated. RN aware

## 2012-07-03 ENCOUNTER — Encounter (HOSPITAL_COMMUNITY): Admission: EM | Disposition: A | Discharge status: Rehabilitation Facility | Payer: Self-pay | Source: Home / Self Care

## 2012-07-03 ENCOUNTER — Inpatient Hospital Stay (HOSPITAL_COMMUNITY): Payer: Medicaid Other

## 2012-07-03 DIAGNOSIS — E46 Unspecified protein-calorie malnutrition: Secondary | ICD-10-CM

## 2012-07-03 DIAGNOSIS — I82409 Acute embolism and thrombosis of unspecified deep veins of unspecified lower extremity: Secondary | ICD-10-CM

## 2012-07-03 LAB — BASIC METABOLIC PANEL
BUN: 29 mg/dL — ABNORMAL HIGH (ref 6–23)
Chloride: 104 mEq/L (ref 96–112)
GFR calc Af Amer: >90 mL/min (ref >90)
GFR calc non Af Amer: 88 mL/min — ABNORMAL LOW (ref >90)
Potassium: 3.7 mEq/L (ref 3.5–5.1)
Sodium: 141 mEq/L (ref 135–145)

## 2012-07-03 LAB — CBC
HCT: 29.4 % — ABNORMAL LOW (ref 39.0–52.0)
MCHC: 31.3 g/dL (ref 30.0–36.0)
Platelets: 215 10*3/uL (ref 150–400)
RDW: 16.3 % — ABNORMAL HIGH (ref 11.5–15.5)
WBC: 6.3 10*3/uL (ref 4.0–10.5)

## 2012-07-03 LAB — GLUCOSE, CAPILLARY
Glucose-Capillary: 70 mg/dL (ref 70–99)
Glucose-Capillary: 109 mg/dL — ABNORMAL HIGH (ref 70–99)
Glucose-Capillary: 66 mg/dL — ABNORMAL LOW (ref 70–99)

## 2012-07-03 LAB — PROTIME-INR
INR: 1.63 — ABNORMAL HIGH (ref 0.00–1.49)
Prothrombin Time: 18.8 seconds — ABNORMAL HIGH (ref 11.6–15.2)

## 2012-07-03 SURGERY — EGD (ESOPHAGOGASTRODUODENOSCOPY)
Anesthesia: Moderate Sedation

## 2012-07-03 MED ORDER — ENOXAPARIN SODIUM 120 MG/0.8ML ~~LOC~~ SOLN
1.0000 mg/kg | Freq: Two times a day (BID) | SUBCUTANEOUS | Status: DC
  Filled 2012-07-03 (×2): qty 0.8

## 2012-07-03 MED ORDER — ENOXAPARIN SODIUM 120 MG/0.8ML ~~LOC~~ SOLN
1.0000 mg/kg | Freq: Two times a day (BID) | SUBCUTANEOUS | Status: DC
  Administered 2012-07-03 – 2012-07-04 (×2): 115 mg via SUBCUTANEOUS
  Administered 2012-07-04: 120 mg via SUBCUTANEOUS
  Administered 2012-07-05 (×2): 115 mg via SUBCUTANEOUS
  Filled 2012-07-03 (×7): qty 0.8

## 2012-07-03 MED ORDER — BIOTENE DRY MOUTH MT LIQD
15.0000 mL | Freq: Four times a day (QID) | OROMUCOSAL | Status: DC
  Administered 2012-07-03 – 2012-08-08 (×145): 15 mL via OROMUCOSAL

## 2012-07-03 MED ORDER — DEXTROSE 50 % IV SOLN
INTRAVENOUS | Status: AC
  Filled 2012-07-03: qty 50

## 2012-07-03 MED ORDER — DEXTROSE 50 % IV SOLN
25.0000 mL | Freq: Once | INTRAVENOUS | Status: AC | PRN
  Administered 2012-07-03: 25 mL via INTRAVENOUS

## 2012-07-03 MED ORDER — FENTANYL CITRATE 0.05 MG/ML IJ SOLN
100.0000 ug | Freq: Once | INTRAMUSCULAR | Status: AC
  Administered 2012-07-03: 100 ug via INTRAVENOUS

## 2012-07-03 MED ORDER — CLONAZEPAM 0.5 MG PO TABS
1.5000 mg | ORAL_TABLET | Freq: Three times a day (TID) | ORAL | Status: DC
  Administered 2012-07-03 – 2012-07-08 (×18): 1.5 mg
  Filled 2012-07-03 (×18): qty 3

## 2012-07-03 MED ORDER — LIDOCAINE HCL 2 % IJ SOLN
INTRAMUSCULAR | Status: AC
  Filled 2012-07-03: qty 20

## 2012-07-03 MED ORDER — DEXTROSE 50 % IV SOLN
INTRAVENOUS | Status: AC
  Administered 2012-07-03: 25 mL via INTRAVENOUS
  Filled 2012-07-03: qty 50

## 2012-07-03 MED ORDER — CHLORHEXIDINE GLUCONATE 0.12 % MT SOLN
15.0000 mL | Freq: Two times a day (BID) | OROMUCOSAL | Status: DC
  Administered 2012-07-03 – 2012-08-08 (×73): 15 mL via OROMUCOSAL
  Filled 2012-07-03 (×77): qty 15

## 2012-07-03 MED ORDER — MIDAZOLAM HCL 10 MG/2ML IJ SOLN: 5.0000 mg | Freq: Once | INTRAMUSCULAR | Status: AC

## 2012-07-03 NOTE — Op Note (Signed)
OPERATIVE REPORT  DATE OF OPERATION: 07/03/2012  PATIENT:  Alex Foster  66 y.o. male  PRE-OPERATIVE DIAGNOSIS:  Malnutrition with ventilator dependence   POST-OPERATIVE DIAGNOSIS:  Same  PROCEDURE:  Procedure(s): ESOPHAGOGASTRODUODENOSCOPY (EGD) PERCUTANEOUS ENDOSCOPIC GASTROSTOMY (PEG) PLACEMENT  SURGEON:  Surgeon(s): Cherylynn Ridges, MD  ASSISTANTLeotis Shames  ANESTHESIA:   local and IV sedation  EBL: <25 ml  BLOOD ADMINISTERED: none  DRAINS: Gastrostomy Tube   SPECIMEN:  No Specimen  COUNTS CORRECT:  YES  PROCEDURE DETAILS: This procedure was done at the bedside in the 2600 step down unit. The endoscopy laboratory personnel did assist with this procedure at the bedside.  After proper time out was performed identifying the patient and the procedure to be performed his abdomen and left upper quadrant was prepped and draped in usual sterile manner. An adult size Pentax scope was used to cannulate the oropharynx. This was after the patient had been given a proper amount of IV sedation using fentanyl and Versed.  We were able to cannulate the upper esophagus with the NG tube in place. We subsequently removed the NG tube. We passed the endoscope into the stomach and towards and into the duodenum were pictures were taken. This demonstrated some areas of erythema and possible gastritis. There was no overt bleeding.  With the endoscope pulled back into the midportion of the stomach the impulse from the system on the intra-abdominal wall was noted. Was at that site that the Angiocath was passed through the incision on the intra-abdominal wall into the stomach. A looped the blue wire was passed through the Angiocath which is married with a snare passed through the endoscope. We brought out through the patient's mouth where we looped it with the pull-through gastrostomy tube that was pulled it was proper position. The endoscope was passed back into the oropharynx through the esophagus down  to the stomach where we took pictures of his proper position.  All counts were correct the tube was secured in place in the usual manner.  PATIENT DISPOSITION:  Done at patient bedside, endoscopy lab assistance, stable at end of procedure   Juanitta Earnhardt O 10/2/20132:36 PM

## 2012-07-03 NOTE — Progress Notes (Signed)
Physical Therapy Treatment Patient Details Name: Alex Foster MRN: [id number] DOB: 08/19/1946 Today's Date: 07/03/2012 Time: 4098-1191 PT Time Calculation (min): 28 min  PT Assessment / Plan / Recommendation Comments on Treatment Session  PAtient with TBI and L hemiparesis.  Vent settings PRVC at 40%.  Pt. sustaining attention for 20-30 seconds.  Attempting to use L hemibody throughout sitting.     Follow Up Recommendations  Post acute inpatient rehab    Barriers to Discharge        Equipment Recommendations  3 in 1 bedside comode    Recommendations for Other Services    Frequency Min 3X/week   Plan Discharge plan remains appropriate;Frequency remains appropriate    Precautions / Restrictions Precautions Precautions: Fall Precaution Comments: L UE risk for subluxation due to no AROM noted Required Braces or Orthoses: Cervical Brace Cervical Brace: Hard collar;Applied in supine position Restrictions Weight Bearing Restrictions: No   Pertinent Vitals/Pain VSS, No pain    Mobility  Bed Mobility Bed Mobility: Supine to Sit Rolling Right: 1: +2 Total assist Rolling Right: Patient Percentage: 10% Rolling Left: 1: +2 Total assist Rolling Left: Patient Percentage: 20% Supine to Sit: 1: +2 Total assist Supine to Sit: Patient Percentage: 10% Sitting - Scoot to Edge of Bed: 1: +1 Total assist Sit to Supine: 1: +2 Total assist Sit to Supine: Patient Percentage: 10% Scooting to HOB: 1: +2 Total assist Scooting to [facility]: Patient Percentage: 0% Details for Bed Mobility Assistance: Patient needed assist for all bed mobility Transfers Transfers: Not assessed Ambulation/Gait Ambulation/Gait Assistance: Not tested (comment) Stairs: No Wheelchair Mobility Wheelchair Mobility: No    PT Goals Acute Rehab PT Goals PT Goal: Supine/Side to Sit - Progress: Progressing toward goal PT Goal: Sit at Edge Of Bed - Progress: Progressing toward goal Additional Goals PT Goal: Additional  Goal #2 - Progress: Progressing toward goal  Visit Information  Last PT Received On: 07/03/12 Assistance Needed: +2    Subjective Data  Subjective: Pt. with trach with vent.  Did not respond a lot today.  Follows commands inconsistently.   Cognition  Overall Cognitive Status: Impaired Area of Impairment: Attention;Following commands Arousal/Alertness: Awake/alert Orientation Level: Disoriented to;Place;Time;Situation Behavior During Session: Restless Current Attention Level: Focused Attention - Other Comments: Pt able to maintain focused attention for 20-30 seconds during grooming and commands to identify fingers. Following Commands: Follows one step commands inconsistently Cognition - Other Comments: 50-60% following 1 step commands    Balance  Static Sitting Balance Static Sitting - Balance Support: Right upper extremity supported;Left upper extremity supported;Feet supported Static Sitting - Level of Assistance: 1: +2 Total assist (pt = 20%) Static Sitting - Comment/# of Minutes: 10 minutes with minimal trunk activation.  Leans posteriorly.  Patient varied in amount of assist needed from periods of mod assist to total assist.    End of Session PT - End of Session Equipment Utilized During Treatment: Gait belt;Oxygen Activity Tolerance: Patient limited by fatigue Patient left: in bed;with call bell/phone within reach Nurse Communication: Mobility status;Need for lift equipment        INGOLD,Shannelle Alguire 07/03/2012, 11:53 AM  [facility] [phone] (pager)

## 2012-07-03 NOTE — Progress Notes (Signed)
Orthopedic Tech Progress Note Patient Details:  Alex Foster 02/08/1946 [id number]  Ortho Devices Type of Ortho Device: Abdominal binder Ortho Device/Splint Interventions: Casandra Doffing 07/03/2012, 6:09 PM

## 2012-07-03 NOTE — Progress Notes (Signed)
NGT found on floor upon arrival to room at 0050. New NGT placed with no complications and portable KUB completed. MD notified of KUB result and placement confirmed. Pivot 1.5 @40  will be continued.   M.Foster Simpson, RN

## 2012-07-03 NOTE — Progress Notes (Signed)
Hypoglycemic Event  CBG: 70  Treatment: D50 IV 25 mL  Symptoms: None  Follow-up CBG: Time:1635 CBG Result:109  Possible Reasons for Event: Inadequate meal intake  Comments/MD notified: PA Earney Hamburg notified, instructed nurse to administer 25ml of D5W.  Nurse administered as ordered, rechecked post 15 minutes, contacted PA with results.  PA ordered increase in current maintenance fluid.  Nurse will continue to monitor    Alex Foster  Remember to initiate Hypoglycemia Order Set & complete
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
## 2012-07-03 NOTE — Consult Note (Signed)
Wound care follow-up:  Trach site wounds improving.  Left side with previous suture sites with slough.  One site has healed and remaining area .1X.1X.1cm yellow.  Right side of trach with decreasing amt slough. Size unchanged, 80% yellow, 20% red, small yellow drainage.  Difficult to promote healing R/T large amt thick tan-green drainage constantly leaking around trach and soiling wound.  Continue present plan of care with Santyl to decrease nonviable tissue and foam dressing to protect site.  Cammie Mcgee, RN, MSN, [employer]  [phone]

## 2012-07-03 NOTE — Progress Notes (Signed)
INR is down.  Should be able to place PEG today.  This patient has been seen and I agree with the findings and treatment plan.  Marta Lamas. Gae Bon, MD, FACS [phone] (pager) [phone] (direct pager) Trauma Surgeon

## 2012-07-03 NOTE — Progress Notes (Signed)
Dr. Lindie Spruce requested for nurse to D/C tube feed at 0850 in preparation for Peg Tube placement scheduled later today.  Nurse discontinued tube feed and will continue to monitor

## 2012-07-03 NOTE — Progress Notes (Signed)
Per PA Jeffry; pt can get medications administered via tube prior to PEG placement scheduled later today.  Nurse will administer meds via tube as ordered.
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
## 2012-07-04 ENCOUNTER — Inpatient Hospital Stay (HOSPITAL_COMMUNITY): Payer: Medicaid Other

## 2012-07-04 ENCOUNTER — Encounter (HOSPITAL_COMMUNITY): Payer: Self-pay

## 2012-07-04 ENCOUNTER — Encounter (HOSPITAL_COMMUNITY): Admission: EM | Disposition: A | Discharge status: Rehabilitation Facility | Payer: Self-pay | Source: Home / Self Care

## 2012-07-04 LAB — PROTIME-INR: INR: 1.73 — ABNORMAL HIGH (ref 0.00–1.49)

## 2012-07-04 LAB — GLUCOSE, CAPILLARY
Glucose-Capillary: 112 mg/dL — ABNORMAL HIGH (ref 70–99)
Glucose-Capillary: 61 mg/dL — ABNORMAL LOW (ref 70–99)
Glucose-Capillary: 66 mg/dL — ABNORMAL LOW (ref 70–99)
Glucose-Capillary: 78 mg/dL (ref 70–99)
Glucose-Capillary: 85 mg/dL (ref 70–99)

## 2012-07-04 LAB — BASIC METABOLIC PANEL
CO2: 29 mEq/L (ref 19–32)
Calcium: 8.3 mg/dL — ABNORMAL LOW (ref 8.4–10.5)
Creatinine, Ser: 0.91 mg/dL (ref 0.50–1.35)

## 2012-07-04 SURGERY — INSERTION, PEG TUBE

## 2012-07-04 SURGERY — Surgical Case
Anesthesia: *Unknown

## 2012-07-04 MED ORDER — DEXTROSE 50 % IV SOLN
25.0000 mL | Freq: Once | INTRAVENOUS | Status: AC | PRN
  Administered 2012-07-04: 25 mL via INTRAVENOUS

## 2012-07-04 MED ORDER — DEXTROSE 50 % IV SOLN
INTRAVENOUS | Status: AC
  Administered 2012-07-04: 25 mL
  Filled 2012-07-04: qty 50

## 2012-07-04 MED ORDER — FUROSEMIDE 10 MG/ML IJ SOLN
20.0000 mg | Freq: Once | INTRAMUSCULAR | Status: AC
  Administered 2012-07-04: 20 mg via INTRAVENOUS

## 2012-07-04 MED ORDER — WARFARIN - PHARMACIST DOSING INPATIENT
Freq: Every day | Status: DC
  Administered 2012-07-13 – 2012-07-28 (×8)

## 2012-07-04 MED ORDER — WARFARIN SODIUM 4 MG PO TABS
4.0000 mg | ORAL_TABLET | Freq: Once | ORAL | Status: AC
Start: 2012-07-04 — End: 2012-07-04
  Administered 2012-07-04: 4 mg
  Filled 2012-07-04: qty 1

## 2012-07-04 MED ORDER — DEXTROSE 50 % IV SOLN: 25.0000 mL | Freq: Once | INTRAVENOUS | Status: AC | PRN

## 2012-07-04 MED ORDER — PIVOT 1.5 CAL PO LIQD
1000.0000 mL | ORAL | Status: DC
  Administered 2012-07-04 – 2012-07-15 (×7): 1000 mL
  Filled 2012-07-04 (×13): qty 1000

## 2012-07-04 MED ORDER — FUROSEMIDE 10 MG/ML IJ SOLN
40.0000 mg | Freq: Once | INTRAMUSCULAR | Status: AC
  Administered 2012-07-04: 40 mg via INTRAVENOUS
  Filled 2012-07-04 (×2): qty 4

## 2012-07-04 NOTE — Progress Notes (Signed)
Physical Therapy Treatment Patient Details Name: Alex Foster MRN: [id number] DOB: Nov 21, 1945 Today's Date: 07/04/2012 Time: 1140-1200 PT Time Calculation (min): 20 min  PT Assessment / Plan / Recommendation Comments on Treatment Session  Patient with TBI and L hemiparesis.  Vent settings at [facility] 70%.  Pt sustaining attention 30-45 seconds.  Continues to attempt to use L hemibody in sitting and while in bed.  Patient's goals revised today with 6/6 goals unmet due to slower progress than anticipated.      Follow Up Recommendations  Post acute inpatient rehab    Barriers to Discharge        Equipment Recommendations  3 in 1 bedside comode    Recommendations for Other Services    Frequency Min 3X/week   Plan Discharge plan remains appropriate;Frequency remains appropriate    Precautions / Restrictions Precautions Precautions: Fall Required Braces or Orthoses:  (No brace) Cervical Brace: Other (comment) Restrictions Weight Bearing Restrictions: No   Pertinent Vitals/Pain desat to 88% on vent, Some pain    Mobility  Bed Mobility Bed Mobility: Supine to Sit Rolling Right: 1: +2 Total assist Rolling Right: Patient Percentage: 20% Rolling Left: Not tested (comment) Supine to Sit: 1: +2 Total assist;With rails;HOB elevated Supine to Sit: Patient Percentage: 20% Sitting - Scoot to Edge of Bed: 1: +1 Total assist Sit to Supine: 1: +2 Total assist Sit to Supine: Patient Percentage: 10% Scooting to HOB: 1: +2 Total assist Scooting to [facility]: Patient Percentage: 0% Details for Bed Mobility Assistance: Patient initiating some LE and trunk movement to command to get to EOB.  Needs a lot of assist and cues still. Transfers Transfers: Not assessed Ambulation/Gait Ambulation/Gait Assistance: Not tested (comment) Stairs: No Wheelchair Mobility Wheelchair Mobility: No Modified Rankin (Stroke Patients Only) Pre-Morbid Rankin Score: No symptoms Modified Rankin: Severe disability      PT Goals Acute Rehab PT Goals Time For Goal Achievement: 07/18/12 Potential to Achieve Goals: Fair Pt will go Supine/Side to Sit: with max assist PT Goal: Supine/Side to Sit - Progress: Revised due to lack of progress Pt will Sit at Edge of Bed: 3-5 min;with mod assist;with unilateral upper extremity support PT Goal: Sit at Edge Of Bed - Progress: Revised due to lack of progress Pt will go Sit to Stand: with +2 total assist;with upper extremity assist (pt = 40%) PT Goal: Sit to Stand - Progress: Revised due to lack of progress Pt will Transfer Bed to Chair/Chair to Bed: with +2 total assist (pt=40%) PT Transfer Goal: Bed to Chair/Chair to Bed - Progress: Revised due to lack of progress Additional Goals Additional Goal #1: Pt. will participate in pregait activity at EOB with total a +2 (pt=40%) PT Goal: Additional Goal #1 - Progress: Revised due to lack of progress Additional Goal #2: Pt. will follow one-step commands 100% of the time throughout the session.   PT Goal: Additional Goal #2 - Progress: Revised dut to lack of progress  Visit Information  Last PT Received On: 07/04/12 Assistance Needed: +2    Subjective Data  Subjective: Pt. with trach with vent.  Answering yes/no questions and mouthing words today.  Following commands more consistently.   Cognition  Overall Cognitive Status: Impaired Area of Impairment: Attention;Following commands Arousal/Alertness: Awake/alert Orientation Level: Disoriented to;Place;Time;Situation Behavior During Session: Restless Current Attention Level: Focused Attention - Other Comments: Pt. focuses for 30-45 seconds.  Mouthing words.   Following Commands: Follows one step commands inconsistently Cognition - Other Comments: Following 1 step commands with greater accuracy  at up to 90 %.    Balance  Static Sitting Balance Static Sitting - Balance Support: Bilateral upper extremity supported;Feet supported Static Sitting - Level of Assistance:  1: +2 Total assist (pt = 20-40%) Static Sitting - Comment/# of Minutes: 10 minutes with minimal trunk activation.  Leans posteriorly and pushes to left with right hand.  Patient varied in amount of assist needed from periods of mod - tot assist.  Patient reached with right hand to touch PT's hand and kicked right LE to command while stitting EOB.    End of Session PT - End of Session Equipment Utilized During Treatment: Gait belt;Oxygen Activity Tolerance: Patient limited by fatigue Patient left: in bed;with call bell/phone within reach Nurse Communication: Mobility status;Need for lift equipment       INGOLD,Christl Fessenden 07/04/2012, 12:18 PM  [facility] Acute Rehabilitation [phone] (pager)

## 2012-07-04 NOTE — Plan of Care (Signed)
Problem: Phase II Progression Outcomes Goal: Effective improved ventilation & oxygenation Outcome: Progressing Pt continues to require FiO2 @ 70.

## 2012-07-04 NOTE — Progress Notes (Signed)
Hypoglycemic Event  CBG: 66  Treatment: D5W 25 ml IV push  Symptoms: asymptomatic  Follow-up CBG: Time:0445 CBG Result:93  Possible Reasons for Event: Pt NPO, Tube feedings will be resumed in AM.  Comments/MD notified:    Piel, Leilan Bochenek M  Remember to initiate Hypoglycemia Order Set & complete
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
## 2012-07-04 NOTE — Clinical Social Work Note (Signed)
Clinical Social Worker spoke with patient brother to update on patient discharge disposition and offer continued support.  Patient brother understands that patient has been clinically approved for [facility] in [place], [place], however no beds available at this time.  Patient brother is going to attempt to get all documents needed for Medicaid application to be completed.  Patient brother Alex Foster) is under the impression that patient has Medicare coverage.  Patient neighbor claims to have patient updated Medicare card and plans to fax to social worker once able.  Patient has been on Disability for the last 7 years per patient brother.    Clinical Social Worker will continue to follow for emotional support and to facilitate patient discharge needs.  Alex Foster, [place] [phone]

## 2012-07-04 NOTE — Progress Notes (Signed)
Hypoglycemic Event  CBG: 66  Treatment: Dextrose 25 ml IV push  Symptoms: asymptomatic  Follow-up CBG: Time:2045 CBG Result:111  Possible Reasons for Event: Pt is NPO. Tube feedings on hold at this time.  Comments/MD notified:Dr. Lindie Spruce  No new orders at this time. MD will resume tube feeding in AM.     Alex Foster M  Remember to initiate Hypoglycemia Order Set & complete

## 2012-07-04 NOTE — Progress Notes (Addendum)
Hypoglycemic Event  CBG: 61  Treatment: D5W 25 ml IV push  Symptoms: asymptomatic  Follow-up CBG: Time:0045 CBG Result:85  Possible Reasons for Event: Pt NPO. Tube feedings on hold at this time. Will be resumed in AM.   Comments/MD notified:Dr Wyatt  Orders to give another 25 ml D5W and continue to treat all hypoglycemic events until CBG is above 80. Pt resting and comfortable at this time. Will continue to monitor.    Drue Flirt  Remember to initiate Hypoglycemia Order Set & complete

## 2012-07-04 NOTE — Progress Notes (Addendum)
Patient ID: Alex Foster, male   DOB: 1946/05/30, 66 y.o.   MRN: [id number] Follow up - Trauma Critical Care  Patient Details:    Alex Foster is an 66 y.o. male.  Lines/tubes : Gastrostomy/Enterostomy PEG-jejunostomy LUQ (Active)  Surrounding Skin Dry;Intact;Reddened 07/04/2012  5:36 AM  Tube Status Clamped 07/04/2012  5:36 AM  Dressing Status Clean;Dry;Intact 07/04/2012  5:36 AM  Dressing Intervention New dressing 07/03/2012  8:00 PM  Dressing Type Split gauze;Abdominal Binder 07/04/2012  5:36 AM  Gastric Residual 0 mL 07/04/2012  5:36 AM  Intake (mL) 100 ml 07/04/2012  5:36 AM     Rectal Tube/Pouch (Active)  Output (mL) 100 mL 07/03/2012  6:00 PM  Intake (mL) 175 mL 06/29/2012  6:00 AM     Urethral Catheter Double-lumen 14 Fr. (Active)  Indication for Insertion or Continuance of Catheter Acute urinary retention 07/03/2012  8:00 PM  Site Assessment Clean;Intact;Dry 07/03/2012  8:00 PM  Collection Container Standard drainage bag 07/03/2012  8:00 PM  Securement Method Leg strap 07/03/2012  8:00 PM  Urinary Catheter Interventions Unclamped 07/03/2012  8:00 PM  Output (mL) 200 mL 07/04/2012  4:30 AM    Microbiology/Sepsis markers: Results for orders placed during the hospital encounter of 05/31/12  MRSA PCR SCREENING     Status: Abnormal   Collection Time   05/31/12  8:58 PM      Component Value Range Status Comment   MRSA by PCR INVALID RESULTS, SPECIMEN SENT FOR CULTURE (*) NEGATIVE Final   MRSA CULTURE     Status: Normal   Collection Time   05/31/12  8:58 PM      Component Value Range Status Comment   Specimen Description NOSE   Final    Special Requests NONE   Final    Culture     Final    Value: NO STAPHYLOCOCCUS AUREUS ISOLATED     Note: NO MRSA ISOLATED   Report Status 06/03/2012 FINAL   Final   CULTURE, RESPIRATORY     Status: Normal   Collection Time   06/02/12  4:07 PM      Component Value Range Status Comment   Specimen Description BRONCHIAL ALVEOLAR LAVAGE   Final    Special Requests NONE   Final    Gram Stain     Final    Value: ABUNDANT WBC PRESENT, PREDOMINANTLY PMN     NO SQUAMOUS EPITHELIAL CELLS SEEN     MODERATE GRAM POSITIVE COCCI     IN PAIRS RARE GRAM NEGATIVE RODS   Culture     Final    Value: MODERATE HAEMOPHILUS PARAINFLUENZAE     Note: BETA LACTAMASE NEGATIVE   Report Status 06/05/2012 FINAL   Final   CLOSTRIDIUM DIFFICILE BY PCR     Status: Normal   Collection Time   06/13/12  8:05 AM      Component Value Range Status Comment   C difficile by pcr NEGATIVE  NEGATIVE Final   URINE CULTURE     Status: Normal   Collection Time   06/16/12  9:00 PM      Component Value Range Status Comment   Specimen Description URINE, CLEAN CATCH   Final    Special Requests NONE   Final    Culture  Setup Time 06/16/2012 21:37   Final    Colony Count NO GROWTH   Final    Culture NO GROWTH   Final    Report Status 06/18/2012 FINAL   Final   CULTURE, BLOOD (  ROUTINE X 2)     Status: Normal   Collection Time   06/16/12  9:02 PM      Component Value Range Status Comment   Specimen Description BLOOD RIGHT FOOT   Final    Special Requests BOTTLES DRAWN AEROBIC ONLY 10CC   Final    Culture  Setup Time 06/17/2012 14:17   Final    Culture NO GROWTH 5 DAYS   Final    Report Status 06/23/2012 FINAL   Final   CULTURE, BLOOD (ROUTINE X 2)     Status: Normal   Collection Time   06/16/12  9:07 PM      Component Value Range Status Comment   Specimen Description BLOOD LEFT FOOT   Final    Special Requests BOTTLES DRAWN AEROBIC AND ANAEROBIC 10CC EACH   Final    Culture  Setup Time 06/17/2012 14:17   Final    Culture NO GROWTH 5 DAYS   Final    Report Status 06/23/2012 FINAL   Final   CULTURE, RESPIRATORY     Status: Normal   Collection Time   06/18/12 11:41 PM      Component Value Range Status Comment   Specimen Description TRACHEAL ASPIRATE   Final    Special Requests NONE   Final    Gram Stain     Final    Value: ABUNDANT WBC PRESENT, PREDOMINANTLY PMN      RARE SQUAMOUS EPITHELIAL CELLS PRESENT     FEW GRAM POSITIVE COCCI IN CLUSTERS     RARE GRAM POSITIVE RODS     RARE GRAM POSITIVE COCCI IN CHAINS   Culture Non-Pathogenic Oropharyngeal-type Flora Isolated.   Final    Report Status 06/21/2012 FINAL   Final   CULTURE, BLOOD (ROUTINE X 2)     Status: Normal   Collection Time   06/22/12  3:28 AM      Component Value Range Status Comment   Specimen Description BLOOD RIGHT ARM   Final    Special Requests BOTTLES DRAWN AEROBIC AND ANAEROBIC 10CC EACH   Final    Culture  Setup Time 06/22/2012 11:37   Final    Culture NO GROWTH 5 DAYS   Final    Report Status 06/28/2012 FINAL   Final   CULTURE, BLOOD (ROUTINE X 2)     Status: Normal   Collection Time   06/22/12  3:35 AM      Component Value Range Status Comment   Specimen Description BLOOD RIGHT HAND   Final    Special Requests BOTTLES DRAWN AEROBIC AND ANAEROBIC 10CC EACH   Final    Culture  Setup Time 06/22/2012 11:37   Final    Culture NO GROWTH 5 DAYS   Final    Report Status 06/28/2012 FINAL   Final   CULTURE, RESPIRATORY     Status: Normal   Collection Time   06/22/12  7:49 AM      Component Value Range Status Comment   Specimen Description TRACHEAL ASPIRATE   Final    Special Requests NONE   Final    Gram Stain     Final    Value: NO WBC SEEN     NO SQUAMOUS EPITHELIAL CELLS SEEN     RARE GRAM NEGATIVE RODS   Culture MODERATE STENOTROPHOMONAS MALTOPHILIA   Final    Report Status 06/25/2012 FINAL   Final    Organism ID, Bacteria STENOTROPHOMONAS MALTOPHILIA   Final   URINE CULTURE  Status: Normal   Collection Time   06/22/12  5:12 PM      Component Value Range Status Comment   Specimen Description URINE, CATHETERIZED   Final    Special Requests NONE   Final    Culture  Setup Time 06/23/2012 11:39   Final    Colony Count NO GROWTH   Final    Culture NO GROWTH   Final    Report Status 06/24/2012 FINAL   Final     Anti-infectives:  Anti-infectives     Start     Dose/Rate  Route Frequency Ordered Stop   06/24/12 1800   levofloxacin (LEVAQUIN) IVPB 750 mg        750 mg 100 mL/hr over 90 Minutes Intravenous Every 24 hours 06/24/12 1539 07/08/12 1759   06/23/12 1000   fluconazole (DIFLUCAN) IVPB 100 mg  Status:  Discontinued        100 mg 50 mL/hr over 60 Minutes Intravenous Every 24 hours 06/22/12 1632 07/01/12 0809   06/23/12 0000   vancomycin (VANCOCIN) 1,500 mg in sodium chloride 0.9 % 500 mL IVPB  Status:  Discontinued        1,500 mg 250 mL/hr over 120 Minutes Intravenous Every 24 hours 06/22/12 1145 06/24/12 1539   06/22/12 1730   fluconazole (DIFLUCAN) IVPB 200 mg        200 mg 100 mL/hr over 60 Minutes Intravenous  Once 06/22/12 1630 06/22/12 1852   06/19/12 2200   vancomycin (VANCOCIN) IVPB 1000 mg/200 mL premix  Status:  Discontinued        1,000 mg 200 mL/hr over 60 Minutes Intravenous Every 12 hours 06/19/12 0940 06/22/12 1123   06/19/12 1000   vancomycin (VANCOCIN) 2,000 mg in sodium chloride 0.9 % 500 mL IVPB        2,000 mg 250 mL/hr over 120 Minutes Intravenous  Once 06/19/12 0940 06/19/12 1300   06/19/12 0930   ceFEPIme (MAXIPIME) 2 g in dextrose 5 % 50 mL IVPB  Status:  Discontinued        2 g 100 mL/hr over 30 Minutes Intravenous 3 times per day 06/19/12 0904 06/24/12 1539   06/04/12 1000   vancomycin (VANCOCIN) IVPB 1000 mg/200 mL premix  Status:  Discontinued        1,000 mg 200 mL/hr over 60 Minutes Intravenous Every 12 hours 06/04/12 0837 06/05/12 1045   06/03/12 1800   vancomycin (VANCOCIN) 1,500 mg in sodium chloride 0.9 % 500 mL IVPB  Status:  Discontinued        1,500 mg 250 mL/hr over 120 Minutes Intravenous Every 24 hours 06/02/12 1428 06/04/12 0837   06/02/12 1800   vancomycin (VANCOCIN) 2,000 mg in sodium chloride 0.9 % 500 mL IVPB        2,000 mg 250 mL/hr over 120 Minutes Intravenous  Once 06/02/12 1428 06/02/12 2024   06/02/12 1400   cefTAZidime (FORTAZ) 1 g in dextrose 5 % 50 mL IVPB        1 g 100 mL/hr over  30 Minutes Intravenous 3 times per day 06/02/12 1349 06/12/12 1610          Best Practice/Protocols:  VTE Prophylaxis: Lovenox (full dose)   Consults: Treatment Team:  Melvenia Beam, MD    Studies:   Subjective:    Overnight Issues:   Objective:  Vital signs for last 24 hours: Temp:  [97.7 F (36.5 C)-99.9 F (37.7 C)] 97.7 F (36.5 C) (10/03 0749) Pulse Rate:  [  71-93] 76  (10/03 0749) Resp:  [19-22] 22  (10/03 0749) BP: (99-144)/(48-84) 123/53 mmHg (10/03 0749) SpO2:  [88 %-96 %] 96 % (10/03 0749) FiO2 (%):  [40 %-70 %] 69.7 % (10/03 0434) Weight:  [112.5 kg (248 lb 0.3 oz)] 112.5 kg (248 lb 0.3 oz) (10/03 0428)  Hemodynamic parameters for last 24 hours:    Intake/Output from previous day: 10/02 0701 - 10/03 0700 In: 1440 [I.V.:1050; NG/GT:40; IV Piggyback:150] Out: 1650 [Urine:1500; Stool:150]  Intake/Output this shift:    Vent settings for last 24 hours: Vent Mode:  [-] PRVC FiO2 (%):  [40 %-70 %] 69.7 % Set Rate:  [22 bmp] 22 bmp Vt Set:  [650 mL] 650 mL PEEP:  [5 cmH20] 5 cmH20 Plateau Pressure:  [20 cmH20-26 cmH20] 26 cmH20  Physical Exam:  General: on vent HEENT/Neck: trach-clean, intact Resp: clear to auscultation bilaterally GI: soft, NT, binder on, PEG site OK Extremities: edema 1+ Neuro: F/C with R side and moves L toes  Results for orders placed during the hospital encounter of 05/31/12 (from the past 24 hour(s))  GLUCOSE, CAPILLARY     Status: Abnormal   Collection Time   07/03/12  8:19 AM      Component Value Range   Glucose-Capillary 149 (*) 70 - 99 mg/dL   Comment 1 Notify RN    GLUCOSE, CAPILLARY     Status: Abnormal   Collection Time   07/03/12 12:08 PM      Component Value Range   Glucose-Capillary 134 (*) 70 - 99 mg/dL   Comment 1 Notify RN    GLUCOSE, CAPILLARY     Status: Normal   Collection Time   07/03/12  3:55 PM      Component Value Range   Glucose-Capillary 70  70 - 99 mg/dL  GLUCOSE, CAPILLARY     Status: Abnormal    Collection Time   07/03/12  4:39 PM      Component Value Range   Glucose-Capillary 109 (*) 70 - 99 mg/dL   Comment 1 Notify RN    GLUCOSE, CAPILLARY     Status: Abnormal   Collection Time   07/03/12  8:29 PM      Component Value Range   Glucose-Capillary 66 (*) 70 - 99 mg/dL  GLUCOSE, CAPILLARY     Status: Abnormal   Collection Time   07/03/12  9:01 PM      Component Value Range   Glucose-Capillary 111 (*) 70 - 99 mg/dL   Comment 1 Documented in Chart     Comment 2 Notify RN    GLUCOSE, CAPILLARY     Status: Abnormal   Collection Time   07/04/12 12:42 AM      Component Value Range   Glucose-Capillary 61 (*) 70 - 99 mg/dL  GLUCOSE, CAPILLARY     Status: Normal   Collection Time   07/04/12  1:01 AM      Component Value Range   Glucose-Capillary 85  70 - 99 mg/dL  GLUCOSE, CAPILLARY     Status: Normal   Collection Time   07/04/12  1:30 AM      Component Value Range   Glucose-Capillary 78  70 - 99 mg/dL  GLUCOSE, CAPILLARY     Status: Abnormal   Collection Time   07/04/12  4:12 AM      Component Value Range   Glucose-Capillary 66 (*) 70 - 99 mg/dL  GLUCOSE, CAPILLARY     Status: Normal   Collection Time  07/04/12  4:48 AM      Component Value Range   Glucose-Capillary 93  70 - 99 mg/dL    Assessment & Plan: Present on Admission:  .Multiple rib fractures .TBI (traumatic brain injury) .Extensive facial fractures .Leukocytosis .Lactic acidosis .Pleural effusion   LOS: 34 days   Additional comments:I reviewed the patient's new clinical lab test results.  [facility]  VDRF -- FiO2 at 70% with sats low 90s - check CXR, cont BDs, lasix TBI w/ICC -- per NS. Supportive care, OT/PT as tolerated.  CVA -- per NS  Multiple facial fxs -- Non-operative per ENT  Multiple bilateral rib fxs w/HTPX s/p bilateral CT  ABL anemia -- stable  UE DVT -- Lovenox and resume coumadin Urinary retention -- foley replaced ID -- Levaquin 10/14d for Stenotrophomonas PNA  DM -- Good control FEN --  resume TF via PEG, check labs P Dispo -- Continue SDU, try to wean to TC if oxygenation improves Critical Care Total Time*: 32 Minutes  Violeta Gelinas, MD, MPH, FACS Pager: [phone]  07/04/2012  *Care during the described time interval was provided by me and/or other providers on the critical care team.  I have reviewed this patient's available data, including medical history, events of note, physical examination and test results as part of my evaluation.
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
## 2012-07-05 ENCOUNTER — Inpatient Hospital Stay (HOSPITAL_COMMUNITY): Payer: Medicaid Other

## 2012-07-05 LAB — BASIC METABOLIC PANEL
CO2: 29 mEq/L (ref 19–32)
Chloride: 94 mEq/L — ABNORMAL LOW (ref 96–112)
GFR calc Af Amer: 83 mL/min — ABNORMAL LOW (ref >90)
Potassium: 3.5 mEq/L (ref 3.5–5.1)

## 2012-07-05 LAB — CBC
HCT: 30.7 % — ABNORMAL LOW (ref 39.0–52.0)
Hemoglobin: 9.6 g/dL — ABNORMAL LOW (ref 13.0–17.0)
MCV: 88.2 fL (ref 78.0–100.0)
RBC: 3.48 MIL/uL — ABNORMAL LOW (ref 4.22–5.81)
WBC: 8.7 10*3/uL (ref 4.0–10.5)

## 2012-07-05 LAB — GLUCOSE, CAPILLARY
Glucose-Capillary: 160 mg/dL — ABNORMAL HIGH (ref 70–99)
Glucose-Capillary: 136 mg/dL — ABNORMAL HIGH (ref 70–99)
Glucose-Capillary: 163 mg/dL — ABNORMAL HIGH (ref 70–99)

## 2012-07-05 LAB — CLOSTRIDIUM DIFFICILE BY PCR: Toxigenic C. Difficile by PCR: NEGATIVE

## 2012-07-05 LAB — PROTIME-INR
INR: 1.81 — ABNORMAL HIGH (ref 0.00–1.49)
Prothrombin Time: 20.3 seconds — ABNORMAL HIGH (ref 11.6–15.2)

## 2012-07-05 LAB — PRO B NATRIURETIC PEPTIDE: Pro B Natriuretic peptide (BNP): 384.6 pg/mL — ABNORMAL HIGH (ref 0–125)

## 2012-07-05 MED ORDER — FUROSEMIDE 10 MG/ML IJ SOLN
40.0000 mg | Freq: Once | INTRAMUSCULAR | Status: AC
  Administered 2012-07-05: 40 mg via INTRAVENOUS
  Filled 2012-07-05: qty 4

## 2012-07-05 MED ORDER — FREE WATER
200.0000 mL | Freq: Three times a day (TID) | Status: DC
  Administered 2012-07-05 – 2012-07-08 (×10): 200 mL

## 2012-07-05 MED ORDER — WARFARIN SODIUM 4 MG PO TABS
4.0000 mg | ORAL_TABLET | Freq: Once | ORAL | Status: AC
  Administered 2012-07-05: 4 mg
  Filled 2012-07-05: qty 1

## 2012-07-05 NOTE — Clinical Social Work Note (Signed)
Clinical Social Worker continuing to follow patient for emotional support and discharge planning needs.  Patient discharge plan remains [facility] in [place] at this time.  Per facility bed should be available by Tuesday.  Patient brothers to be in town this weekend and weekend CSW to provide with admission paperwork to complete and leave here for patient admission to facility next week.    Clinical Social Worker will remain available for emotional support and to facilitate patient discharge needs once bed available.  Macario Golds, [place] [phone]
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
## 2012-07-05 NOTE — Progress Notes (Signed)
Speech Language Pathology Treatment Patient Details Name: Alex Foster MRN: [id number] DOB: Jul 22, 1946 Today's Date: 07/05/2012 Time: 9562-1308 SLP Time Calculation (min): 10 min  Assessment / Plan / Recommendation Clinical Impression  Treatment focused on cognition.  Pt. somewhat more lethargic due to long period of nursing care prior to this SLP's arrival.  Pt. exhibited decreased ability to locate SLP's face.  He mouth several words in appropriate context x 2 (mouthed "what" and "yes").  Pt. able to follow simple one step commands with 60% accuracy with mod-max verbal/visual cues.  No response to yes/no question related to spatial orientation.  Presented as a [place] level V today (confused;non-agitated-inappropriate).  Continue ST treatment.    SLP Plan  Continue with current plan of care       SLP Goals  SLP Goals Potential to Achieve Goals: Fair Potential Considerations: Severity of impairments SLP Goal #1: Pt. will sustain attention to activity/speaker for 15 minutes with mod verbal cues. SLP Goal #1 - Progress: Progressing toward goal SLP Goal #2: Pt. will follow 1 step commands with 90% accuracy. SLP Goal #2 - Progress: Progressing toward goal SLP Goal #3: Pt. will increase spatial and situational orientation via yes/no head gestures with 75% with mod environmental cues.  SLP Goal #3 - Progress: Progressing toward goal  General Temperature Spikes Noted: No Respiratory Status: Ventilator Behavior/Cognition: Lethargic;Requires cueing;Decreased sustained attention Oral Cavity - Dentition: Edentulous Patient Positioning: Upright in bed  Oral Cavity - Oral Hygiene Does patient have any of the following "at risk" factors?: Lips - dry, cracked Brush patient's teeth BID with toothbrush (using toothpaste with fluoride): Yes Patient is HIGH RISK - Oral Care Protocol followed (see row info): Yes   Treatment Treatment focused on: Cognition Skilled Treatment: see impression  statement        Royce Macadamia M.Ed [employer] [phone]  07/05/2012

## 2012-07-05 NOTE — Progress Notes (Addendum)
Follow up - Trauma and Critical Care  Patient Details:    Alex Foster is an 66 y.o. male.  Lines/tubes : Gastrostomy/Enterostomy PEG-jejunostomy LUQ (Active)  Surrounding Skin Dry;Intact;Reddened 07/05/2012  6:50 AM  Tube Status Other (Comment) 07/05/2012  6:50 AM  Dressing Status Clean;Dry;Intact 07/05/2012  6:50 AM  Dressing Intervention New dressing 07/03/2012  8:00 PM  Dressing Type Split gauze;Abdominal Binder 07/05/2012  6:50 AM  Gastric Residual 0 mL 07/05/2012  6:50 AM  Intake (mL) 120 ml 07/04/2012  6:00 PM     Rectal Tube/Pouch (Active)  Output (mL) 100 mL 07/03/2012  6:00 PM  Intake (mL) 175 mL 06/29/2012  6:00 AM     Urethral Catheter Double-lumen 14 Fr. (Active)  Indication for Insertion or Continuance of Catheter Acute urinary retention 07/04/2012  8:00 PM  Site Assessment Clean;Intact;Dry 07/04/2012  8:00 PM  Collection Container Standard drainage bag 07/04/2012  8:00 PM  Securement Method Leg strap 07/04/2012  8:00 PM  Urinary Catheter Interventions Unclamped 07/04/2012  8:00 PM  Output (mL) 450 mL 07/04/2012  8:00 PM    Microbiology/Sepsis markers: Results for orders placed during the hospital encounter of 05/31/12  MRSA PCR SCREENING     Status: Abnormal   Collection Time   05/31/12  8:58 PM      Component Value Range Status Comment   MRSA by PCR INVALID RESULTS, SPECIMEN SENT FOR CULTURE (*) NEGATIVE Final   MRSA CULTURE     Status: Normal   Collection Time   05/31/12  8:58 PM      Component Value Range Status Comment   Specimen Description NOSE   Final    Special Requests NONE   Final    Culture     Final    Value: NO STAPHYLOCOCCUS AUREUS ISOLATED     Note: NO MRSA ISOLATED   Report Status 06/03/2012 FINAL   Final   CULTURE, RESPIRATORY     Status: Normal   Collection Time   06/02/12  4:07 PM      Component Value Range Status Comment   Specimen Description BRONCHIAL ALVEOLAR LAVAGE   Final    Special Requests NONE   Final    Gram Stain     Final    Value:  ABUNDANT WBC PRESENT, PREDOMINANTLY PMN     NO SQUAMOUS EPITHELIAL CELLS SEEN     MODERATE GRAM POSITIVE COCCI     IN PAIRS RARE GRAM NEGATIVE RODS   Culture     Final    Value: MODERATE HAEMOPHILUS PARAINFLUENZAE     Note: BETA LACTAMASE NEGATIVE   Report Status 06/05/2012 FINAL   Final   CLOSTRIDIUM DIFFICILE BY PCR     Status: Normal   Collection Time   06/13/12  8:05 AM      Component Value Range Status Comment   C difficile by pcr NEGATIVE  NEGATIVE Final   URINE CULTURE     Status: Normal   Collection Time   06/16/12  9:00 PM      Component Value Range Status Comment   Specimen Description URINE, CLEAN CATCH   Final    Special Requests NONE   Final    Culture  Setup Time 06/16/2012 21:37   Final    Colony Count NO GROWTH   Final    Culture NO GROWTH   Final    Report Status 06/18/2012 FINAL   Final   CULTURE, BLOOD (ROUTINE X 2)     Status: Normal   Collection  Time   06/16/12  9:02 PM      Component Value Range Status Comment   Specimen Description BLOOD RIGHT FOOT   Final    Special Requests BOTTLES DRAWN AEROBIC ONLY 10CC   Final    Culture  Setup Time 06/17/2012 14:17   Final    Culture NO GROWTH 5 DAYS   Final    Report Status 06/23/2012 FINAL   Final   CULTURE, BLOOD (ROUTINE X 2)     Status: Normal   Collection Time   06/16/12  9:07 PM      Component Value Range Status Comment   Specimen Description BLOOD LEFT FOOT   Final    Special Requests BOTTLES DRAWN AEROBIC AND ANAEROBIC 10CC EACH   Final    Culture  Setup Time 06/17/2012 14:17   Final    Culture NO GROWTH 5 DAYS   Final    Report Status 06/23/2012 FINAL   Final   CULTURE, RESPIRATORY     Status: Normal   Collection Time   06/18/12 11:41 PM      Component Value Range Status Comment   Specimen Description TRACHEAL ASPIRATE   Final    Special Requests NONE   Final    Gram Stain     Final    Value: ABUNDANT WBC PRESENT, PREDOMINANTLY PMN     RARE SQUAMOUS EPITHELIAL CELLS PRESENT     FEW GRAM POSITIVE  COCCI IN CLUSTERS     RARE GRAM POSITIVE RODS     RARE GRAM POSITIVE COCCI IN CHAINS   Culture Non-Pathogenic Oropharyngeal-type Flora Isolated.   Final    Report Status 06/21/2012 FINAL   Final   CULTURE, BLOOD (ROUTINE X 2)     Status: Normal   Collection Time   06/22/12  3:28 AM      Component Value Range Status Comment   Specimen Description BLOOD RIGHT ARM   Final    Special Requests BOTTLES DRAWN AEROBIC AND ANAEROBIC 10CC EACH   Final    Culture  Setup Time 06/22/2012 11:37   Final    Culture NO GROWTH 5 DAYS   Final    Report Status 06/28/2012 FINAL   Final   CULTURE, BLOOD (ROUTINE X 2)     Status: Normal   Collection Time   06/22/12  3:35 AM      Component Value Range Status Comment   Specimen Description BLOOD RIGHT HAND   Final    Special Requests BOTTLES DRAWN AEROBIC AND ANAEROBIC 10CC EACH   Final    Culture  Setup Time 06/22/2012 11:37   Final    Culture NO GROWTH 5 DAYS   Final    Report Status 06/28/2012 FINAL   Final   CULTURE, RESPIRATORY     Status: Normal   Collection Time   06/22/12  7:49 AM      Component Value Range Status Comment   Specimen Description TRACHEAL ASPIRATE   Final    Special Requests NONE   Final    Gram Stain     Final    Value: NO WBC SEEN     NO SQUAMOUS EPITHELIAL CELLS SEEN     RARE GRAM NEGATIVE RODS   Culture MODERATE STENOTROPHOMONAS MALTOPHILIA   Final    Report Status 06/25/2012 FINAL   Final    Organism ID, Bacteria STENOTROPHOMONAS MALTOPHILIA   Final   URINE CULTURE     Status: Normal   Collection Time   06/22/12  5:12 PM      Component Value Range Status Comment   Specimen Description URINE, CATHETERIZED   Final    Special Requests NONE   Final    Culture  Setup Time 06/23/2012 11:39   Final    Colony Count NO GROWTH   Final    Culture NO GROWTH   Final    Report Status 06/24/2012 FINAL   Final     Anti-infectives:  Anti-infectives     Start     Dose/Rate Route Frequency Ordered Stop   06/24/12 1800   levofloxacin  (LEVAQUIN) IVPB 750 mg        750 mg 100 mL/hr over 90 Minutes Intravenous Every 24 hours 06/24/12 1539 07/08/12 1759   06/23/12 1000   fluconazole (DIFLUCAN) IVPB 100 mg  Status:  Discontinued        100 mg 50 mL/hr over 60 Minutes Intravenous Every 24 hours 06/22/12 1632 07/01/12 0809   06/23/12 0000   vancomycin (VANCOCIN) 1,500 mg in sodium chloride 0.9 % 500 mL IVPB  Status:  Discontinued        1,500 mg 250 mL/hr over 120 Minutes Intravenous Every 24 hours 06/22/12 1145 06/24/12 1539   06/22/12 1730   fluconazole (DIFLUCAN) IVPB 200 mg        200 mg 100 mL/hr over 60 Minutes Intravenous  Once 06/22/12 1630 06/22/12 1852   06/19/12 2200   vancomycin (VANCOCIN) IVPB 1000 mg/200 mL premix  Status:  Discontinued        1,000 mg 200 mL/hr over 60 Minutes Intravenous Every 12 hours 06/19/12 0940 06/22/12 1123   06/19/12 1000   vancomycin (VANCOCIN) 2,000 mg in sodium chloride 0.9 % 500 mL IVPB        2,000 mg 250 mL/hr over 120 Minutes Intravenous  Once 06/19/12 0940 06/19/12 1300   06/19/12 0930   ceFEPIme (MAXIPIME) 2 g in dextrose 5 % 50 mL IVPB  Status:  Discontinued        2 g 100 mL/hr over 30 Minutes Intravenous 3 times per day 06/19/12 0904 06/24/12 1539   06/04/12 1000   vancomycin (VANCOCIN) IVPB 1000 mg/200 mL premix  Status:  Discontinued        1,000 mg 200 mL/hr over 60 Minutes Intravenous Every 12 hours 06/04/12 0837 06/05/12 1045   06/03/12 1800   vancomycin (VANCOCIN) 1,500 mg in sodium chloride 0.9 % 500 mL IVPB  Status:  Discontinued        1,500 mg 250 mL/hr over 120 Minutes Intravenous Every 24 hours 06/02/12 1428 06/04/12 0837   06/02/12 1800   vancomycin (VANCOCIN) 2,000 mg in sodium chloride 0.9 % 500 mL IVPB        2,000 mg 250 mL/hr over 120 Minutes Intravenous  Once 06/02/12 1428 06/02/12 2024   06/02/12 1400   cefTAZidime (FORTAZ) 1 g in dextrose 5 % 50 mL IVPB        1 g 100 mL/hr over 30 Minutes Intravenous 3 times per day 06/02/12 1349  06/12/12 9147          Best Practice/Protocols:  VTE Prophylaxis: Lovenox (full dose) GI Prophylaxis: Proton Pump Inhibitor Also on coumadin for DVT in upper extremity Intermittent Sedation  Consults: Treatment Team:  Melvenia Beam, MD    Events:  Subjective:    Overnight Issues: Had to increase FIO2 because of desaturation.  Tracheostomy tube gets pulled back significantly, and appear to affect how he oxygenates and ventilates.  Seems  to improve with manipulation.  Objective:  Vital signs for last 24 hours: Temp:  [97.7 F (36.5 C)-100.9 F (38.3 C)] 98.7 F (37.1 C) (10/04 0700) Pulse Rate:  [84-106] 84  (10/04 0700) Resp:  [21-26] 22  (10/04 0700) BP: (100-137)/(55-68) 104/57 mmHg (10/04 0700) SpO2:  [88 %-97 %] 94 % (10/04 0700) FiO2 (%):  [69.8 %-80 %] 80 % (10/04 0530) Weight:  [108.6 kg (239 lb 6.7 oz)] 108.6 kg (239 lb 6.7 oz) (10/04 0500)  Hemodynamic parameters for last 24 hours:    Intake/Output from previous day: 10/03 0701 - 10/04 0700 In: 2520 [I.V.:1440; NG/GT:610; IV Piggyback:150] Out: 3525 [Urine:3200; Stool:325]  Intake/Output this shift: Total I/O In: -  Out: 300 [Urine:300]  Vent settings for last 24 hours: Vent Mode:  [-] PRVC FiO2 (%):  [69.8 %-80 %] 80 % Set Rate:  [22 bmp] 22 bmp Vt Set:  [650 mL] 650 mL PEEP:  [4.7 cmH20-5.1 cmH20] 5 cmH20 Plateau Pressure:  [17 cmH20-24 cmH20] 18 cmH20  Physical Exam:  General: No purposeful movement or actions. Neuro: agitated, weakness left upper extremity and weakness left lower extremity HEENT/Neck: Tach site is less erythematous, but pulled back and leaking. Resp: clear to auscultation bilaterally and Cleared after suctioning with a mild amount of wheezing on the left side.  Lots of secretions. GI: soft, nontender, BS WNL, no r/g and tolerating tube feedings at goal. Extremities: edema 1+ and less edema than before  Results for orders placed during the hospital encounter of 05/31/12  (from the past 24 hour(s))  GLUCOSE, CAPILLARY     Status: Normal   Collection Time   07/04/12 11:37 AM      Component Value Range   Glucose-Capillary 90  70 - 99 mg/dL   Comment 1 Notify RN    GLUCOSE, CAPILLARY     Status: Abnormal   Collection Time   07/04/12  3:44 PM      Component Value Range   Glucose-Capillary 101 (*) 70 - 99 mg/dL   Comment 1 Notify RN    GLUCOSE, CAPILLARY     Status: Abnormal   Collection Time   07/04/12  7:50 PM      Component Value Range   Glucose-Capillary 120 (*) 70 - 99 mg/dL  GLUCOSE, CAPILLARY     Status: Abnormal   Collection Time   07/04/12 11:45 PM      Component Value Range   Glucose-Capillary 112 (*) 70 - 99 mg/dL  GLUCOSE, CAPILLARY     Status: Abnormal   Collection Time   07/05/12  3:50 AM      Component Value Range   Glucose-Capillary 160 (*) 70 - 99 mg/dL  PROTIME-INR     Status: Abnormal   Collection Time   07/05/12  5:25 AM      Component Value Range   Prothrombin Time 20.3 (*) 11.6 - 15.2 seconds   INR 1.81 (*) 0.00 - 1.49  BASIC METABOLIC PANEL     Status: Abnormal   Collection Time   07/05/12  5:25 AM      Component Value Range   Sodium 132 (*) 135 - 145 mEq/L   Potassium 3.5  3.5 - 5.1 mEq/L   Chloride 94 (*) 96 - 112 mEq/L   CO2 29  19 - 32 mEq/L   Glucose, Bld 181 (*) 70 - 99 mg/dL   BUN 25 (*) 6 - 23 mg/dL   Creatinine, Ser 1.47  0.50 - 1.35 mg/dL  Calcium 8.0 (*) 8.4 - 10.5 mg/dL   GFR calc non Af Amer 71 (*) >90 mL/min   GFR calc Af Amer 83 (*) >90 mL/min  CBC     Status: Abnormal   Collection Time   07/05/12  5:25 AM      Component Value Range   WBC 8.7  4.0 - 10.5 K/uL   RBC 3.48 (*) 4.22 - 5.81 MIL/uL   Hemoglobin 9.6 (*) 13.0 - 17.0 g/dL   HCT 47.8 (*) 29.5 - 62.1 %   MCV 88.2  78.0 - 100.0 fL   MCH 27.6  26.0 - 34.0 pg   MCHC 31.3  30.0 - 36.0 g/dL   RDW 30.8 (*) 65.7 - 84.6 %   Platelets 209  150 - 400 K/uL  GLUCOSE, CAPILLARY     Status: Abnormal   Collection Time   07/05/12  8:04 AM      Component  Value Range   Glucose-Capillary 136 (*) 70 - 99 mg/dL     Assessment/Plan:   NEURO  Altered Mental Status:  agitation, sedation and not following commands.   Plan: CPM with intermittent sedation.  PULM  Atelectasis/collapse (focal) and Interstitial Lung Disease: interstitial edema/wet Pneumonia: hospital acquired (not ventilator-associated) Getting the last treatment for Steno Chronic Respiratory Failure   Plan: Continue to monitor and possible pulmonary edema.  CARDIO  No specific issues   Plan: CPM  RENAL  No specific issues, creatinine has improved.  Will try to diurese   Plan: CPM  GI  No specific issues. Also has significant diarrhea, will recheck C. diff   Plan: CPM/tube feedings.  Recheck c. diff  ID  Pneumonia (hospital acquired (not ventilator-associated) As mentioned earlier, Steno Rx)   Plan: CPM  HEME  Anemia anemia of chronic disease and anemia of renal disease)   Plan: CPM  ENDO No specific issues   Plan: CPM  Global Issues  Patient continues to have some problems with his tracheostomy because of movement and pulling.  Readjusted it again today.  Sats better, but still on FIO2 of 80%.  Will try to sean FIO2 for sats >92%.  Will also recheck CXR for possible need for Lasix.  Readjust IVFs, and start intermittent free water in G-tube.    LOS: 35 days   Additional comments:I reviewed the patient's new clinical lab test results. cbc/bmet  Critical Care Total Time*: 30 Minutes  Shardai Star O 07/05/2012  *Care during the described time interval was provided by me and/or other providers on the critical care team.  I have reviewed this patient's available data, including medical history, events of note, physical examination and test results as part of my evaluation.

## 2012-07-06 LAB — BASIC METABOLIC PANEL
BUN: 32 mg/dL — ABNORMAL HIGH (ref 6–23)
Chloride: 95 mEq/L — ABNORMAL LOW (ref 96–112)
GFR calc Af Amer: 79 mL/min — ABNORMAL LOW (ref >90)
GFR calc non Af Amer: 68 mL/min — ABNORMAL LOW (ref >90)
Potassium: 4 mEq/L (ref 3.5–5.1)

## 2012-07-06 LAB — GLUCOSE, CAPILLARY
Glucose-Capillary: 118 mg/dL — ABNORMAL HIGH (ref 70–99)
Glucose-Capillary: 155 mg/dL — ABNORMAL HIGH (ref 70–99)
Glucose-Capillary: 192 mg/dL — ABNORMAL HIGH (ref 70–99)
Glucose-Capillary: 177 mg/dL — ABNORMAL HIGH (ref 70–99)

## 2012-07-06 LAB — PROTIME-INR
INR: 1.62 — ABNORMAL HIGH (ref 0.00–1.49)
Prothrombin Time: 18.7 seconds — ABNORMAL HIGH (ref 11.6–15.2)

## 2012-07-06 LAB — CBC WITH DIFFERENTIAL/PLATELET
Basophils Absolute: 0 10*3/uL (ref 0.0–0.1)
Basophils Relative: 0 % (ref 0–1)
HCT: 29.4 % — ABNORMAL LOW (ref 39.0–52.0)
Hemoglobin: 9.5 g/dL — ABNORMAL LOW (ref 13.0–17.0)
Lymphocytes Relative: 15 % (ref 12–46)
MCHC: 32.3 g/dL (ref 30.0–36.0)
Monocytes Absolute: 1.1 10*3/uL — ABNORMAL HIGH (ref 0.1–1.0)
Monocytes Relative: 11 % (ref 3–12)
Neutro Abs: 7.1 10*3/uL (ref 1.7–7.7)
Neutrophils Relative %: 73 % (ref 43–77)
RDW: 15.9 % — ABNORMAL HIGH (ref 11.5–15.5)
WBC: 9.8 10*3/uL (ref 4.0–10.5)

## 2012-07-06 MED ORDER — WARFARIN SODIUM 6 MG PO TABS
6.0000 mg | ORAL_TABLET | Freq: Once | ORAL | Status: AC
  Administered 2012-07-06: 6 mg via ORAL
  Filled 2012-07-06: qty 1

## 2012-07-06 MED ORDER — ENOXAPARIN SODIUM 100 MG/ML ~~LOC~~ SOLN
100.0000 mg | Freq: Two times a day (BID) | SUBCUTANEOUS | Status: DC
  Administered 2012-07-06 (×2): 100 mg via SUBCUTANEOUS
  Filled 2012-07-06 (×4): qty 1

## 2012-07-06 NOTE — Progress Notes (Signed)
07/05/12 @ 2000 RN updated Dr. Barrie Dunker about patient continuing to  pull at trach, iv line, foley catheter, and flexiseal. RN has attempted all other non restraint techniques without success. Dr. Magnus Ivan gave a verbal order, which was read back per protocol,  for  restraints medical non-violent.
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
## 2012-07-07 LAB — GLUCOSE, CAPILLARY
Glucose-Capillary: 127 mg/dL — ABNORMAL HIGH (ref 70–99)
Glucose-Capillary: 146 mg/dL — ABNORMAL HIGH (ref 70–99)
Glucose-Capillary: 172 mg/dL — ABNORMAL HIGH (ref 70–99)
Glucose-Capillary: 78 mg/dL (ref 70–99)
Glucose-Capillary: 85 mg/dL (ref 70–99)
Glucose-Capillary: 89 mg/dL (ref 70–99)

## 2012-07-07 MED ORDER — POTASSIUM CHLORIDE IN NACL 20-0.9 MEQ/L-% IV SOLN
INTRAVENOUS | Status: DC
  Administered 2012-07-07: 13:00:00 via INTRAVENOUS
  Filled 2012-07-07 (×2): qty 1000

## 2012-07-07 MED ORDER — ENOXAPARIN SODIUM 120 MG/0.8ML ~~LOC~~ SOLN
110.0000 mg | Freq: Two times a day (BID) | SUBCUTANEOUS | Status: DC
  Administered 2012-07-07 – 2012-07-11 (×10): 110 mg via SUBCUTANEOUS
  Filled 2012-07-07 (×13): qty 0.8

## 2012-07-07 MED ORDER — WARFARIN SODIUM 6 MG PO TABS
6.0000 mg | ORAL_TABLET | Freq: Once | ORAL | Status: AC
  Administered 2012-07-07: 6 mg via ORAL
  Filled 2012-07-07: qty 1

## 2012-07-07 NOTE — Progress Notes (Signed)
CSW met with the patient's family to sign [facility] admission paperwork. The patient's brother Duffy Rhody [phone]) signed the paperwork. Duffy Rhody said brother, Christen Bame went to pick up the patient's Medicare card from the patient's friend. At this time, until Medicare care is provided to [facility], patient will transfer to [facility] when a bed is available.  Weekday CSW will continue to follow the patient for discharge planning.  Lia Foyer, LCSWA [facility] Clinical Social Worker Contact #: [phone] (weekend)

## 2012-07-07 NOTE — Progress Notes (Signed)
Subjective: Fever overnight or 101 Tolerating TF's  Objective: Vital signs in last 24 hours: Temp:  [98.2 F (36.8 C)-101 F (38.3 C)] 98.2 F (36.8 C) (10/06 0750) Pulse Rate:  [83-105] 87  (10/06 0900) Resp:  [11-26] 15  (10/06 0900) BP: (94-144)/(48-85) 136/65 mmHg (10/06 0900) SpO2:  [91 %-98 %] 94 % (10/06 0900) FiO2 (%):  [59.7 %-60.5 %] 60.1 % (10/06 0900) Weight:  [235 lb 14.3 oz (107 kg)] 235 lb 14.3 oz (107 kg) (10/06 0428) Last BM Date: 07/06/12  Intake/Output from previous day: 10/05 0701 - 10/06 0700 In: 1773 [I.V.:363; NG/GT:320; IV Piggyback:210] Out: 1650 [Urine:1525; Stool:125] Intake/Output this shift: Total I/O In: 80 [Other:80] Out: 150 [Urine:150]  Lungs with coarse BS bilaterally Abdomen soft, mildly full  Lab Results:   Basename 07/06/12 0600 07/05/12 0525  WBC 9.8 8.7  HGB 9.5* 9.6*  HCT 29.4* 30.7*  PLT 190 209   BMET  Basename 07/06/12 0600 07/05/12 0525  NA 133* 132*  K 4.0 3.5  CL 95* 94*  CO2 29 29  GLUCOSE 168* 181*  BUN 32* 25*  CREATININE 1.11 1.07  CALCIUM 8.3* 8.0*   PT/INR  Basename 07/07/12 0500 07/06/12 0600  LABPROT 19.6* 18.7*  INR 1.72* 1.62*   ABG No results found for this basename: PHART:2,PCO2:2,PO2:2,HCO3:2 in the last 72 hours  Studies/Results: No results found.  Anti-infectives: Anti-infectives     Start     Dose/Rate Route Frequency Ordered Stop   06/24/12 1800   levofloxacin (LEVAQUIN) IVPB 750 mg        750 mg 100 mL/hr over 90 Minutes Intravenous Every 24 hours 06/24/12 1539 07/08/12 1759   06/23/12 1000   fluconazole (DIFLUCAN) IVPB 100 mg  Status:  Discontinued        100 mg 50 mL/hr over 60 Minutes Intravenous Every 24 hours 06/22/12 1632 07/01/12 0809   06/23/12 0000   vancomycin (VANCOCIN) 1,500 mg in sodium chloride 0.9 % 500 mL IVPB  Status:  Discontinued        1,500 mg 250 mL/hr over 120 Minutes Intravenous Every 24 hours 06/22/12 1145 06/24/12 1539   06/22/12 1730   fluconazole  (DIFLUCAN) IVPB 200 mg        200 mg 100 mL/hr over 60 Minutes Intravenous  Once 06/22/12 1630 06/22/12 1852   06/19/12 2200   vancomycin (VANCOCIN) IVPB 1000 mg/200 mL premix  Status:  Discontinued        1,000 mg 200 mL/hr over 60 Minutes Intravenous Every 12 hours 06/19/12 0940 06/22/12 1123   06/19/12 1000   vancomycin (VANCOCIN) 2,000 mg in sodium chloride 0.9 % 500 mL IVPB        2,000 mg 250 mL/hr over 120 Minutes Intravenous  Once 06/19/12 0940 06/19/12 1300   06/19/12 0930   ceFEPIme (MAXIPIME) 2 g in dextrose 5 % 50 mL IVPB  Status:  Discontinued        2 g 100 mL/hr over 30 Minutes Intravenous 3 times per day 06/19/12 0904 06/24/12 1539   06/04/12 1000   vancomycin (VANCOCIN) IVPB 1000 mg/200 mL premix  Status:  Discontinued        1,000 mg 200 mL/hr over 60 Minutes Intravenous Every 12 hours 06/04/12 0837 06/05/12 1045   06/03/12 1800   vancomycin (VANCOCIN) 1,500 mg in sodium chloride 0.9 % 500 mL IVPB  Status:  Discontinued        1,500 mg 250 mL/hr over 120 Minutes Intravenous Every 24 hours  06/02/12 1428 06/04/12 0837   06/02/12 1800   vancomycin (VANCOCIN) 2,000 mg in sodium chloride 0.9 % 500 mL IVPB        2,000 mg 250 mL/hr over 120 Minutes Intravenous  Once 06/02/12 1428 06/02/12 2024   06/02/12 1400   cefTAZidime (FORTAZ) 1 g in dextrose 5 % 50 mL IVPB        1 g 100 mL/hr over 30 Minutes Intravenous 3 times per day 06/02/12 1349 06/12/12 0642          Assessment/Plan: s/p Procedure(s) (LRB) with comments: PERCUTANEOUS TRACHEOSTOMY (N/Foster) PERCUTANEOUS ENDOSCOPIC GASTROSTOMY (PEG) PLACEMENT (N/Foster)  Continue IV antibiotics and supportive care Check labs and CXR in Foster.m.  LOS: 37 days    Alex Foster 07/07/2012
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
## 2012-07-08 ENCOUNTER — Inpatient Hospital Stay (HOSPITAL_COMMUNITY): Payer: Medicaid Other

## 2012-07-08 LAB — URINALYSIS, MICROSCOPIC ONLY
Hgb urine dipstick: NEGATIVE
Leukocytes, UA: NEGATIVE
Nitrite: NEGATIVE
Protein, ur: NEGATIVE mg/dL
Specific Gravity, Urine: 1.02 (ref 1.005–1.030)
Urobilinogen, UA: 2 mg/dL — ABNORMAL HIGH (ref 0.0–1.0)

## 2012-07-08 LAB — COMPREHENSIVE METABOLIC PANEL
AST: 20 U/L (ref 0–37)
Albumin: 1.9 g/dL — ABNORMAL LOW (ref 3.5–5.2)
Chloride: 97 mEq/L (ref 96–112)
Creatinine, Ser: 1.05 mg/dL (ref 0.50–1.35)
Potassium: 3.6 mEq/L (ref 3.5–5.1)
Total Bilirubin: 0.6 mg/dL (ref 0.3–1.2)
Total Protein: 5.8 g/dL — ABNORMAL LOW (ref 6.0–8.3)

## 2012-07-08 LAB — CBC
MCHC: 32.1 g/dL (ref 30.0–36.0)
MCV: 87.3 fL (ref 78.0–100.0)
Platelets: 181 10*3/uL (ref 150–400)
RDW: 15.9 % — ABNORMAL HIGH (ref 11.5–15.5)
WBC: 5.6 10*3/uL (ref 4.0–10.5)

## 2012-07-08 LAB — GLUCOSE, CAPILLARY
Glucose-Capillary: 129 mg/dL — ABNORMAL HIGH (ref 70–99)
Glucose-Capillary: 115 mg/dL — ABNORMAL HIGH (ref 70–99)
Glucose-Capillary: 180 mg/dL — ABNORMAL HIGH (ref 70–99)

## 2012-07-08 LAB — PROTIME-INR: INR: 1.58 — ABNORMAL HIGH (ref 0.00–1.49)

## 2012-07-08 MED ORDER — WARFARIN SODIUM 7.5 MG PO TABS
7.5000 mg | ORAL_TABLET | Freq: Once | ORAL | Status: AC
  Administered 2012-07-08: 7.5 mg via ORAL
  Filled 2012-07-08: qty 1
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
## 2012-07-08 NOTE — Clinical Social Work Note (Signed)
Clinical Social Worker continuing to follow patient for support and discharge planning needs.  CSW saw copy of patient Medicare card in the chart, however its effective date is for 10-02-2012.  CSW spoke with patient brother over the phone to relay message and confirm patient plans to discharge to [facility] in [place].  Patient brother remains agreeable and has completed all necessary paperwork at this time.  CSW spoke with facility who has submitted all the paperwork to the corporate financial office regarding patient Medicaid application and admission paperwork.  Facility requested that CSW follow up tomorrow for bed availability and patient approval for financial means.    Clinical Social Worker to follow up with patient and patient family for support and to facilitate patient discharge needs.  Macario Golds, [place] [phone]

## 2012-07-08 NOTE — Progress Notes (Signed)
Physical Therapy Treatment Patient Details Name: Alex Foster MRN: [id number] DOB: [birth year] Today's Date: 07/08/2012 Time: 7829-5621 PT Time Calculation (min): 23 min  PT Assessment / Plan / Recommendation Comments on Treatment Session  Pt with very slow progression at this time Re: mobility.      Follow Up Recommendations  Post acute inpatient     Does the patient have the potential to tolerate intense rehabilitation  Yes, Recommend IP Rehab Screening  Barriers to Discharge        Equipment Recommendations  3 in 1 bedside comode    Recommendations for Other Services    Frequency Min 3X/week   Plan Discharge plan remains appropriate;Frequency remains appropriate    Precautions / Restrictions Precautions Precautions: Fall Restrictions Weight Bearing Restrictions: No       Mobility  Bed Mobility Bed Mobility: Supine to Sit;Sit to Supine;Sitting - Scoot to Edge of Bed Supine to Sit: 1: +2 Total assist;HOB elevated Supine to Sit: Patient Percentage: 10% Sitting - Scoot to Edge of Bed: 1: +1 Total assist Sit to Supine: 1: +2 Total assist;HOB flat Sit to Supine: Patient Percentage: 10% Scooting to HOB: 1: +2 Total assist Scooting to [facility]: Patient Percentage: 0% Details for Bed Mobility Assistance: assist for all components of transition.  Slight initiation noted with UE's & LE's.  Transfers Transfers: Not assessed Ambulation/Gait Ambulation/Gait Assistance: Not tested (comment)    Exercises       PT Goals Acute Rehab PT Goals Time For Goal Achievement: 07/18/12 Potential to Achieve Goals: Fair Pt will go Supine/Side to Sit: with max assist PT Goal: Supine/Side to Sit - Progress: Not met Pt will Sit at [facility] of Bed: 3-5 min;with mod assist;with unilateral upper extremity support PT Goal: Sit at [facility] Of Bed - Progress: Not met  Visit Information  Last PT Received On: 07/08/12 Assistance Needed: +2    Subjective Data  Subjective: Pt. with trach with vent.   Answering yes/no questions and mouthing words today.  Following commands more consistently.   Cognition  Overall Cognitive Status: Impaired Area of Impairment: Attention;Following commands Arousal/Alertness: Awake/alert Behavior During Session: [facility] for tasks performed Current Attention Level: Sustained    Balance  Static Sitting Balance Static Sitting - Balance Support: Bilateral upper extremity supported;Feet supported Static Sitting - Level of Assistance: 1: +2 Total assist Static Sitting - Comment/# of Minutes: assist for postural control.  No trunk muscle activation noted & no sefl-attempt to correct   End of Session PT - End of Session Activity Tolerance: Patient limited by fatigue Patient left: in bed;with call bell/phone within reach;with nursing in room     [place], [place] [phone] 07/08/2012
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
## 2012-07-09 ENCOUNTER — Inpatient Hospital Stay (HOSPITAL_COMMUNITY): Payer: Medicaid Other

## 2012-07-09 LAB — CBC
Hemoglobin: 9.1 g/dL — ABNORMAL LOW (ref 13.0–17.0)
MCH: 27.3 pg (ref 26.0–34.0)
Platelets: 177 10*3/uL (ref 150–400)
RBC: 3.33 MIL/uL — ABNORMAL LOW (ref 4.22–5.81)
WBC: 5.8 10*3/uL (ref 4.0–10.5)

## 2012-07-09 LAB — PROTIME-INR
INR: 1.69 — ABNORMAL HIGH (ref 0.00–1.49)
Prothrombin Time: 19.3 seconds — ABNORMAL HIGH (ref 11.6–15.2)

## 2012-07-09 LAB — GLUCOSE, CAPILLARY: Glucose-Capillary: 134 mg/dL — ABNORMAL HIGH (ref 70–99)

## 2012-07-09 LAB — BASIC METABOLIC PANEL
CO2: 30 mEq/L (ref 19–32)
Calcium: 8.6 mg/dL (ref 8.4–10.5)
Glucose, Bld: 134 mg/dL — ABNORMAL HIGH (ref 70–99)
Potassium: 3.6 mEq/L (ref 3.5–5.1)
Sodium: 139 mEq/L (ref 135–145)

## 2012-07-09 MED ORDER — GUAIFENESIN 100 MG/5ML PO SYRP
200.0000 mg | ORAL_SOLUTION | Freq: Four times a day (QID) | ORAL | Status: DC
Start: 2012-07-09 — End: 2012-07-09
  Filled 2012-07-09 (×5): qty 118

## 2012-07-09 MED ORDER — GUAIFENESIN 100 MG/5ML PO SOLN
10.0000 mL | Freq: Four times a day (QID) | ORAL | Status: DC
  Administered 2012-07-09 (×4): 200 mg via ORAL
  Filled 2012-07-09 (×9): qty 10

## 2012-07-09 MED ORDER — WARFARIN SODIUM 7.5 MG PO TABS
7.5000 mg | ORAL_TABLET | Freq: Once | ORAL | Status: AC
  Administered 2012-07-09: 7.5 mg
  Filled 2012-07-09: qty 1

## 2012-07-09 MED ORDER — CLONAZEPAM 0.5 MG PO TABS
1.0000 mg | ORAL_TABLET | Freq: Three times a day (TID) | ORAL | Status: DC
  Administered 2012-07-09 – 2012-07-18 (×28): 1 mg
  Filled 2012-07-09 (×12): qty 2
  Filled 2012-07-09: qty 1
  Filled 2012-07-09: qty 2
  Filled 2012-07-09: qty 1
  Filled 2012-07-09 (×7): qty 2
  Filled 2012-07-09 (×2): qty 1
  Filled 2012-07-09 (×4): qty 2
  Filled 2012-07-09: qty 3
  Filled 2012-07-09: qty 2

## 2012-07-09 NOTE — Consult Note (Signed)
Wound care follow-up:  Trach site wounds improved.  Left side of trach pink and dry with previous wounds healed.  Right trach site previously 100% slough, now 95% red 5% yellow, small pink drainage, no odor.  Plan:  D?C Santyl.  Foam dressing to protect from pressure from trach faceplate and promote healing. Will not plan to follow further unless re-consulted.  [address] RN, MSN, [employer]  [phone]
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
## 2012-07-09 NOTE — Clinical Social Work Note (Signed)
Clinical Social Worker spoke continuing to follow for vent SNF placement.  Patient remains medically ready at this time, however per facility, patient [place] not yet been [place].  Patient will be a Letter of Guarantee placement and not other bed offers have been extended at this time.  CSW has contacted to facility to discuss what needs to be accomplished in order for patient to transfer - facility working on [facility] application and necessary documents.  Patient is ready and bed available once patient financially appropriate to discharge to [facility] [place].  Clinical Social Worker spoke with patient brother Alex Foster over the speaker phone while he was in the [employer] [place] [employer] [place] in [place], [place].  Alex Foster provided permission to answer questions and provide information to questions asked of the personal he was seeking legal advice from.  CSW spoke on speaker phone with Alex Foster) C. Coley  [phone]) of the law firm to answer questions regarding guardianship and the process of how healthcare decisions were made for patient during hospitalization.  Patient brother plans to continue to work with law firm to determine the most effective solution of handling patient financial affairs.  Clinical Social Worker will facilitate patient discharge needs once patient is able to transfer to facility.  Macario Golds, [place] [phone]

## 2012-07-09 NOTE — Progress Notes (Signed)
Physical Therapy Treatment Patient Details Name: Alex Foster MRN: [id number] DOB: 23-Dec-1945 Today's Date: 07/09/2012 Time: 7829-5621 PT Time Calculation (min): 38 min  PT Assessment / Plan / Recommendation Comments on Treatment Session  Pt able to tolerate bed>recliner today but cont's to require +2 total assist.  Increased movement with LLE noted but none with LUE at this date.      Follow Up Recommendations  Post acute inpatient     Does the patient have the potential to tolerate intense rehabilitation  Yes, Recommend IP Rehab Screening  Barriers to Discharge        Equipment Recommendations  3 in 1 bedside comode    Recommendations for Other Services Rehab consult  Frequency Min 3X/week   Plan Discharge plan remains appropriate;Frequency remains appropriate    Precautions / Restrictions Precautions Precautions: Fall Precaution Comments: LUE shoulder anterior inferior subluxation. Restrictions Weight Bearing Restrictions: No       Mobility  Bed Mobility Bed Mobility: Supine to Sit Rolling Right: 1: +2 Total assist Rolling Right: Patient Percentage: 20% Details for Bed Mobility Assistance: Pt able to initiate LE movement towards EOB but required total assist to lift shoulders/trunk to sitting upright & use of draw pad to pivot hips & bring closer to EOB.   Transfers Transfers: Stand Pivot Transfers Sit to Stand: 1: +2 Total assist;Without upper extremity assist;From bed Sit to Stand: Patient Percentage: 10% Stand Pivot Transfers: 1: +2 Total assist Stand Pivot Transfers: Patient Percentage: 10% Details for Transfer Assistance: Cues for technique.  Assist with use of draw pad to lift hips off of bed & to pivot from bed>recliner.   Ambulation/Gait Ambulation/Gait Assistance: Not tested (comment)      PT Goals Acute Rehab PT Goals Time For Goal Achievement: 07/18/12 Potential to Achieve Goals: Fair Pt will go Supine/Side to Sit: with max assist PT Goal:  Supine/Side to Sit - Progress: Progressing toward goal Pt will Sit at Edge of Bed: 3-5 min;with mod assist;with unilateral upper extremity support PT Goal: Sit at Edge Of Bed - Progress: Progressing toward goal Pt will go Sit to Stand: with +2 total assist;with upper extremity assist PT Goal: Sit to Stand - Progress: Progressing toward goal Pt will Transfer Bed to Chair/Chair to Bed: with +2 total assist PT Transfer Goal: Bed to Chair/Chair to Bed - Progress: Progressing toward goal Additional Goals Additional Goal #1: Pt. will participate in pregait activity at EOB with total a +2 (pt=40%)  Visit Information  Last PT Received On: 07/09/12 Assistance Needed: +2    Subjective Data      Cognition  Overall Cognitive Status: Impaired Area of Impairment: Attention;Following commands Arousal/Alertness: Awake/alert Behavior During Session: [facility] for tasks performed Current Attention Level: Focused Following Commands: Follows one step commands inconsistently Cognition - Other Comments: Able to follow one step verbal commmnads 70 % of the time pertaining to washing his face, scanning for therapist's fingers and reaching for therapists hand or bed rail.    Balance  Balance Balance Assessed: Yes Static Sitting Balance Static Sitting - Balance Support: Bilateral upper extremity supported;Feet supported Static Sitting - Level of Assistance: 1: +1 Total assist Static Sitting - Comment/# of Minutes: Pt pushes to Lt side.  No attempt to self-correct LOB noted from pt, however when performing weightbearing through R elbow/forearm, pt was able to assist with RUE to push himself back to midline.    End of Session PT - End of Session Equipment Utilized During Treatment: Gait belt;Oxygen Activity Tolerance: Patient limited  by fatigue Patient left: in chair;with call bell/phone within reach Nurse Communication: Mobility status;Need for lift equipment    Verdell Face, [place] [phone] 07/09/2012

## 2012-07-09 NOTE — Progress Notes (Signed)
Orthopedic Tech Progress Note Patient Details:  Alex Foster 1945/10/24 [id number] Nurse called for abdominal binder; stated order was in Nursing orders. Binder delivered to department and left at desk with nurse secretary in exchange for patient sticker. Ortho Devices Type of Ortho Device: Abdominal binder Ortho Device/Splint Interventions: Ordered   [place] R Thompson 07/09/2012, 1:47 PM

## 2012-07-09 NOTE — Progress Notes (Signed)
Occupational Therapy Treatment Patient Details Name: Alex Foster MRN: [id number] DOB: 09-22-1946 Today's Date: 07/09/2012 Time: 2536-6440 OT Time Calculation (min): 31 min  OT Assessment / Plan / Recommendation Comments on Treatment Session Pt continues to make progress with OT sessions.  More alert and able to follow simple one step functional commands using the RUE with approximately 70-80 % accurracy.  Still needs total assist +2 for all mobility from supine to sitting and for transfer out of bed.  Total assist for sitting balance as well.  Able to perform simple grooming task in supported sitting with mod assist to wash his face.      Follow Up Recommendations  Inpatient Rehab       Equipment Recommendations  3 in 1 bedside comode    Recommendations for Other Services Rehab consult  Frequency Min 2X/week   Plan Discharge plan remains appropriate    Precautions / Restrictions Precautions Precautions: Fall Precaution Comments: LUE shoulder anterior inferior subluxation. Restrictions Weight Bearing Restrictions: No   Pertinent Vitals/Pain Vitals stable on trach vent, HR 80-85 O2 89-92    ADL  Grooming: Performed;Wash/dry face;Moderate assistance Where Assessed - Grooming: Supported sitting Toilet Transfer: Chief of Staff: Patient Percentage: 10% Toilet Transfer Method: Stand pivot Transfers/Ambulation Related to ADLs: Pt required total +2 (pt 10%) for stand pivot transfer to bedside chair. ADL Comments: Pt able to maintain alertness throughout session.  Decreased ability to scan to the left with his right eye to identify number of fingers that therapist was holding up.  However he was correct on 3/3 attempts in medial and slightly left of midline visual field.  Required max demonstrational cueing to initiate washing his face using the LUE, but once therapist guided it to his face, he was able to help.  Required total assist to sit EOB.  Able to  initiate right sidelying to sit with max assist.  Demonstrates pusher strategies to the left side as well.  When asked to close his left hand noted trace digit flexion which was not present last session.     OT Goals ADL Goals Pt Will Perform Grooming: with min assist;Sitting, chair;Supported;Other (comment) (2 tasks with no more than mod instructional cueing.) ADL Goal: Grooming - Progress: Updated due to goal met Miscellaneous OT Goals OT Goal: Miscellaneous Goal #1 - Progress: Progressing toward goals OT Goal: Miscellaneous Goal #3 - Progress: Progressing toward goals  Visit Information  Last OT Received On: 07/09/12 Assistance Needed: +2    Subjective Data  Subjective: Pt currently on vent and trached. Patient Stated Goal: Pt not able to state.      Cognition  Overall Cognitive Status: Impaired Area of Impairment: Attention;Following commands Arousal/Alertness: Awake/alert Behavior During Session: [facility] for tasks performed Current Attention Level: Focused Following Commands: Follows one step commands inconsistently Cognition - Other Comments: Able to follow one step verbal commmnads 70 % of the time pertaining to washing his face, scanning for therapist's fingers and reaching for therapists hand or bed rail.    Mobility  Shoulder Instructions Bed Mobility Bed Mobility: Supine to Sit Rolling Right: 1: +2 Total assist Rolling Right: Patient Percentage: 20% Transfers Transfers: Sit to Stand Sit to Stand: 1: +2 Total assist;Without upper extremity assist;From bed Sit to Stand: Patient Percentage: 10%          Balance Balance Balance Assessed: Yes Static Sitting Balance Static Sitting - Balance Support: Bilateral upper extremity supported;Feet supported Static Sitting - Level of Assistance: 1: +1 Total assist  End of Session OT - End of Session Equipment Utilized During Treatment: Gait belt Activity Tolerance: Patient limited by fatigue Patient left: in chair;with  call bell/phone within reach;with nursing in room Nurse Communication: Need for lift equipment     Alex Foster,JAMESOTR/L Pager number [phone] 07/09/2012, 2:41 PM

## 2012-07-10 DIAGNOSIS — E876 Hypokalemia: Secondary | ICD-10-CM

## 2012-07-10 LAB — BASIC METABOLIC PANEL
BUN: 38 mg/dL — ABNORMAL HIGH (ref 6–23)
CO2: 29 mEq/L (ref 19–32)
Chloride: 102 mEq/L (ref 96–112)
Glucose, Bld: 99 mg/dL (ref 70–99)
Potassium: 3.4 mEq/L — ABNORMAL LOW (ref 3.5–5.1)

## 2012-07-10 LAB — GLUCOSE, CAPILLARY
Glucose-Capillary: 104 mg/dL — ABNORMAL HIGH (ref 70–99)
Glucose-Capillary: 116 mg/dL — ABNORMAL HIGH (ref 70–99)
Glucose-Capillary: 135 mg/dL — ABNORMAL HIGH (ref 70–99)
Glucose-Capillary: 95 mg/dL (ref 70–99)

## 2012-07-10 LAB — CBC
HCT: 27.6 % — ABNORMAL LOW (ref 39.0–52.0)
Hemoglobin: 8.6 g/dL — ABNORMAL LOW (ref 13.0–17.0)
WBC: 5.6 10*3/uL (ref 4.0–10.5)

## 2012-07-10 LAB — PROTIME-INR: INR: 1.96 — ABNORMAL HIGH (ref 0.00–1.49)

## 2012-07-10 MED ORDER — QUETIAPINE FUMARATE 100 MG PO TABS
100.0000 mg | ORAL_TABLET | Freq: Two times a day (BID) | ORAL | Status: DC
  Administered 2012-07-10 – 2012-07-12 (×5): 100 mg
  Filled 2012-07-10 (×7): qty 1

## 2012-07-10 MED ORDER — FENTANYL 25 MCG/HR TD PT72
25.0000 ug | MEDICATED_PATCH | TRANSDERMAL | Status: DC
  Administered 2012-07-10: 25 ug via TRANSDERMAL
  Filled 2012-07-10: qty 1

## 2012-07-10 MED ORDER — GUAIFENESIN 100 MG/5ML PO SOLN
15.0000 mL | ORAL | Status: DC
  Administered 2012-07-10 – 2012-07-15 (×29): 300 mg
  Filled 2012-07-10 (×36): qty 15

## 2012-07-10 MED ORDER — WARFARIN SODIUM 7.5 MG PO TABS
7.5000 mg | ORAL_TABLET | Freq: Once | ORAL | Status: AC
  Administered 2012-07-10: 7.5 mg
  Filled 2012-07-10: qty 1

## 2012-07-10 MED ORDER — POTASSIUM CHLORIDE 20 MEQ/15ML (10%) PO LIQD
20.0000 meq | Freq: Two times a day (BID) | ORAL | Status: DC
  Administered 2012-07-10 – 2012-08-08 (×58): 20 meq
  Filled 2012-07-10 (×64): qty 15

## 2012-07-10 MED ORDER — GUAIFENESIN 100 MG/5ML PO SOLN
20.0000 mL | ORAL | Status: DC
  Filled 2012-07-10 (×6): qty 20

## 2012-07-10 NOTE — Progress Notes (Signed)
Pt placed on CP/PS by MD at 0710

## 2012-07-10 NOTE — Clinical Social Work Note (Signed)
Clinical Social Worker continuing to follow patient for support and discharge planning needs.  Patient remains on the ventilator and requiring that level of care.  Patient has been accepted to [facility] [place], however the facility must have patient bank statements to process patient VA Medicaid application.  CSW spoke with patient brother who has attempted and not been successful to retrieve the statements.  CSW left message with facility to determine the "next step."  CSW added patient again to wait list at [facility].  [facility] states that they may have a discharge next week.  CSW will continue to follow for support and to facilitate patient discharge needs.  Macario Golds, [place] [phone]

## 2012-07-10 NOTE — Plan of Care (Signed)
Problem: Phase III Progression Outcomes Goal: Activity advanced as tolerated Outcome: Progressing Pt tolerating OOB one hour. Sats remain >90% with pressure support. Restless behavior minimized with stimulation and repositioning.
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
## 2012-07-10 NOTE — Progress Notes (Signed)
Patient ID: Alex Foster, male   DOB: [birth year], 66 y.o.   MRN: [id number]   LOS: 40 days   Subjective: Sleeping, on vent.  Objective: Vital signs in last 24 hours: Temp:  [97 F (36.1 C)-98.3 F (36.8 C)] 98 F (36.7 C) (10/09 0321) Pulse Rate:  [74-90] 84  (10/09 0321) Resp:  [16-26] 24  (10/09 0321) BP: (96-121)/(49-64) 121/64 mmHg (10/09 0321) SpO2:  [91 %-99 %] 92 % (10/09 0321) FiO2 (%):  [49.7 %-50.1 %] 50 % (10/09 0321) Weight:  [101.8 kg (224 lb 6.9 oz)] 101.8 kg (224 lb 6.9 oz) (10/09 0400) Last BM Date: 07/09/12  VENT: PRVC/50%/5PEEP/RR22/Vt643ml  UOP: 52ml/h NET: -379ml/24h TOTAL: +120ml/admission   Lab Results  CBC  Basename 07/10/12 0430 07/09/12 0420  WBC 5.6 5.8  HGB 8.6* 9.1*  HCT 27.6* 29.2*  PLT 195 177   BMET  Basename 07/10/12 0430 07/09/12 0420  NA 139 139  K 3.4* 3.6  CL 102 101  CO2 29 30  GLUCOSE 99 134*  BUN 38* 39*  CREATININE 0.95 1.13  CALCIUM 8.6 8.6   CBG (last 3)   Basename 07/10/12 0343 07/09/12 2353 07/09/12 2006  GLUCAP 95 104* 147*    Lab Results  Component Value Date   INR 1.96* 07/10/2012   INR 1.69* 07/09/2012   INR 1.58* 07/08/2012      General appearance: no distress Resp: clear to auscultation bilaterally Cardio: regular rate and rhythm, occ PVC's GI: normal findings: bowel sounds normal and soft, non-tender    Assessment/Plan: [facility] -- Decreased FiO2 to 40% with O2 sats maintained in low 90's. Placed pt on CPAP/PS 5/15 with sats 89% -- 93%. Continue weaning efforts, will allow O2 sats down to 88%. TBI w/ICC -- per NS. Supportive care, OT/PT as tolerated.  CVA -- per NS  Multiple facial fxs -- Non-operative per ENT  Multiple bilateral rib fxs w/HTPX s/p bilateral CT  ABL anemia -- stable  UE DVT -- Lovenox and coumadin per pharmacy  Urinary retention -- foley replaced  Renal insufficiency -- Resolved DM -- Good control  FEN -- TF via PEG. Decrease Duragesic. Decrease Seroquel. Add K+ for mild  hypokalemia Dispo -- vent SNF placement in [place] this week is bed is available  Critical care time: 0700 -- 0730  Freeman Caldron, PA-C Pager: [phone] General Trauma PA Pager: [phone]   07/10/2012

## 2012-07-10 NOTE — Progress Notes (Signed)
Speech Language Pathology Treatment Patient Details Name: Alex Foster MRN: [id number] DOB: [birth year] Today's Date: 07/10/2012 Time: 4098-1191 SLP Time Calculation (min): 17 min  Assessment / Plan / Recommendation Clinical Impression  Pt. seen today for cognitive treatment.  Alex Foster was very alert today with increased interaction with SLP.  He required mild-moderate verbal/visual/tactile cues to follow one step commands with 85% accuracy.  Pt. mouthed numbers 1-10 with verbal/visual cues to initiate "1".  He spontaneously and clearly mouthed "I want water."  SLP provided education regarding his NPO status and explained that swallow function could be assessed once he is a candidate and able to safely utilize a Passy-Muir speaking valve.  He is making great progress toward goals.  Increased restlessness observed during session as his awareness is increasing.  He is on pressure support (40% per RN).  Hopefully, pt. can wean to trach collar for several hour increments for possible PMSV if MD approved.      SLP Plan  Continue with current plan of care       SLP Goals  SLP Goals Potential to Achieve Goals: Good Potential Considerations: Severity of impairments SLP Goal #1: Pt. will sustain attention to activity/speaker for 15 minutes with mod verbal cues. SLP Goal #1 - Progress: Progressing toward goal SLP Goal #2: Pt. will follow 1 step commands with 90% accuracy. SLP Goal #2 - Progress: Progressing toward goal SLP Goal #3: Pt. will increase spatial and situational orientation via yes/no head gestures with 75% with mod environmental cues.  SLP Goal #3 - Progress: Progressing toward goal  General Temperature Spikes Noted: No Respiratory Status: Ventilator (pressure support) Behavior/Cognition: Alert;Cooperative;Requires cueing;Decreased sustained attention;Distractible Oral Cavity - Dentition: Edentulous Patient Positioning: Upright in bed  Oral Cavity - Oral Hygiene Does patient have any  of the following "at risk" factors?: Saliva - thick, dry mouth Brush patient's teeth BID with toothbrush (using toothpaste with fluoride): Yes Patient is HIGH RISK - Oral Care Protocol followed (see row info): Yes   Treatment Treatment focused on: Cognition Skilled Treatment: mild-moderate visual/verbal/tactile cues during functional activities         [employer] M.Ed [employer] [phone]  07/10/2012

## 2012-07-10 NOTE — Progress Notes (Signed)
Orthopedic Tech Progress Note Patient Details:  Alex Foster [birth year] [id number]  Ortho Devices Type of Ortho Device: Abdominal binder Ortho Device/Splint Interventions: Application   Cammer, Mickie Bail 07/10/2012, 3:57 PM

## 2012-07-10 NOTE — Progress Notes (Signed)
Agree with above, a little agitated this am, will need to follow with sedation/narcs reduced for tachypnea/o2 sats, otherwise await placement.

## 2012-07-11 DIAGNOSIS — E876 Hypokalemia: Secondary | ICD-10-CM

## 2012-07-11 DIAGNOSIS — I82409 Acute embolism and thrombosis of unspecified deep veins of unspecified lower extremity: Secondary | ICD-10-CM

## 2012-07-11 DIAGNOSIS — J95821 Acute postprocedural respiratory failure: Secondary | ICD-10-CM

## 2012-07-11 LAB — GLUCOSE, CAPILLARY
Glucose-Capillary: 139 mg/dL — ABNORMAL HIGH (ref 70–99)
Glucose-Capillary: 163 mg/dL — ABNORMAL HIGH (ref 70–99)
Glucose-Capillary: 138 mg/dL — ABNORMAL HIGH (ref 70–99)

## 2012-07-11 LAB — BASIC METABOLIC PANEL
CO2: 27 mEq/L (ref 19–32)
Calcium: 8.9 mg/dL (ref 8.4–10.5)
Creatinine, Ser: 0.83 mg/dL (ref 0.50–1.35)
GFR calc Af Amer: >90 mL/min (ref >90)
Potassium: 4 mEq/L (ref 3.5–5.1)

## 2012-07-11 MED ORDER — WARFARIN SODIUM 7.5 MG PO TABS
7.5000 mg | ORAL_TABLET | Freq: Once | ORAL | Status: AC
  Administered 2012-07-11: 7.5 mg
  Filled 2012-07-11: qty 1

## 2012-07-11 NOTE — Clinical Social Work Note (Signed)
Clinical Social Worker continuing to follow patient and patient family for emotional support and discharge planning needs.  CSW spoke with [facility] in [place], [place] to discuss the possibility of LOG prior to patient being approved for [facility].  Facility sounds favorable at this time after speaking with corporate office.  CSW faxed LOG to facility for review and facility to notify CSW early am of approval.  Facility willing to admit patient tomorrow if all paperwork and transportation arranged by 13:00.  CSW notified patient brother of possible transfer and PA for possible discharge paperwork.  Clinical Social Worker remains available for emotional support and to facilitate patient discharge needs.  Macario Golds, [place] [phone]

## 2012-07-11 NOTE — Progress Notes (Signed)
Physical Therapy Treatment Patient Details Name: Alex Foster MRN: [id number] DOB: March 03, 1946 Today's Date: 07/11/2012 Time: 5784-6962 PT Time Calculation (min): 38 min  PT Assessment / Plan / Recommendation Comments on Treatment Session  Pt with decreased engagement in therapy today & required increased assist for all mobility.      Follow Up Recommendations  Post acute inpatient     Does the patient have the potential to tolerate intense rehabilitation  Yes, Recommend IP Rehab Screening  Barriers to Discharge        Equipment Recommendations  3 in 1 bedside comode    Recommendations for Other Services Rehab consult  Frequency Min 3X/week   Plan Discharge plan remains appropriate;Frequency remains appropriate    Precautions / Restrictions Precautions Precautions: Fall Precaution Comments: LUE shoulder anterior inferior subluxation. Restrictions Weight Bearing Restrictions: No       Mobility  Bed Mobility Bed Mobility: Supine to Sit;Sitting - Scoot to Edge of Bed;Sit to Supine Supine to Sit: 1: +2 Total assist Supine to Sit: Patient Percentage: 0% Sitting - Scoot to Edge of Bed: 1: +1 Total assist Sit to Supine: 1: +2 Total assist Sit to Supine: Patient Percentage: 0% Scooting to HOB: 1: +2 Total assist Scooting to [facility]: Patient Percentage: 0% Details for Bed Mobility Assistance: Pt with no active participation today.  Pt awake but engaging/focusing <10% of time.    Transfers Transfers: Sit to Stand;Stand to Sit Sit to Stand: 1: +2 Total assist;From bed Sit to Stand: Patient Percentage: 10% Stand to Sit: 1: +2 Total assist;To bed Stand to Sit: Patient Percentage: 10% Details for Transfer Assistance: Trialed sit<>stand to encourage weight bearing through LE's & to shift hips towards HOB.  Use of draw pad to lift hips off of bed.  Assist for all components of transition.   Ambulation/Gait Ambulation/Gait Assistance: Not tested (comment)      PT Goals Acute  Rehab PT Goals PT Goal Formulation: Patient unable to participate in goal setting Time For Goal Achievement: 07/18/12 Potential to Achieve Goals: Fair Pt will go Supine/Side to Sit: with max assist PT Goal: Supine/Side to Sit - Progress: Not progressing Pt will Sit at [facility] of Bed: 3-5 min;with mod assist;with unilateral upper extremity support PT Goal: Sit at Edge Of Bed - Progress: Not progressing Pt will go Sit to Stand: with +2 total assist;with upper extremity assist PT Goal: Sit to Stand - Progress: Not progressing Pt will Transfer Bed to Chair/Chair to Bed: with +2 total assist Additional Goals Additional Goal #1: Pt. will participate in pregait activity at EOB with total a +2 (pt=40%)  Visit Information  Last PT Received On: 07/11/12 Assistance Needed: +2    Subjective Data      Cognition  Overall Cognitive Status: Impaired Area of Impairment: Attention;Following commands Arousal/Alertness: Awake/alert Orientation Level: Other (comment) (difficult to assess due to on trach & nonverbal.  ) Current Attention Level: Focused Attention - Other Comments: Pt awake but attention appeared to be decreased today compared to previous PT sessions.  Pt gazing to Rt side & only focusing for 1-2 seconds at time.   Following Commands: Follows one step commands inconsistently Cognition - Other Comments: Pt with decreased ability to follow commands today & engaging in therapy.  Pt with increased Rt side preference today compared to what it has been.  Only scanning eyes to midline when head was rotated to Lt side whereas pt has been able to scan to Lt side in previous sessions.  Balance  Balance Balance Assessed: Yes Static Sitting Balance Static Sitting - Balance Support: Feet supported;No upper extremity supported Static Sitting - Level of Assistance: 1: +2 Total assist Static Sitting - Comment/# of Minutes: Positioned bil UEs in lap due to pt pushes to Lt with R UE.  Pt with no trunk  muscle activation to assist with LOB correction.  Faciliation for sitting midline, scapular retraction, & cervical extension.    End of Session PT - End of Session Equipment Utilized During Treatment: Gait belt;Oxygen Activity Tolerance: Patient limited by fatigue Patient left: in bed;with call bell/phone within reach;with nursing in room Nurse Communication: Mobility status;Need for lift equipment     Verdell Face, [place] [phone] 07/11/2012
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
## 2012-07-12 DIAGNOSIS — E119 Type 2 diabetes mellitus without complications: Secondary | ICD-10-CM

## 2012-07-12 LAB — BASIC METABOLIC PANEL
Calcium: 8.9 mg/dL (ref 8.4–10.5)
GFR calc Af Amer: >90 mL/min (ref >90)
GFR calc non Af Amer: 88 mL/min — ABNORMAL LOW (ref >90)
Glucose, Bld: 112 mg/dL — ABNORMAL HIGH (ref 70–99)
Potassium: 4.4 mEq/L (ref 3.5–5.1)
Sodium: 140 mEq/L (ref 135–145)

## 2012-07-12 LAB — CBC
Hemoglobin: 9.4 g/dL — ABNORMAL LOW (ref 13.0–17.0)
MCH: 26.9 pg (ref 26.0–34.0)
MCHC: 30.4 g/dL (ref 30.0–36.0)
Platelets: 198 10*3/uL (ref 150–400)
RDW: 16 % — ABNORMAL HIGH (ref 11.5–15.5)

## 2012-07-12 LAB — PROTIME-INR
INR: 3.14 — ABNORMAL HIGH (ref 0.00–1.49)
Prothrombin Time: 30.6 seconds — ABNORMAL HIGH (ref 11.6–15.2)

## 2012-07-12 LAB — GLUCOSE, CAPILLARY
Glucose-Capillary: 110 mg/dL — ABNORMAL HIGH (ref 70–99)
Glucose-Capillary: 124 mg/dL — ABNORMAL HIGH (ref 70–99)
Glucose-Capillary: 136 mg/dL — ABNORMAL HIGH (ref 70–99)
Glucose-Capillary: 146 mg/dL — ABNORMAL HIGH (ref 70–99)
Glucose-Capillary: 175 mg/dL — ABNORMAL HIGH (ref 70–99)

## 2012-07-12 MED ORDER — METFORMIN HCL 500 MG PO TABS
500.0000 mg | ORAL_TABLET | Freq: Two times a day (BID) | ORAL | Status: DC
  Administered 2012-07-12 – 2012-08-08 (×49): 500 mg via ORAL
  Filled 2012-07-12 (×58): qty 1

## 2012-07-12 NOTE — Progress Notes (Signed)
Moving LLE some again Working on placement [facility] in the meantime Patient examined and I agree with the assessment and plan  Violeta Gelinas, MD, MPH, FACS Pager: [phone]  07/12/2012 8:10 AM

## 2012-07-12 NOTE — Progress Notes (Signed)
Speech Language Pathology Treatment Patient Details Name: Alex Foster MRN: [id number] DOB: [birth year] Today's Date: 07/12/2012 Time: 2440-1027 SLP Time Calculation (min): 21 min  Assessment / Plan / Recommendation Clinical Impression  Pt. seen following OT session.  Pt. alert and sitting in recliner.  He required moderate cues to sustain attention to speaker and auditory stimulation and for sustained to the left side of his environment.  Pt. with increasing attempts to communicate via mouthing words, requesting water.  Majority of responses were delayed ranging from 3-8 seconds.  He is oriented to hospital via yes/no head gestures.  Pt. would be an excellent candidate for a PMSV from a communicative-linguistic standpoint.  When he is able to tolerate trach collar a PMSV would be beneficial.  Pt. is making excellent progress with [place] ( confused, appropriate) behaviors today.         SLP Plan  Continue with current plan of care       SLP Goals  SLP Goals Potential to Achieve Goals: Good SLP Goal #1: Pt. will sustain attention to activity/speaker for 15 minutes with mod verbal cues. SLP Goal #1 - Progress: Progressing toward goal SLP Goal #2: Pt. will follow 1 step commands with 90% accuracy. SLP Goal #2 - Progress: Progressing toward goal SLP Goal #3: Pt. will increase spatial and situational orientation via yes/no head gestures with 75% with mod environmental cues.  SLP Goal #3 - Progress: Progressing toward goal  General Temperature Spikes Noted: No Respiratory Status: Ventilator Behavior/Cognition: Alert;Cooperative;Pleasant mood;Requires cueing;Decreased sustained attention Oral Cavity - Dentition: Edentulous Patient Positioning: Upright in chair  Oral Cavity - Oral Hygiene Does patient have any of the following "at risk" factors?: Lips - dry, cracked Brush patient's teeth BID with toothbrush (using toothpaste with fluoride): Yes Patient is HIGH RISK - Oral Care Protocol  followed (see row info): Yes Patient is mechanically ventilated, follow VAP prevention protocol for oral care: Oral care provided every 4 hours   Treatment Treatment focused on: Cognition (communication) Skilled Treatment: mild-moderate multimodal cues       Breck Coons [employer] [phone]  07/12/2012

## 2012-07-12 NOTE — Progress Notes (Signed)
Occupational Therapy Treatment Patient Details Name: Parsa Rickett MRN: [id number] DOB: 05-12-1946 Today's Date: 07/12/2012 Time: 1191-4782 OT Time Calculation (min): 34 min  OT Assessment / Plan / Recommendation Comments on Treatment Session Pt making great progress.  Able to initiate functional movement more independently but still needs total assist for sitting balance and bed mobility.  He is able to consistently follow one step commands but does demonstrate a delay with response at times.  Noted severe visual deficts present to the left side and also impairment with occulomotor control with scanning to the left side as well.  Will continue to assess in context of function.      Follow Up Recommendations  Inpatient Rehab       Equipment Recommendations  3 in 1 bedside comode    Recommendations for Other Services Rehab consult  Frequency Min 2X/week   Plan Discharge plan remains appropriate    Precautions / Restrictions Precautions Precautions: Fall Precaution Comments: Left shoulder subluxation and left side hemiparesis. Restrictions Weight Bearing Restrictions: No   Pertinent Vitals/Pain BP 143/75 supine with HOB up, sats 92% on vent, HR 101    ADL  Grooming: Performed;Wash/dry face;Teeth care;Brushing hair;Moderate assistance Where Assessed - Grooming: Supported sitting Toilet Transfer: Simulated;+2 Total assistance;Other (comment) (to bedside chair) Toilet Transfer: Patient Percentage: 20% Toilet Transfer Method: Stand pivot Transfers/Ambulation Related to ADLs: Pt required total +2 (pt 20 % for stand pivot transfer to bedside chair. ADL Comments: Pt able to maintain sustained attention during session.  Consistently following one step commands and nodding head yes/no to questions therapist asked.  Initiated forward trunk flexion for repositioning in the bedside chair with max assist.  Utilized RUE for grooming tasks with overall mod assist to swab his mouth with the  toothete and to comb his hair.  Needed only min assist for washing his face.  Pt unable to locate therapist's hand or pt's glasses placed on the left side of midline.  He was able to identify them on the right side however.        OT Goals ADL Goals ADL Goal: Grooming - Progress: Progressing toward goals Miscellaneous OT Goals OT Goal: Miscellaneous Goal #1 - Progress: Progressing toward goals OT Goal: Miscellaneous Goal #2 - Progress: Progressing toward goals OT Goal: Miscellaneous Goal #3 - Progress: Progressing toward goals  Visit Information  Last OT Received On: 07/12/12 Assistance Needed: +2    Subjective Data  Subjective: Pt on vent but did mouth that he wanted water. Patient Stated Goal: Did not state during session.      Cognition  Overall Cognitive Status: Impaired Area of Impairment: Safety/judgement;Following commands;Problem solving Arousal/Alertness: Awake/alert Following Commands: Follows multi-step commands inconsistently;Follows one step commands consistently Safety/Judgement: Decreased awareness of safety precautions Safety/Judgement - Other Comments: Pt attempting to pull vent off when therapist entered.  Needs use of wrist restraints.    Mobility  Shoulder Instructions Bed Mobility Bed Mobility: Supine to Sit Rolling Right: 1: +1 Total assist Transfers Transfers: Sit to Stand Sit to Stand: 1: +1 Total assist          Balance Balance Balance Assessed: Yes Static Sitting Balance Static Sitting - Balance Support: Right upper extremity supported Static Sitting - Level of Assistance: 1: +1 Total assist   End of Session OT - End of Session Activity Tolerance: Patient tolerated treatment well Patient left: in chair;with family/visitor present;Other (comment) (Speech Therapy in the room) Nurse Communication: Need for lift equipment     Rhea Thrun OTR/L Pager number [phone]  07/12/2012, 9:47 AM
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
## 2012-07-12 NOTE — Clinical Social Work Note (Signed)
Clinical Social Worker continuing to follow patient for emotional support and discharge planning needs.  CSW spoke with facility who was agreeable to patient admission today, however due to transport concerns with [facility], [facility], maintenance, and [employer] there were no available trucks or staff to support the ventilator.  Facility unable to facilitate new weekend admissions and there are no other offers at this time.  Facility willing to admit patient on Monday 10/14.  CSW made arrangements with [facility] transport for Monday 10/14 at 10:00am to transport patient to [facility].  CSW spoke with patient brother over the phone to inform of patient discharge plans - patient brother agreeable at this time.  Patient brother plans to visit on Saturday and will sign the [place] Medicaid application at that time.  Clinical Social Worker remains available for support and to facilitate patient discharge needs on Monday 10/14 to [facility] [place].  Macario Golds, [place] [phone]
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
## 2012-07-13 DIAGNOSIS — S069X9A Unspecified intracranial injury with loss of consciousness of unspecified duration, initial encounter: Secondary | ICD-10-CM

## 2012-07-13 DIAGNOSIS — S069XAA Unspecified intracranial injury with loss of consciousness status unknown, initial encounter: Secondary | ICD-10-CM

## 2012-07-13 LAB — GLUCOSE, CAPILLARY
Glucose-Capillary: 170 mg/dL — ABNORMAL HIGH (ref 70–99)
Glucose-Capillary: 104 mg/dL — ABNORMAL HIGH (ref 70–99)

## 2012-07-13 LAB — PROTIME-INR
INR: 2.52 — ABNORMAL HIGH (ref 0.00–1.49)
Prothrombin Time: 26 seconds — ABNORMAL HIGH (ref 11.6–15.2)

## 2012-07-13 MED ORDER — QUETIAPINE FUMARATE 50 MG PO TABS
50.0000 mg | ORAL_TABLET | Freq: Three times a day (TID) | ORAL | Status: DC
  Administered 2012-07-13 (×3): 50 mg
  Filled 2012-07-13 (×6): qty 1

## 2012-07-13 MED ORDER — WARFARIN SODIUM 5 MG PO TABS
5.0000 mg | ORAL_TABLET | Freq: Once | ORAL | Status: AC
  Administered 2012-07-13: 5 mg via ORAL
  Filled 2012-07-13: qty 1

## 2012-07-13 NOTE — Progress Notes (Signed)
Has remained on [facility] - will try to wean FiO2 May change plan for Monday vent SNF if he is able to stay off the vent. Patient examined and I agree with the assessment and plan  Violeta Gelinas, MD, MPH, FACS Pager: [phone]  07/13/2012 10:46 AM

## 2012-07-13 NOTE — Progress Notes (Signed)
Patient ID: Alex Foster, male   DOB: 1946/08/03, 66 y.o.   MRN: [id number]   LOS: 43 days   Subjective: Stayed on TC all night!!  Objective: Vital signs in last 24 hours: Temp:  [98.2 F (36.8 C)-99.7 F (37.6 C)] 98.4 F (36.9 C) (10/12 0402) Pulse Rate:  [92-105] 98  (10/12 0402) Resp:  [16-34] 22  (10/12 0402) BP: (123-161)/(60-80) 148/80 mmHg (10/12 0402) SpO2:  [91 %-97 %] 93 % (10/12 0402) FiO2 (%):  [50 %-80 %] 80 % (10/12 0402) Weight:  [218 lb 4.1 oz (99 kg)] 218 lb 4.1 oz (99 kg) (10/12 0402) Last BM Date: 07/12/12   UOP: 24ml/h NET: -1341ml/24h TOTAL: -2374ml/admission   Lab Results  CBG (last 3)   Basename 07/13/12 0806 07/13/12 0445 07/12/12 2359  GLUCAP 139* 145* 88   Lab Results  Component Value Date   INR 2.52* 07/13/2012   INR 3.14* 07/12/2012   INR 2.29* 07/11/2012     General appearance: no distress and mildly agitated Resp: clear to auscultation bilaterally Cardio: regular rate and rhythm GI: normal findings: bowel sounds normal and soft, non-tender   Assessment/Plan: [facility]  VDRF -- On TC for ~16h. Will start to try and wean FiO2  TBI w/ICC -- per NS. Supportive care, OT/PT as tolerated.  CVA -- per NS  Multiple facial fxs -- Non-operative per ENT  Multiple bilateral rib fxs w/HTPX s/p bilateral CT  ABL anemia -- stable  UE DVT -- Coumadin Urinary retention -- foley replaced  DM -- Stable FEN -- TF via PEG. D/c fentanyl patch, decrease seroquel  Dispo -- vent SNF placement in [place] on Monday though if stays off vent can find local SNF.    Freeman Caldron, PA-C Pager: [phone] General Trauma PA Pager: [phone]   07/13/2012
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
## 2012-07-13 NOTE — Progress Notes (Signed)
Spoke with Trauma Dr.Thompson about the patient needing his trach changed out.  He said to hold off for today and they will change it out when they come to see him in the morning.

## 2012-07-14 LAB — GLUCOSE, CAPILLARY: Glucose-Capillary: 157 mg/dL — ABNORMAL HIGH (ref 70–99)

## 2012-07-14 MED ORDER — WARFARIN SODIUM 5 MG PO TABS
5.0000 mg | ORAL_TABLET | ORAL | Status: AC
  Administered 2012-07-15: 5 mg via ORAL
  Filled 2012-07-14: qty 1

## 2012-07-14 MED ORDER — WHITE PETROLATUM GEL
Status: AC
  Administered 2012-07-14: 15:00:00
  Filled 2012-07-14: qty 5

## 2012-07-14 MED ORDER — ACETAMINOPHEN-CODEINE #3 300-30 MG PO TABS: 1.0000 | ORAL_TABLET | ORAL | Status: DC | PRN

## 2012-07-14 MED ORDER — QUETIAPINE FUMARATE 50 MG PO TABS
50.0000 mg | ORAL_TABLET | Freq: Two times a day (BID) | ORAL | Status: DC
  Administered 2012-07-14 (×2): 50 mg
  Filled 2012-07-14 (×4): qty 1

## 2012-07-14 MED ORDER — WARFARIN SODIUM 7.5 MG PO TABS
7.5000 mg | ORAL_TABLET | ORAL | Status: DC
  Administered 2012-07-14 – 2012-07-30 (×9): 7.5 mg via ORAL
  Filled 2012-07-14 (×9): qty 1
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
## 2012-07-14 NOTE — Progress Notes (Signed)
Patient ID: Alex Foster, male   DOB: 24-Nov-1945, 66 y.o.   MRN: [id number]   LOS: 44 days   Subjective: Sleeping. Continues on TC.  Objective: Vital signs in last 24 hours: Temp:  [97.1 F (36.2 C)-100 F (37.8 C)] 98.2 F (36.8 C) (10/13 0747) Pulse Rate:  [85-101] 85  (10/13 0747) Resp:  [19-49] 22  (10/13 0747) BP: (105-158)/(58-89) 121/68 mmHg (10/13 0747) SpO2:  [90 %-98 %] 90 % (10/13 0747) FiO2 (%):  [28 %-80 %] 80 % (10/13 0747) Weight:  [216 lb 0.8 oz (98 kg)] 216 lb 0.8 oz (98 kg) (10/13 0500) Last BM Date: 07/13/12  Lab Results:  Lab Results  Component Value Date   INR 2.48* 07/14/2012   INR 2.52* 07/13/2012   INR 3.14* 07/12/2012   CBG (last 3)   Basename 07/14/12 0447 07/14/12 0112 07/13/12 2045  GLUCAP 157* 138* 194*      General appearance: no distress Resp: clear to auscultation bilaterally Cardio: regular rate and rhythm GI: Soft, +BS.   Assessment/Plan: [facility]  Respiratory failure -- Has remained on 80% FiO2. Turned to 60% this am. TBI w/ICC -- per NS. Supportive care, OT/PT as tolerated.  CVA -- per NS  Multiple facial fxs -- Non-operative per ENT  Multiple bilateral rib fxs w/HTPX s/p bilateral CT  ABL anemia -- stable  UE DVT -- Coumadin  Urinary retention -- foley replaced  DM -- Stable  FEN -- TF via PEG. Decrease seroquel.  Dispo -- vent SNF placement in [place] tomorrow vs local SNF if FiO2 can be weaned.    Freeman Caldron, PA-C Pager: [phone] General Trauma PA Pager: [phone]   07/14/2012

## 2012-07-14 NOTE — Progress Notes (Signed)
Water bottle full 

## 2012-07-14 NOTE — Progress Notes (Signed)
Agree with above 

## 2012-07-15 ENCOUNTER — Inpatient Hospital Stay (HOSPITAL_COMMUNITY): Payer: Medicaid Other

## 2012-07-15 LAB — MAGNESIUM: Magnesium: 2 mg/dL (ref 1.5–2.5)

## 2012-07-15 LAB — BASIC METABOLIC PANEL
BUN: 44 mg/dL — ABNORMAL HIGH (ref 6–23)
CO2: 28 mEq/L (ref 19–32)
Calcium: 9 mg/dL (ref 8.4–10.5)
GFR calc non Af Amer: >90 mL/min (ref >90)
Glucose, Bld: 147 mg/dL — ABNORMAL HIGH (ref 70–99)

## 2012-07-15 LAB — GLUCOSE, CAPILLARY
Glucose-Capillary: 138 mg/dL — ABNORMAL HIGH (ref 70–99)
Glucose-Capillary: 143 mg/dL — ABNORMAL HIGH (ref 70–99)
Glucose-Capillary: 140 mg/dL — ABNORMAL HIGH (ref 70–99)
Glucose-Capillary: 140 mg/dL — ABNORMAL HIGH (ref 70–99)

## 2012-07-15 LAB — CBC
HCT: 33 % — ABNORMAL LOW (ref 39.0–52.0)
Hemoglobin: 9.9 g/dL — ABNORMAL LOW (ref 13.0–17.0)
MCH: 26.8 pg (ref 26.0–34.0)
MCHC: 30 g/dL (ref 30.0–36.0)
RBC: 3.69 MIL/uL — ABNORMAL LOW (ref 4.22–5.81)

## 2012-07-15 MED ORDER — ALBUTEROL SULFATE (5 MG/ML) 0.5% IN NEBU
2.5000 mg | INHALATION_SOLUTION | RESPIRATORY_TRACT | Status: DC
  Administered 2012-07-15 – 2012-07-23 (×48): 2.5 mg via RESPIRATORY_TRACT
  Filled 2012-07-15 (×15): qty 0.5
  Filled 2012-07-15: qty 1
  Filled 2012-07-15 (×4): qty 0.5
  Filled 2012-07-15: qty 1
  Filled 2012-07-15 (×15): qty 0.5
  Filled 2012-07-15: qty 1
  Filled 2012-07-15 (×9): qty 0.5

## 2012-07-15 MED ORDER — FREE WATER
220.0000 mL | Status: DC
  Administered 2012-07-15 – 2012-08-05 (×126): 220 mL
  Administered 2012-08-06: 60 mL
  Administered 2012-08-06 – 2012-08-08 (×13): 220 mL

## 2012-07-15 MED ORDER — QUETIAPINE FUMARATE 50 MG PO TABS
50.0000 mg | ORAL_TABLET | Freq: Every day | ORAL | Status: AC
  Administered 2012-07-15: 50 mg
  Filled 2012-07-15: qty 1

## 2012-07-15 MED ORDER — IPRATROPIUM BROMIDE 0.02 % IN SOLN
0.5000 mg | RESPIRATORY_TRACT | Status: DC
  Administered 2012-07-15 – 2012-07-23 (×48): 0.5 mg via RESPIRATORY_TRACT
  Filled 2012-07-15 (×2): qty 2.5
  Filled 2012-07-15: qty 5
  Filled 2012-07-15 (×33): qty 2.5
  Filled 2012-07-15: qty 5
  Filled 2012-07-15 (×3): qty 2.5
  Filled 2012-07-15: qty 5
  Filled 2012-07-15 (×5): qty 2.5

## 2012-07-15 MED ORDER — PIVOT 1.5 CAL PO LIQD
1000.0000 mL | ORAL | Status: DC
  Administered 2012-07-16 – 2012-07-18 (×2): 1000 mL
  Filled 2012-07-15 (×6): qty 1000

## 2012-07-15 NOTE — Progress Notes (Signed)
Pt had 16 beat run of vtach at 1440. BP -151/79. HR-100. O2 sat 85-87%. Charma Igo PA notified. Will order Mag level per telephone order Charma Igo PA. Will continue to monitor pt.

## 2012-07-15 NOTE — Progress Notes (Signed)
Rehab Admissions Coordinator Note:  Patient was screened by Clois Dupes for appropriateness for an Inpatient Acute Rehab Consult. Noted patient weaned from vent and no longer needs vent SNF.  PT noted today that pt is not improving in ability to engage self in therapy session  as well as fatigued or working hard on breathing. He is not currently at a level to be able to tolerate intense inpt rehab. I discussed with SW that SNF rehab most appropriate at this time. Please call me for any questions.  Clois Dupes 07/15/2012, 1:46 PM  I can be reached at [phone].

## 2012-07-15 NOTE — Clinical Social Work Note (Signed)
Clinical Social Worker continuing to follow patient and family for emotional support and discharge planning needs.  Patient was scheduled to transfer to [facility] [place] SNF this am, however was able to move off the ventilator and onto trach collar.  Patient weaning on trach collar at 35% at this time.  PA aware that patient must get to 28% prior to placement.  CSW left message with patient brother to relay information.  Patient brother left CSW a message to pursue facilities in the [place], [place] area.  CSW initiated the search locally and in [place] area.  CSW spoke with a facility in [place] who is to review patient information and notify.  CSW also informed CIR admissions coordinator of patient progress on the ventilator and a prescreen was ordered.  Clinical Social Worker will continue to follow and facilitate patient discharge needs once medically ready.  Macario Golds, [place] [phone]

## 2012-07-15 NOTE — Progress Notes (Signed)
Patient ID: Alex Foster, male   DOB: May 25, 1946, 66 y.o.   MRN: [id number]   LOS: 45 days   Subjective: Calm, +FC.  Objective: Vital signs in last 24 hours: Temp:  [98.2 F (36.8 C)-98.8 F (37.1 C)] 98.2 F (36.8 C) (10/14 0407) Pulse Rate:  [84-105] 97  (10/14 0407) Resp:  [18-39] 24  (10/14 0407) BP: (110-154)/(53-87) 141/69 mmHg (10/14 0407) SpO2:  [89 %-95 %] 90 % (10/14 0407) FiO2 (%):  [40 %-80 %] 40 % (10/14 0407) Weight:  [211 lb 10.3 oz (96 kg)] 211 lb 10.3 oz (96 kg) (10/14 0407) Last BM Date: 07/14/12  Lab Results:  CBC  Basename 07/15/12 0545  WBC 7.8  HGB 9.9*  HCT 33.0*  PLT 225   BMET  Basename 07/15/12 0545  NA 142  K 4.5  CL 106  CO2 28  GLUCOSE 147*  BUN 44*  CREATININE 0.84  CALCIUM 9.0   CBG (last 3)   Basename 07/15/12 0439 07/15/12 0105 07/14/12 2044  GLUCAP 140* 143* 131*   Lab Results  Component Value Date   INR 2.51* 07/15/2012   INR 2.48* 07/14/2012   INR 2.52* 07/13/2012     General appearance: no distress Resp: rhonchi bilaterally Cardio: regular rate and rhythm GI: Soft, NT, +BS.   Assessment/Plan: [facility]  Respiratory failure -- 40% FiO2 since yesterday midday. Will try 35% as I think he needs that for d/c to SNF. Will allow cuff deflation trials. No e/o resp infection, will d/c guaifenisen to decrease secretions, consider Robinul if they continue to be a problem. Will schedule breathing treatments for the short term. TBI w/ICC -- per NS. Supportive care, OT/PT as tolerated.  CVA -- per NS  Multiple facial fxs -- Non-operative per ENT  Multiple bilateral rib fxs w/HTPX s/p bilateral CT  ABL anemia -- stable  UE DVT -- Coumadin  Urinary retention -- foley replaced  DM -- Stable  FEN -- TF via PEG. Decrease seroquel.  Dispo -- vent SNF placement cancelled given his success off vent. Will begin search for local SNF.    Freeman Caldron, PA-C Pager: [phone] General Trauma PA Pager: [phone]    07/15/2012

## 2012-07-15 NOTE — Progress Notes (Signed)
UR completed 

## 2012-07-15 NOTE — Progress Notes (Signed)
Nutrition Follow-up  Intervention:   1.  Enteral nutrition; increased Pivot 1.5 to 45 mL/hr continuous.  Continue Prostat 60 mL TID to provide 2152 kcal, 187g, 786 mL free water (after estimated 1 hr stoppage). 2.  Free water flushes; flush tube with 220 mL free water q 4 hrs.  Assessment:   Pt continues Pivot 1.5 @ 40 mL/hr with Prostat 6 times daily. RD with concern for wt trend which show trending loss, now consistently below admission wt.  Discussed with RN who reports pt is receiving an estimated 23 hrs of TF daily due to stoppage for med administration and during work with various therapists.  Pt has also stopped all IVF and is not ordered any free water flushes.  Pt receiving estimated 1980 kcal, 176g protein.  Consult for RD to adjust TF and rate as necessary originally obtained (10/3).  RD to reassess wt change after pt receives fluid.  Based on degree of wt change, pt may no longer require permissive underfeeding.  Pt currently remains on trach collar with support.  Pt weaning and no longer requires vent SNF.  Diet Order:  NPO  Meds: Scheduled Meds:   . albuterol  2.5 mg Nebulization Q4H  . antiseptic oral rinse  15 mL Mouth Rinse QID  . bacitracin   Topical BID  . chlorhexidine  15 mL Mouth Rinse BID  . clonazePAM  1 mg Per Tube TID  . feeding supplement  60 mL Per Tube TID WC  . insulin aspart  0-15 Units Subcutaneous Q4H  . insulin glargine  45 Units Subcutaneous Daily  . ipratropium  0.5 mg Nebulization Q4H  . metFORMIN  500 mg Oral BID WC  . multivitamin  5 mL Per Tube Daily  . pantoprazole sodium  40 mg Per Tube Q1200  . potassium chloride  20 mEq Per Tube BID  . QUEtiapine  50 mg Per Tube QHS  . sodium chloride  10-40 mL Intracatheter Q12H  . warfarin  5 mg Oral Q48H  . warfarin  7.5 mg Oral Q48H  . Warfarin - Pharmacist Dosing Inpatient   Does not apply q1800  . white petrolatum      . DISCONTD: guaiFENesin  15 mL Per Tube Q4H  . DISCONTD: QUEtiapine  50 mg Per  Tube BID   Continuous Infusions:   . feeding supplement (PIVOT 1.5 CAL) 1,000 mL (07/15/12 0643)   PRN Meds:.acetaminophen (TYLENOL) oral liquid 160 mg/5 mL, bisacodyl, fentaNYL, ondansetron (ZOFRAN) IV, sodium chloride, DISCONTD: acetaminophen-codeine, DISCONTD: albuterol, DISCONTD: ipratropium  Labs:  CMP     Component Value Date/Time   NA 142 07/15/2012 0545   K 4.5 07/15/2012 0545   CL 106 07/15/2012 0545   CO2 28 07/15/2012 0545   GLUCOSE 147* 07/15/2012 0545   BUN 44* 07/15/2012 0545   CREATININE 0.84 07/15/2012 0545   CALCIUM 9.0 07/15/2012 0545   PROT 5.8* 07/08/2012 0450   ALBUMIN 1.9* 07/08/2012 0450   AST 20 07/08/2012 0450   ALT 9 07/08/2012 0450   ALKPHOS 164* 07/08/2012 0450   BILITOT 0.6 07/08/2012 0450   GFRNONAA >90 07/15/2012 0545   GFRAA >90 07/15/2012 0545     Intake/Output Summary (Last 24 hours) at 07/15/12 1120 Last data filed at 07/15/12 1014  Gross per 24 hour  Intake    800 ml  Output   2175 ml  Net  -1375 ml    Weight Status:  211 lbs, trending down  Re-estimated needs:  2340 kcal, </=178g protein  Nutrition Dx:  Inadequate oral intake, ongoing  Monitor:   1. Enteral nutrition; continued tolerance with pt meeting 60-70% of estimated needs and </= 90% of estimated protein needs based on ASPEN guidelines for permissive underfeeding critically ill obese pts. Somewhat met, pt meeting 87% estimated kcal needs and 100% estimated protein needs.  2. Wt/wt change; monitor trends. Ongoing.   Loyce Dys, MS RD LDN Clinical Inpatient Dietitian Pager: [phone] Weekend/After hours pager: [phone]

## 2012-07-15 NOTE — Progress Notes (Signed)
Physical Therapy Treatment Patient Details Name: Alex Foster MRN: [id number] DOB: 05-Mar-1946 Today's Date: 07/15/2012 Time: 1324-4010 PT Time Calculation (min): 29 min  PT Assessment / Plan / Recommendation Comments on Treatment Session  Patient continues to be limited by poor trunk/postural stability.  Pt. does not seem to be improving in ability to engage self in therapy sessions.  Unsure if fatigued today versus working hard on breathing as he was on trach collar and desat at times.      Follow Up Recommendations  Post acute inpatient     Does the patient have the potential to tolerate intense rehabilitation  Yes, Recommend IP Rehab Screening  Barriers to Discharge        Equipment Recommendations  3 in 1 bedside comode    Recommendations for Other Services Rehab consult  Frequency Min 3X/week   Plan Discharge plan remains appropriate;Frequency remains appropriate    Precautions / Restrictions Precautions Precautions: Fall Precaution Comments: L shoulder subluxation and L side hemiparesis. Restrictions Weight Bearing Restrictions: No   Pertinent Vitals/Pain Pt. On 35% trach collar with sat 88-93% on arrival; desat with sitting EOB to 82% therefore incr to 40% trach collar to sit EOB with sat 86-90%; placed back on trach collar after therapy at 35% with sat to 86-90%.  Nursing aware.  No pain.    Mobility  Bed Mobility Bed Mobility: Supine to Sit Rolling Right: Not tested (comment) Rolling Left: Not tested (comment) Supine to Sit: 1: +2 Total assist;HOB elevated Supine to Sit: Patient Percentage: 20% Sitting - Scoot to Edge of Bed: 1: +1 Total assist Sit to Supine: 1: +2 Total assist Sit to Supine: Patient Percentage: 0% Scooting to HOB: 1: +2 Total assist Scooting to [facility]: Patient Percentage: 0% Details for Bed Mobility Assistance: Pt. attempted to pull up using right UE with supine to sit.  cues and assist for technique and sequence.   Transfers Transfers: Not  assessed Details for Transfer Assistance: Pt. not performing well enough to trial standing today.   Ambulation/Gait Ambulation/Gait Assistance: Not tested (comment) Stairs: No Wheelchair Mobility Wheelchair Mobility: No Modified Rankin (Stroke Patients Only) Pre-Morbid Rankin Score: No symptoms Modified Rankin: Severe disability    PT Goals Acute Rehab PT Goals PT Goal: Supine/Side to Sit - Progress: Progressing toward goal PT Goal: Sit at [facility] Bed - Progress: Progressing toward goal Additional Goals PT Goal: Additional Goal #2 - Progress: Progressing toward goal  Visit Information  Last PT Received On: 07/15/12 Assistance Needed: +2 PT/OT Co-Evaluation/Treatment: Yes    Subjective Data  Subjective: Pt. on 35 % trach collar on arrival with sats 88-93%.  Mouths words and answers yes/no questions inconsistently.   Cognition  Overall Cognitive Status: Impaired Area of Impairment: Safety/judgement;Following commands;Problem solving Arousal/Alertness: Awake/alert Behavior During Session: [facility] for tasks performed Current Attention Level: Focused Attention - Other Comments: Pt. gazes right and focuses only seconds at a time.   Following Commands: Follows one step commands inconsistently Safety/Judgement: Decreased awareness of safety precautions;Decreased awareness of need for assistance Cognition - Other Comments: Only scans eyes to midline from right.    Balance  Static Sitting Balance Static Sitting - Balance Support: Right upper extremity supported;Feet supported Static Sitting - Level of Assistance: 1: +1 Total assist Static Sitting - Comment/# of Minutes: Pt. sat a total of 22 minutes at EOB.  Pushes to left with RUE unless UEs are in lap.  Pt. varies in level of assist needed from Tot assist to min assist (only min  assist for 5 seconds at a time with minimal trunk activation noted).  Constant facilitation for sitting midline, scapular retraction and cervical extension.   Took blanket and assisted pt with rotation and anterior tilt stretches with a lot of resistance by patient secondary to tone.    End of Session PT - End of Session Equipment Utilized During Treatment: Oxygen Activity Tolerance: Patient limited by fatigue Patient left: in bed;with call bell/phone within reach Nurse Communication: Mobility status;Need for lift equipment       INGOLD,Hephzibah Strehle 07/15/2012, 10:57 AM  [facility] Acute Rehabilitation [phone] (pager)

## 2012-07-15 NOTE — Progress Notes (Signed)
Occupational Therapy Treatment Patient Details Name: Alex Foster MRN: [id number] DOB: 1946-03-17 Today's Date: 07/15/2012 Time: 8469-6295 OT Time Calculation (min): 31 min  OT Assessment / Plan / Recommendation Comments on Treatment Session Pt not making as much progress as last session; however willingly participating.    Follow Up Recommendations  Inpatient Rehab       Equipment Recommendations  3 in 1 bedside comode       Frequency Min 2X/week   Plan Discharge plan remains appropriate    Precautions / Restrictions Precautions Precautions: Fall Precaution Comments: L shoulder subluxation and L side hemiparesis. Restrictions Weight Bearing Restrictions: No       ADL  Grooming: Performed;Brushing hair;Wash/dry face;Maximal assistance (hand over hand, see below for more comments) Where Assessed - Grooming: Supported sitting ADL Comments: Pt able to maintain sustained attention for about 20 minutes. He did consistently follow one step commands. Pt needed Max A hand over hand to wash his face (mainly moving his face across the washcloth versus movng is RUE) and also Max A hand over hand to comb hair (placed comb in his hand and said what do you do  with this and he mouthed "hell if I know" but then he automatically started to reach up towards his head with me supporting the weight of his arm  combing the left side only, when I did rotate the comb in his hand he then switched to trying to comb his right side). Worked on sitting EOB balance as well with trying to get the paitent to self correct or follow verbal and visual cues to correct balance--pt tends to be a "pusher" to the left when he is seated EOB) . Only once did the patient track past midline to the left (and only with the left eye).      OT Goals ADL Goals ADL Goal: Grooming - Progress: Not progressing (today he did not progress from last session) Miscellaneous OT Goals OT Goal: Miscellaneous Goal #2 - Progress:  Progressing toward goals OT Goal: Miscellaneous Goal #3 - Progress: Progressing toward goals  Visit Information  Last OT Received On: 07/15/12 Assistance Needed: +2 PT/OT Co-Evaluation/Treatment: Yes    Subjective Data  Subjective: Pt mouthed "hello" when I said hello to him      Cognition  Overall Cognitive Status: Impaired Area of Impairment: Safety/judgement;Following commands;Problem solving Arousal/Alertness: Awake/alert Behavior During Session: [facility] for tasks performed Current Attention Level: Focused Attention - Other Comments: Pt. gazes right and focuses only seconds at a time.   Following Commands: Follows one step commands inconsistently Safety/Judgement: Decreased awareness of safety precautions;Decreased awareness of need for assistance Cognition - Other Comments: Only scans eyes to midline from right.    Mobility   Bed Mobility Bed Mobility: Supine to Sit Rolling Right: Not tested (comment) Rolling Left: Not tested (comment) Supine to Sit: 1: +2 Total assist;HOB elevated Supine to Sit: Patient Percentage: 20% Sitting - Scoot to Edge of Bed: 1: +1 Total assist Sit to Supine: 1: +2 Total assist Sit to Supine: Patient Percentage: 0% Scooting to HOB: 1: +2 Total assist Scooting to [facility]: Patient Percentage: 0% Details for Bed Mobility Assistance: Pt. attempted to pull up using right UE with supine to sit.  cues and assist for technique and sequence.   Transfers Details for Transfer Assistance: Pt. not performing well enough to trial standing today.            Balance Static Sitting Balance Static Sitting - Balance Support: Right upper extremity supported;Feet  supported Static Sitting - Level of Assistance: 1: +1 Total assist Static Sitting - Comment/# of Minutes: Pt. sat a total of 22 minutes at EOB.  Pushes to left with RUE unless UEs are in lap.  Pt. varies in level of assist needed from Tot assist to min assist (only min assist for 5 seconds at a time with  minimal trunk activation noted).  Constant facilitation for sitting midline, scapular retraction and cervical extension.  Took blanket and assisted pt with rotation and anterior tilt stretches with a lot of resistance by patient secondary to tone.     End of Session OT - End of Session Equipment Utilized During Treatment:  (None only to EOB) Activity Tolerance: Patient tolerated treatment well Patient left: in bed;with restraints reapplied (right mitt and wrist)       Evette Georges [phone] 07/15/2012, 2:08 PM

## 2012-07-15 NOTE — Progress Notes (Signed)
His sats are marginal off the ventilator at about 85-95%.  Currently his sat is 88%, but the patient looks comfortable.  Concur with cancellation of vent SNF  This patient has been seen and I agree with the findings and treatment plan.  Marta Lamas. Gae Bon, MD, FACS [phone] (pager) [phone] (direct pager) Trauma Surgeon

## 2012-07-15 NOTE — Progress Notes (Signed)
Water bottle 3/4 fulll
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
## 2012-07-16 DIAGNOSIS — S2249XA Multiple fractures of ribs, unspecified side, initial encounter for closed fracture: Secondary | ICD-10-CM

## 2012-07-16 LAB — GLUCOSE, CAPILLARY
Glucose-Capillary: 142 mg/dL — ABNORMAL HIGH (ref 70–99)
Glucose-Capillary: 127 mg/dL — ABNORMAL HIGH (ref 70–99)

## 2012-07-16 MED ORDER — WARFARIN SODIUM 5 MG PO TABS
5.0000 mg | ORAL_TABLET | ORAL | Status: DC
  Administered 2012-07-17 – 2012-07-29 (×7): 5 mg via ORAL
  Filled 2012-07-16 (×8): qty 1

## 2012-07-16 NOTE — Progress Notes (Signed)
Pt discussed in hospital LOS meeting today. 
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
## 2012-07-16 NOTE — Progress Notes (Signed)
Weaning O2.  Working toward SNF.

## 2012-07-16 NOTE — Clinical Social Work Note (Signed)
Clinical Social Worker continuing to follow patient for emotional support and discharge planning needs.  Patient was discussed in long LOS meeting this morning and there was concern for patient continued stay.  Patient remains on trach collar at 40%.  CSW contacted [facility] where patient was accepted while on the ventilator, however they are unable to accept patient due to no longer being on the ventilator.  CSW spoke with [facility] who had a male bed available today, however it was a shared room with a patient who was on precautions and this patient is not on any type of precautions at this time.  [facility] to notify if someone clears from infection or there is a discharge and a clean room is available.  Admissions coordinator, Leandrew Koyanagi, states that there could be a male bed available by next week.  Clinical Social Worker has also faxed patient to [facility] and [facility] per family request for further SNF placement if patient is able to wean to 28% on trach collar.  Patient family prefers patient to be in [place] area if possible.  CSW has been in communication with [facility] in [place] who has their billing office confirming patient Medicaid coverage.  CSW to follow up with facilities and family to address patient plans and facilitate patient discharge needs.  Case discussed with Wandra Mannan, clincial social work Chiropodist to inform with updated information.  All parties involved with patient transfer have been notified of patient inability to discharge today due to outside barriers and patient current medical status.  Macario Golds, [place] [phone]

## 2012-07-17 LAB — PROTIME-INR: INR: 2.39 — ABNORMAL HIGH (ref 0.00–1.49)

## 2012-07-17 LAB — GLUCOSE, CAPILLARY
Glucose-Capillary: 147 mg/dL — ABNORMAL HIGH (ref 70–99)
Glucose-Capillary: 121 mg/dL — ABNORMAL HIGH (ref 70–99)
Glucose-Capillary: 129 mg/dL — ABNORMAL HIGH (ref 70–99)
Glucose-Capillary: 119 mg/dL — ABNORMAL HIGH (ref 70–99)

## 2012-07-17 NOTE — Progress Notes (Signed)
Physical Therapy Treatment Patient Details Name: Alex Foster MRN: [id number] DOB: Dec 16, 1945 Today's Date: 07/17/2012 Time: 4098-1191 PT Time Calculation (min): 26 min  PT Assessment / Plan / Recommendation Comments on Treatment Session  Pt s/p TBI with not a lot of change in status today.  Not sitting EOB as well today as yesterday.      Follow Up Recommendations  Post acute inpatient     Does the patient have the potential to tolerate intense rehabilitation  No, Recommend LTACH  Barriers to Discharge        Equipment Recommendations  None recommended by PT    Recommendations for Other Services    Frequency Min 3X/week   Plan Discharge plan remains appropriate;Frequency remains appropriate    Precautions / Restrictions Precautions Precautions: Fall   Pertinent Vitals/Pain VSS, No pain    Mobility  Bed Mobility Bed Mobility: Rolling Left;Left Sidelying to Sit;Sitting - Scoot to [place] of Bed Rolling Right: Not tested (comment) Rolling Left: 1: +2 Total assist Rolling Left: Patient Percentage: 20% Right Sidelying to Sit: Not tested (comment) Left Sidelying to Sit: 1: +2 Total assist;HOB elevated Left Sidelying to Sit: Patient Percentage: 10% Supine to Sit: Not tested (comment) Sitting - Scoot to Edge of Bed: 1: +1 Total assist Sit to Supine: 1: +2 Total assist Sit to Supine: Patient Percentage: 0% Scooting to HOB: 1: +2 Total assist Scooting to [facility]: Patient Percentage: 0% Details for Bed Mobility Assistance: Pt. does not follow commands to initiate any movement.  Resists movement at times as well. Transfers Transfers: Not assessed Ambulation/Gait Ambulation/Gait Assistance: Not tested (comment) Stairs: No Wheelchair Mobility Wheelchair Mobility: No    PT Goals Acute Rehab PT Goals PT Goal: Supine/Side to Sit - Progress: Progressing toward goal PT Goal: Sit at [facility] Of Bed - Progress: Progressing toward goal Additional Goals PT Goal: Additional Goal #2 -  Progress: Progressing toward goal  Visit Information  Last PT Received On: 07/17/12 Assistance Needed: +2    Subjective Data  Subjective: Pt. on 35 % trach collar on arrival with sats 90-93%.  Mouths words and answers yes/no questions inconsistently.   Cognition  Overall Cognitive Status: Impaired Area of Impairment: Safety/judgement;Following commands;Problem solving Arousal/Alertness: Awake/alert Behavior During Session: [facility] for tasks performed Current Attention Level: Focused Following Commands: Follows one step commands inconsistently Safety/Judgement: Decreased awareness of safety precautions;Decreased awareness of need for assistance    Balance  Static Sitting Balance Static Sitting - Balance Support: Right upper extremity supported;Feet supported Static Sitting - Level of Assistance: 1: +1 Total assist Static Sitting - Comment/# of Minutes: Pt. sat a total of 17 minutes at EOB.  Continues to push to left with RUE unless UEs are in lap.  Pt. varies in level of assist needed from tot assist to min guard assist (for 8-15 seconds at a time for up to 8 attempts).  Pt. had more difficulty achieving midline today.  Constant facilitation ffor siting midline, scapular retraction, and cervical extension.    End of Session PT - End of Session Equipment Utilized During Treatment: Oxygen Activity Tolerance: Patient limited by fatigue Patient left: in bed;with call bell/phone within reach Nurse Communication: Mobility status;Need for lift equipment       Foster,Alex Crabtree 07/17/2012, 12:11 PM  [facility] [phone] (pager)
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
## 2012-07-17 NOTE — Progress Notes (Signed)
Passy-Muir Speaking Valve - Treatment Patient Details  Name: Alex Foster MRN: [id number] Date of Birth: 1946-03-03  Today's Date: 07/17/2012 Time: 1105-1130 SLP Time Calculation (min): 25 min  Past Medical History:  Past Medical History  Diagnosis Date  . Myocardial infarction   . Hypertension   . Diabetes mellitus    Past Surgical History: History reviewed. No pertinent past surgical history.  Assessment / Plan / Recommendation Clinical Impression  Pt. seen for cognition-communication with focus on usw of passy-muir speaking valve.  Pt. tolerated valve for approximatly 15-20 minutes with improved vital signs today (Spo2 92%).  He needed total verbal/visual/tacitle cues for deep inhalation prior to speaking 1-2 words.  SLP cue pt. to take a deep breath and applied moderately firm pressure on diaphragm to increase vocal intensity and duration.  Speech intelligibility in  words and phrases was approximately 30-40%.  Pt. not oriented to place.  He was able to follow 1 step directions with 90% accuracy.  Recommend continued use of PMSV with ST only.  Pt. has exhibited great progress this week.    Plan  Continue with current plan of care    Follow Up Recommendations  Skilled Nursing facility         SLP Goals SLP Goal #1 - Progress: Progressing toward goal SLP Goal #2 - Progress: Met SLP Goal #3 - Progress: Progressing toward goal   PMSV Trial  PMSV was placed for: 15-20 minutes Able to redirect subglottic air through upper airway: Yes Able to Attain Phonation: Yes Voice Quality: Low vocal intensity;Breathy Able to Expectorate Secretions: No Intelligibility: Intelligibility reduced Word: 50-74% accurate Phrase: 0-24% accurate Respirations During Trial: 28  SpO2 During Trial: 92 % Pulse During Trial: 83  Behavior: Confused;Alert;Smiling   Tracheostomy Tube       Vent Dependency  FiO2 (%): 40 %    Cuff Deflation Trial      Alex Foster M.Ed  [employer] [phone]  07/17/2012

## 2012-07-17 NOTE — Progress Notes (Signed)
Subjective: Pt with little change overnight.  Doing well working with PT.  Pt still requiring 40% FiO2 via TC.  Dispo pending.  Objective: Vital signs in last 24 hours: Temp:  [98.1 F (36.7 C)-99.3 F (37.4 C)] 98.7 F (37.1 C) (10/16 0734) Pulse Rate:  [82-97] 91  (10/16 0807) Resp:  [14-35] 27  (10/16 0807) BP: (108-149)/(61-97) 149/83 mmHg (10/16 0807) SpO2:  [89 %-95 %] 91 % (10/16 0815) FiO2 (%):  [40 %-60 %] 40 % (10/16 0815) Last BM Date: 07/17/12  Intake/Output from previous day: 10/15 0701 - 10/16 0700 In: 1115 [NG/GT:690] Out: 1850 [Urine:1850] Intake/Output this shift:    General appearance: alert and cooperative Resp: clear to auscultation bilaterally Cardio: regular rate and rhythm, S1, S2 normal, no murmur, click, rub or gallop GI: soft, non-tender; bowel sounds normal; no masses,  no organomegaly  Lab Results:   [facility] 07/15/12 0545  WBC 7.8  HGB 9.9*  HCT 33.0*  PLT 225   BMET  Basename 07/15/12 0545  NA 142  K 4.5  CL 106  CO2 28  GLUCOSE 147*  BUN 44*  CREATININE 0.84  CALCIUM 9.0   PT/INR  Basename 07/17/12 0430 07/15/12 0545  LABPROT 25.0* 25.9*  INR 2.39* 2.51*   ABG No results found for this basename: PHART:2,PCO2:2,PO2:2,HCO3:2 in the last 72 hours  Studies/Results: No results found.  Anti-infectives: Anti-infectives     Start     Dose/Rate Route Frequency Ordered Stop   06/24/12 1800   levofloxacin (LEVAQUIN) IVPB 750 mg        750 mg 100 mL/hr over 90 Minutes Intravenous Every 24 hours 06/24/12 1539 07/07/12 2015   06/23/12 1000   fluconazole (DIFLUCAN) IVPB 100 mg  Status:  Discontinued        100 mg 50 mL/hr over 60 Minutes Intravenous Every 24 hours 06/22/12 1632 07/01/12 0809   06/23/12 0000   vancomycin (VANCOCIN) 1,500 mg in sodium chloride 0.9 % 500 mL IVPB  Status:  Discontinued        1,500 mg 250 mL/hr over 120 Minutes Intravenous Every 24 hours 06/22/12 1145 06/24/12 1539   06/22/12 1730    fluconazole (DIFLUCAN) IVPB 200 mg        200 mg 100 mL/hr over 60 Minutes Intravenous  Once 06/22/12 1630 06/22/12 1852   06/19/12 2200   vancomycin (VANCOCIN) IVPB 1000 mg/200 mL premix  Status:  Discontinued        1,000 mg 200 mL/hr over 60 Minutes Intravenous Every 12 hours 06/19/12 0940 06/22/12 1123   06/19/12 1000   vancomycin (VANCOCIN) 2,000 mg in sodium chloride 0.9 % 500 mL IVPB        2,000 mg 250 mL/hr over 120 Minutes Intravenous  Once 06/19/12 0940 06/19/12 1300   06/19/12 0930   ceFEPIme (MAXIPIME) 2 g in dextrose 5 % 50 mL IVPB  Status:  Discontinued        2 g 100 mL/hr over 30 Minutes Intravenous 3 times per day 06/19/12 0904 06/24/12 1539   06/04/12 1000   vancomycin (VANCOCIN) IVPB 1000 mg/200 mL premix  Status:  Discontinued        1,000 mg 200 mL/hr over 60 Minutes Intravenous Every 12 hours 06/04/12 0837 06/05/12 1045   06/03/12 1800   vancomycin (VANCOCIN) 1,500 mg in sodium chloride 0.9 % 500 mL IVPB  Status:  Discontinued        1,500 mg 250 mL/hr over 120 Minutes Intravenous Every 24 hours  06/02/12 1428 06/04/12 0837   06/02/12 1800   vancomycin (VANCOCIN) 2,000 mg in sodium chloride 0.9 % 500 mL IVPB        2,000 mg 250 mL/hr over 120 Minutes Intravenous  Once 06/02/12 1428 06/02/12 2024   06/02/12 1400   cefTAZidime (FORTAZ) 1 g in dextrose 5 % 50 mL IVPB        1 g 100 mL/hr over 30 Minutes Intravenous 3 times per day 06/02/12 1349 06/12/12 0642          Assessment/Plan: s/p Procedure(s) (LRB) with comments: PERCUTANEOUS TRACHEOSTOMY (N/A) PERCUTANEOUS ENDOSCOPIC GASTROSTOMY (PEG) PLACEMENT (N/A)   [facility]  Respiratory failure -- 40% FiO2 still required today with Sats in the low 90s.  Con't scheduled breathing treatments for the short term. TBI w/ICC -- per NS. Supportive care, OT/PT as tolerated.  CVA -- per NS  Multiple facial fxs -- Non-operative per ENT  Multiple bilateral rib fxs w/HTPX s/p bilateral CT  ABL anemia -- stable  UE DVT  -- Coumadin, INR 2.51 yesterday Urinary retention -- foley replaced  DM -- Stable  FEN -- TF via PEG.  Dispo -- SNF pending  LOS: 47 days    Alex Foster., [name] 07/17/2012

## 2012-07-18 DIAGNOSIS — R0902 Hypoxemia: Secondary | ICD-10-CM

## 2012-07-18 LAB — GLUCOSE, CAPILLARY
Glucose-Capillary: 135 mg/dL — ABNORMAL HIGH (ref 70–99)
Glucose-Capillary: 110 mg/dL — ABNORMAL HIGH (ref 70–99)

## 2012-07-18 MED ORDER — CLONAZEPAM 0.1 MG/ML ORAL SUSPENSION: 0.5000 mg | Freq: Every day | ORAL | Status: DC

## 2012-07-18 MED ORDER — CLONAZEPAM 0.1 MG/ML ORAL SUSPENSION
0.5000 mg | Freq: Every day | ORAL | Status: DC
  Administered 2012-07-19: 0.5 mg
  Filled 2012-07-18: qty 5

## 2012-07-18 MED ORDER — CLONAZEPAM 0.5 MG PO TABS
1.0000 mg | ORAL_TABLET | Freq: Two times a day (BID) | ORAL | Status: DC
  Administered 2012-07-18: 1 mg
  Filled 2012-07-18 (×2): qty 2

## 2012-07-18 NOTE — Progress Notes (Signed)
Trauma Service Note  Subjective: No new issues.  Still trying to wean FIO2  Objective: Vital signs in last 24 hours: Temp:  [98.4 F (36.9 C)-98.7 F (37.1 C)] 98.4 F (36.9 C) (10/17 0737) Pulse Rate:  [79-95] 81  (10/17 0750) Resp:  [17-31] 19  (10/17 0750) BP: (106-150)/(63-90) 106/68 mmHg (10/17 0737) SpO2:  [90 %-96 %] 94 % (10/17 0750) FiO2 (%):  [40 %] 40 % (10/17 0747) Weight:  [95.9 kg (211 lb 6.7 oz)] 95.9 kg (211 lb 6.7 oz) (10/17 0400) Last BM Date: 07/17/12  Intake/Output from previous day: 10/16 0701 - 10/17 0700 In: 1245 [NG/GT:660] Out: 1250 [Urine:1250] Intake/Output this shift:    General: No distress.  Very calm this morning.  Lungs: Clear, shallow excursions, oxygne saturations 90-93% on FIO2 40%  Abd: Soft, tolerating tube feedings well.  Extremities: No changes  Neuro: Intermittently agitated, currently calm and sedated.  Lab Results: CBC  No results found for this basename: WBC:2,HGB:2,HCT:2,PLT:2 in the last 72 hours BMET No results found for this basename: NA:2,K:2,CL:2,CO2:2,GLUCOSE:2,BUN:2,CREATININE:2,CALCIUM:2 in the last 72 hours PT/INR  Basename 07/17/12 0430  LABPROT 25.0*  INR 2.39*    Studies/Results: Will chaeck sputum culture and CBC with diff tomorrow  Anti-infectives: No antibiotics at this time.  Assessment/Plan: s/p Procedure(s): PERCUTANEOUS TRACHEOSTOMY PERCUTANEOUS ENDOSCOPIC GASTROSTOMY (PEG) PLACEMENT Try to wean FIO2 to 35% accepting sats as low as 86% Recheck sputum culture and CBC CXR tomorrow AM  LOS: 48 days   Marta Lamas. Gae Bon, MD, FACS [phone] Trauma Surgeon 07/18/2012

## 2012-07-18 NOTE — Progress Notes (Signed)
Pt placed back on 40% due to desat 82-83%

## 2012-07-18 NOTE — Progress Notes (Signed)
Pt discussed in hospital LOS meeting today. 

## 2012-07-18 NOTE — Progress Notes (Signed)
[facility]y-[facility] Speaking Valve - Treatment Patient Details  Name: Alex Foster MRN: [id number] Date of Birth: 11-May-1946  Today's Date: 07/18/2012 Time: 1020-1040 SLP Time Calculation (min): 20 min  Past Medical History:  Past Medical History  Diagnosis Date  . Myocardial infarction   . Hypertension   . Diabetes mellitus    Past Surgical History: History reviewed. No pertinent past surgical history.  Assessment / Plan / Recommendation Clinical Impression  Treatment focused on verbal communication using PMSV.  Pt. with increased audible laryngeal/tracheal secretions with continued max verbal cues to cough.  Pt. eventually expectorated large amount of mucous from trach although unable to completely clear resulting in decreased ability to phonate.  Pt.'s limited verbalizations with increased wet vocal quality with decreased respiratory support decreasing overall intelligibility.  Decreased cogntion today with increased restlessness and requiring max verbal/visual cues to attend to speaker.  Pt. perseverative on needing to use "the bathroom."           Plan  Continue with current plan of care    Follow Up Recommendations  Skilled Nursing facility         SLP Goals Potential to Achieve Goals: Good SLP Goal #1: Pt. will verbalize in 3-4 word phrases with deep inhalation with max verbal/visual/tactile cues with vital signs stable. SLP Goal #1 - Progress: Progressing toward goal SLP Goal #3 - Progress: Progressing toward goal   PMSV Trial  PMSV was placed for: 10-15 Able to redirect subglottic air through upper airway: Yes Able to Attain Phonation: Yes Voice Quality: Low vocal intensity;Breathy Able to Expectorate Secretions: Yes Level of Secretion Expectoration with PMSV: Tracheal Breath Support for Phonation: Severely decreased Intelligibility: Intelligibility reduced Word: 25-49% accurate Phrase: 0-24% accurate Respirations During Trial: 28  SpO2 During Trial: 93 % Pulse  During Trial: 90  Behavior: Alert;Confused (restless)   Tracheostomy Tube       Vent Dependency  FiO2 (%): 40 %    Cuff Deflation Trial       Cuff Deflation Trial - Comments:  (cuff remains deflated)   Royce Macadamia M.Ed [employer] [phone]  07/18/2012
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
## 2012-07-19 ENCOUNTER — Inpatient Hospital Stay (HOSPITAL_COMMUNITY): Payer: Medicaid Other

## 2012-07-19 LAB — CBC WITH DIFFERENTIAL/PLATELET
Basophils Relative: 1 % (ref 0–1)
Eosinophils Absolute: 0.2 10*3/uL (ref 0.0–0.7)
Lymphs Abs: 2.5 10*3/uL (ref 0.7–4.0)
MCH: 27 pg (ref 26.0–34.0)
MCHC: 30.9 g/dL (ref 30.0–36.0)
Neutro Abs: 5.3 10*3/uL (ref 1.7–7.7)
Neutrophils Relative %: 60 % (ref 43–77)
Platelets: 257 10*3/uL (ref 150–400)
RBC: 3.74 MIL/uL — ABNORMAL LOW (ref 4.22–5.81)

## 2012-07-19 LAB — GLUCOSE, CAPILLARY

## 2012-07-19 LAB — PROTIME-INR
INR: 2.2 — ABNORMAL HIGH (ref 0.00–1.49)
Prothrombin Time: 23.5 seconds — ABNORMAL HIGH (ref 11.6–15.2)

## 2012-07-19 MED ORDER — FENTANYL CITRATE 0.05 MG/ML IJ SOLN
50.0000 ug | Freq: Once | INTRAMUSCULAR | Status: AC
  Administered 2012-07-19: 50 ug via INTRAVENOUS

## 2012-07-19 MED ORDER — GUAIFENESIN 100 MG/5ML PO SYRP
400.0000 mg | ORAL_SOLUTION | ORAL | Status: DC
  Filled 2012-07-19 (×6): qty 118

## 2012-07-19 MED ORDER — CLONAZEPAM 0.1 MG/ML ORAL SUSPENSION
0.5000 mg | Freq: Two times a day (BID) | ORAL | Status: DC
  Filled 2012-07-19: qty 5

## 2012-07-19 MED ORDER — PIVOT 1.5 CAL PO LIQD
1000.0000 mL | ORAL | Status: DC
  Administered 2012-07-19 – 2012-07-23 (×3): 1000 mL
  Filled 2012-07-19 (×12): qty 1000

## 2012-07-19 MED ORDER — PRO-STAT SUGAR FREE PO LIQD
30.0000 mL | Freq: Four times a day (QID) | ORAL | Status: DC
  Administered 2012-07-19 – 2012-07-26 (×26): 30 mL
  Filled 2012-07-19 (×32): qty 30

## 2012-07-19 MED ORDER — MIDAZOLAM HCL 2 MG/2ML IJ SOLN
INTRAMUSCULAR | Status: AC
  Filled 2012-07-19: qty 4

## 2012-07-19 MED ORDER — GUAIFENESIN 100 MG/5ML PO SOLN
20.0000 mL | ORAL | Status: DC
  Administered 2012-07-19 – 2012-08-08 (×113): 400 mg
  Filled 2012-07-19 (×136): qty 20

## 2012-07-19 MED ORDER — CLONAZEPAM 0.5 MG PO TABS
1.0000 mg | ORAL_TABLET | Freq: Every day | ORAL | Status: DC
  Administered 2012-07-19 – 2012-07-24 (×6): 1 mg
  Filled 2012-07-19: qty 2
  Filled 2012-07-19: qty 1
  Filled 2012-07-19 (×2): qty 2
  Filled 2012-07-19: qty 1
  Filled 2012-07-19: qty 2
  Filled 2012-07-19: qty 1
  Filled 2012-07-19: qty 2

## 2012-07-19 MED ORDER — MIDAZOLAM HCL 2 MG/2ML IJ SOLN
2.0000 mg | Freq: Once | INTRAMUSCULAR | Status: AC
  Administered 2012-07-19: 2 mg via INTRAVENOUS

## 2012-07-19 MED ORDER — CLONAZEPAM 0.5 MG PO TABS
0.5000 mg | ORAL_TABLET | Freq: Two times a day (BID) | ORAL | Status: DC
  Administered 2012-07-19 – 2012-07-25 (×11): 0.5 mg via ORAL
  Filled 2012-07-19 (×11): qty 1

## 2012-07-19 NOTE — Progress Notes (Signed)
UR completed 

## 2012-07-19 NOTE — Plan of Care (Signed)
Problem: Phase II Progression Outcomes Goal: Progress activities as ordered Outcome: Progressing Sats maintained > 90%. Pt tolerated OOB in chair 2 hours with supervision. Copious secretions.

## 2012-07-19 NOTE — Progress Notes (Signed)
Occupational Therapy Treatment Patient Details Name: Alex Foster MRN: [id number] DOB: Jan 22, 1946 Today's Date: 07/19/2012 Time: 4098-1191 OT Time Calculation (min): 43 min  OT Assessment / Plan / Recommendation Comments on Treatment Session Pt making small gains in attention, following commands, and balance.  Still needs total assistance +2 (pt 25%) for transfers and mod to total assist for sitting balance.  He is able to follow one step verbal instructional commands consistently. Recommend continued OT.    Follow Up Recommendations  Skilled nursing facility          Recommendations for Other Services Rehab consult  Frequency Min 2X/week   Plan Discharge plan remains appropriate    Precautions / Restrictions Precautions Precautions: Fall Restrictions Weight Bearing Restrictions: No   Pertinent Vitals/Pain O2 sats greater than 91% on 60% trach collar during session    ADL  Grooming: Performed;Wash/dry face;Teeth care;Brushing hair Where Assessed - Grooming: Supported sitting Upper Body Bathing: Moderate assistance Transfers/Ambulation Related to ADLs: Pt required total +2 (pt 25 %) for stand pivot transfer back to chair. ADL Comments: Pt able to sit EOB for 20 mins statically with variable total to mod assist.  Performed 3 grooming tasks with consistent one step instructional cueing to initiate and mod physical assist to complete, using the RUE.  In sitting pt with decreased iniitation to correct LOB but when given an object to reach for he was able to iniitate self-correction with his abdominal  muscles.  He was able to maintain sustained attention at EOB ith no more than min instructional cueing.  Consistently followed 1 step instructional verbal commands as well.  Still does not scan past left of midline., however.       OT Goals Acute Rehab OT Goals OT Goal Formulation: With patient Time For Goal Achievement: 08/02/12 Potential to Achieve Goals: Good ADL Goals Pt Will  Perform Grooming: with supervision;Sitting, edge of bed;Unsupported ADL Goal: Grooming - Progress: Revised due to lack of progress Pt Will Perform Upper Body Bathing: with min assist;Unsupported;Sitting, edge of bed ADL Goal: Upper Body Bathing - Progress: Goal set today Miscellaneous OT Goals Miscellaneous OT Goal #1: Pt will follow 2 step commands related to grooming or selfcare with 75% accurracy. OT Goal: Miscellaneous Goal #1 - Progress: Revised due to lack of progress Miscellaneous OT Goal #2: Pt will transition from supine to sit EOB with mod assist in preparation for selfcare tasks. OT Goal: Miscellaneous Goal #2 - Progress: Revised due to lack of progress Miscellaneous OT Goal #3: Pt will maintain static sitting EOB with min assist for 1 minute. OT Goal: Miscellaneous Goal #3 - Progress: Revised due to lack of progress  Visit Information  Last OT Received On: 07/19/12 Assistance Needed: +2    Subjective Data  Subjective: Pt mouthed "water", requesting water. Patient Stated Goal: Did not state but agreeable to work with therapy.      Cognition  Overall Cognitive Status: Impaired Area of Impairment: Memory;Following commands;Problem solving;Attention Arousal/Alertness: Awake/alert Orientation Level: Disoriented to;Place;Time;Situation Behavior During Session: Flat affect Current Attention Level: Sustained Following Commands: Follows one step commands consistently;Follows multi-step commands inconsistently Safety/Judgement: Decreased awareness of safety precautions;Decreased safety judgement for tasks assessed Safety/Judgement - Other Comments: Attempting to pull at rectal tube. Cognition - Other Comments: Pt following simple 1 step commands fairly consistently today.  Performed various tasks while sitting EOB requring cues & (A) to initiate movement to carry out tasks.      Mobility  Shoulder Instructions Bed Mobility Bed Mobility: Rolling Left;Left Sidelying  to Sit;Sitting  - Scoot to Edge of Bed Rolling Left: 1: +2 Total assist;With rail Rolling Left: Patient Percentage: 10% Left Sidelying to Sit: 1: +2 Total assist;HOB elevated Left Sidelying to Sit: Patient Percentage: 0% Details for Bed Mobility Assistance: (A) for all components of transitioning, however pt able to grab hold of rail with R UE with (A) to place on rail.   Transfers Sit to Stand: 1: +2 Total assist;From bed Sit to Stand: Patient Percentage: 20% Stand to Sit: 1: +2 Total assist;To chair/3-in-1;With upper extremity assist Stand to Sit: Patient Percentage: 20% Details for Transfer Assistance: Pt able to activate LE's to (A) with standing, able to attempt LE advancement during pivot, & reaching with R UE to armrest of recliner.            Balance Balance Balance Assessed: Yes Static Sitting Balance Static Sitting - Balance Support: Right upper extremity supported;Feet supported Static Sitting - Level of Assistance: 2: Max assist Static Sitting - Comment/# of Minutes: Pt's balance fluctuated between total assist and mod assist during grooming tasks. Dynamic Sitting Balance Dynamic Sitting - Balance Support: Feet supported;No upper extremity supported Dynamic Sitting - Level of Assistance: 2: Max assist Dynamic Sitting - Comments: Performed forward/backwards weight shifting, lateral weight shifting, reaching for objects.     End of Session OT - End of Session Activity Tolerance: Patient tolerated treatment well Patient left: in chair;with call bell/phone within reach;with nursing in room Nurse Communication: Need for lift equipment     Velinda Wrobel OTR/L Pager number [phone] 07/19/2012, 4:24 PM

## 2012-07-19 NOTE — Progress Notes (Signed)
PT Cancellation Note  Patient Details Name: Alex Foster MRN: [id number] DOB: 04/18/1946   Cancelled Treatment:     Bronchoscopy being performed.      Verdell Face, [place] [phone] 07/19/2012

## 2012-07-19 NOTE — Procedures (Signed)
Bronchoscopy Procedure Note Alex Foster [id number] Jun 29, 1946  Procedure: Bronchoscopy Indications: Diagnostic evaluation of the airways, Obtain specimens for culture and/or other diagnostic studies and Remove secretions  Procedure Details Consent: Unable to obtain consent because of altered level of consciousness. and respiratory decompensation Time Out: Verified patient identification, verified procedure, site/side was marked, verified correct patient position, special equipment/implants available, medications/allergies/relevent history reviewed, required imaging and test results available.  Performed  In preparation for procedure, patient was given 100% FiO2 and bronchoscope lubricated. Sedation: Benzodiazepines and short acting narcotics  Airway entered and the following bronchi were examined: RUL, RML, RLL, LUL, LLL and Bronchi.   Procedures performed: Brushings performed Bronchoscope removed.  , patient returned to FIO2 prior to examination and procedure.  Evaluation Hemodynamic Status: BP stable throughout; O2 sats: transiently fell during during procedure Patient's Current Condition: stable Specimens:  Sent purulent fluid and bronchoscopic BAL sent Complications: No apparent complications Patient did tolerate procedure well.   Cherylynn Ridges 07/19/2012
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
## 2012-07-19 NOTE — Progress Notes (Signed)
Patient ID: Alex Foster, male   DOB: 12-02-1945, 66 y.o.   MRN: [id number]   LOS: 49 days   Subjective: Says he's feeling fine.  Objective: Vital signs in last 24 hours: Temp:  [97.9 F (36.6 C)-98.9 F (37.2 C)] 97.9 F (36.6 C) (10/18 0500) Pulse Rate:  [80-100] 100  (10/18 0843) Resp:  [19-31] 26  (10/18 0843) BP: (123-151)/(64-98) 134/69 mmHg (10/18 0843) SpO2:  [9 %-94 %] 92 % (10/18 0843) FiO2 (%):  [35 %-98 %] 98 % (10/18 0843) Weight:  [208 lb 5.4 oz (94.5 kg)] 208 lb 5.4 oz (94.5 kg) (10/18 0438) Last BM Date: 07/18/12  Lab Results:  CBC  Basename 07/19/12 0430  WBC 8.7  HGB 10.1*  HCT 32.7*  PLT 257   Lab Results  Component Value Date   INR 2.20* 07/19/2012   INR 2.39* 07/17/2012   INR 2.51* 07/15/2012   CBG (last 3)   Basename 07/19/12 0353 07/18/12 2359 07/18/12 1933  GLUCAP 107* 109* 141*     Radiology PORTABLE CHEST - 1 VIEW  Comparison: 07/15/2012; 07/05/2012; 06/18/2012; chest CT -  05/31/2012  Findings: Grossly unchanged enlarged cardiac silhouette and  mediastinal contours given patient rotation to the left. Stable  position of support apparatus. Unchanged consolidative opacities  in the medial aspect of the left lung apex. Worsening left mid and  lower lung heterogeneous / consolidative opacities. Query small  left-sided effusion. Improved aeration of the right lung base with  persistent right lateral chest wall pleural parenchymal thickening.  No definite pneumothorax. Grossly unchanged bones including  multiple displaced right-sided rib fractures.  IMPRESSION:  1. Stable positioning of support apparatus. No pneumothorax.  2. Worsening left-sided consolidative opacities favored to  represent a combination of air space disease and atelectasis.  3. Minimally improved aeration of the right lower lung.  Original Report Authenticated By: Waynard Reeds, M.D.    General appearance: no distress Resp: rhonchi bilaterally Cardio: regular  rate and rhythm GI: normal findings: bowel sounds normal and soft, non-tender   Assessment/Plan: [facility]  Respiratory failure -- s/p bronch today, cultures sent. Will add guaifenisen back as some e/o mucus plugging. TBI w/ICC -- per NS. Supportive care, OT/PT as tolerated.  CVA -- per NS  Multiple facial fxs -- Non-operative per ENT  Multiple bilateral rib fxs w/HTPX s/p bilateral CT  ABL anemia -- stable  UE DVT -- Coumadin  Urinary retention -- foley replaced  DM -- Stable  FEN -- TF via PEG. Slowly wean Klonopin.  Dispo -- SNF once FiO2 weaned to acceptable levels.    Freeman Caldron, PA-C Pager: [phone] General Trauma PA Pager: [phone]   07/19/2012
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
## 2012-07-19 NOTE — Progress Notes (Signed)
Bronch performed at bedside by dr.wyatt, pt tolerated well, pt placed on 98% ATC prior to bronch, unable to wean fio2 back down at this time due to dec in spo2, dr.wyatt called and notified, no changes made will continue 98% ATC at this time and continue to monitor

## 2012-07-19 NOTE — Progress Notes (Signed)
Physical Therapy Treatment Patient Details Name: Alex Foster MRN: [id number] DOB: 27-Mar-1946 Today's Date: 07/19/2012 Time: 4696-2952 PT Time Calculation (min): 43 min  PT Assessment / Plan / Recommendation Comments on Treatment Session  Pt cont's to require +2 Total (A) for mobility but there was increased participation with bed>recliner today.      Follow Up Recommendations  Post acute inpatient     Does the patient have the potential to tolerate intense rehabilitation  No, Recommend LTACH  Barriers to Discharge        Equipment Recommendations       Recommendations for Other Services    Frequency Min 3X/week   Plan Discharge plan remains appropriate;Frequency remains appropriate    Precautions / Restrictions Precautions Precautions: Fall Restrictions Weight Bearing Restrictions: No    Pertinent Vitals/Pain 10 L.02 60% Fi02 Trach collar  Sats remained >90% entire session    Mobility  Bed Mobility Bed Mobility: Rolling Left;Left Sidelying to Sit;Sitting - Scoot to Edge of Bed Rolling Left: 1: +2 Total assist;With rail Rolling Left: Patient Percentage: 10% Left Sidelying to Sit: 1: +2 Total assist;HOB elevated Left Sidelying to Sit: Patient Percentage: 0% Details for Bed Mobility Assistance: (A) for all components of transitioning, however pt able to grab hold of rail with R UE with (A) to place on rail.   Transfers Transfers: Sit to Stand;Stand to Sit;Stand Pivot Transfers Sit to Stand: 1: +2 Total assist;From bed Sit to Stand: Patient Percentage: 20% Stand to Sit: 1: +2 Total assist;To chair/3-in-1;With upper extremity assist Stand to Sit: Patient Percentage: 20% Stand Pivot Transfers: 1: +2 Total assist Stand Pivot Transfers: Patient Percentage: 20% Details for Transfer Assistance: Pt able to activate LE's to (A) with standing, able to attempt LE advancement during pivot, & reaching with R UE to armrest of recliner.   Ambulation/Gait Ambulation/Gait  Assistance: Not tested (comment) Stairs: No Wheelchair Mobility Wheelchair Mobility: No Modified Rankin (Stroke Patients Only) Pre-Morbid Rankin Score: No symptoms Modified Rankin: Severe disability        PT Goals Acute Rehab PT Goals Time For Goal Achievement: 07/18/12 Potential to Achieve Goals: Fair Pt will go Supine/Side to Sit: with max assist PT Goal: Supine/Side to Sit - Progress: Not met Pt will Sit at [facility] of Bed: 3-5 min;with mod assist;with unilateral upper extremity support PT Goal: Sit at Edge Of Bed - Progress: Progressing toward goal Pt will go Sit to Stand: with +2 total assist;with upper extremity assist PT Goal: Sit to Stand - Progress: Progressing toward goal Pt will Transfer Bed to Chair/Chair to Bed: with +2 total assist PT Transfer Goal: Bed to Chair/Chair to Bed - Progress: Progressing toward goal Additional Goals Additional Goal #1: Pt. will participate in pregait activity at EOB with total a +2 (pt=40%) Additional Goal #2: Pt. will follow one-step commands 100% of the time throughout the session.   PT Goal: Additional Goal #2 - Progress: Progressing toward goal  Visit Information  Last PT Received On: 07/19/12 Assistance Needed: +2    Subjective Data      Cognition  Overall Cognitive Status: Impaired Area of Impairment: Attention;Following commands;Problem solving Arousal/Alertness: Awake/alert Behavior During Session: Flat affect Current Attention Level: Sustained Following Commands: Follows one step commands with increased time;Follows one step commands consistently Safety/Judgement: Decreased awareness of safety precautions;Decreased safety judgement for tasks assessed Safety/Judgement - Other Comments: Pt fidgeting with Cath tube & rectal pouch tube at times Cognition - Other Comments: Pt following simple 1 step commands fairly consistently today.  Performed various tasks while sitting EOB requring cues & (A) to initiate movement to carry out  tasks.      Balance  Balance Balance Assessed: Yes Static Sitting Balance Static Sitting - Balance Support: Right upper extremity supported;No upper extremity supported;Feet supported Static Sitting - Level of Assistance: 2: Max assist Static Sitting - Comment/# of Minutes: (A) level fluctuated between max (A) & min (A).  Pt cont's to push with R UE but does not seem to be as severe as previous PT sessions.  Unsure if pt is aware of LOB because there is not self-activation for correction unless cued to do so.  Pt sat EOB x 20 mins.   Dynamic Sitting Balance Dynamic Sitting - Balance Support: Feet supported;No upper extremity supported Dynamic Sitting - Level of Assistance: 2: Max assist;4: Min assist Dynamic Sitting - Comments: Performed forward/backwards weight shifting, lateral weight shifting, reaching for objects.    End of Session PT - End of Session Equipment Utilized During Treatment: Oxygen Activity Tolerance: Patient tolerated treatment well Patient left: in chair;with call bell/phone within reach;with nursing in room Nurse Communication: Mobility status;Need for lift equipment     Verdell Face, [place] [phone] 07/19/2012

## 2012-07-19 NOTE — Clinical Social Work Note (Signed)
Clinical Social Worker continuing to follow patient and family for support and discharge planning needs.  Patient continues to remain at 40% on trach collar and will not qualify for a SNF until he reaches 28%.  CSW left message for [facility] to determine bed availability for patient due to their abilities to manage his current 40% needs - CSW awaiting return phone call.  Patient continuing to work with therapies and wean.  CSW will continue to follow for support and to facilitate patient discharge needs once medically appropriate.  Alex Foster, [place] [phone]

## 2012-07-20 ENCOUNTER — Inpatient Hospital Stay (HOSPITAL_COMMUNITY): Payer: Medicaid Other

## 2012-07-20 LAB — GLUCOSE, CAPILLARY
Glucose-Capillary: 120 mg/dL — ABNORMAL HIGH (ref 70–99)
Glucose-Capillary: 143 mg/dL — ABNORMAL HIGH (ref 70–99)
Glucose-Capillary: 161 mg/dL — ABNORMAL HIGH (ref 70–99)
Glucose-Capillary: 102 mg/dL — ABNORMAL HIGH (ref 70–99)
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
## 2012-07-20 NOTE — Progress Notes (Signed)
Called to room by RN with c/o "patient pulled out his trach". Rapid Response was present. Upon arrival sats were 100% on NRB @ 15lpm. Inserted new trach tube, minimal bleeding noted. Positive end-tidal CO2. sats 100% on 40% ATC. Suctioned for copious amount blood tinged secretions. Trach secured per policy. RT will continue to monitor.

## 2012-07-20 NOTE — Progress Notes (Signed)
Patient ID: Alex Foster, male   DOB: 02-10-1946, 66 y.o.   MRN: [id number]   LOS: 50 days   Subjective: No acute events. Remains off vent.  Objective: Vital signs in last 24 hours: Temp:  [97 F (36.1 C)-98.9 F (37.2 C)] 97 F (36.1 C) (10/19 0400) Pulse Rate:  [78-100] 79  (10/19 0400) Resp:  [18-43] 18  (10/19 0400) BP: (101-160)/(52-99) 105/68 mmHg (10/19 0400) SpO2:  [90 %-98 %] 96 % (10/19 0400) FiO2 (%):  [40 %-98 %] 40 % (10/19 0355) Weight:  [207 lb 3.7 oz (94 kg)] 207 lb 3.7 oz (94 kg) (10/19 0447) Last BM Date: 07/18/12  Lab Results:  CBC  Basename 07/19/12 0430  WBC 8.7  HGB 10.1*  HCT 32.7*  PLT 257   Lab Results  Component Value Date   INR 2.20* 07/19/2012   INR 2.39* 07/17/2012   INR 2.51* 07/15/2012   CBG (last 3)   Basename 07/20/12 0333 07/20/12 0023 07/19/12 1954  GLUCAP 119* 120* 135*     Radiology PORTABLE CHEST - 1 VIEW  Comparison: 07/15/2012; 07/05/2012; 06/18/2012; chest CT -  05/31/2012  Findings: Grossly unchanged enlarged cardiac silhouette and  mediastinal contours given patient rotation to the left. Stable  position of support apparatus. Unchanged consolidative opacities  in the medial aspect of the left lung apex. Worsening left mid and  lower lung heterogeneous / consolidative opacities. Query small  left-sided effusion. Improved aeration of the right lung base with  persistent right lateral chest wall pleural parenchymal thickening.  No definite pneumothorax. Grossly unchanged bones including  multiple displaced right-sided rib fractures.  IMPRESSION:  1. Stable positioning of support apparatus. No pneumothorax.  2. Worsening left-sided consolidative opacities favored to  represent a combination of air space disease and atelectasis.  3. Minimally improved aeration of the right lower lung.  Original Report Authenticated By: Waynard Reeds, M.D.    General appearance: no distress Resp: rhonchi bilaterally Cardio:  regular rate and rhythm GI: normal findings: bowel sounds normal and soft, non-tender   Assessment/Plan: [facility]  Respiratory failure -- on guaifenesin for mucous production.  cx pending.   TBI w/ICC -- per NS. Supportive care, OT/PT as tolerated.  CVA -- per NS  Multiple facial fxs -- Non-operative per ENT  Multiple bilateral rib fxs w/HTPX s/p bilateral CT  ABL anemia -- stable  UE DVT -- Coumadin  Urinary retention -- foley replaced  DM -- Stable  FEN -- TF via PEG. Slowly wean Klonopin.  GI - soft stools with tube feeds.  Flexiseal in place.   Dispo -- SNF once FiO2 weaned to acceptable levels.    07/20/2012

## 2012-07-21 DIAGNOSIS — J209 Acute bronchitis, unspecified: Secondary | ICD-10-CM

## 2012-07-21 LAB — GLUCOSE, CAPILLARY
Glucose-Capillary: 148 mg/dL — ABNORMAL HIGH (ref 70–99)
Glucose-Capillary: 124 mg/dL — ABNORMAL HIGH (ref 70–99)

## 2012-07-21 NOTE — Progress Notes (Signed)
Trauma Service Note  Subjective: Patient was able to verbalize that he was okay to me today.  Objective: Vital signs in last 24 hours: Temp:  [97.5 F (36.4 C)-99.1 F (37.3 C)] 97.6 F (36.4 C) (10/20 0800) Pulse Rate:  [62-88] 74  (10/20 0809) Resp:  [3-25] 18  (10/20 0809) BP: (101-160)/(52-93) 106/65 mmHg (10/20 0800) SpO2:  [91 %-99 %] 94 % (10/20 0809) FiO2 (%):  [40 %] 40 % (10/20 0809) Weight:  [98 kg (216 lb 0.8 oz)] 98 kg (216 lb 0.8 oz) (10/20 0547) Last BM Date: 07/21/12 (flexiseal)  Intake/Output from previous day: 10/19 0701 - 10/20 0700 In: 1925 [NG/GT:660] Out: 1775 [Urine:1775] Intake/Output this shift: Total I/O In: 110 [Other:110] Out: 150 [Urine:150]  General: No distress but still intermittently agitated.  Lungs: Clear.  Oxygen saturations 93-95% on FIO2 40%  Abd: Soft, good bowel sounds  Extremities: No changes  Neuro: Agitated at times, in mittens and restraints.  G-tube needs an abdominal binder on it to keep patient from pulling it out  Lab Results: CBC   Basename 07/19/12 0430  WBC 8.7  HGB 10.1*  HCT 32.7*  PLT 257   BMET No results found for this basename: NA:2,K:2,CL:2,CO2:2,GLUCOSE:2,BUN:2,CREATININE:2,CALCIUM:2 in the last 72 hours PT/INR  Basename 07/19/12 0430  LABPROT 23.5*  INR 2.20*   ABG No results found for this basename: PHART:2,PCO2:2,PO2:2,HCO3:2 in the last 72 hours  Studies/Results: Dg Chest Port 1 View  07/20/2012  *RADIOLOGY REPORT*  Clinical Data: Left sided airspace opacities.  Bronchoscopy yesterday.  PORTABLE CHEST - 1 VIEW  Comparison: 07/19/2012  Findings: Tracheal tube satisfactorily positioned. Acute and chronic right rib deformities are observed.  Egbert Garibaldi shot projects over the chest.  The patient is rotated to the left on today's exam, resulting in reduced diagnostic sensitivity and specificity. Ill-defined nodular density at the left lung apex noted.  Improved aeration in the lingula and left lung base.   There is felt to be some persistent left lower lobe airspace opacity.  IMPRESSION:  1.  Improved aeration in the left lung, with some persistent opacity at the left lung apex and in the retrocardiac position. 2.  Acute and chronic right rib deformities. 3.  Tracheal tube satisfactorily positioned.   Original Report Authenticated By: Dellia Cloud, M.D.     Anti-infectives: Anti-infectives     Start     Dose/Rate Route Frequency Ordered Stop   06/24/12 1800   levofloxacin (LEVAQUIN) IVPB 750 mg        750 mg 100 mL/hr over 90 Minutes Intravenous Every 24 hours 06/24/12 1539 07/07/12 2015   06/23/12 1000   fluconazole (DIFLUCAN) IVPB 100 mg  Status:  Discontinued        100 mg 50 mL/hr over 60 Minutes Intravenous Every 24 hours 06/22/12 1632 07/01/12 0809   06/23/12 0000   vancomycin (VANCOCIN) 1,500 mg in sodium chloride 0.9 % 500 mL IVPB  Status:  Discontinued        1,500 mg 250 mL/hr over 120 Minutes Intravenous Every 24 hours 06/22/12 1145 06/24/12 1539   06/22/12 1730   fluconazole (DIFLUCAN) IVPB 200 mg        200 mg 100 mL/hr over 60 Minutes Intravenous  Once 06/22/12 1630 06/22/12 1852   06/19/12 2200   vancomycin (VANCOCIN) IVPB 1000 mg/200 mL premix  Status:  Discontinued        1,000 mg 200 mL/hr over 60 Minutes Intravenous Every 12 hours 06/19/12 0940 06/22/12 1123  06/19/12 1000   vancomycin (VANCOCIN) 2,000 mg in sodium chloride 0.9 % 500 mL IVPB        2,000 mg 250 mL/hr over 120 Minutes Intravenous  Once 06/19/12 0940 06/19/12 1300   06/19/12 0930   ceFEPIme (MAXIPIME) 2 g in dextrose 5 % 50 mL IVPB  Status:  Discontinued        2 g 100 mL/hr over 30 Minutes Intravenous 3 times per day 06/19/12 0904 06/24/12 1539   06/04/12 1000   vancomycin (VANCOCIN) IVPB 1000 mg/200 mL premix  Status:  Discontinued        1,000 mg 200 mL/hr over 60 Minutes Intravenous Every 12 hours 06/04/12 0837 06/05/12 1045   06/03/12 1800   vancomycin (VANCOCIN) 1,500 mg in sodium  chloride 0.9 % 500 mL IVPB  Status:  Discontinued        1,500 mg 250 mL/hr over 120 Minutes Intravenous Every 24 hours 06/02/12 1428 06/04/12 0837   06/02/12 1800   vancomycin (VANCOCIN) 2,000 mg in sodium chloride 0.9 % 500 mL IVPB        2,000 mg 250 mL/hr over 120 Minutes Intravenous  Once 06/02/12 1428 06/02/12 2024   06/02/12 1400   cefTAZidime (FORTAZ) 1 g in dextrose 5 % 50 mL IVPB        1 g 100 mL/hr over 30 Minutes Intravenous 3 times per day 06/02/12 1349 06/12/12 0642          Assessment/Plan: s/p Procedure(s): PERCUTANEOUS TRACHEOSTOMY PERCUTANEOUS ENDOSCOPIC GASTROSTOMY (PEG) PLACEMENT Continue present management and attempt to wean FIO2.  LOS: 51 days   Marta Lamas. Gae Bon, MD, FACS [phone] Trauma Surgeon 07/21/2012

## 2012-07-22 LAB — CULTURE, BAL-QUANTITATIVE W GRAM STAIN: Special Requests: NORMAL

## 2012-07-22 LAB — PROTIME-INR
INR: 2.15 — ABNORMAL HIGH (ref 0.00–1.49)
Prothrombin Time: 23.1 seconds — ABNORMAL HIGH (ref 11.6–15.2)

## 2012-07-22 LAB — CULTURE, RESPIRATORY W GRAM STAIN

## 2012-07-22 LAB — GLUCOSE, CAPILLARY
Glucose-Capillary: 116 mg/dL — ABNORMAL HIGH (ref 70–99)
Glucose-Capillary: 139 mg/dL — ABNORMAL HIGH (ref 70–99)

## 2012-07-22 MED ORDER — SODIUM CHLORIDE 0.9 % IV SOLN
1500.0000 mg | INTRAVENOUS | Status: AC
  Administered 2012-07-22 – 2012-08-03 (×13): 1500 mg via INTRAVENOUS
  Filled 2012-07-22 (×14): qty 1500

## 2012-07-22 NOTE — Clinical Social Work Note (Signed)
Clinical Social Worker continuing to follow for placement needs.  CSW left message with [facility] admissions coordinator to update on patient current isolation status - awaiting response.  CSW will continue to be available for support and to facilitate patient discharge needs once a bed becomes available at a facility that can manage patient current needs.  Macario Golds, [place] [phone]

## 2012-07-22 NOTE — Progress Notes (Signed)
Patient ID: Alex Foster, male   DOB: [birth year], 66 y.o.   MRN: [id number]    Subjective: Moving around in bed  Objective: Vital signs in last 24 hours: Temp:  [97.5 F (36.4 C)-98.4 F (36.9 C)] 98.3 F (36.8 C) (10/21 0745) Pulse Rate:  [76-95] 92  (10/21 0806) Resp:  [18-30] 25  (10/21 0806) BP: (106-173)/(36-89) 153/89 mmHg (10/21 0444) SpO2:  [89 %-98 %] 94 % (10/21 0806) FiO2 (%):  [40 %-60 %] 40 % (10/21 0806) Weight:  [97 kg (213 lb 13.5 oz)] 97 kg (213 lb 13.5 oz) (10/21 0340) Last BM Date: 07/21/12  Intake/Output from previous day: 10/20 0701 - 10/21 0700 In: 2100 [NG/GT:780] Out: 1050 [Urine:1050] Intake/Output this shift: Total I/O In: -  Out: 300 [Urine:300]  General appearance: alert and cooperative Neck: trach in place Resp: clear to auscultation bilaterally Cardio: regular rate and rhythm GI: soft, NT, PEG site OK, binder not on Neurologic: Motor: moves R side and LLE to command  Lab Results: CBC  No results found for this basename: WBC:2,HGB:2,HCT:2,PLT:2 in the last 72 hours BMET No results found for this basename: NA:2,K:2,CL:2,CO2:2,GLUCOSE:2,BUN:2,CREATININE:2,CALCIUM:2 in the last 72 hours PT/INR  Basename 07/22/12 0500  LABPROT 23.1*  INR 2.15*   ABG No results found for this basename: PHART:2,PCO2:2,PO2:2,HCO3:2 in the last 72 hours  Studies/Results: No results found.  Anti-infectives: Anti-infectives     Start     Dose/Rate Route Frequency Ordered Stop   06/24/12 1800   levofloxacin (LEVAQUIN) IVPB 750 mg        750 mg 100 mL/hr over 90 Minutes Intravenous Every 24 hours 06/24/12 1539 07/07/12 2015   06/23/12 1000   fluconazole (DIFLUCAN) IVPB 100 mg  Status:  Discontinued        100 mg 50 mL/hr over 60 Minutes Intravenous Every 24 hours 06/22/12 1632 07/01/12 0809   06/23/12 0000   vancomycin (VANCOCIN) 1,500 mg in sodium chloride 0.9 % 500 mL IVPB  Status:  Discontinued        1,500 mg 250 mL/hr over 120 Minutes Intravenous  Every 24 hours 06/22/12 1145 06/24/12 1539   06/22/12 1730   fluconazole (DIFLUCAN) IVPB 200 mg        200 mg 100 mL/hr over 60 Minutes Intravenous  Once 06/22/12 1630 06/22/12 1852   06/19/12 2200   vancomycin (VANCOCIN) IVPB 1000 mg/200 mL premix  Status:  Discontinued        1,000 mg 200 mL/hr over 60 Minutes Intravenous Every 12 hours 06/19/12 0940 06/22/12 1123   06/19/12 1000   vancomycin (VANCOCIN) 2,000 mg in sodium chloride 0.9 % 500 mL IVPB        2,000 mg 250 mL/hr over 120 Minutes Intravenous  Once 06/19/12 0940 06/19/12 1300   06/19/12 0930   ceFEPIme (MAXIPIME) 2 g in dextrose 5 % 50 mL IVPB  Status:  Discontinued        2 g 100 mL/hr over 30 Minutes Intravenous 3 times per day 06/19/12 0904 06/24/12 1539   06/04/12 1000   vancomycin (VANCOCIN) IVPB 1000 mg/200 mL premix  Status:  Discontinued        1,000 mg 200 mL/hr over 60 Minutes Intravenous Every 12 hours 06/04/12 0837 06/05/12 1045   06/03/12 1800   vancomycin (VANCOCIN) 1,500 mg in sodium chloride 0.9 % 500 mL IVPB  Status:  Discontinued        1,500 mg 250 mL/hr over 120 Minutes Intravenous Every 24 hours 06/02/12 1428 06/04/12  4540   06/02/12 1800   vancomycin (VANCOCIN) 2,000 mg in sodium chloride 0.9 % 500 mL IVPB        2,000 mg 250 mL/hr over 120 Minutes Intravenous  Once 06/02/12 1428 06/02/12 2024   06/02/12 1400   cefTAZidime (FORTAZ) 1 g in dextrose 5 % 50 mL IVPB        1 g 100 mL/hr over 30 Minutes Intravenous 3 times per day 06/02/12 1349 06/12/12 0642          Assessment/Plan: s/p Procedure(s): PERCUTANEOUS TRACHEOSTOMY PERCUTANEOUS ENDOSCOPIC GASTROSTOMY (PEG) PLACEMENT [facility]  Respiratory failure -- has remained on HTC but FiO2 60% overnight, now back to 40% TBI w/ICC -- per NS. Supportive care, OT/PT as tolerated.  CVA -- per NS  Multiple facial fxs -- Non-operative per ENT  Multiple bilateral rib fxs w/HTPX s/p bilateral CT  ID - BAL Friday > 100k Staph, will start Vanco pending  sensitivities ABL anemia -- check labs AM UE DVT -- Coumadin  Urinary retention -- foley replaced  DM -- Stable  FEN -- TF via PEG Dispo -- LTACH vs SNF if FiO2 weaned to acceptable levels.  LOS: 52 days    Violeta Gelinas, MD, MPH, FACS Pager: [phone]  07/22/2012
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
## 2012-07-23 ENCOUNTER — Inpatient Hospital Stay (HOSPITAL_COMMUNITY): Payer: Medicaid Other

## 2012-07-23 LAB — CBC
HCT: 32.3 % — ABNORMAL LOW (ref 39.0–52.0)
MCV: 86.1 fL (ref 78.0–100.0)
RDW: 15.9 % — ABNORMAL HIGH (ref 11.5–15.5)
WBC: 7.5 10*3/uL (ref 4.0–10.5)

## 2012-07-23 LAB — BASIC METABOLIC PANEL
BUN: 35 mg/dL — ABNORMAL HIGH (ref 6–23)
Chloride: 100 mEq/L (ref 96–112)
Creatinine, Ser: 0.78 mg/dL (ref 0.50–1.35)
GFR calc Af Amer: >90 mL/min (ref >90)

## 2012-07-23 LAB — GLUCOSE, CAPILLARY
Glucose-Capillary: 100 mg/dL — ABNORMAL HIGH (ref 70–99)
Glucose-Capillary: 89 mg/dL (ref 70–99)
Glucose-Capillary: 124 mg/dL — ABNORMAL HIGH (ref 70–99)

## 2012-07-23 MED ORDER — IPRATROPIUM BROMIDE 0.02 % IN SOLN: 0.5000 mg | Freq: Four times a day (QID) | RESPIRATORY_TRACT | Status: DC | PRN

## 2012-07-23 MED ORDER — ALBUTEROL SULFATE (5 MG/ML) 0.5% IN NEBU: 2.5000 mg | INHALATION_SOLUTION | Freq: Four times a day (QID) | RESPIRATORY_TRACT | Status: DC | PRN

## 2012-07-23 MED ORDER — MUPIROCIN 2 % EX OINT
1.0000 "application " | TOPICAL_OINTMENT | Freq: Two times a day (BID) | CUTANEOUS | Status: AC
  Administered 2012-07-23 – 2012-07-27 (×10): 1 via NASAL
  Filled 2012-07-23 (×2): qty 22

## 2012-07-23 MED ORDER — CHLORHEXIDINE GLUCONATE CLOTH 2 % EX PADS
6.0000 | MEDICATED_PAD | Freq: Every day | CUTANEOUS | Status: AC
  Administered 2012-07-23 – 2012-07-27 (×5): 6 via TOPICAL
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
## 2012-07-23 NOTE — Progress Notes (Addendum)
Physical Therapy Treatment Patient Details Name: Alex Foster MRN: [id number] DOB: 1945-11-06 Today's Date: 07/23/2012 Time: 4098-1191 PT Time Calculation (min): 54 min  PT Assessment / Plan / Recommendation Comments on Treatment Session  Pt's sitting EOB balance is slowly progressing.  Tolerated ~20 minutes sitting EOB with up to +2 total (A) required but with brief periods of min guard.  Also, pt able to stand x 2 minutes with +2 total.  Goals revised at this date.      Follow Up Recommendations  Post acute inpatient     Does the patient have the potential to tolerate intense rehabilitation  No, Recommend LTACH  Barriers to Discharge        Equipment Recommendations  Other (comment) (TBD)    Recommendations for Other Services    Frequency Min 3X/week   Plan Discharge plan remains appropriate;Frequency remains appropriate    Precautions / Restrictions Precautions Precautions: Fall Restrictions Weight Bearing Restrictions: No       Mobility  Bed Mobility Bed Mobility: Left Sidelying to Sit;Sitting - Scoot to [place] of Bed;Rolling Left Rolling Left: 1: +1 Total assist Left Sidelying to Sit: 1: +2 Total assist;With rails;HOB elevated Left Sidelying to Sit: Patient Percentage: 10% Supine to Sit: 1: +2 Total assist Supine to Sit: Patient Percentage: 10% Sitting - Scoot to Edge of Bed: 1: +1 Total assist Details for Bed Mobility Assistance: Cont's to require step-by-step cueing for sequencing & technique.  Pt doing better with initiating movement with LE's but still needs (A) to carryout movement.  Total (A) to bring trunk & shoulders to sitting upright.   Transfers Transfers: Sit to Stand;Stand to Sit;Stand Pivot Transfers Sit to Stand: 1: +2 Total assist;From bed Sit to Stand: Patient Percentage: 30% Stand to Sit: 1: +2 Total assist;To bed;To chair/3-in-1 Stand to Sit: Patient Percentage: 20% Stand Pivot Transfers: 1: +2 Total assist Stand Pivot Transfers: Patient  Percentage: 20% Details for Transfer Assistance: used "3 muskateer" technique.  (A) to lift hips off bed, achieve hip/trunk extension, balance, & rotation of body from bed>recliner.  Pt able to stand with bil UE's supported on therapists shoulders for ~2 mins.  Seemed to have increased difficulty moving LE's during pivot today compared to last therapy session.   Ambulation/Gait Ambulation/Gait Assistance: Not tested (comment)     PT Goals Acute Rehab PT Goals PT Goal Formulation: Patient unable to participate in goal setting Time For Goal Achievement: 08/06/12 Potential to Achieve Goals: Fair Pt will go Supine/Side to Sit: with mod assist PT Goal: Supine/Side to Sit - Progress: Revised due to lack of progress Pt will Sit at [facility] of Bed: with unilateral upper extremity support;6-10 min;with min assist PT Goal: Sit at Edge Of Bed - Progress: Revised due to lack of progress Pt will go Sit to Stand: with max assist;with upper extremity assist PT Goal: Sit to Stand - Progress: Revised due to lack of progress Pt will Transfer Bed to Chair/Chair to Bed: with mod assist PT Transfer Goal: Bed to Chair/Chair to Bed - Progress: Revised due to lack of progress Additional Goals Additional Goal #1: Pt. will participate in pregait activity at EOB with total a +2 (pt=40%)  Visit Information  Last PT Received On: 07/23/12 Assistance Needed: +2    Subjective Data  Subjective: "of course I look good, I'm a Kann"   Cognition  Overall Cognitive Status: Impaired Area of Impairment: Memory;Problem solving Orientation Level: Disoriented to;Time;Place;Situation Behavior During Session: Flat affect Current Attention Level: Sustained  Balance  Balance Balance Assessed: Yes Static Sitting Balance Static Sitting - Balance Support: Right upper extremity supported Static Sitting - Level of Assistance: 1: +2 Total assist Static Sitting - Comment/# of Minutes: Pt sat EOB x 20 minutes.  Overall  requiring total (A) (+2 helpful) with brief (~5 sec intervals) with min guard (A).  (A) provided at shoulders, trunk, & hips for sitting midline.  Pt cont's to lean posteriorly & to Lt side.  Very little to none at all self-correction noted.    End of Session PT - End of Session Activity Tolerance: Patient tolerated treatment well;Patient limited by fatigue Patient left: in chair;with call bell/phone within reach Nurse Communication: Need for lift equipment    Verdell Face, PTA [phone] 07/23/2012

## 2012-07-23 NOTE — Progress Notes (Signed)
Pt in restraints and mitten to rt hand on rounds found PEG tube out.

## 2012-07-23 NOTE — Progress Notes (Signed)
Dr.Thompson notified of patient pulling peg tube .MD to come see patient. me

## 2012-07-23 NOTE — Progress Notes (Signed)
[facility] Speaking Valve - Treatment Patient Details  Name: Alex Foster MRN: [id number] Date of Birth: 09-04-1946  Today's Date: 07/23/2012 Time: 1025-1045 SLP Time Calculation (min): 20 min  Past Medical History:  Past Medical History  Diagnosis Date  . Myocardial infarction   . Hypertension   . Diabetes mellitus    Past Surgical History: History reviewed. No pertinent past surgical history.  Assessment / Plan / Recommendation Clinical Impression  Treatment focused on expressive communication and cognition while donning speaking valve.  Pt. with congested/audible pharyngeal phlegm which he was able to expectorate from trach with verbal cues for effortful cough.  Vocal intensity much improved today (? increased breath support vs. productive cough and ability to clear secretions).  Pt. expressed wants and frequently requested "coffee" and to "get out of this bed".  PMSV valve worn with SLP observation for 20-25 minutes with SpO2 at 93-94% majority of session.  RR and HR were stable.  No evidence of Co2 retention.  PMSV use significantly improved today and pt. able to functionally state wants/needs/thoughts.  ST recommends pt. wear PMSV for one hour intervals throughout the dat with intermittent supervision.  Reviewed all precautions with RN (although cuff remains deflated, educated RN to always check for deflation prior to donning valve).  SLP will request order for swallow assessment.                     SLP Goals Potential to Achieve Goals: Good SLP Goal #1: Pt. will verbalize in 3-4 word phrases with deep inhalation with max verbal/visual/tactile cues with vital signs stable. SLP Goal #1 - Progress: Progressing toward goal SLP Goal #2: Pt. will follow 1 step commands with 90% accuracy. SLP Goal #2 - Progress: Progressing toward goal SLP Goal #3: Pt. will increase spatial and situational orientation via yes/no head gestures with 75% with mod environmental cues.  SLP Goal #3 -  Progress: Progressing toward goal                Vent Dependency  FiO2 (%): 35 %          Breck Coons [place].Ed [employer] [phone]  07/23/2012
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
## 2012-07-23 NOTE — Clinical Social Work Note (Signed)
Clinical Social Worker continuing to follow patient for discharge planning needs.  Patient remains on trach collar but was able to tolerate 35% today.  Patient continuing to successfully wean at a slow pace.  CSW spoke with [facility] admissions coordinator who states that they are expecting a discharge late this week or early next week and patient is first on the list for next available bed.  CSW will continue to follow patient and if patient is able to remain on 28% trach collar for 24 hours he may be appropriate for a regular SNF bed.    Clinical Social Worker will continue to follow and follow up with patient brother for continued updates of patient discharge plans.  Macario Golds, [place] [phone]

## 2012-07-23 NOTE — Progress Notes (Signed)
flexi seal out. No bm noted. Did not reinsert flexiseal at this time. Will continue to monitor

## 2012-07-23 NOTE — Progress Notes (Signed)
flexiseal pulled out. Attempted reinsertion x 2 but was leaking. New flexi ordered.
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
## 2012-07-23 NOTE — Progress Notes (Signed)
Nutrition Follow-up  Intervention:   1.  Enteral nutrition; continue current management 2.  Modify diet; modify diet if medically appropriate and safe per SLP/MD.  Assessment:   Pt with improved wt status after changes in TF provision.   Wt is now variable, however pt no longer trending wt loss.  Pt no longer on vent support and with improved ability to communicate/express needs. Pt working with SLP for PMSV, and may be assessed for PO diet.  Discharge planning ongoing.  Pt continues Pivot 1.5 @ 55 mL/hr with Prostat 4 times daily which provides 2380 kcal, 183g protein, and 1003 mL free water.  Diet Order:  Pivot 1.5 @ 55 mL/hr with Prostat 4 times daily.  Meds: Scheduled Meds:   . antiseptic oral rinse  15 mL Mouth Rinse QID  . bacitracin   Topical BID  . chlorhexidine  15 mL Mouth Rinse BID  . Chlorhexidine Gluconate Cloth  6 each Topical Q0600  . clonazePAM  0.5 mg Oral BID WC  . clonazePAM  1 mg Per Tube QHS  . feeding supplement  30 mL Per Tube QID  . free water  220 mL Per Tube Q4H  . guaiFENesin  20 mL Per Tube Q4H  . insulin aspart  0-15 Units Subcutaneous Q4H  . insulin glargine  45 Units Subcutaneous Daily  . metFORMIN  500 mg Oral BID WC  . multivitamin  5 mL Per Tube Daily  . mupirocin ointment  1 application Nasal BID  . pantoprazole sodium  40 mg Per Tube Q1200  . potassium chloride  20 mEq Per Tube BID  . sodium chloride  10-40 mL Intracatheter Q12H  . vancomycin  1,500 mg Intravenous Q24H  . warfarin  5 mg Oral Q48H  . warfarin  7.5 mg Oral Q48H  . Warfarin - Pharmacist Dosing Inpatient   Does not apply q1800  . DISCONTD: albuterol  2.5 mg Nebulization Q4H  . DISCONTD: ipratropium  0.5 mg Nebulization Q4H   Continuous Infusions:   . feeding supplement (PIVOT 1.5 CAL) 1,000 mL (07/22/12 1659)   PRN Meds:.acetaminophen (TYLENOL) oral liquid 160 mg/5 mL, albuterol, bisacodyl, fentaNYL, ipratropium, ondansetron (ZOFRAN) IV, sodium chloride  Labs:  CMP       Component Value Date/Time   NA 136 07/23/2012 0440   K 4.2 07/23/2012 0440   CL 100 07/23/2012 0440   CO2 29 07/23/2012 0440   GLUCOSE 112* 07/23/2012 0440   BUN 35* 07/23/2012 0440   CREATININE 0.78 07/23/2012 0440   CALCIUM 8.9 07/23/2012 0440   PROT 5.8* 07/08/2012 0450   ALBUMIN 1.9* 07/08/2012 0450   AST 20 07/08/2012 0450   ALT 9 07/08/2012 0450   ALKPHOS 164* 07/08/2012 0450   BILITOT 0.6 07/08/2012 0450   GFRNONAA >90 07/23/2012 0440   GFRAA >90 07/23/2012 0440     Intake/Output Summary (Last 24 hours) at 07/23/12 1256 Last data filed at 07/23/12 0740  Gross per 24 hour  Intake   1320 ml  Output   1425 ml  Net   -105 ml  Last BM (10/21)  Weight Status:  216 lbs, now variable, no longer trending loss  Re-estimated needs:  2340-2630 kcal, >/=178g protein, >2.3 L/day  Nutrition Dx: Inadequate oral intake, ongoing  Monitor:   1. Enteral nutrition; continued tolerance with pt meeting >90% of estimated needs. Somewhat met, pt to advance TF.  2. Wt/wt change; monitor trends. Ongoing.   Loyce Dys, MS RD LDN Clinical Inpatient Dietitian Pager: [phone] Weekend/After  hours pager: [phone]
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
## 2012-07-23 NOTE — Progress Notes (Signed)
Patient has not required the ventilator and has been removed from the room.

## 2012-07-24 LAB — BASIC METABOLIC PANEL
BUN: 31 mg/dL — ABNORMAL HIGH (ref 6–23)
CO2: 28 mEq/L (ref 19–32)
Chloride: 98 mEq/L (ref 96–112)
Creatinine, Ser: 0.8 mg/dL (ref 0.50–1.35)
Glucose, Bld: 118 mg/dL — ABNORMAL HIGH (ref 70–99)

## 2012-07-24 LAB — GLUCOSE, CAPILLARY
Glucose-Capillary: 103 mg/dL — ABNORMAL HIGH (ref 70–99)
Glucose-Capillary: 121 mg/dL — ABNORMAL HIGH (ref 70–99)
Glucose-Capillary: 104 mg/dL — ABNORMAL HIGH (ref 70–99)

## 2012-07-24 NOTE — Progress Notes (Signed)
Physical Therapy Treatment Patient Details Name: Alex Foster MRN: [id number] DOB: August 24, 1946 Today's Date: 07/24/2012 Time: 4098-1191 PT Time Calculation (min): 33 min  PT Assessment / Plan / Recommendation Comments on Treatment Session  Pt progressing slowly at this date.  Trialed sitting EOB balance with sara plus to provide increased support around trunk to provide increased support    Follow Up Recommendations  Post acute inpatient     Does the patient have the potential to tolerate intense rehabilitation  No, Recommend LTACH  Barriers to Discharge        Equipment Recommendations  Other (comment) (defer to next venue)    Recommendations for Other Services    Frequency Min 3X/week   Plan Discharge plan remains appropriate;Frequency remains appropriate    Precautions / Restrictions Precautions Precautions: Fall Restrictions Weight Bearing Restrictions: No   Pertinent Vitals/Pain Pt on 28% Fi02, 8 L 02, Sats 88-93% entire session.      Mobility  Bed Mobility Bed Mobility: Supine to Sit;Sitting - Scoot to Edge of Bed Supine to Sit: 1: +2 Total assist;HOB elevated Supine to Sit: Patient Percentage: 10% Sitting - Scoot to Edge of Bed: 1: +2 Total assist Sitting - Scoot to Edge of Bed: Patient Percentage: 0% Sit to Supine: 1: +2 Total assist Sit to Supine: Patient Percentage: 0% Scooting to HOB: 1: +2 Total assist Scooting to [facility]: Patient Percentage: 0% Details for Bed Mobility Assistance: Increased (A) for all components.   Transfers Transfers: Not assessed Ambulation/Gait Ambulation/Gait Assistance: Not tested (comment)    Exercises     PT Diagnosis:    PT Problem List:   PT Treatment Interventions:     PT Goals Acute Rehab PT Goals Time For Goal Achievement: 08/06/12 Potential to Achieve Goals: Fair Pt will go Supine/Side to Sit: with mod assist PT Goal: Supine/Side to Sit - Progress: Not progressing Pt will Sit at [facility] of Bed: with unilateral upper  extremity support;6-10 min;with min assist PT Goal: Sit at Edge Of Bed - Progress: Not progressing Pt will go Sit to Stand: with max assist;with upper extremity assist Pt will Transfer Bed to Chair/Chair to Bed: with mod assist Additional Goals Additional Goal #1: Pt. will participate in pregait activity at EOB with total a +2 (pt=40%)  Visit Information  Last PT Received On: 07/24/12 Assistance Needed: +2    Subjective Data      Cognition  Overall Cognitive Status: Impaired Area of Impairment: Awareness of errors;Awareness of deficits;Problem solving;Attention Arousal/Alertness: Awake/alert Orientation Level: Disoriented to;Place (in a barn);Time;Situation Behavior During Session: Flat affect Current Attention Level: Focused Attention - Other Comments: Pt with decreased focus today.  Gazes right.  Max cues for increased attention to task & maintains focus for seconds at time.   Following Commands: Follows one step commands inconsistently (~50% of time) Safety/Judgement - Other Comments: Per RN, pt has been pulling at lines & tubes today.   Awareness of Errors: Assistance required to identify errors made;Assistance required to correct errors made Awareness of Errors - Other Comments: Pt possibly unaware of balance deficits sitting EOB.  When asked if he felt himself falling to Lt, he responded with "No" Problem Solving: Max cueing for all functional mobilty Cognition - Other Comments: Pt more agitated today per RN & appeared to be more confused than last PT session.  Following 1 step commands 50% of time today.       Balance  Balance Balance Assessed: Yes Static Sitting Balance Static Sitting - Balance Support: Feet supported;Bilateral  upper extremity supported Static Sitting - Level of Assistance: 1: +2 Total assist;Other (comment) (use of sara plus) Static Sitting - Comment/# of Minutes: Trialed sara plus to postural control sitting EOB.  Pt cont's to lean to Lt with no  noticeable attempt to self-correct.  Did not trial sit<>stand due to poor sitting balance today.    End of Session PT - End of Session Equipment Utilized During Treatment: Oxygen (sara plus) Patient left: in bed;with call bell/phone within reach Nurse Communication: Need for lift equipment     Verdell Face, PTA [phone] 07/24/2012
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
## 2012-07-24 NOTE — Progress Notes (Signed)
Water bottle full 

## 2012-07-24 NOTE — Progress Notes (Signed)
Found on 5lpm

## 2012-07-24 NOTE — Clinical Social Work Note (Signed)
Clinical Social Worker continuing to follow patient for discharge planning needs.  Patient continues to remain on trach collar and has successfully weaned to 28% today.  CSW spoke with facilities in [place], [place] per patient brother request about a possible admission - updated clinicals sent and awaiting response.    Clinical Social Worker to facilitate patient discharge needs tomorrow as long as patient is able to maintain at 28% overnight.  CSW available for support as needed.  [address] [place], [place] [phone]

## 2012-07-24 NOTE — Progress Notes (Signed)
Have collaborated with this patient and note. 07/24/2012  Ken Jalan Bodi, PT [phone] (pager)  

## 2012-07-24 NOTE — Progress Notes (Signed)
Paged Trauma PA regarding pt CBG of 76 and metformin due at 1700.  Orders were received to hold metformin. Will continue to monitor.  Salomon Mast, RN

## 2012-07-24 NOTE — Progress Notes (Signed)
Trauma Service Note  Subjective: Patient is very agitated and moving a lot today.  Objective: Vital signs in last 24 hours: Temp:  [97.4 F (36.3 C)-98.9 F (37.2 C)] 97.4 F (36.3 C) (10/23 0816) Pulse Rate:  [76-89] 85  (10/23 0816) Resp:  [21-34] 34  (10/23 0816) BP: (118-169)/(57-104) 152/104 mmHg (10/23 0816) SpO2:  [90 %-100 %] 95 % (10/23 0816) FiO2 (%):  [35 %] 35 % (10/23 0816) Weight:  [98.4 kg (216 lb 14.9 oz)] 98.4 kg (216 lb 14.9 oz) (10/23 0439) Last BM Date: 07/23/12  Intake/Output from previous day: 10/22 0701 - 10/23 0700 In: 960 [NG/GT:440] Out: 1925 [Urine:1925] Intake/Output this shift:    General: Agitated on collar  Lungs: Frothy tracheal aspirate  Abd: Soft, nontender, replaced G-tube functioning well.  Extremities: No changes  Neuro: Agitated and moving right side a lot.  Able to pull out  His G-tube  Lab Results: CBC   Basename 07/23/12 0440  WBC 7.5  HGB 10.3*  HCT 32.3*  PLT 226   BMET  Basename 07/24/12 0500 07/23/12 0440  NA 134* 136  K 3.9 4.2  CL 98 100  CO2 28 29  GLUCOSE 118* 112*  BUN 31* 35*  CREATININE 0.80 0.78  CALCIUM 9.1 8.9   PT/INR  Basename 07/24/12 0500 07/22/12 0500  LABPROT 21.7* 23.1*  INR 1.98* 2.15*   ABG No results found for this basename: PHART:2,PCO2:2,PO2:2,HCO3:2 in the last 72 hours  Studies/Results: Dg Chest Port 1 View  07/23/2012  *RADIOLOGY REPORT*  Clinical Data: Pneumonia, short of breath  PORTABLE CHEST - 1 VIEW  Comparison: Chest radiograph 07/20/2012  Findings: Tracheostomy tube is unchanged.  Stable cardiac silhouette.  There is fullness in the upper mediastinum which is similar to multiple comparison exams.  Heart silhouette is stable. There is left basilar atelectasis.  No overt pulmonary edema. Right lateral rib deformities are again noted.  IMPRESSION:  1.  No significant change. 2.  Left basilar atelectasis. 3.  Stable fullness in the upper mediastinum.   Original Report  Authenticated By: Genevive Bi, M.D.     Anti-infectives: Anti-infectives     Start     Dose/Rate Route Frequency Ordered Stop   07/22/12 1000   vancomycin (VANCOCIN) 1,500 mg in sodium chloride 0.9 % 500 mL IVPB        1,500 mg 250 mL/hr over 120 Minutes Intravenous Every 24 hours 07/22/12 0945 08/04/12 0809   06/24/12 1800   levofloxacin (LEVAQUIN) IVPB 750 mg        750 mg 100 mL/hr over 90 Minutes Intravenous Every 24 hours 06/24/12 1539 07/07/12 2015   06/23/12 1000   fluconazole (DIFLUCAN) IVPB 100 mg  Status:  Discontinued        100 mg 50 mL/hr over 60 Minutes Intravenous Every 24 hours 06/22/12 1632 07/01/12 0809   06/23/12 0000   vancomycin (VANCOCIN) 1,500 mg in sodium chloride 0.9 % 500 mL IVPB  Status:  Discontinued        1,500 mg 250 mL/hr over 120 Minutes Intravenous Every 24 hours 06/22/12 1145 06/24/12 1539   06/22/12 1730   fluconazole (DIFLUCAN) IVPB 200 mg        200 mg 100 mL/hr over 60 Minutes Intravenous  Once 06/22/12 1630 06/22/12 1852   06/19/12 2200   vancomycin (VANCOCIN) IVPB 1000 mg/200 mL premix  Status:  Discontinued        1,000 mg 200 mL/hr over 60 Minutes Intravenous Every 12 hours 06/19/12  0940 06/22/12 1123   06/19/12 1000   vancomycin (VANCOCIN) 2,000 mg in sodium chloride 0.9 % 500 mL IVPB        2,000 mg 250 mL/hr over 120 Minutes Intravenous  Once 06/19/12 0940 06/19/12 1300   06/19/12 0930   ceFEPIme (MAXIPIME) 2 g in dextrose 5 % 50 mL IVPB  Status:  Discontinued        2 g 100 mL/hr over 30 Minutes Intravenous 3 times per day 06/19/12 0904 06/24/12 1539   06/04/12 1000   vancomycin (VANCOCIN) IVPB 1000 mg/200 mL premix  Status:  Discontinued        1,000 mg 200 mL/hr over 60 Minutes Intravenous Every 12 hours 06/04/12 0837 06/05/12 1045   06/03/12 1800   vancomycin (VANCOCIN) 1,500 mg in sodium chloride 0.9 % 500 mL IVPB  Status:  Discontinued        1,500 mg 250 mL/hr over 120 Minutes Intravenous Every 24 hours 06/02/12  1428 06/04/12 0837   06/02/12 1800   vancomycin (VANCOCIN) 2,000 mg in sodium chloride 0.9 % 500 mL IVPB        2,000 mg 250 mL/hr over 120 Minutes Intravenous  Once 06/02/12 1428 06/02/12 2024   06/02/12 1400   cefTAZidime (FORTAZ) 1 g in dextrose 5 % 50 mL IVPB        1 g 100 mL/hr over 30 Minutes Intravenous 3 times per day 06/02/12 1349 06/12/12 0642          Assessment/Plan: s/p Procedure(s): PERCUTANEOUS TRACHEOSTOMY PERCUTANEOUS ENDOSCOPIC GASTROSTOMY (PEG) PLACEMENT Changed to FIO2 28% and check ABG if he tolerates this very well. CPM otherwise.  LOS: 54 days   Marta Lamas. Gae Bon, MD, FACS [phone] Trauma Surgeon 07/24/2012
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
## 2012-07-25 LAB — GLUCOSE, CAPILLARY
Glucose-Capillary: 73 mg/dL (ref 70–99)
Glucose-Capillary: 78 mg/dL (ref 70–99)

## 2012-07-25 LAB — PROTIME-INR: INR: 1.92 — ABNORMAL HIGH (ref 0.00–1.49)

## 2012-07-25 MED ORDER — WARFARIN SODIUM 2.5 MG PO TABS
2.5000 mg | ORAL_TABLET | Freq: Once | ORAL | Status: AC
  Administered 2012-07-25: 2.5 mg
  Filled 2012-07-25: qty 1

## 2012-07-25 MED ORDER — CLONAZEPAM 0.5 MG PO TABS
0.5000 mg | ORAL_TABLET | Freq: Three times a day (TID) | ORAL | Status: DC
  Administered 2012-07-25 – 2012-07-30 (×17): 0.5 mg via ORAL
  Filled 2012-07-25 (×18): qty 1

## 2012-07-25 NOTE — Clinical Social Work Note (Signed)
Clinical Social Worker continuing to follow patient for support and discharge needs.  CSW spoke with patient family who would still prefer patient to be placed in the [place] area but do understand that patient may have to be placed locally.  Patient family agreeable to pay transportation costs if a facility in [place] would be able to accept patient.  [facility] [place] is able to offer a bed, however patient has MRSA PNA and would require a private room in which they do not have at this time.  Patient will not finish his course of treatment until August 04, 2012.  Patient is now out of restraints, however requires someone to be present every hour for safety and to prevent pulling at lines.  Patient is requiring suctioning every 2 hours.  CSW has completed SNF search in [place] and the surrounding counties as well as the [place] area.  No facilities with private room available at this time to accommodate patient medical needs.  Patient continues to remain on the wait list at [facility] with a possible bed opening for next week.  MD aware of patient O2 requirements for placement at both facilities.  Clinical Social Radiation protection practitioner (Dr. Jacky Kindle) who understands current situation and is understanding of patient current situation and the difficulty of a private room to accommodate patient medical needs.  Dr. Jacky Kindle suggested possibly adding Depakote to patient medication regimen for agitation.  CSW updated patient family and will continue to follow for placement needs.  Alex Foster, [place] [phone]
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
## 2012-07-25 NOTE — Progress Notes (Signed)
Increased Fio2 to 35% due to desat 84%.

## 2012-07-25 NOTE — Progress Notes (Signed)
Speech Language Pathology Dysphagia Treatment Patient Details Name: Cobain Morici MRN: [id number] DOB: [birth year] Today's Date: 07/25/2012 Time: 8295-6213 SLP Time Calculation (min): 17 min  Assessment / Plan / Recommendation Clinical Impression  Treatment focused on oropharyngeal swallow with a puree consistency only (applesauce) with Passy-Muir speaking valve in place.  Subtle indications of pharyngeal dysphagia include multiple swallows as evidence of likely residue and possible delayed swallow initiation.  Recommend FEES to fully assess swallow function and ability to initiate po's.  SLP will request order for FEES.           Diet Recommendation  Continue with Current Diet: NPO    SLP Plan FEES        Oral Cavity - Oral Hygiene Does patient have any of the following "at risk" factors?: Oxygen therapy - cannula, mask, simple oxygen devices;Saliva - thick, dry mouth Brush patient's teeth BID with toothbrush (using toothpaste with fluoride): Yes Patient is HIGH RISK - Oral Care Protocol followed (see row info): Yes   Dysphagia Treatment Treatment Methods/Modalities: Skilled observation Patient observed directly with PO's: Yes Type of PO's observed: Dysphagia 1 (puree) (applesauce) Feeding: Needs assist Liquids provided via:  (N/A) Pharyngeal Phase Signs & Symptoms: Multiple swallows;Suspected delayed swallow initiation Type of cueing: Verbal;Visual;Tactile Amount of cueing: Moderate   GO     Royce Macadamia 07/25/2012, 9:49 AM
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
## 2012-07-26 LAB — GLUCOSE, CAPILLARY
Glucose-Capillary: 129 mg/dL — ABNORMAL HIGH (ref 70–99)
Glucose-Capillary: 122 mg/dL — ABNORMAL HIGH (ref 70–99)

## 2012-07-26 LAB — PROTIME-INR: Prothrombin Time: 20.6 seconds — ABNORMAL HIGH (ref 11.6–15.2)

## 2012-07-26 MED ORDER — PRO-STAT SUGAR FREE PO LIQD
30.0000 mL | Freq: Three times a day (TID) | ORAL | Status: DC
  Administered 2012-07-26 – 2012-08-08 (×37): 30 mL
  Filled 2012-07-26 (×42): qty 30

## 2012-07-26 MED ORDER — PIVOT 1.5 CAL PO LIQD
1000.0000 mL | ORAL | Status: DC
  Administered 2012-07-26 – 2012-08-04 (×8): 1000 mL
  Filled 2012-07-26 (×18): qty 1000

## 2012-07-26 MED ORDER — WARFARIN SODIUM 2.5 MG PO TABS
2.5000 mg | ORAL_TABLET | Freq: Once | ORAL | Status: AC
  Filled 2012-07-26: qty 1

## 2012-07-26 MED ORDER — HYDROCODONE-ACETAMINOPHEN 7.5-500 MG/15ML PO SOLN
10.0000 mL | ORAL | Status: DC | PRN
  Administered 2012-07-26 – 2012-07-31 (×11): 10 mL via ORAL
  Filled 2012-07-26 (×12): qty 15

## 2012-07-26 NOTE — Progress Notes (Signed)
Nutrition Follow-up  Intervention:   1.  Enteral nutrition; Increase Pivot 1.5 to 60 mL/hr and decrease Prostat to TID to provide 2460 kcal, 180g protein, 1092 mL free water. 2.  Modify diet; modify diet if medically appropriate and safe per SLP/MD.  Per SLP note, prognosis for resume of POs in the future is currently good.  Assessment:   Wt continues to be variable, however pt no longer trending wt loss.  Pt no longer on vent support and with improved ability to communicate/express needs.  Denies nausea, abdominal pain.  Pt without concerns for nutrition at this time. Pt completed evaluation with speech therapy this am who notes good prognosis for PO intake in the future, however not safe for pt at this time.  Discharge planning ongoing.  Pt continues Pivot 1.5 @ 55 mL/hr via PEG with Prostat 4 times daily which provides 2380 kcal, 183g protein, and 1003 mL free water. Pt continues 220 mL free water flushes q 4 hrs.    Residuals: 0 mL q 4 hrs.  Diet Order:  Pivot 1.5 @ 55 mL/hr with Prostat 4 times daily.  Meds: Scheduled Meds:    . antiseptic oral rinse  15 mL Mouth Rinse QID  . bacitracin   Topical BID  . chlorhexidine  15 mL Mouth Rinse BID  . Chlorhexidine Gluconate Cloth  6 each Topical Q0600  . clonazePAM  0.5 mg Oral TID  . feeding supplement  30 mL Per Tube QID  . free water  220 mL Per Tube Q4H  . guaiFENesin  20 mL Per Tube Q4H  . insulin aspart  0-15 Units Subcutaneous Q4H  . insulin glargine  45 Units Subcutaneous Daily  . metFORMIN  500 mg Oral BID WC  . multivitamin  5 mL Per Tube Daily  . mupirocin ointment  1 application Nasal BID  . pantoprazole sodium  40 mg Per Tube Q1200  . potassium chloride  20 mEq Per Tube BID  . sodium chloride  10-40 mL Intracatheter Q12H  . vancomycin  1,500 mg Intravenous Q24H  . warfarin  2.5 mg Per Tube ONCE-1800  . warfarin  2.5 mg Per Tube ONCE-1800  . warfarin  5 mg Oral Q48H  . warfarin  7.5 mg Oral Q48H  . Warfarin -  Pharmacist Dosing Inpatient   Does not apply q1800   Continuous Infusions:    . feeding supplement (PIVOT 1.5 CAL) 1,000 mL (07/23/12 2136)   PRN Meds:.acetaminophen (TYLENOL) oral liquid 160 mg/5 mL, albuterol, bisacodyl, fentaNYL, HYDROcodone-acetaminophen, ipratropium, ondansetron (ZOFRAN) IV, sodium chloride  Labs:  CMP     Component Value Date/Time   NA 134* 07/24/2012 0500   K 3.9 07/24/2012 0500   CL 98 07/24/2012 0500   CO2 28 07/24/2012 0500   GLUCOSE 118* 07/24/2012 0500   BUN 31* 07/24/2012 0500   CREATININE 0.80 07/24/2012 0500   CALCIUM 9.1 07/24/2012 0500   PROT 5.8* 07/08/2012 0450   ALBUMIN 1.9* 07/08/2012 0450   AST 20 07/08/2012 0450   ALT 9 07/08/2012 0450   ALKPHOS 164* 07/08/2012 0450   BILITOT 0.6 07/08/2012 0450   GFRNONAA >90 07/24/2012 0500   GFRAA >90 07/24/2012 0500     Intake/Output Summary (Last 24 hours) at 07/26/12 1143 Last data filed at 07/26/12 1035  Gross per 24 hour  Intake   3255 ml  Output   2100 ml  Net   1155 ml  Last BM (10/23)  Weight Status: 208 lbs, variable  Re-estimated needs:  2340-2630 kcal, >/=178g protein, >2.3 L/day  Nutrition Dx: Inadequate oral intake, ongoing  Monitor:   1. Enteral nutrition; continued tolerance with pt meeting >90% of estimated needs. Somewhat met, pt with difficult needs assessment in light of variable wt.  RD to made adjustments to provision.  Continue immune-modulating formula as pt is likely benefiting due to increased inflammation.  RD to follow for wt trends.  2. Wt/wt change; monitor trends. Ongoing.   Loyce Dys, MS RD LDN Clinical Inpatient Dietitian Pager: [phone] Weekend/After hours pager: [phone]
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
## 2012-07-26 NOTE — Progress Notes (Signed)
UR of chart updated.  Call placed to [facility] admissions person, Elonda Husky, at [phone], ext [phone].  Left voicemail for her checking on bed availability. Await call back.

## 2012-07-26 NOTE — Progress Notes (Signed)
Received order for bedside sitter for pt from Dr. Janee Morn.  Two RNs evaluated the need and charge nurse made aware.  AC called and notified as well.
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
## 2012-07-27 LAB — GLUCOSE, CAPILLARY

## 2012-07-27 LAB — BASIC METABOLIC PANEL
BUN: 27 mg/dL — ABNORMAL HIGH (ref 6–23)
Calcium: 9 mg/dL (ref 8.4–10.5)
Creatinine, Ser: 0.76 mg/dL (ref 0.50–1.35)
GFR calc non Af Amer: >90 mL/min (ref >90)
Glucose, Bld: 113 mg/dL — ABNORMAL HIGH (ref 70–99)

## 2012-07-27 LAB — CBC
HCT: 33.1 % — ABNORMAL LOW (ref 39.0–52.0)
Hemoglobin: 10.4 g/dL — ABNORMAL LOW (ref 13.0–17.0)
MCH: 26.9 pg (ref 26.0–34.0)
MCHC: 31.4 g/dL (ref 30.0–36.0)

## 2012-07-27 MED ORDER — DEXTROSE 50 % IV SOLN
INTRAVENOUS | Status: AC
  Administered 2012-07-27: 25 mL
  Filled 2012-07-27: qty 50

## 2012-07-27 NOTE — Progress Notes (Signed)
Hypoglycemic Event  CBG: 67  Treatment: 25 mg of Dextrose 50   Symptoms: none  Follow-up CBG: Time: CBG Result: 86  Possible Reasons for Event: unknown  Comments/MD notified:    Tauheed Mcfayden, Alessandra Bevels  Remember to initiate Hypoglycemia Order Set & complete

## 2012-07-27 NOTE — Progress Notes (Signed)
Subjective: Restless night per nursing, but no acute distress.  Objective: Vital signs in last 24 hours: Temp:  [97.2 F (36.2 C)-98.1 F (36.7 C)] 97.5 F (36.4 C) (10/26 0455) Pulse Rate:  [70-94] 82  (10/26 0501) Resp:  [17-31] 17  (10/26 0501) BP: (103-164)/(50-86) 103/50 mmHg (10/26 0501) SpO2:  [89 %-98 %] 98 % (10/26 0501) FiO2 (%):  [28 %] 28 % (10/26 0501) Weight:  [213 lb 6.5 oz (96.8 kg)] 213 lb 6.5 oz (96.8 kg) (10/26 0455) Last BM Date: 07/24/12  Intake/Output from previous day: 10/25 0701 - 10/26 0700 In: 1750 [I.V.:10; NG/GT:660] Out: 1950 [Urine:1950] Intake/Output this shift:    General appearance: alert, cooperative, appears older than stated age, distracted, no distress and slowed mentation Neck: Trach on 28% TC Chest: CTA bilaterally Cardiac: RRR Abdomen: G tube tube feeds at 60 ml/hr  Abdomen soft, non tender, + BS GU: foley (1963ml/24 hr)  Lab Results:   Basename 07/27/12 0500  WBC 5.7  HGB 10.4*  HCT 33.1*  PLT 223   BMET  Basename 07/27/12 0500  NA 136  K 4.2  CL 99  CO2 30  GLUCOSE 113*  BUN 27*  CREATININE 0.76  CALCIUM 9.0   PT/INR  Basename 07/27/12 0500 07/26/12 0443  LABPROT 22.5* 20.6*  INR 2.08* 1.84*   ABG No results found for this basename: PHART:2,PCO2:2,PO2:2,HCO3:2 in the last 72 hours  Studies/Results: No results found.  Anti-infectives: Anti-infectives     Start     Dose/Rate Route Frequency Ordered Stop   07/22/12 1000   vancomycin (VANCOCIN) 1,500 mg in sodium chloride 0.9 % 500 mL IVPB        1,500 mg 250 mL/hr over 120 Minutes Intravenous Every 24 hours 07/22/12 0945 08/04/12 0809   06/24/12 1800   levofloxacin (LEVAQUIN) IVPB 750 mg        750 mg 100 mL/hr over 90 Minutes Intravenous Every 24 hours 06/24/12 1539 07/07/12 2015   06/23/12 1000   fluconazole (DIFLUCAN) IVPB 100 mg  Status:  Discontinued        100 mg 50 mL/hr over 60 Minutes Intravenous Every 24 hours 06/22/12 1632 07/01/12 0809    06/23/12 0000   vancomycin (VANCOCIN) 1,500 mg in sodium chloride 0.9 % 500 mL IVPB  Status:  Discontinued        1,500 mg 250 mL/hr over 120 Minutes Intravenous Every 24 hours 06/22/12 1145 06/24/12 1539   06/22/12 1730   fluconazole (DIFLUCAN) IVPB 200 mg        200 mg 100 mL/hr over 60 Minutes Intravenous  Once 06/22/12 1630 06/22/12 1852   06/19/12 2200   vancomycin (VANCOCIN) IVPB 1000 mg/200 mL premix  Status:  Discontinued        1,000 mg 200 mL/hr over 60 Minutes Intravenous Every 12 hours 06/19/12 0940 06/22/12 1123   06/19/12 1000   vancomycin (VANCOCIN) 2,000 mg in sodium chloride 0.9 % 500 mL IVPB        2,000 mg 250 mL/hr over 120 Minutes Intravenous  Once 06/19/12 0940 06/19/12 1300   06/19/12 0930   ceFEPIme (MAXIPIME) 2 g in dextrose 5 % 50 mL IVPB  Status:  Discontinued        2 g 100 mL/hr over 30 Minutes Intravenous 3 times per day 06/19/12 0904 06/24/12 1539   06/04/12 1000   vancomycin (VANCOCIN) IVPB 1000 mg/200 mL premix  Status:  Discontinued        1,000 mg 200 mL/hr  over 60 Minutes Intravenous Every 12 hours 06/04/12 0837 06/05/12 1045   06/03/12 1800   vancomycin (VANCOCIN) 1,500 mg in sodium chloride 0.9 % 500 mL IVPB  Status:  Discontinued        1,500 mg 250 mL/hr over 120 Minutes Intravenous Every 24 hours 06/02/12 1428 06/04/12 0837   06/02/12 1800   vancomycin (VANCOCIN) 2,000 mg in sodium chloride 0.9 % 500 mL IVPB        2,000 mg 250 mL/hr over 120 Minutes Intravenous  Once 06/02/12 1428 06/02/12 2024   06/02/12 1400   cefTAZidime (FORTAZ) 1 g in dextrose 5 % 50 mL IVPB        1 g 100 mL/hr over 30 Minutes Intravenous 3 times per day 06/02/12 1349 06/12/12 0642          Assessment/Plan: s/p Procedure(s) (LRB) with comments: PERCUTANEOUS TRACHEOSTOMY (N/A) PERCUTANEOUS ENDOSCOPIC GASTROSTOMY (PEG) PLACEMENT (N/A) [facility]  Respiratory failure -- HTC 28%, TBI w/ICC -- per NS. therapies  CVA -- per NS  Multiple facial fxs -- Non-operative  per ENT  Multiple bilateral rib fxs w/HTPX s/p bilateral CT  ID - BAL Friday > 100k MRSA, on Vanc 5/14  ABL anemia -- stable  UE DVT -- Coumadin  Urinary retention -- foley  DM -- Stable  FEN -- TF via PEG, FEES by ST planned  Dispo -- restraints and sitter D/Cd, working on placement at [facility]   LOS: 57 days    Alex Foster, Alex Foster 07/27/2012

## 2012-07-27 NOTE — Progress Notes (Signed)
No issues. Vitals stable.  FC X4 cta with decreased bs at bases abd soft, nd MAE. No edema  Cont current txt plan Cont IV abx for MRSA PNA day 6/14  Mary Sella. Andrey Campanile, MD, FACS General, Bariatric, & Minimally Invasive Surgery [facility], [place]

## 2012-07-27 NOTE — Progress Notes (Signed)
ANTICOAGULATION CONSULT NOTE - Follow Up Consult  Pharmacy Consult for Coumadin Indication: RUE DVT  No Known Allergies  Vital Signs: Temp: 98.6 F (37 C) (10/26 0807) Temp src: Oral (10/26 0807) BP: 150/78 mmHg (10/26 0807) Pulse Rate: 77  (10/26 0807)  Labs:  Basename 07/27/12 0500 07/26/12 0443 07/25/12 0455  HGB 10.4* -- --  HCT 33.1* -- --  PLT 223 -- --  APTT -- -- --  LABPROT 22.5* 20.6* 21.2*  INR 2.08* 1.84* 1.92*  HEPARINUNFRC -- -- --  CREATININE 0.76 -- --  CKTOTAL -- -- --  CKMB -- -- --  TROPONINI -- -- --    Estimated Creatinine Clearance: 111.5 ml/min (by C-G formula based on Cr of 0.76).  Assessment: 66yom continues on coumadin with a therapeutic INR after receiving an extra 2.5mg  dose yesterday. Hgb low but stable, platelets stable. No bleeding reported.  Goal of Therapy:  INR 2-3 Monitor platelets by anticoagulation protocol: Yes   Plan:  1) Continue coumadin 7.5mg  alternating with 5mg  (due for 5mg  today) 2) Continue INR checks MWF  Fredrik Rigger 07/27/2012,8:40 AM

## 2012-07-28 LAB — MAGNESIUM: Magnesium: 1.8 mg/dL (ref 1.5–2.5)

## 2012-07-28 LAB — COMPREHENSIVE METABOLIC PANEL
ALT: 14 U/L (ref 0–53)
Albumin: 2.8 g/dL — ABNORMAL LOW (ref 3.5–5.2)
Alkaline Phosphatase: 169 U/L — ABNORMAL HIGH (ref 39–117)
BUN: 25 mg/dL — ABNORMAL HIGH (ref 6–23)
Potassium: 4.6 mEq/L (ref 3.5–5.1)
Sodium: 134 mEq/L — ABNORMAL LOW (ref 135–145)
Total Protein: 7.2 g/dL (ref 6.0–8.3)

## 2012-07-28 MED ORDER — DEXTROSE 50 % IV SOLN
INTRAVENOUS | Status: AC
  Administered 2012-07-28: 50 mL
  Filled 2012-07-28: qty 50

## 2012-07-28 NOTE — Progress Notes (Signed)
Subjective: No changes.  Foley in at G tube site.  Objective: Vital signs in last 24 hours: Temp:  [97.4 F (36.3 C)-98.1 F (36.7 C)] 98.1 F (36.7 C) (10/27 0849) Pulse Rate:  [66-84] 66  (10/27 0849) Resp:  [14-31] 18  (10/27 0849) BP: (108-183)/(61-160) 108/61 mmHg (10/27 0849) SpO2:  [92 %-98 %] 96 % (10/27 0849) FiO2 (%):  [28 %] 28 % (10/27 0849) Weight:  [208 lb 12.4 oz (94.7 kg)] 208 lb 12.4 oz (94.7 kg) (10/27 0329) Last BM Date: 07/24/12  Intake/Output from previous day: 10/26 0701 - 10/27 0700 In: 2980 [NG/GT:1100; IV Piggyback:500] Out: 3100 [Urine:3100] Intake/Output this shift:    Incision/Wound:foley at G tube site clean abdomen soft   Lab Results:   Basename 07/27/12 0500  WBC 5.7  HGB 10.4*  HCT 33.1*  PLT 223   BMET  Basename 07/27/12 0500  NA 136  K 4.2  CL 99  CO2 30  GLUCOSE 113*  BUN 27*  CREATININE 0.76  CALCIUM 9.0   PT/INR  Basename 07/27/12 0500 07/26/12 0443  LABPROT 22.5* 20.6*  INR 2.08* 1.84*   ABG No results found for this basename: PHART:2,PCO2:2,PO2:2,HCO3:2 in the last 72 hours  Studies/Results: No results found.  Anti-infectives: Anti-infectives     Start     Dose/Rate Route Frequency Ordered Stop   07/22/12 1000   vancomycin (VANCOCIN) 1,500 mg in sodium chloride 0.9 % 500 mL IVPB        1,500 mg 250 mL/hr over 120 Minutes Intravenous Every 24 hours 07/22/12 0945 08/04/12 0809   06/24/12 1800   levofloxacin (LEVAQUIN) IVPB 750 mg        750 mg 100 mL/hr over 90 Minutes Intravenous Every 24 hours 06/24/12 1539 07/07/12 2015   06/23/12 1000   fluconazole (DIFLUCAN) IVPB 100 mg  Status:  Discontinued        100 mg 50 mL/hr over 60 Minutes Intravenous Every 24 hours 06/22/12 1632 07/01/12 0809   06/23/12 0000   vancomycin (VANCOCIN) 1,500 mg in sodium chloride 0.9 % 500 mL IVPB  Status:  Discontinued        1,500 mg 250 mL/hr over 120 Minutes Intravenous Every 24 hours 06/22/12 1145 06/24/12 1539   06/22/12 1730   fluconazole (DIFLUCAN) IVPB 200 mg        200 mg 100 mL/hr over 60 Minutes Intravenous  Once 06/22/12 1630 06/22/12 1852   06/19/12 2200   vancomycin (VANCOCIN) IVPB 1000 mg/200 mL premix  Status:  Discontinued        1,000 mg 200 mL/hr over 60 Minutes Intravenous Every 12 hours 06/19/12 0940 06/22/12 1123   06/19/12 1000   vancomycin (VANCOCIN) 2,000 mg in sodium chloride 0.9 % 500 mL IVPB        2,000 mg 250 mL/hr over 120 Minutes Intravenous  Once 06/19/12 0940 06/19/12 1300   06/19/12 0930   ceFEPIme (MAXIPIME) 2 g in dextrose 5 % 50 mL IVPB  Status:  Discontinued        2 g 100 mL/hr over 30 Minutes Intravenous 3 times per day 06/19/12 0904 06/24/12 1539   06/04/12 1000   vancomycin (VANCOCIN) IVPB 1000 mg/200 mL premix  Status:  Discontinued        1,000 mg 200 mL/hr over 60 Minutes Intravenous Every 12 hours 06/04/12 0837 06/05/12 1045   06/03/12 1800   vancomycin (VANCOCIN) 1,500 mg in sodium chloride 0.9 % 500 mL IVPB  Status:  Discontinued  1,500 mg 250 mL/hr over 120 Minutes Intravenous Every 24 hours 06/02/12 1428 06/04/12 0837   06/02/12 1800   vancomycin (VANCOCIN) 2,000 mg in sodium chloride 0.9 % 500 mL IVPB        2,000 mg 250 mL/hr over 120 Minutes Intravenous  Once 06/02/12 1428 06/02/12 2024   06/02/12 1400   cefTAZidime (FORTAZ) 1 g in dextrose 5 % 50 mL IVPB        1 g 100 mL/hr over 30 Minutes Intravenous 3 times per day 06/02/12 1349 06/12/12 0642          Assessment/Plan: s/p Procedure(s) (LRB) with comments: PERCUTANEOUS TRACHEOSTOMY (N/A) PERCUTANEOUS ENDOSCOPIC GASTROSTOMY (PEG) PLACEMENT (N/A)  [facility]  Respiratory failure -- HTC 28%,  TBI w/ICC -- per NS. therapies  CVA -- per NS  Multiple facial fxs -- Non-operative per ENT  Multiple bilateral rib fxs w/HTPX s/p bilateral CT  ID - BAL Friday > 100k MRSA, on Vanc 5/14  ABL anemia -- stable  UE DVT -- Coumadin  Urinary retention -- foley  DM -- Stable  FEN -- TF via  PEG, FEES by ST planned  Needs foley replaced with PEG.   Swallowing evaluation Dispo -- restraints and sitter D/Cd, working on placement at SNF   LOS: 58 days    Kalayna Noy A. 07/28/2012

## 2012-07-28 NOTE — Progress Notes (Signed)
CBG 80mg/dl 

## 2012-07-28 NOTE — Progress Notes (Signed)
CBG 64 mg/dl  1 Amp Dextrose 40% administered. We will reassess in 15 minutes. Pt awake, alert oriented to name and place.

## 2012-07-28 NOTE — Progress Notes (Signed)
Dr. Laurence Aly notified about pt having increased PR to 0.31; 2 second pause that occurred twice and other areas of missed beats. Telephone order given for CMP, MG, Phos labs.

## 2012-07-29 LAB — PROTIME-INR
INR: 1.99 — ABNORMAL HIGH (ref 0.00–1.49)
Prothrombin Time: 21.8 seconds — ABNORMAL HIGH (ref 11.6–15.2)

## 2012-07-29 LAB — GLUCOSE, CAPILLARY
Glucose-Capillary: 98 mg/dL (ref 70–99)
Glucose-Capillary: 56 mg/dL — ABNORMAL LOW (ref 70–99)

## 2012-07-29 MED ORDER — WARFARIN SODIUM 2.5 MG PO TABS
2.5000 mg | ORAL_TABLET | Freq: Once | ORAL | Status: AC
  Administered 2012-07-29: 2.5 mg
  Filled 2012-07-29: qty 1

## 2012-07-29 MED ORDER — DEXTROSE 50 % IV SOLN
INTRAVENOUS | Status: AC
  Filled 2012-07-29: qty 50

## 2012-07-29 MED ORDER — DEXTROSE 50 % IV SOLN
25.0000 mL | Freq: Once | INTRAVENOUS | Status: AC | PRN
  Administered 2012-07-29: 25 mL via INTRAVENOUS

## 2012-07-29 MED ORDER — QUETIAPINE FUMARATE 25 MG PO TABS
25.0000 mg | ORAL_TABLET | Freq: Three times a day (TID) | ORAL | Status: DC
  Administered 2012-07-29 (×3): 25 mg via ORAL
  Filled 2012-07-29 (×6): qty 1

## 2012-07-29 NOTE — Progress Notes (Signed)
Occupational Therapy Treatment Patient Details Name: Demetric Dunnaway MRN: [id number] DOB: Aug 15, 1946 Today's Date: 07/29/2012 Time: 4098-1191 OT Time Calculation (min): 32 min  OT Assessment / Plan / Recommendation Comments on Treatment Session Pt followed more simple one step commands today.  Very important to keep room dimly lit and quiet during treatment.  Pt followed many more 1 step commands when it was just pt/therapist in room.    Follow Up Recommendations  Skilled nursing facility    Barriers to Discharge       Equipment Recommendations  None recommended by OT    Recommendations for Other Services    Frequency Min 2X/week   Plan Discharge plan remains appropriate    Precautions / Restrictions Precautions Precautions: Fall Precaution Comments: L shoulder subluxation and L side hemiparesis. Restrictions Weight Bearing Restrictions: No   Pertinent Vitals/Pain Pt w no c/o pain this session.    ADL  Grooming: Performed;Brushing hair;Wash/dry face;Moderate assistance Where Assessed - Grooming: Supported sitting Transfers/Ambulation Related to ADLs: Only sat on side of bed. ADL Comments: Pt able to comb hair when handed comb.  No assist needed with follow through.  Pt stated item was comb and combed hair.  Pt did need some hand over hand assist to begin to wash face with wash cloth.  Once initiation assist given, pt was able to compete task with min cues for follow thru.    OT Diagnosis:    OT Problem List:   OT Treatment Interventions:     OT Goals Acute Rehab OT Goals OT Goal Formulation: With patient Time For Goal Achievement: 08/02/12 Potential to Achieve Goals: Good ADL Goals Pt Will Perform Grooming: with supervision;Sitting, edge of bed;Unsupported ADL Goal: Grooming - Progress: Progressing toward goals Pt Will Perform Upper Body Bathing: with min assist;Unsupported;Sitting, edge of bed Miscellaneous OT Goals Miscellaneous OT Goal #1: Pt will follow 2 step  commands related to grooming or selfcare with 75% accurracy. OT Goal: Miscellaneous Goal #1 - Progress: Progressing toward goals Miscellaneous OT Goal #2: Pt will transition from supine to sit EOB with mod assist in preparation for selfcare tasks. OT Goal: Miscellaneous Goal #2 - Progress: Progressing toward goals Miscellaneous OT Goal #3: Pt will maintain static sitting EOB with min assist for 1 minute. OT Goal: Miscellaneous Goal #3 - Progress: Progressing toward goals  Visit Information  Last OT Received On: 07/29/12 Assistance Needed: +2    Subjective Data      Prior Functioning       Cognition  Overall Cognitive Status: Impaired Area of Impairment: Attention;Following commands;Safety/judgement;Awareness of errors;Awareness of deficits;Problem solving Arousal/Alertness: Awake/alert Orientation Level: Disoriented to;Place;Time;Situation Behavior During Session: Lethargic Current Attention Level: Focused Attention - Other Comments: Pt able to focus today for moments but then was lethargic and would close eyes and fall back asleep. Following Commands: Follows one step commands with increased time Safety/Judgement: Decreased awareness of safety precautions;Decreased safety judgement for tasks assessed;Decreased awareness of need for assistance Awareness of Errors: Assistance required to identify errors made;Assistance required to correct errors made Awareness of Errors - Other Comments: Continues to be unaware of balance deficits sitting EOB and at times does not attempt to correct falling to left and needing assist.   Awareness of Deficits: Pt falls to left when sitting on side of bed.  When cued, pt would shift weight to R side and reach R arm further out to balance.  Pt needed physcial and verbal cues to be able to hold sitting for 5 minutes with S  at times to max assist at times. Problem Solving: Max cueing for all functional mobilty Cognition - Other Comments: Followed most (90%)  simple one step commands today when there was no commotion in the room.    Mobility  Shoulder Instructions Bed Mobility Bed Mobility: Supine to Sit;Sitting - Scoot to Edge of Bed Supine to Sit: 1: +2 Total assist;HOB elevated Supine to Sit: Patient Percentage: 30% Sitting - Scoot to Edge of Bed: 1: +2 Total assist Sitting - Scoot to Edge of Bed: Patient Percentage: 10% Sit to Supine: 1: +2 Total assist Sit to Supine: Patient Percentage: 0% Scooting to HOB: 1: +2 Total assist Scooting to [facility]: Patient Percentage: 0% Details for Bed Mobility Assistance: Assisted a little with supine to sit today.       Exercises  Low Level/ICU Exercises Shoulder Flexion: AROM;5 reps;Right;AAROM;Left;Supine Elbow Flexion: AAROM;Left;5 reps   Balance Static Sitting Balance Static Sitting - Balance Support: Bilateral upper extremity supported;Feet unsupported Static Sitting - Level of Assistance: 2: Max assist Static Sitting - Comment/# of Minutes: 5 minutes.  At times, pt could hold for 10 sec with S and VCs.  At other times, pt needed max assist to hold sitting.   End of Session OT - End of Session Activity Tolerance: Patient limited by fatigue Patient left: in bed;with call bell/phone within reach;with nursing in room Nurse Communication: Mobility status  GO     Hope Budds 07/29/2012, 1:13 PM [phone]

## 2012-07-29 NOTE — Progress Notes (Signed)
Subjective: Per sitter did not sleep  Objective: Vital signs in last 24 hours: Temp:  [96.9 F (36.1 C)-98.1 F (36.7 C)] 97.4 F (36.3 C) (10/28 0424) Pulse Rate:  [65-88] 88  (10/28 0723) Resp:  [14-39] 23  (10/28 0723) BP: (108-167)/(59-102) 167/73 mmHg (10/28 0413) SpO2:  [91 %-98 %] 98 % (10/28 0723) FiO2 (%):  [28 %] 28 % (10/28 0723) Weight:  [97 kg (213 lb 13.5 oz)] 97 kg (213 lb 13.5 oz) (10/28 0300) Last BM Date: 07/27/12  Intake/Output from previous day: 10/27 0701 - 10/28 0700 In: 2100 [NG/GT:220; IV Piggyback:500] Out: 1850 [Urine:1850] Intake/Output this shift:    General appearance: alert Neck: trach with yellow secretions Resp: clear to auscultation bilaterally Cardio: S1, S2 normal GI: no binder, UH reduces, soft, NT, foley via PEG site Neurologic: Mental status: Alert, oriented, thought content appropriate, mild agitation Motor: F/C with R side and LLE  Lab Results: CBC   Basename 07/27/12 0500  WBC 5.7  HGB 10.4*  HCT 33.1*  PLT 223   BMET  Basename 07/28/12 1120 07/27/12 0500  NA 134* 136  K 4.6 4.2  CL 98 99  CO2 32 30  GLUCOSE 144* 113*  BUN 25* 27*  CREATININE 0.80 0.76  CALCIUM 9.2 9.0   PT/INR  Basename 07/29/12 0502 07/27/12 0500  LABPROT 21.8* 22.5*  INR 1.99* 2.08*   ABG No results found for this basename: PHART:2,PCO2:2,PO2:2,HCO3:2 in the last 72 hours  Studies/Results: No results found.  Anti-infectives: Anti-infectives     Start     Dose/Rate Route Frequency Ordered Stop   07/22/12 1000   vancomycin (VANCOCIN) 1,500 mg in sodium chloride 0.9 % 500 mL IVPB        1,500 mg 250 mL/hr over 120 Minutes Intravenous Every 24 hours 07/22/12 0945 08/04/12 0809   06/24/12 1800   levofloxacin (LEVAQUIN) IVPB 750 mg        750 mg 100 mL/hr over 90 Minutes Intravenous Every 24 hours 06/24/12 1539 07/07/12 2015   06/23/12 1000   fluconazole (DIFLUCAN) IVPB 100 mg  Status:  Discontinued        100 mg 50 mL/hr over 60  Minutes Intravenous Every 24 hours 06/22/12 1632 07/01/12 0809   06/23/12 0000   vancomycin (VANCOCIN) 1,500 mg in sodium chloride 0.9 % 500 mL IVPB  Status:  Discontinued        1,500 mg 250 mL/hr over 120 Minutes Intravenous Every 24 hours 06/22/12 1145 06/24/12 1539   06/22/12 1730   fluconazole (DIFLUCAN) IVPB 200 mg        200 mg 100 mL/hr over 60 Minutes Intravenous  Once 06/22/12 1630 06/22/12 1852   06/19/12 2200   vancomycin (VANCOCIN) IVPB 1000 mg/200 mL premix  Status:  Discontinued        1,000 mg 200 mL/hr over 60 Minutes Intravenous Every 12 hours 06/19/12 0940 06/22/12 1123   06/19/12 1000   vancomycin (VANCOCIN) 2,000 mg in sodium chloride 0.9 % 500 mL IVPB        2,000 mg 250 mL/hr over 120 Minutes Intravenous  Once 06/19/12 0940 06/19/12 1300   06/19/12 0930   ceFEPIme (MAXIPIME) 2 g in dextrose 5 % 50 mL IVPB  Status:  Discontinued        2 g 100 mL/hr over 30 Minutes Intravenous 3 times per day 06/19/12 0904 06/24/12 1539   06/04/12 1000   vancomycin (VANCOCIN) IVPB 1000 mg/200 mL premix  Status:  Discontinued  1,000 mg 200 mL/hr over 60 Minutes Intravenous Every 12 hours 06/04/12 0837 06/05/12 1045   06/03/12 1800   vancomycin (VANCOCIN) 1,500 mg in sodium chloride 0.9 % 500 mL IVPB  Status:  Discontinued        1,500 mg 250 mL/hr over 120 Minutes Intravenous Every 24 hours 06/02/12 1428 06/04/12 0837   06/02/12 1800   vancomycin (VANCOCIN) 2,000 mg in sodium chloride 0.9 % 500 mL IVPB        2,000 mg 250 mL/hr over 120 Minutes Intravenous  Once 06/02/12 1428 06/02/12 2024   06/02/12 1400   cefTAZidime (FORTAZ) 1 g in dextrose 5 % 50 mL IVPB        1 g 100 mL/hr over 30 Minutes Intravenous 3 times per day 06/02/12 1349 06/12/12 1610          Assessment/Plan: s/p Procedure(s): PERCUTANEOUS TRACHEOSTOMY PERCUTANEOUS ENDOSCOPIC GASTROSTOMY (PEG) PLACEMENT [facility]  Respiratory failure -- HTC 28%,  TBI w/ICC -- per NS. therapies  CVA -- per NS    Multiple facial fxs -- Non-operative per ENT  Multiple bilateral rib fxs w/HTPX s/p bilateral CT  ID - MRSA tracheobronchitis Vanco 8/14  ABL anemia -- stable  UE DVT -- Coumadin - pharmacy adjusting Urinary retention -- foley  Agitaiton -- requiring sitter, adjust Klonopin, add Seroquel to try to avoid need for sitter DM -- Stable  FEN -- TF via PEG, needs binder replaced, FEES per ST ? today Dispo --  working on placement at [facility]  LOS: 59 days    Violeta Gelinas, MD, MPH, FACS Pager: [phone]  07/29/2012
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
## 2012-07-29 NOTE — Clinical Social Work Note (Signed)
Clinical Social Worker continuing to follow patient and family for support and discharge planning needs.  Patient remains on 28% trach collar with +MRSA PNA isolation status.  Patient barrier at this time with SNF placement is the need for a private room due to isolation status and availability.  CSW spoke with inpatient rehab admissions coordinator to assess patient for inpatient rehab admission - CM has submitted referrals to [facility] in order for patient to be closer to family.  Patient remains on the wait list at [facility] in the interim.    Clinical Social Worker left message with patient brother and will await call back.  CSW available for support and to facilitate patient discharge needs once medically appropriate and a bed available.  Macario Golds, [place] [phone]

## 2012-07-30 LAB — GLUCOSE, CAPILLARY
Glucose-Capillary: 139 mg/dL — ABNORMAL HIGH (ref 70–99)
Glucose-Capillary: 47 mg/dL — ABNORMAL LOW (ref 70–99)
Glucose-Capillary: 138 mg/dL — ABNORMAL HIGH (ref 70–99)
Glucose-Capillary: 77 mg/dL (ref 70–99)

## 2012-07-30 LAB — BASIC METABOLIC PANEL
BUN: 26 mg/dL — ABNORMAL HIGH (ref 6–23)
Chloride: 100 mEq/L (ref 96–112)
GFR calc Af Amer: >90 mL/min (ref >90)
Glucose, Bld: 167 mg/dL — ABNORMAL HIGH (ref 70–99)
Potassium: 4.3 mEq/L (ref 3.5–5.1)
Sodium: 137 mEq/L (ref 135–145)

## 2012-07-30 LAB — CBC
HCT: 33.7 % — ABNORMAL LOW (ref 39.0–52.0)
Hemoglobin: 10.6 g/dL — ABNORMAL LOW (ref 13.0–17.0)
WBC: 5 10*3/uL (ref 4.0–10.5)

## 2012-07-30 MED ORDER — INSULIN GLARGINE 100 UNIT/ML ~~LOC~~ SOLN
35.0000 [IU] | Freq: Every day | SUBCUTANEOUS | Status: DC
  Administered 2012-07-30 – 2012-08-08 (×10): 35 [IU] via SUBCUTANEOUS

## 2012-07-30 MED ORDER — QUETIAPINE FUMARATE 50 MG PO TABS
50.0000 mg | ORAL_TABLET | Freq: Three times a day (TID) | ORAL | Status: DC
  Administered 2012-07-30 (×3): 50 mg via ORAL
  Filled 2012-07-30 (×6): qty 1

## 2012-07-30 MED ORDER — DEXTROSE 50 % IV SOLN
INTRAVENOUS | Status: AC
  Administered 2012-07-30: 25 mL
  Filled 2012-07-30: qty 50

## 2012-07-30 MED ORDER — DEXTROSE 50 % IV SOLN
50.0000 mL | Freq: Once | INTRAVENOUS | Status: AC | PRN
  Administered 2012-07-30: 50 mL via INTRAVENOUS

## 2012-07-30 MED ORDER — DEXTROSE 50 % IV SOLN
INTRAVENOUS | Status: AC
  Filled 2012-07-30: qty 50

## 2012-07-30 NOTE — Progress Notes (Signed)
CBG now 77.  NSL now infiltrated.  No veins visible, will call IV team to restart. Continue to watch.  No more diaphoresis
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
## 2012-07-30 NOTE — Clinical Social Work Note (Signed)
Clinical Social Worker continuing to follow patient for support and discharge planning needs.  CSW spoke with patient brother over the phone to discuss patient Medicare needs for the first of the year and further discuss patient discharge plans.  Patient brother is continuing to pursue patient financial needs.  Patient brother remains open with patient discharge plans, but hopeful for placement in the [place] area.  Patient treatment for +MRSA PNA will be complete 11/3 and Jetty Peeks in [place] would be willing to further look at patient for admission at that time.    Clinical Social Worker will continue to follow up with patient family for support and to facilitate patient discharge needs.  Macario Golds, [place] [phone]

## 2012-07-30 NOTE — Progress Notes (Signed)
Passy-Muir Speaking Valve - Treatment Patient Details  Name: Alex Foster MRN: [id number] Date of Birth: 03-Jul-1946  Today's Date: 07/30/2012 Time: 5284-1324 SLP Time Calculation (min): 14 min  Past Medical History:  Past Medical History  Diagnosis Date  . Myocardial infarction   . Hypertension   . Diabetes mellitus    Past Surgical History: History reviewed. No pertinent past surgical history.  Assessment / Plan / Recommendation Clinical Impression  Pt. seen for cognitive and speech facilitation utilizing passy-muir speaking valve.  Pt. with copious secretions this am and expectorating through trach with valve doffed and to oral cavity with valve donned.  SLP attempted dysphagia treatment with effortful swallow and high pitched sustained "ah" for increased pharyngeal peristalis and laryngeal elevation.  Pt. able to achieve one repetition of each exercise before continuous cough to clear mucous prevented pt. from continuing.  Pt. was not oriented to month and was not able to participate in further therapy due to copious secretions.  All vital signs stable.  RN in room and suctioned pt. at end of session.    Plan  Continue with current plan of care    Follow Up Recommendations  Inpatient Rehab        SLP Goals Potential to Achieve Goals: Good Potential Considerations: Severity of impairments SLP Goal #1: Pt. will verbalize in 3-4 word phrases with deep inhalation with max verbal/visual/tactile cues with vital signs stable. SLP Goal #1 - Progress: Progressing toward goal SLP Goal #2: Pt. will follow 1 step commands with 90% accuracy. SLP Goal #3: Pt. will increase spatial and situational orientation via yes/no head gestures with 75% with mod environmental cues.  SLP Goal #3 - Progress: Progressing toward goal   PMSV Trial  PMSV was placed for: 14 min Able to redirect subglottic air through upper airway: Yes Able to Attain Phonation: Yes Voice Quality: Hoarse Able to  Expectorate Secretions: Yes Level of Secretion Expectoration with PMSV: Oral (oral with valve donned) Breath Support for Phonation: Moderately decreased Intelligibility: Intelligibility reduced Word: 75-100% accurate Phrase: 75-100% accurate Sentence: 75-100% accurate Respirations During Trial: 23  SpO2 During Trial: 95 % Pulse During Trial: 81  Behavior: Alert;Confused;Poor eye contact   Tracheostomy Tube  Additional Tracheostomy Tube Assessment Level of Secretion Expectoration: Oral (oral with PMSV donned)    Vent Dependency  FiO2 (%): 28 %    Cuff Deflation Trial      Breck Coons Lummie Montijo M.Ed CCC-SLP Pager [phone]  07/30/2012  Behavior: Confused;Alert;Poor eye contact

## 2012-07-30 NOTE — Progress Notes (Addendum)
Nutrition Follow-up  Intervention:   1.  Enteral nutrition; continue current regimen which provides 2460 kcal, 180g protein, and 1092 mL free water.  Continuous feeds remain appropriate for pt.  Pt unable to state preference of long-term continuous vs. Bolus feeds at this time while nutrition support remains primary route of nutrition provision.  Assessment:   Pt confused, sitter at bedside.  Note ongoing plans for d/c to SNF on medically appropriate.     Pt continues with Pivot 1.5 @ 60 mL/hr and Prostat TID to provide 2460 kcal, 180g protein, and 1092 mL free water which provides 100% estimated kcal and protein needs.  Residuals: 20 mL Free water flushes: 220 mL q 4 hrs for additional 1320 mL/day.  Pt with ongoing therapy with SLP for PMSV and dysphagia.  Pt with increased secretions limited participation today, and is currently not appropriate for diet advancement.  Pt with hypoglycemic event overnight.  Lantus has been decreased to 35 units.    Diet Order:  Inadequate oral intake, ongoing.  Meds: Scheduled Meds:   . antiseptic oral rinse  15 mL Mouth Rinse QID  . bacitracin   Topical BID  . chlorhexidine  15 mL Mouth Rinse BID  . clonazePAM  0.5 mg Oral TID  . dextrose      . dextrose      . feeding supplement  30 mL Per Tube TID WC  . free water  220 mL Per Tube Q4H  . guaiFENesin  20 mL Per Tube Q4H  . insulin aspart  0-15 Units Subcutaneous Q4H  . insulin glargine  35 Units Subcutaneous Daily  . metFORMIN  500 mg Oral BID WC  . multivitamin  5 mL Per Tube Daily  . pantoprazole sodium  40 mg Per Tube Q1200  . potassium chloride  20 mEq Per Tube BID  . QUEtiapine  50 mg Oral TID  . sodium chloride  10-40 mL Intracatheter Q12H  . vancomycin  1,500 mg Intravenous Q24H  . warfarin  2.5 mg Per Tube ONCE-1800  . warfarin  5 mg Oral Q48H  . warfarin  7.5 mg Oral Q48H  . Warfarin - Pharmacist Dosing Inpatient   Does not apply q1800  . DISCONTD: insulin glargine  45 Units  Subcutaneous Daily  . DISCONTD: QUEtiapine  25 mg Oral TID   Continuous Infusions:   . feeding supplement (PIVOT 1.5 CAL) 1,000 mL (07/29/12 2209)   PRN Meds:.acetaminophen (TYLENOL) oral liquid 160 mg/5 mL, albuterol, bisacodyl, dextrose, dextrose, fentaNYL, HYDROcodone-acetaminophen, ipratropium, ondansetron (ZOFRAN) IV, sodium chloride   CMP     Component Value Date/Time   NA 137 07/30/2012 0720   K 4.3 07/30/2012 0720   CL 100 07/30/2012 0720   CO2 28 07/30/2012 0720   GLUCOSE 167* 07/30/2012 0720   BUN 26* 07/30/2012 0720   CREATININE 0.75 07/30/2012 0720   CALCIUM 8.9 07/30/2012 0720   PROT 7.2 07/28/2012 1120   ALBUMIN 2.8* 07/28/2012 1120   AST 26 07/28/2012 1120   ALT 14 07/28/2012 1120   ALKPHOS 169* 07/28/2012 1120   BILITOT 0.6 07/28/2012 1120   GFRNONAA >90 07/30/2012 0720   GFRAA >90 07/30/2012 0720    CBG (last 3)   Basename 07/30/12 1202 07/30/12 0808 07/30/12 0501  GLUCAP 138* 154* 103*     Intake/Output Summary (Last 24 hours) at 07/30/12 1228 Last data filed at 07/30/12 1100  Gross per 24 hour  Intake   1260 ml  Output   3950 ml  Net  -  2690 ml    Weight Status:  208-216 lbs, slightly variable but overall stable  Re-estimated needs:  2340-2630 kcal, >/=178g protein  Nutrition Dx:  Inadequate oral intake, ongoing  Monitor:   1. Enteral nutrition; continued tolerance with pt meeting >90% of estimated needs. Met, pt tolerating increased rate.  Continue. 2. Wt/wt change; monitor trends. Ongoing. 3.  Blood glucose; CBGs 80-150 mg/dL.   Loyce Dys, MS RD LDN Clinical Inpatient Dietitian Pager: [phone] Weekend/After hours pager: [phone]
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
## 2012-07-30 NOTE — Progress Notes (Signed)
Pt reviewed in hospital LOS meeting today.  
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
## 2012-07-30 NOTE — Progress Notes (Signed)
CBG 72.  Pt diaphoretic medicated with half amp of dextrose 50%.  Will continue to monitor

## 2012-07-31 ENCOUNTER — Other Ambulatory Visit: Payer: Self-pay

## 2012-07-31 ENCOUNTER — Inpatient Hospital Stay (HOSPITAL_COMMUNITY): Payer: Medicaid Other

## 2012-07-31 LAB — BLOOD GAS, ARTERIAL
Acid-Base Excess: 2.6 mmol/L — ABNORMAL HIGH (ref 0.0–2.0)
O2 Saturation: 96.2 %
Patient temperature: 98.6
TCO2: 28.9 mmol/L (ref 0–100)

## 2012-07-31 LAB — GLUCOSE, CAPILLARY
Glucose-Capillary: 116 mg/dL — ABNORMAL HIGH (ref 70–99)
Glucose-Capillary: 106 mg/dL — ABNORMAL HIGH (ref 70–99)
Glucose-Capillary: 111 mg/dL — ABNORMAL HIGH (ref 70–99)
Glucose-Capillary: 64 mg/dL — ABNORMAL LOW (ref 70–99)
Glucose-Capillary: 66 mg/dL — ABNORMAL LOW (ref 70–99)
Glucose-Capillary: 67 mg/dL — ABNORMAL LOW (ref 70–99)
Glucose-Capillary: 79 mg/dL (ref 70–99)
Glucose-Capillary: 80 mg/dL (ref 70–99)
Glucose-Capillary: 84 mg/dL (ref 70–99)
Glucose-Capillary: 131 mg/dL — ABNORMAL HIGH (ref 70–99)
Glucose-Capillary: 137 mg/dL — ABNORMAL HIGH (ref 70–99)

## 2012-07-31 LAB — TROPONIN I: Troponin I: <0.3 ng/mL (ref <0.30)

## 2012-07-31 LAB — BASIC METABOLIC PANEL
Calcium: 8.7 mg/dL (ref 8.4–10.5)
Creatinine, Ser: 1.01 mg/dL (ref 0.50–1.35)
GFR calc Af Amer: 89 mL/min — ABNORMAL LOW (ref >90)
GFR calc non Af Amer: 77 mL/min — ABNORMAL LOW (ref >90)
Sodium: 139 mEq/L (ref 135–145)

## 2012-07-31 LAB — CBC
MCH: 26.7 pg (ref 26.0–34.0)
MCV: 87.3 fL (ref 78.0–100.0)
Platelets: 192 10*3/uL (ref 150–400)
RDW: 16.2 % — ABNORMAL HIGH (ref 11.5–15.5)

## 2012-07-31 LAB — PROTIME-INR: INR: 2.18 — ABNORMAL HIGH (ref 0.00–1.49)

## 2012-07-31 MED ORDER — CLONAZEPAM 1 MG PO TABS
1.0000 mg | ORAL_TABLET | Freq: Two times a day (BID) | ORAL | Status: DC
  Administered 2012-07-31 – 2012-08-08 (×16): 1 mg via ORAL
  Filled 2012-07-31 (×4): qty 1
  Filled 2012-07-31: qty 2
  Filled 2012-07-31 (×3): qty 1
  Filled 2012-07-31: qty 2
  Filled 2012-07-31 (×3): qty 1
  Filled 2012-07-31 (×2): qty 2
  Filled 2012-07-31 (×3): qty 1

## 2012-07-31 MED ORDER — WARFARIN SODIUM 5 MG PO TABS
5.0000 mg | ORAL_TABLET | ORAL | Status: DC
  Filled 2012-07-31: qty 1

## 2012-07-31 MED ORDER — QUETIAPINE FUMARATE 50 MG PO TABS
75.0000 mg | ORAL_TABLET | Freq: Three times a day (TID) | ORAL | Status: DC
  Administered 2012-08-01: 75 mg via ORAL
  Filled 2012-07-31 (×4): qty 1

## 2012-07-31 MED ORDER — WARFARIN SODIUM 7.5 MG PO TABS
7.5000 mg | ORAL_TABLET | ORAL | Status: DC
  Administered 2012-07-31 – 2012-08-04 (×5): 7.5 mg via ORAL
  Filled 2012-07-31 (×6): qty 1

## 2012-07-31 MED ORDER — QUETIAPINE FUMARATE 100 MG PO TABS
100.0000 mg | ORAL_TABLET | Freq: Three times a day (TID) | ORAL | Status: DC
  Administered 2012-07-31: 100 mg via ORAL
  Filled 2012-07-31 (×3): qty 1

## 2012-07-31 MED ORDER — SODIUM CHLORIDE 0.9 % IV BOLUS (SEPSIS)
1000.0000 mL | Freq: Once | INTRAVENOUS | Status: AC
  Administered 2012-07-31: 1000 mL via INTRAVENOUS

## 2012-07-31 MED ORDER — ATROPINE SULFATE 1 MG/ML IJ SOLN
0.5000 mg | INTRAMUSCULAR | Status: DC | PRN
  Filled 2012-07-31: qty 0.5

## 2012-07-31 NOTE — Progress Notes (Signed)
SLP has reviewed and agrees with student's note below.  Lyna Laningham Willis Berkleigh Beckles M.Ed CCC-SLP Pager [phone]  07/31/2012  

## 2012-07-31 NOTE — Progress Notes (Signed)
Pt remains lethargic but arousable.  Heart rate continues to drop into 50s for short intervals and then return to 60s.  Appears irregular but SR with Heart block noted on EKG.  CXR shows no change and lab results with not critical values at this time.  Awaiting Troponin value.  Will monitor.
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
## 2012-07-31 NOTE — Progress Notes (Signed)
Passy-Muir Speaking Valve - Treatment Patient Details  Name: Alex Foster MRN: [id number] Date of Birth: 06-Sep-1946  Today's Date: 07/31/2012 Time: 0205-0213 SLP Time Calculation (min): 8 min  Past Medical History:  Past Medical History  Diagnosis Date  . Myocardial infarction   . Hypertension   . Diabetes mellitus    Past Surgical History: History reviewed. No pertinent past surgical history.  Assessment / Plan / Recommendation Clinical Impression  Pt. seen for cognitive/speech facilitation utilizing Passy-Muir Speaking Valve. Due to recent medication, Pt. extremely lethargic this afternoon, requiring total verbal/tactile/visual cues to arouse. Pt. is pale and clammy, able to open eyes for 3-4 seconds. Sitter reported low BP earlier while up in chair. Valve donned for 30 seconds .Pt. attempted to initiate verbalization however, unable to maintain alertness. Will continue ST efforts.    Plan       Follow Up Recommendations  Skilled Nursing facility        SLP Goals Potential to Achieve Goals: Good Potential Considerations: Severity of impairments Progress/Goals/Alternative treatment plan discussed with pt/caregiver and they: Patient unable to parrticipate in goal setting SLP Goal #1: Pt. will verbalize in 3-4 word phrases with deep inhalation with max verbal/visual/tactile cues with vital signs stable. SLP Goal #1 - Progress: Progressing toward goal SLP Goal #2: Pt. will follow 1 step commands with 90% accuracy. SLP Goal #2 - Progress: Progressing toward goal SLP Goal #3: Pt. will increase spatial and situational orientation via yes/no head gestures with 75% with mod environmental cues.  SLP Goal #3 - Progress: Progressing toward goal   PMSV Trial  PMSV was placed for: 30 seconds Able to redirect subglottic air through upper airway: No Able to Attain Phonation: No Able to Expectorate Secretions: No Breath Support for Phonation: Severely decreased Intelligibility:  Unable to assess (comment) Word: Other (comment) (No vocalizations this session) Respirations During Trial: 18  SpO2 During Trial: 95 % Pulse During Trial: 61  Behavior: Other (comment) (lethargic)   Tracheostomy Tube       Vent Dependency  FiO2 (%): 28 %    Cuff Deflation Trial  GO         Alex Foster 07/31/2012, 3:10 PM

## 2012-07-31 NOTE — Progress Notes (Signed)
Occupational Therapy Treatment Patient Details Name: Alex Foster MRN: [id number] DOB: June 19, 1946 Today's Date: 07/31/2012 Time: 0102-7253 OT Time Calculation (min): 25 min  OT Assessment / Plan / Recommendation Comments on Treatment Session Pt with lethargy today after not sleeping last night per RN.  Limited ability to remain alert in order to participate.  Responded favorable to quiet environment due to distracibility.    Follow Up Recommendations  Skilled nursing facility    Barriers to Discharge       Equipment Recommendations  None recommended by PT    Recommendations for Other Services    Frequency Min 2X/week   Plan Discharge plan remains appropriate    Precautions / Restrictions Precautions Precautions: Fall Precaution Comments: L shoulder subluxation and L side hemiparesis. Restrictions Weight Bearing Restrictions: No   Pertinent Vitals/Pain Premedicated for pain, no indications of pain    ADL  Grooming: Performed;Brushing hair;Wash/dry face;Moderate assistance Where Assessed - Grooming: Supported sitting Transfers/Ambulation Related to ADLs: Pt already up in chair. ADL Comments: Pt up in chair in supine.  Sat pt upright in attempt to work on sitting balance and to rouse pt.  Pt, per nurse, did not sleep last night and also received pain meds prior to OTs arrival.      OT Diagnosis:    OT Problem List:   OT Treatment Interventions:     OT Goals Acute Rehab OT Goals Time For Goal Achievement: 08/02/12 Potential to Achieve Goals: Good ADL Goals Pt Will Perform Grooming: with supervision;Sitting, edge of bed;Unsupported ADL Goal: Grooming - Progress: Not met Pt Will Perform Upper Body Bathing: with min assist;Unsupported;Sitting, edge of bed Miscellaneous OT Goals Miscellaneous OT Goal #1: Pt will follow 2 step commands related to grooming or selfcare with 75% accurracy. OT Goal: Miscellaneous Goal #1 - Progress: Not met Miscellaneous OT Goal #2: Pt will  transition from supine to sit EOB with mod assist in preparation for selfcare tasks. Miscellaneous OT Goal #3: Pt will maintain static sitting EOB with min assist for 1 minute. OT Goal: Miscellaneous Goal #3 - Progress: Progressing toward goals  Visit Information  Last OT Received On: 07/31/12 Assistance Needed: +2    Subjective Data      Prior Functioning       Cognition  Overall Cognitive Status: Impaired Area of Impairment: Attention;Following commands;Safety/judgement;Awareness of errors;Awareness of deficits;Problem solving;Rancho level Arousal/Alertness: Lethargic Orientation Level: Disoriented to;Place;Time;Situation Behavior During Session: Lethargic Current Attention Level: Focused Attention - Other Comments: Attending for 10-20 seconds at best due to lethargy. Memory Deficits: Difficulty with short and long term memory recall. Following Commands: Follows one step commands inconsistently;Follows one step commands with increased time Safety/Judgement: Decreased awareness of safety precautions;Decreased safety judgement for tasks assessed;Impulsive;Decreased awareness of need for assistance Safety/Judgement - Other Comments: Pt restlessly pulling at lines/leads throughout session with constant cues to redirect. Awareness of Errors: Assistance required to identify errors made;Assistance required to correct errors made Awareness of Errors - Other Comments: Continues to be unaware of deficits as well as need for assist.  Awareness of Deficits: falls to L when seated at edge of chair unsupported.  Righted himself toward R with verbal cues and use of R hand on arm of chair.  Sitter reports pt repeatedly repositioning himself while up in chair. Problem Solving: Max cueing for all functional mobilty    Mobility  Shoulder Instructions Bed Mobility Bed Mobility: Not assessed .        Exercises      Balance Balance Balance Assessed:  Yes Static Sitting Balance Static Sitting  - Balance Support: Right upper extremity supported;Feet supported Static Sitting - Level of Assistance: 4: Min assist Static Sitting - Comment/# of Minutes: 5   End of Session OT - End of Session Activity Tolerance: Patient limited by fatigue Patient left: in chair;with call bell/phone within reach;Other (comment) (sitter in room) Nurse Communication: Other (comment) (pts limited ability to participate)  GO     Evern Bio 07/31/2012, 12:21 PM [phone]

## 2012-07-31 NOTE — Progress Notes (Signed)
Trauma Service Note  Subjective: Patient is agitated but will follow commands.  Objective: Vital signs in last 24 hours: Temp:  [97 F (36.1 C)-98.1 F (36.7 C)] 97.5 F (36.4 C) (10/30 0334) Pulse Rate:  [74-93] 77  (10/30 0400) Resp:  [18-31] 23  (10/30 0400) BP: (126-187)/(63-102) 166/88 mmHg (10/30 0400) SpO2:  [95 %-99 %] 97 % (10/30 0730) FiO2 (%):  [28 %] 28 % (10/30 0730) Weight:  [94.4 kg (208 lb 1.8 oz)] 94.4 kg (208 lb 1.8 oz) (10/30 0451) Last BM Date: 07/27/12  Intake/Output from previous day: 10/29 0701 - 10/30 0700 In: 1770 [I.V.:10; NG/GT:440] Out: 2500 [Urine:2500] Intake/Output this shift:    General: No acute distress, just moving about constantly.  Lungs: Clear to auscultation.  Oxygen saturations good on 28% FIO2  Abd: soft, good bowel sounds.  Extremities: Intact without change  Neuro: Agitated constantly.  Lab Results: CBC   Basename 07/30/12 0720  WBC 5.0  HGB 10.6*  HCT 33.7*  PLT 207   BMET  Basename 07/30/12 0720 07/28/12 1120  NA 137 134*  K 4.3 4.6  CL 100 98  CO2 28 32  GLUCOSE 167* 144*  BUN 26* 25*  CREATININE 0.75 0.80  CALCIUM 8.9 9.2   PT/INR  Basename 07/31/12 0505 07/29/12 0502  LABPROT 23.3* 21.8*  INR 2.18* 1.99*   ABG No results found for this basename: PHART:2,PCO2:2,PO2:2,HCO3:2 in the last 72 hours  Studies/Results: No results found.  Anti-infectives: Anti-infectives     Start     Dose/Rate Route Frequency Ordered Stop   07/22/12 1000   vancomycin (VANCOCIN) 1,500 mg in sodium chloride 0.9 % 500 mL IVPB        1,500 mg 250 mL/hr over 120 Minutes Intravenous Every 24 hours 07/22/12 0945 08/04/12 0809   06/24/12 1800   levofloxacin (LEVAQUIN) IVPB 750 mg        750 mg 100 mL/hr over 90 Minutes Intravenous Every 24 hours 06/24/12 1539 07/07/12 2015   06/23/12 1000   fluconazole (DIFLUCAN) IVPB 100 mg  Status:  Discontinued        100 mg 50 mL/hr over 60 Minutes Intravenous Every 24 hours  06/22/12 1632 07/01/12 0809   06/23/12 0000   vancomycin (VANCOCIN) 1,500 mg in sodium chloride 0.9 % 500 mL IVPB  Status:  Discontinued        1,500 mg 250 mL/hr over 120 Minutes Intravenous Every 24 hours 06/22/12 1145 06/24/12 1539   06/22/12 1730   fluconazole (DIFLUCAN) IVPB 200 mg        200 mg 100 mL/hr over 60 Minutes Intravenous  Once 06/22/12 1630 06/22/12 1852   06/19/12 2200   vancomycin (VANCOCIN) IVPB 1000 mg/200 mL premix  Status:  Discontinued        1,000 mg 200 mL/hr over 60 Minutes Intravenous Every 12 hours 06/19/12 0940 06/22/12 1123   06/19/12 1000   vancomycin (VANCOCIN) 2,000 mg in sodium chloride 0.9 % 500 mL IVPB        2,000 mg 250 mL/hr over 120 Minutes Intravenous  Once 06/19/12 0940 06/19/12 1300   06/19/12 0930   ceFEPIme (MAXIPIME) 2 g in dextrose 5 % 50 mL IVPB  Status:  Discontinued        2 g 100 mL/hr over 30 Minutes Intravenous 3 times per day 06/19/12 0904 06/24/12 1539   06/04/12 1000   vancomycin (VANCOCIN) IVPB 1000 mg/200 mL premix  Status:  Discontinued  1,000 mg 200 mL/hr over 60 Minutes Intravenous Every 12 hours 06/04/12 0837 06/05/12 1045   06/03/12 1800   vancomycin (VANCOCIN) 1,500 mg in sodium chloride 0.9 % 500 mL IVPB  Status:  Discontinued        1,500 mg 250 mL/hr over 120 Minutes Intravenous Every 24 hours 06/02/12 1428 06/04/12 0837   06/02/12 1800   vancomycin (VANCOCIN) 2,000 mg in sodium chloride 0.9 % 500 mL IVPB        2,000 mg 250 mL/hr over 120 Minutes Intravenous  Once 06/02/12 1428 06/02/12 2024   06/02/12 1400   cefTAZidime (FORTAZ) 1 g in dextrose 5 % 50 mL IVPB        1 g 100 mL/hr over 30 Minutes Intravenous 3 times per day 06/02/12 1349 06/12/12 0642          Assessment/Plan: s/p Procedure(s): PERCUTANEOUS TRACHEOSTOMY PERCUTANEOUS ENDOSCOPIC GASTROSTOMY (PEG) PLACEMENT Increase Seroquel and increase Klonopin.  LOS: 61 days   Marta Lamas. Gae Bon, MD, FACS [phone] Trauma  Surgeon 07/31/2012

## 2012-08-01 DIAGNOSIS — J209 Acute bronchitis, unspecified: Secondary | ICD-10-CM

## 2012-08-01 DIAGNOSIS — S069X9A Unspecified intracranial injury with loss of consciousness of unspecified duration, initial encounter: Secondary | ICD-10-CM

## 2012-08-01 DIAGNOSIS — S2249XA Multiple fractures of ribs, unspecified side, initial encounter for closed fracture: Secondary | ICD-10-CM

## 2012-08-01 LAB — GLUCOSE, CAPILLARY
Glucose-Capillary: 114 mg/dL — ABNORMAL HIGH (ref 70–99)
Glucose-Capillary: 134 mg/dL — ABNORMAL HIGH (ref 70–99)

## 2012-08-01 MED ORDER — OXYCODONE HCL 5 MG/5ML PO SOLN
10.0000 mg | Freq: Four times a day (QID) | ORAL | Status: DC
  Administered 2012-08-01 – 2012-08-08 (×22): 10 mg via ORAL
  Filled 2012-08-01 (×2): qty 10
  Filled 2012-08-01: qty 5
  Filled 2012-08-01 (×6): qty 10
  Filled 2012-08-01: qty 5
  Filled 2012-08-01 (×6): qty 10
  Filled 2012-08-01 (×2): qty 5
  Filled 2012-08-01 (×7): qty 10

## 2012-08-01 NOTE — Clinical Social Work Note (Signed)
Clinical Social Worker continuing to follow patient and patient family for support and discharge planning needs.  CSW spoke with [facility] in [place], [place].  Facility is willing to look at patient referral again once he is no longer actively being treated for +MRSA PNA - PA aware and will document on Monday due to patient treatment course being stopped 11/3.  CSW will reinitiate referral on Monday.  If [facility] is accepted by [facility] he may be able to discharge Tuesday.  Patient family aware that the cost of transportation would be their responsibility.  Clinical Social Worker will remain available for support and to facilitate patient discharge needs.  Macario Golds, [place] [phone]
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
## 2012-08-01 NOTE — Progress Notes (Signed)
SLP has reviewed and agrees with student's note below.  Smera Guyette Willis Khari Mally M.Ed CCC-SLP Pager [phone]  08/01/2012  
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
## 2012-08-01 NOTE — Progress Notes (Signed)
Speech Language Pathology Treatment Patient Details Name: Alex Foster MRN: [id number] DOB: 1946-09-30 Today's Date: 08/01/2012 Time:  -     Assessment / Plan / Recommendation Clinical Impression  Pt. seen for cognitive/speech facilitation utilizing Passy- Muir Speaking Valve (donned for 20 minutes). Pt.'s LOA has significantly improved today, requiring mod-max verbal/tactile/visual cues. Pt. speaking at sentence level today with increased respiratory support for longer utterances. Pt. answered biographical/environmental questions with 50% accuracy however, not oriented to person (repeatedly stated his name was "[name]") or day.  Speech and cognitive goals will be revised today as pt.'s has met 3/3 goals. Will f/u to continue to facilitate speech/ cognition.      SLP Plan  Continue with current plan of care       SLP Goals  SLP Goals Potential to Achieve Goals: Good Potential Considerations: Severity of impairments Progress/Goals/Alternative treatment plan discussed with pt/caregiver and they: Patient unable to parrticipate in goal setting SLP Goal #1: Pt. will verbalize in 3-4 word phrases with deep inhalation with max verbal/visual/tactile cues with vital signs stable. SLP Goal #2: Pt. will follow 1 step commands with 90% accuracy. SLP Goal #3: Pt. seen for cognitive/speech facilitation utilizing Passy- Muir Speaking Valve (donned for 20 minutes). Pt.'s LOA has significantly improved today, requiring mod-max verbal/tactile/visual cues. Pt. speaking multi-word utterances today with improved phonation. Pt. answered biographical/environmental questions with 50% accuracy however, not oriented to person or day. Pt. tolerated cuff deflation, SLP will request order to keep cuff deflated.  Speech and cognitive goals will be revised today as pt.'s has met 3/3 goals. Will f/u tomorrow to continue addressing speech/ cognitive deficits.   SLP Goal #4: Pt. will improve  biographical/temporal/spatial orientation using environmental cues with 90% accuracy.  SLP Goal #5: Pt. will demonstrate functional problem solving with maximal verbal/visual/tactile cueing.   General Respiratory Status: Trach Behavior/Cognition: Alert;Cooperative;Requires cueing;Pleasant mood Oral Cavity - Dentition: Edentulous  Oral Cavity - Oral Hygiene Brush patient's teeth BID with toothbrush (using toothpaste with fluoride): Yes Patient is HIGH RISK - Oral Care Protocol followed (see row info): Yes Patient is AT RISK - Oral Care Protocol followed (see row info): Yes Patient is mechanically ventilated, follow VAP prevention protocol for oral care: Oral care provided every 4 hours   Treatment Treatment focused on: Cognition;Breath support Skilled Treatment: cueing hierarchy, open-ended questions, mod-max verbal/visual/tactile cueing   GO     Theotis Burrow 08/01/2012, 2:43 PM

## 2012-08-02 DIAGNOSIS — R339 Retention of urine, unspecified: Secondary | ICD-10-CM

## 2012-08-02 DIAGNOSIS — J189 Pneumonia, unspecified organism: Secondary | ICD-10-CM

## 2012-08-02 LAB — GLUCOSE, CAPILLARY
Glucose-Capillary: 81 mg/dL (ref 70–99)
Glucose-Capillary: 125 mg/dL — ABNORMAL HIGH (ref 70–99)
Glucose-Capillary: 62 mg/dL — ABNORMAL LOW (ref 70–99)

## 2012-08-02 LAB — TROPONIN I: Troponin I: <0.3 ng/mL (ref <0.30)

## 2012-08-02 LAB — PROTIME-INR: INR: 2.29 — ABNORMAL HIGH (ref 0.00–1.49)

## 2012-08-02 MED ORDER — BETHANECHOL CHLORIDE 25 MG PO TABS
25.0000 mg | ORAL_TABLET | Freq: Four times a day (QID) | ORAL | Status: DC
  Administered 2012-08-02 – 2012-08-08 (×24): 25 mg via ORAL
  Filled 2012-08-02 (×29): qty 1

## 2012-08-02 MED ORDER — TAMSULOSIN HCL 0.4 MG PO CAPS
0.8000 mg | ORAL_CAPSULE | Freq: Every day | ORAL | Status: DC
  Administered 2012-08-02 – 2012-08-08 (×6): 0.8 mg via ORAL
  Filled 2012-08-02 (×7): qty 2

## 2012-08-02 MED ORDER — BETHANECHOL 1 MG/ML PEDIATRIC ORAL SUSPENSION
25.0000 mg | Freq: Four times a day (QID) | ORAL | Status: DC
  Filled 2012-08-02 (×4): qty 25

## 2012-08-02 NOTE — Progress Notes (Signed)
Pt transferred via bed on monitor and O2 5 L via Trach collar  to room 4N23. Pt tolerated transfer.

## 2012-08-02 NOTE — Progress Notes (Signed)
Patient ID: Alex Foster, male   DOB: 1945-11-01, 66 y.o.   MRN: [id number] Called by RN that g tube is clogged.  I removed it and replaced it with a 74fr foley.  Good flush and draw of gastric content. Secured with respiratory tape. He tolerated it well. Violeta Gelinas, MD, MPH, FACS Pager: [phone]

## 2012-08-02 NOTE — Progress Notes (Signed)
During my exam he C/O chest pain.  Mid chest, nontender.  No significant arrhythmia.  Will check troponin. Patient examined and I agree with the assessment and plan  Violeta Gelinas, MD, MPH, FACS Pager: [phone]  08/02/2012 8:40 AM
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
## 2012-08-02 NOTE — Progress Notes (Signed)
Dr. Janee Morn made aware of CBG 62. Pt asymptomatic Tube feedings infusing without difficulty. To recheck CBG again in 1 hour

## 2012-08-02 NOTE — Plan of Care (Signed)
Problem: Phase III Progression Outcomes Goal: Activity advanced as tolerated Outcome: Progressing Pt tolerates OOB for 4 hours. Haemodynamically stable. Some restlessness noted in PM. Pain med given with good effect. Pt hypoglycemic at 17oo. Apple juice given. Awaiting placement bed on 4n.

## 2012-08-02 NOTE — Progress Notes (Signed)
Patient ID: Alex Foster, male   DOB: March 24, 1946, 66 y.o.   MRN: [id number]   LOS: 63 days   Subjective: Says he's doing good.  Objective: Vital signs in last 24 hours: Temp:  [97.4 F (36.3 C)-98.9 F (37.2 C)] 97.9 F (36.6 C) (11/01 0437) Pulse Rate:  [60-84] 69  (11/01 0450) Resp:  [16-30] 18  (11/01 0450) BP: (127-163)/(66-119) 147/80 mmHg (11/01 0450) SpO2:  [95 %-100 %] 98 % (11/01 0450) FiO2 (%):  [28 %] 28 % (11/01 0450) Weight:  [208 lb 1.8 oz (94.4 kg)] 208 lb 1.8 oz (94.4 kg) (11/01 0500) Last BM Date: 08/01/12  CBG (last 3)   Basename 08/02/12 0436 08/02/12 0005 08/01/12 1945  GLUCAP 86 81 111*   Lab Results  Component Value Date   INR 2.29* 08/02/2012   INR 2.18* 07/31/2012   INR 1.99* 07/29/2012       General appearance: alert and no distress Resp: clear to auscultation bilaterally Cardio: regular rate and rhythm GI: normal findings: bowel sounds normal and soft, non-tender   Assessment/Plan: MCC  Respiratory failure -- tolerating HTC 28%  TBI w/ICC -- per NS. Therapies working with pt. Klonopin and oxycodone for agitation. CVA -- per NS  Multiple facial fxs -- Non-operative per ENT  Multiple bilateral rib fxs w/HTPX s/p bilateral CT  ID - MRSA tracheobronchitis Vanco 12/14  ABL anemia -- stable  UE DVT -- Coumadin - pharmacy adjusting  Urinary retention -- foley. Will start flomax/urecholine for another voiding trial with increased awareness. Agitaiton -- requiring sitter  DM -- Stable for now FEN -- TF via PEG, binder, ST following  Dispo -- working on placement at SNF vs outside [facility]. Will transfer to tele    Freeman Caldron, PA-C Pager: [phone] General Trauma PA Pager: [phone]   08/02/2012

## 2012-08-03 ENCOUNTER — Encounter (HOSPITAL_COMMUNITY): Payer: Self-pay | Admitting: Surgery

## 2012-08-03 DIAGNOSIS — I82A11 Acute embolism and thrombosis of right axillary vein: Secondary | ICD-10-CM

## 2012-08-03 DIAGNOSIS — J95821 Acute postprocedural respiratory failure: Secondary | ICD-10-CM

## 2012-08-03 DIAGNOSIS — Z7901 Long term (current) use of anticoagulants: Secondary | ICD-10-CM

## 2012-08-03 DIAGNOSIS — E119 Type 2 diabetes mellitus without complications: Secondary | ICD-10-CM | POA: Diagnosis present

## 2012-08-03 DIAGNOSIS — I10 Essential (primary) hypertension: Secondary | ICD-10-CM | POA: Diagnosis present

## 2012-08-03 DIAGNOSIS — I219 Acute myocardial infarction, unspecified: Secondary | ICD-10-CM | POA: Insufficient documentation

## 2012-08-03 HISTORY — DX: Acute embolism and thrombosis of right axillary vein: I82.A11

## 2012-08-03 LAB — GLUCOSE, CAPILLARY
Glucose-Capillary: 69 mg/dL — ABNORMAL LOW (ref 70–99)
Glucose-Capillary: 91 mg/dL (ref 70–99)

## 2012-08-03 MED ORDER — DEXTROSE 50 % IV SOLN
INTRAVENOUS | Status: AC
  Administered 2012-08-03: 25 mL via INTRAVENOUS
  Filled 2012-08-03: qty 50

## 2012-08-03 MED ORDER — LORAZEPAM 2 MG/ML IJ SOLN
2.0000 mg | Freq: Once | INTRAMUSCULAR | Status: AC
  Administered 2012-08-03: 2 mg via INTRAVENOUS
  Filled 2012-08-03: qty 1

## 2012-08-03 MED ORDER — DEXTROSE 50 % IV SOLN
25.0000 mL | Freq: Once | INTRAVENOUS | Status: AC | PRN
  Administered 2012-08-03: 25 mL via INTRAVENOUS

## 2012-08-03 NOTE — Progress Notes (Signed)
Patient ID: Alex Foster, male   DOB: June 03, 1946, 66 y.o.   MRN: [id number]   LOS: 64 days   Subjective: A little agitated due to getting bath, says hes ok, moving right arm alot  Objective: Vital signs in last 24 hours: Temp:  [97.5 F (36.4 C)-98.4 F (36.9 C)] 98.1 F (36.7 C) (11/02 0326) Pulse Rate:  [70-79] 76  (11/02 0800) Resp:  [20-34] 22  (11/02 0230) BP: (132-162)/(66-96) 142/78 mmHg (11/02 0230) SpO2:  [93 %-100 %] 100 % (11/02 0800) FiO2 (%):  [28 %] 28 % (11/02 0800) Weight:  [212 lb 8.4 oz (96.4 kg)-213 lb 10 oz (96.9 kg)] 213 lb 10 oz (96.9 kg) (11/02 0500) Last BM Date: 08/02/12  CBG (last 3)   Basename 08/03/12 0842 08/03/12 0507 08/03/12 0205  GLUCAP 151* 91 96   Lab Results  Component Value Date   INR 2.29* 08/02/2012   INR 2.18* 07/31/2012   INR 1.99* 07/29/2012       General appearance: alert and no distress Resp: clear to auscultation bilaterally Cardio: regular rate and rhythm GI: normal findings: bowel sounds normal and soft, non-tender   Assessment/Plan: MCC  Respiratory failure -- tolerating HTC 28%  TBI w/ICC -- per NS. Therapies working with pt. Klonopin and oxycodone for agitation. CVA -- per NS  Multiple facial fxs -- Non-operative per ENT  Multiple bilateral rib fxs w/HTPX s/p bilateral CT  ID - MRSA tracheobronchitis Vanco  ABL anemia -- stable  UE DVT -- Coumadin - pharmacy adjusting  Urinary retention -- foley. Will start flomax/urecholine for another voiding trial with increased awareness. Agitaiton -- requiring sitter  DM -- Stable for now FEN -- TF via PEG, binder, changed yesterday  Dispo -- working on placement at SNF vs outside [facility]. Will transfer to tele    Doristine Mango General Trauma PA Pager: [phone]   08/03/2012  Patient Active Problem List  Diagnosis  . Multiple rib fractures  . TBI (traumatic brain injury)  . Extensive facial fractures  . Leukocytosis  . Lactic acidosis  . Acute respiratory  failure with hypoxia  . Cardiac arrest  . Cardiogenic shock  . Tracheobronchitis, MRSA  . Mobitz (type) II atrioventricular block  . Pleural effusion  . Hypertension  . Myocardial infarction  . DM (diabetes mellitus)  . DVT of axillary vein, acute right  . Anticoagulation goal of INR 2 to 3    Lot of chronic issues, gradually improving  Ardeth Sportsman, M.D., F.A.C.S. Gastrointestinal and Minimally Invasive Surgery [facility] [address], [address] [place], [place] [address] [phone] Main / Paging [phone] Voice Mail

## 2012-08-03 NOTE — Progress Notes (Signed)
Hypoglycemic Event  CBG: 69  Treatment: D50 IV 25 mL  Symptoms: None  Follow-up CBG: Time:0205 CBG Result:96   Possible Reasons for Event: Inadequate meal intake  Comments/MD notified:    Sharen Counter  Remember to initiate Hypoglycemia Order Set & complete

## 2012-08-03 NOTE — Progress Notes (Signed)
Hypoglycemic Event  CBG: 61  Treatment: D50 IV 25 mL  Symptoms: None  Follow-up CBG: Time:0137 CBG Result:69   Possible Reasons for Event: Other: patient tube feeding was on hold  Comments/MD notified:    Sharen Counter  Remember to initiate Hypoglycemia Order Set & complete
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
## 2012-08-04 LAB — GLUCOSE, CAPILLARY

## 2012-08-04 MED ORDER — LORAZEPAM 2 MG/ML IJ SOLN
2.0000 mg | Freq: Once | INTRAMUSCULAR | Status: AC
  Administered 2012-08-04: 2 mg via INTRAVENOUS
  Filled 2012-08-04: qty 1

## 2012-08-04 NOTE — Progress Notes (Signed)
Hope for placement this week Patient examined and I agree with the assessment and plan  Violeta Gelinas, MD, MPH, FACS Pager: [phone]  08/04/2012 1:04 PM
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
## 2012-08-05 LAB — GLUCOSE, CAPILLARY
Glucose-Capillary: 135 mg/dL — ABNORMAL HIGH (ref 70–99)
Glucose-Capillary: 74 mg/dL (ref 70–99)
Glucose-Capillary: 77 mg/dL (ref 70–99)
Glucose-Capillary: 81 mg/dL (ref 70–99)
Glucose-Capillary: 94 mg/dL (ref 70–99)
Glucose-Capillary: 95 mg/dL (ref 70–99)
Glucose-Capillary: 135 mg/dL — ABNORMAL HIGH (ref 70–99)

## 2012-08-05 LAB — PROTIME-INR: Prothrombin Time: 30.3 seconds — ABNORMAL HIGH (ref 11.6–15.2)

## 2012-08-05 MED ORDER — WARFARIN SODIUM 5 MG PO TABS
5.0000 mg | ORAL_TABLET | ORAL | Status: DC
  Administered 2012-08-06: 5 mg via ORAL
  Filled 2012-08-05: qty 1

## 2012-08-05 MED ORDER — WARFARIN SODIUM 5 MG PO TABS
5.0000 mg | ORAL_TABLET | Freq: Once | ORAL | Status: DC
  Filled 2012-08-05: qty 1

## 2012-08-05 MED ORDER — DEXTROSE 50 % IV SOLN
1.0000 | Freq: Once | INTRAVENOUS | Status: AC | PRN
  Administered 2012-08-05: 50 mL via INTRAVENOUS

## 2012-08-05 MED ORDER — SODIUM CHLORIDE 0.9 % IV SOLN
INTRAVENOUS | Status: DC
  Administered 2012-08-05 – 2012-08-06 (×2): via INTRAVENOUS

## 2012-08-05 MED ORDER — LORAZEPAM 2 MG/ML IJ SOLN
INTRAMUSCULAR | Status: AC
  Administered 2012-08-05: 1 mg
  Filled 2012-08-05: qty 1

## 2012-08-05 MED ORDER — LORAZEPAM 2 MG/ML IJ SOLN
1.0000 mg | Freq: Four times a day (QID) | INTRAMUSCULAR | Status: DC | PRN
  Administered 2012-08-06: 1 mg via INTRAVENOUS
  Filled 2012-08-05 (×2): qty 1

## 2012-08-05 MED ORDER — DEXTROSE 50 % IV SOLN
INTRAVENOUS | Status: AC
  Filled 2012-08-05: qty 50

## 2012-08-05 MED ORDER — OSMOLITE 1.5 CAL PO LIQD
1000.0000 mL | ORAL | Status: DC
  Filled 2012-08-05 (×6): qty 1000

## 2012-08-05 MED ORDER — WARFARIN SODIUM 7.5 MG PO TABS
7.5000 mg | ORAL_TABLET | ORAL | Status: DC
  Filled 2012-08-05: qty 1
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
## 2012-08-05 NOTE — Progress Notes (Signed)
Nutrition Follow-up  Intervention:   Transition TF to Osmolite 1.5 @ 60 ml/hr with 30 ml Prostat TID, will provide: 2460 kcal, 135 grams protein, 1097 ml H2O. Total free water will be 2417 ml.  Assessment:   Pt confused, sitter at bedside.  Note ongoing plans for d/c to SNF on medically appropriate.   Per MD note pt's G-tube is a 14 Fr and is not flushing well, plans to convert to a 18 or 20 Fr.  Pt with hypoglycemic episodes over the weekend due to TF being on hold.  Pt continues with Pivot 1.5 @ 60 mL/hr and Prostat TID to provide 2460 kcal, 180g protein, and 1092 mL free water which provides 100% estimated kcal and protein needs.  Residuals: 20 mL Free water flushes: 220 mL q 4 hrs for additional 1320 mL/day.  Diet Order: NPO  Meds: Scheduled Meds:    . antiseptic oral rinse  15 mL Mouth Rinse QID  . bethanechol  25 mg Oral QID  . chlorhexidine  15 mL Mouth Rinse BID  . clonazePAM  1 mg Oral BID  . feeding supplement  30 mL Per Tube TID WC  . free water  220 mL Per Tube Q4H  . guaiFENesin  20 mL Per Tube Q4H  . insulin aspart  0-15 Units Subcutaneous Q4H  . insulin glargine  35 Units Subcutaneous Daily  . [COMPLETED] LORazepam  2 mg Intravenous Once  . metFORMIN  500 mg Oral BID WC  . multivitamin  5 mL Per Tube Daily  . oxyCODONE  10 mg Oral Q6H  . pantoprazole sodium  40 mg Per Tube Q1200  . potassium chloride  20 mEq Per Tube BID  . sodium chloride  10-40 mL Intracatheter Q12H  . Tamsulosin HCl  0.8 mg Oral Daily  . warfarin  5 mg Oral ONCE-1800  . warfarin  5 mg Oral Custom  . warfarin  7.5 mg Oral Custom  . Warfarin - Pharmacist Dosing Inpatient   Does not apply q1800  . [DISCONTINUED] warfarin  5 mg Oral Custom  . [DISCONTINUED] warfarin  7.5 mg Oral Custom   Continuous Infusions:    . feeding supplement (PIVOT 1.5 CAL) 1,000 mL (08/04/12 1211)   PRN Meds:.acetaminophen (TYLENOL) oral liquid 160 mg/5 mL, albuterol, bisacodyl, fentaNYL, ipratropium, ondansetron  (ZOFRAN) IV, sodium chloride   CMP     Component Value Date/Time   NA 139 07/31/2012 1625   K 4.2 07/31/2012 1625   CL 104 07/31/2012 1625   CO2 26 07/31/2012 1625   GLUCOSE 146* 07/31/2012 1625   BUN 32* 07/31/2012 1625   CREATININE 1.01 07/31/2012 1625   CALCIUM 8.7 07/31/2012 1625   PROT 7.2 07/28/2012 1120   ALBUMIN 2.8* 07/28/2012 1120   AST 26 07/28/2012 1120   ALT 14 07/28/2012 1120   ALKPHOS 169* 07/28/2012 1120   BILITOT 0.6 07/28/2012 1120   GFRNONAA 77* 07/31/2012 1625   GFRAA 89* 07/31/2012 1625   CBG (last 3)   Basename 08/04/12 0746 08/03/12 1637 08/03/12 1157  GLUCAP 123* 63* 163*    Intake/Output Summary (Last 24 hours) at 08/05/12 1441 Last data filed at 08/05/12 0300  Gross per 24 hour  Intake    440 ml  Output   1250 ml  Net   -810 ml    Weight Status:  208-213 lbs, slightly variable but overall stable  Re-estimated needs:  2340-2630 kcal, 120-140 grams protein  Nutrition Dx:  Inadequate oral intake r/t inability to eat  AEB NPO status; ongoing  Goal: Pt to meet >/= 90% of their estimated nutrition needs; met.  Monitor:  TF tolerance, weight trends, labs    Kendell Bane RD, LDN, CNSC [phone] Pager [phone] After Hours Pager

## 2012-08-05 NOTE — Clinical Social Work Note (Signed)
Clinical Social Worker continuing to follow patient for emotional support and discharge planning needs.  CSW followed up on previous referral to facilities in both [place] and [place] for SNF placement now that patient MRSA PNA treatment is complete.  CSW awaiting to hear from facilities regarding possible admission.  MD/PA notified of the need to dc sitter 24 hours prior to patient discharge to facility.  CSW to follow up with patient family and facilities to facilitate patient discharge needs.  Macario Golds, [place] [phone]

## 2012-08-05 NOTE — Progress Notes (Signed)
Hypoglycemic Event  CBG: 60  Treatment: D50 IV 50 mL  Symptoms: None  Follow-up CBG: Time:2020 CBG Result:135  Possible Reasons for Event: Inadequate meal intake  Comments/MD notified:Dr. Glennis Brink, Lyndle Herrlich  Remember to initiate Hypoglycemia Order Set & complete

## 2012-08-05 NOTE — Progress Notes (Signed)
ANTICOAGULATION CONSULT NOTE - Follow Up Consult  Pharmacy Consult for Coumadin Indication: DVT (RUE)  No Known Allergies  Patient Measurements: Height: 6\' 3"  (190.5 cm) Weight: 213 lb 10 oz (96.9 kg) IBW/kg (Calculated) : 84.5  Heparin Dosing Weight:   Vital Signs: Temp: 98.4 F (36.9 C) (11/04 1058) Temp src: Oral (11/04 1058) BP: 143/94 mmHg (11/04 1058) Pulse Rate: 82  (11/04 1058)  Labs:  Basename 08/05/12 0600  HGB --  HCT --  PLT --  APTT --  LABPROT 30.3*  INR 3.10*  HEPARINUNFRC --  CREATININE --  CKTOTAL --  CKMB --  TROPONINI --    Estimated Creatinine Clearance: 88.3 ml/min (by C-G formula based on Cr of 1.01).  Assessment: 66yom continuing on Coumadin for RUE DVT. INR (3.1) is just above goal range (only checking MWF) on Coumadin 7.5mg  daily except 5mg  on Mondays. Will plan to give Coumadin 5mg  today and tomorrow to allow INR to trend down into therapeutic range and follow-up INR on Wednesday (may need to adjust regimen to 7.5mg  daily except 5mg  MWF). - No CBC this AM - No significant bleeding reported  Goal of Therapy:  INR 2-3   Plan:  1. Coumadin 5mg  today and then Tue/Thur/Sat, Coumadin 7.5mg  Mon/Wed/Fri/Sun 2. Continue checking INR MWF  [name] [phone] 08/05/2012,11:15 AM
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
## 2012-08-05 NOTE — Progress Notes (Signed)
Patient's G-tube is a 14 Fr. Foley and is not flushing well.  Will change out to 18Fr. or 20 Fr.  This patient has been seen and I agree with the findings and treatment plan.  Marta Lamas. Gae Bon, MD, FACS [phone] (pager) [phone] (direct pager) Trauma Surgeon

## 2012-08-05 NOTE — Progress Notes (Signed)
Patient ID: Alex Foster, male   DOB: [birth year], 66 y.o.   MRN: [id number]   LOS: 66 days   Subjective: No c/o.  Objective: Vital signs in last 24 hours: Temp:  [97.6 F (36.4 C)-98.4 F (36.9 C)] 98.4 F (36.9 C) (11/04 1058) Pulse Rate:  [55-83] 82  (11/04 1058) Resp:  [18-22] 22  (11/04 1058) BP: (118-143)/(48-94) 143/94 mmHg (11/04 1058) SpO2:  [94 %-99 %] 96 % (11/04 1058) FiO2 (%):  [28 %-35 %] 28 % (11/04 1058) Weight:  [213 lb 10 oz (96.9 kg)] 213 lb 10 oz (96.9 kg) (11/04 0514) Last BM Date: 08/02/12   CBG (last 3)   Basename 08/04/12 0746 08/03/12 1637 08/03/12 1157  GLUCAP 123* 63* 163*    Lab Results  Component Value Date   INR 3.10* 08/05/2012   INR 2.29* 08/02/2012   INR 2.18* 07/31/2012    General appearance: alert and no distress Resp: clear to auscultation bilaterally Cardio: regular rate and rhythm GI: normal findings: bowel sounds normal and soft, non-tender   Assessment/Plan: [facility]  Respiratory failure -- tolerating HTC 28%  TBI w/ICC -- per NS. Therapies working with pt. Klonopin and oxycodone for agitation.  CVA -- per NS  Multiple facial fxs -- Non-operative per ENT  Multiple bilateral rib fxs w/HTPX s/p bilateral CT  ID - Treatment for MRSA PNA is complete. Pt no longer has an active MRSA infection. ABL anemia -- stable  UE DVT -- Coumadin - pharmacy adjusting  Urinary retention -- Voiding trial again today Agitaiton -- requiring sitter  DM -- Stable for now  FEN -- TF via PEG Dispo -- working on placement at SNF vs outside CIR.       Freeman Caldron, PA-C Pager: [phone] General Trauma PA Pager: [phone]   08/05/2012

## 2012-08-06 LAB — GLUCOSE, CAPILLARY
Glucose-Capillary: 102 mg/dL — ABNORMAL HIGH (ref 70–99)
Glucose-Capillary: 65 mg/dL — ABNORMAL LOW (ref 70–99)
Glucose-Capillary: 80 mg/dL (ref 70–99)
Glucose-Capillary: 91 mg/dL (ref 70–99)

## 2012-08-06 MED ORDER — DEXTROSE 50 % IV SOLN
INTRAVENOUS | Status: AC
  Administered 2012-08-06: 05:00:00
  Filled 2012-08-06: qty 50

## 2012-08-06 MED ORDER — DEXTROSE-NACL 5-0.9 % IV SOLN
INTRAVENOUS | Status: DC
  Administered 2012-08-06: 05:00:00 via INTRAVENOUS

## 2012-08-06 MED ORDER — ZOLPIDEM TARTRATE 10 MG PO TABS
10.0000 mg | ORAL_TABLET | Freq: Every day | ORAL | Status: DC
  Administered 2012-08-06 – 2012-08-07 (×2): 10 mg
  Filled 2012-08-06 (×2): qty 1

## 2012-08-06 MED ORDER — DEXTROSE 50 % IV SOLN
INTRAVENOUS | Status: AC
  Administered 2012-08-06: 01:00:00
  Filled 2012-08-06: qty 50

## 2012-08-06 MED ORDER — AMANTADINE HCL 50 MG/5ML PO SYRP
100.0000 mg | ORAL_SOLUTION | Freq: Three times a day (TID) | ORAL | Status: DC
  Administered 2012-08-06 – 2012-08-08 (×6): 100 mg
  Filled 2012-08-06 (×8): qty 10

## 2012-08-06 MED ORDER — PROPRANOLOL HCL 20 MG/5ML PO SOLN
30.0000 mg | Freq: Two times a day (BID) | ORAL | Status: DC
  Administered 2012-08-06 – 2012-08-08 (×5): 30 mg
  Filled 2012-08-06 (×8): qty 7.5

## 2012-08-06 MED ORDER — LORAZEPAM 2 MG/ML IJ SOLN
1.0000 mg | INTRAMUSCULAR | Status: DC | PRN
  Administered 2012-08-06 – 2012-08-08 (×7): 2 mg via INTRAVENOUS
  Filled 2012-08-06 (×7): qty 1

## 2012-08-06 MED ORDER — DEXTROSE 50 % IV SOLN
25.0000 mL | Freq: Once | INTRAVENOUS | Status: AC | PRN
  Administered 2012-08-06: 25 mL via INTRAVENOUS

## 2012-08-06 NOTE — Progress Notes (Signed)
Hypoglycemic Event  CBG: 65  Treatment:D50 IV 25ml  Symptoms:None observed      Follow-up CBG: Time:12:45am CBG Result:100  Possible Reasons for Event: NPO  Comments/MD notified: per protocol    Alex Foster  Remember to initiate Hypoglycemia Order Set & complete

## 2012-08-06 NOTE — Progress Notes (Signed)
Hypoglycemic Event  CBG: 56  Treatment: D50 IV 25ml via R upper arm IV  Symptoms:none observed  Follow-up CBG: Time: 4:45am CBG Result:102  Possible Reasons for Event: NPO  Comments/MD notified:MD notified of low blood sugars and new orders of D5NS at 28ml/hr continuously     Alex Foster  Remember to initiate Hypoglycemia Order Set & complete

## 2012-08-06 NOTE — Progress Notes (Signed)
Inpatient Diabetes Program Recommendations  AACE/ADA: New Consensus Statement on Inpatient Glycemic Control (2013)  Target Ranges:  Prepandial:   less than 140 mg/dL      Peak postprandial:   less than 180 mg/dL (1-2 hours)      Critically ill patients:  140 - 180 mg/dL   Reason for Visit:Results for ERIC, NEES (MRN [id number]) as of 08/06/2012 12:32  Ref. Range 08/05/2012 09:02 08/05/2012 13:06 08/05/2012 15:41 08/05/2012 19:44 08/05/2012 20:20  Glucose-Capillary Latest Range: 70-99 mg/dL 086 (H) 578 (H) 469 (H) 60 (L) 135 (H)   Note low CBG's yesterday while tube feeds were held.  Please consider decreasing Lantus to 20 units daily and add Novolog tube feed coverage 3 units q 4 hours (to cover tube feeds-Hold if tube feeds held). By reducing Basal insulin, this will help prevent low CBG's if tube feeds are held. Will follow.

## 2012-08-06 NOTE — Progress Notes (Signed)
Speech Language Pathology Treatment Patient Details Name: Alex Foster MRN: [id number] DOB: [birth year] Today's Date: 08/06/2012 Time: 9528-4132 SLP Time Calculation (min): 25 min  Assessment / Plan / Recommendation Clinical Impression  Treatment focused on cognitive recovery. Patient more lethargic today than noted during previous treatment sessions.  Received pain meds prior to therapy which may overall impact function.  Despite lethargy however, patient able to sustain attention to function familiar ADL for 30 seconds-1 minute with min verbal cueing. Able to follow basic 1-step directions with min visual cues. Max cues for orientation to place and situation. Some leak speech noted today. Unable to place PMSV due to unable to locate in room. Will plan to call floor in which patient transferred from to locate PMSV. If unable to locate, will provide new valve.     SLP Plan  Continue with current plan of care    Pertinent Vitals/Pain n/a  SLP Goals  SLP Goals Potential to Achieve Goals: Good Potential Considerations: Severity of impairments Progress/Goals/Alternative treatment plan discussed with pt/caregiver and they: Patient unable to parrticipate in goal setting SLP Goal #4: Pt. will improve biographical/temporal/spatial orientation using environmental cues with 90% accuracy.  SLP Goal #4 - Progress: Progressing toward goal SLP Goal #5: Pt. will demonstrate functional problem solving with maximal verbal/visual/tactile cueing.  SLP Goal #5 - Progress: Progressing toward goal  General Temperature Spikes Noted: No Respiratory Status: Trach (trach collar) Behavior/Cognition: Alert;Cooperative;Pleasant mood;Lethargic;Requires cueing;Decreased sustained attention Oral Cavity - Dentition: Edentulous Patient Positioning: Upright in bed   Treatment Treatment focused on: Cognition Skilled Treatment: cueing hierarchy, open-ended questions, mod-max verbal/visual/tactile cueing   GO   Ferdinand Lango MA, CCC-SLP [phone]   Ferdinand Lango Meryl 08/06/2012, 2:51 PM

## 2012-08-06 NOTE — Clinical Social Work Note (Signed)
Clinical Social Worker continuing to follow patient for emotional support and discharge planning needs.  CSW had faxed additional information to [facility] in [place], [place], however they have officially denied him on the basis of his agitation and requiring a sitter.  CM spoke with medical director to further address.  CSW to continue to follow to facilitate patient discharge needs.  Alex Foster, [place] [phone]

## 2012-08-06 NOTE — Progress Notes (Signed)
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 Pt continues to progress with therapy during functional mobility as well as with tolerance to mobility. Pt with increase lethargy this session compared to recent session. However, able to [place] left UE/LE noticeably more with cueing to attend to left side. [place] level (Confused, Appropriate) with treatment demonstrating goal directed behavior with commands (ie-donning socks and using comb). Limited by fatigue and lethargy. Co-treatment with SLP. Pt most appropriate for d/c plan of CIR. However, CIR currently denying admit. Will need SNF if CIR not an option.    Follow Up Recommendations  SNF (Recommend CIR, but CIR currently denying admit.)     Does the patient have the potential to tolerate intense rehabilitation  No, Recommend SNF  Barriers to Discharge        Equipment Recommendations  None recommended by PT    Recommendations for Other Services    Frequency Min 3X/week   Plan Frequency remains appropriate;Discharge plan remains appropriate    Precautions / Restrictions Precautions Precautions: Fall Precaution Comments: L shoulder subluxation and L side hemiparesis. Restrictions Weight Bearing Restrictions: No   Pertinent Vitals/Pain None reported with questioning or noted.    Mobility  Bed Mobility Bed Mobility: Supine to Sit Supine to Sit: 1: +2 Total assist;HOB flat Supine to Sit: Patient Percentage: 70% Details for Bed Mobility Assistance: Max cues for sequence and for problem solving with functional mobility. Assist also to trunk to translate anterior. Transfers Transfers: Sit to Stand;Stand to Sit;Stand Pivot Transfers Sit to Stand: 1: +2 Total assist;From bed Sit to Stand: Patient  Percentage: 70% Stand to Sit: 1: +2 Total assist;To chair/3-in-1 Stand to Sit: Patient Percentage: 70% Stand Pivot Transfers: 1: +2 Total assist Stand Pivot Transfers: Patient Percentage: 50% Details for Transfer Assistance: Assist at ischials to translate anterior with cues for safety and sequence. Facilitation for rotation of pelvis to chair. No buckling in bilateral knees with motion. Max cues for problem solving with mobility. Ambulation/Gait Ambulation/Gait Assistance: Not tested (comment) Stairs: No Wheelchair Mobility Wheelchair Mobility: No    Exercises     PT Diagnosis:    PT Problem List:   PT Treatment Interventions:     PT Goals Acute Rehab PT Goals PT Goal Formulation: Patient unable to participate in goal setting Time For Goal Achievement: 08/20/12 Potential to Achieve Goals: Good Pt will go Supine/Side to Sit: with supervision PT Goal: Supine/Side to Sit - Progress: Revised due to lack of progress Pt will Sit at [facility] of Bed: with supervision;6-10 min;with unilateral upper extremity support PT Goal: Sit at Edge Of Bed - Progress: Revised due to lack of progress Pt will go Sit to Stand: with upper extremity assist;with mod assist PT Goal: Sit to Stand - Progress: Revised due to lack of progress Pt will Transfer Bed to Chair/Chair to Bed: with mod assist PT Transfer Goal: Bed to Chair/Chair to Bed - Progress: Revised due to lack of progress Additional Goals Additional Goal #1: Pt. will participate in pregait activity at EOB with total a +2 (pt=40%) PT Goal: Additional Goal #1 - Progress: Revised due to lack of progress Additional Goal #2: Pt will demonstrate selective attention in minimally distracting environment throughout session. PT Goal: Additional Goal #2 - Progress: Revised dut to lack of progress  Visit Information  Last PT Received On: 08/06/12 Assistance Needed: +2 PT/OT Co-Evaluation/Treatment:  (Co-treatment with SLP)    Subjective Data  Subjective:  Pt with increased lethargy this pm compared to recent session. Restless in bed initially. Some verbalizations to questioning during session. Patient Stated Goal: not able to participate   Cognition  Overall Cognitive Status: Impaired Area of Impairment: Attention;Following commands;Safety/judgement;Awareness of errors;Awareness of deficits;Problem solving;[place] level Arousal/Alertness: Lethargic Orientation Level: Disoriented to;Place;Time;Situation Behavior During Session: Lethargic Current Attention Level: Sustained Attention - Other Comments: Sustained throughout for up to 30 second intervals. Increasesd distractability this am with decreased sustained attention throughout. Memory Deficits: Only oriented to self today with increased lethargy. Following Commands: Follows one step commands inconsistently;Follows one step commands with increased time Safety/Judgement: Decreased awareness of safety precautions;Decreased safety judgement for tasks assessed;Impulsive;Decreased awareness of need for assistance Safety/Judgement - Other Comments: Initially restless, but able to be redirected. Then with increased lethargy at EOB. Awareness of Errors: Assistance required to identify errors made;Assistance required to correct errors made Problem Solving: Max cueing for problem solving with all functional mobility. Cognition - Other Comments: Followed one-step commands inconsistently today due to decreased attention and lethargy. Increased time when following.    Balance  Balance Balance Assessed: Yes Static Sitting Balance Static Sitting - Balance Support: Right upper extremity supported;Feet supported Static Sitting - Level of Assistance: 4: Min assist Static Sitting - Comment/# of Minutes: Pt able to sit EOB for 10 minutes with up to min assist due to left/anterior lean. Cues to correct posture and attain/maintain midline of trunk. Limited by decreased attention/lethargy this session. Dynamic  Sitting Balance Dynamic Sitting - Balance Support: Right upper extremity supported;Feet supported;During functional activity Dynamic Sitting - Level of Assistance: 4: Min assist Dynamic Sitting - Comments: Assist for balance during functional activity of donning socks at EOB. Cues for problem solving and assist to maintain midline of trunk.  End of Session PT - End of Session Equipment Utilized During Treatment: Gait belt;Oxygen (Trach collar.) Activity Tolerance: Patient tolerated treatment well;Patient limited by fatigue Patient left: in chair;with call bell/phone within reach;with restraints reapplied;Other (comment) Psychiatrist present.) Nurse Communication: Mobility status;Need for lift equipment   GP     Cephus Shelling 08/06/2012, 2:36 PM  08/06/2012 Cephus Shelling, PT, DPT [phone]

## 2012-08-06 NOTE — Progress Notes (Signed)
Orthopedic Tech Progress Note Patient Details:  Alex Foster 1946/07/24 [id number]  Patient ID: [name], male   DOB: 28-Apr-1946, 66 y.o.   MRN: [id number] Abdominal binder left with rn;rn will apply;viewed order from rn order list  [name] 08/06/2012, 2:49 PM

## 2012-08-06 NOTE — Progress Notes (Signed)
Pt.is getting agitated  and anxious pulling on tubings and his trachea. Med was given as ordered the patient.still agitated, trying to get out of bed and not listening to anyone. Sitter in room and in place. Md on call was called and notified of situation and orders received and carried out. Rn will continue to monitor patient.

## 2012-08-06 NOTE — Progress Notes (Addendum)
Pt.'s peg tube is leaking. Meds due to be given and Pt. Is NPO. MD notified of Pt.'s situation and orders was received to hold all po meds.peg tube taped off per protocol. Will give report to incoming nurse and continue to monitor patient.

## 2012-08-06 NOTE — Progress Notes (Signed)
Patient ID: Alex Foster, male   DOB: 06-28-1946, 65 y.o.   MRN: [id number]    Subjective: Agitation again overnight, calm now  Objective: Vital signs in last 24 hours: Temp:  [97.8 F (36.6 C)-98.4 F (36.9 C)] 97.8 F (36.6 C) (11/05 0119) Pulse Rate:  [60-92] 77  (11/05 0827) Resp:  [20-22] 20  (11/05 0827) BP: (142-158)/(75-94) 157/75 mmHg (11/05 0119) SpO2:  [89 %-99 %] 95 % (11/05 0827) FiO2 (%):  [28 %-35 %] 28 % (11/05 0827) Last BM Date: 08/02/12  Intake/Output from previous day:   Intake/Output this shift:    General appearance: cooperative Neck: trach Resp: clear to auscultation bilaterally Cardio: regular rate and rhythm GI: soft, G tube capped, NT, UH reduces Neuro: F/C  Lab Results: CBC  No results found for this basename: WBC:2,HGB:2,HCT:2,PLT:2 in the last 72 hours BMET No results found for this basename: NA:2,K:2,CL:2,CO2:2,GLUCOSE:2,BUN:2,CREATININE:2,CALCIUM:2 in the last 72 hours PT/INR  Basename 08/05/12 0600  LABPROT 30.3*  INR 3.10*   ABG No results found for this basename: PHART:2,PCO2:2,PO2:2,HCO3:2 in the last 72 hours  Studies/Results: No results found.  Anti-infectives: Anti-infectives     Start     Dose/Rate Route Frequency Ordered Stop   07/22/12 1000   vancomycin (VANCOCIN) 1,500 mg in sodium chloride 0.9 % 500 mL IVPB        1,500 mg 250 mL/hr over 120 Minutes Intravenous Every 24 hours 07/22/12 0945 08/03/12 1148   06/24/12 1800   levofloxacin (LEVAQUIN) IVPB 750 mg        750 mg 100 mL/hr over 90 Minutes Intravenous Every 24 hours 06/24/12 1539 07/07/12 2015   06/23/12 1000   fluconazole (DIFLUCAN) IVPB 100 mg  Status:  Discontinued        100 mg 50 mL/hr over 60 Minutes Intravenous Every 24 hours 06/22/12 1632 07/01/12 0809   06/23/12 0000   vancomycin (VANCOCIN) 1,500 mg in sodium chloride 0.9 % 500 mL IVPB  Status:  Discontinued        1,500 mg 250 mL/hr over 120 Minutes Intravenous Every 24 hours 06/22/12 1145  06/24/12 1539   06/22/12 1730   fluconazole (DIFLUCAN) IVPB 200 mg        200 mg 100 mL/hr over 60 Minutes Intravenous  Once 06/22/12 1630 06/22/12 1852   06/19/12 2200   vancomycin (VANCOCIN) IVPB 1000 mg/200 mL premix  Status:  Discontinued        1,000 mg 200 mL/hr over 60 Minutes Intravenous Every 12 hours 06/19/12 0940 06/22/12 1123   06/19/12 1000   vancomycin (VANCOCIN) 2,000 mg in sodium chloride 0.9 % 500 mL IVPB        2,000 mg 250 mL/hr over 120 Minutes Intravenous  Once 06/19/12 0940 06/19/12 1300   06/19/12 0930   ceFEPIme (MAXIPIME) 2 g in dextrose 5 % 50 mL IVPB  Status:  Discontinued        2 g 100 mL/hr over 30 Minutes Intravenous 3 times per day 06/19/12 0904 06/24/12 1539   06/04/12 1000   vancomycin (VANCOCIN) IVPB 1000 mg/200 mL premix  Status:  Discontinued        1,000 mg 200 mL/hr over 60 Minutes Intravenous Every 12 hours 06/04/12 0837 06/05/12 1045   06/03/12 1800   vancomycin (VANCOCIN) 1,500 mg in sodium chloride 0.9 % 500 mL IVPB  Status:  Discontinued        1,500 mg 250 mL/hr over 120 Minutes Intravenous Every 24 hours 06/02/12 1428 06/04/12 0837  06/02/12 1800   vancomycin (VANCOCIN) 2,000 mg in sodium chloride 0.9 % 500 mL IVPB        2,000 mg 250 mL/hr over 120 Minutes Intravenous  Once 06/02/12 1428 06/02/12 2024   06/02/12 1400   cefTAZidime (FORTAZ) 1 g in dextrose 5 % 50 mL IVPB        1 g 100 mL/hr over 30 Minutes Intravenous 3 times per day 06/02/12 1349 06/12/12 0642          Assessment/Plan: s/p Procedure(s): PERCUTANEOUS TRACHEOSTOMY PERCUTANEOUS ENDOSCOPIC GASTROSTOMY (PEG) PLACEMENT [facility]  Respiratory failure -- tolerating HTC 28%  TBI w/ICC -- per NS. Therapies working with pt. Agitation at night, add Ativan PRN and Ambien at HS CVA -- per NS  Multiple facial fxs -- Non-operative per ENT  Multiple bilateral rib fxs w/HTPX s/p bilateral CT  ID - Treatment for MRSA PNA is complete. Pt no longer has an active MRSA  infection. ABL anemia -- stable  UE DVT -- Coumadin - pharmacy adjusting  Urinary retention -- foley out, patient pulled off condom catheter but is voiding spontaneously Agitaiton -- requiring sitter  DM -- Stable for now  FEN --  reume TF - G tube changed to 20 FR foley at the bedside, went easily and flushes well Dispo -- working on placement at [facility] vs outside CIR  LOS: 67 days    Violeta Gelinas, MD, MPH, FACS Pager: [phone]  08/06/2012

## 2012-08-06 NOTE — Progress Notes (Signed)
Occupational Therapy Treatment Patient Details Name: Alex Foster MRN: [id number] DOB: 06/10/1946 Today's Date: 08/06/2012 Time: 4098-1191 OT Time Calculation (min): 17 min  OT Assessment / Plan / Recommendation Comments on Treatment Session Pt alert and up in chair.  Distractible with others in room interfering with ability to follow directions.  Continuing to gain strength and muscle tone in L UE particularly distally.  Pt becomes increasingly more restless with distractions in room    Follow Up Recommendations  SNF    Barriers to Discharge       Equipment Recommendations  None recommended by OT    Recommendations for Other Services    Frequency Min 2X/week   Plan Discharge plan remains appropriate    Precautions / Restrictions Precautions Precautions: Fall Precaution Comments: L shoulder subluxation and L side hemiparesis. Restrictions Weight Bearing Restrictions: No   Pertinent Vitals/Pain L arm with ROM, repositioned    ADL  ADL Comments: Pt up in chair following PT.  Focus of therapy on L shoulder girdle and trunk strengthening.  Pt with c/o pain in L arm with AA/PROM of shoulder, improved with positioning on pillow.    OT Diagnosis:    OT Problem List:   OT Treatment Interventions:     OT Goals ADL Goals Pt Will Perform Grooming: with supervision;Sitting, edge of bed;Unsupported Pt Will Perform Upper Body Bathing: with min assist;Unsupported;Sitting, edge of bed;Other (comment) Miscellaneous OT Goals Miscellaneous OT Goal #1: Pt will follow 2 step commands related to grooming or selfcare with 75% accurracy. OT Goal: Miscellaneous Goal #1 - Progress: Progressing toward goals Miscellaneous OT Goal #2: Pt will transition from supine to sit EOB with mod assist in preparation for selfcare tasks. Miscellaneous OT Goal #3: Pt will maintain static sitting EOB with min assist for 1 minute. OT Goal: Miscellaneous Goal #3 - Progress: Other (comment) Miscellaneous OT Goal  #4: Pt will demonstrate 3/5 strength in supine L UE as a precursor to use of L UE as an assist. OT Goal: Miscellaneous Goal #4 - Progress: Progressing toward goals  Visit Information  Last OT Received On: 08/06/12 Assistance Needed: +2    Subjective Data      Prior Functioning       Cognition  Overall Cognitive Status: Impaired Area of Impairment: Attention;Safety/judgement;Awareness of deficits Arousal/Alertness: Awake/alert Orientation Level: Disoriented to;Place;Time;Situation Behavior During Session: Alert, restless Current Attention Level: Sustained Attention - Other Comments:  Memory Deficits: Only oriented to self today  Following Commands: Follows one step commands inconsistently;Follows one step commands with increased time Safety/Judgement: Decreased awareness of safety precautions;Decreased safety judgement for tasks assessed;Impulsive;Decreased awareness of need for assistance Safety/Judgement - Other Comments: attempting to get up from chair to go to the bathroom, assisted to use urinal from seated position in chair. Awareness of Errors: Assistance required to identify errors made;Assistance required to correct errors made Problem Solving: Max cueing for problem solving with all functional mobility. Cognition - Other Comments: Followed one-step commands inconsistently today due to decreased attention    Mobility  Shoulder Instructions Bed Mobility Bed Mobility: Supine to Sit Supine to Sit: 1: +2 Total assist;HOB flat Supine to Sit: Patient Percentage: 70% Details for Bed Mobility Assistance: Max cues for sequence and for problem solving with functional mobility. Assist also to trunk to translate anterior. Transfers Sit to Stand: 1: +2 Total assist;From bed Sit to Stand: Patient Percentage: 70% Stand to Sit: 1: +2 Total assist;To chair/3-in-1 Stand to Sit: Patient Percentage: 70% Details for Transfer Assistance: Assist at ischials  to translate anterior with cues  for safety and sequence. Facilitation for rotation of pelvis to chair. No buckling in bilateral knees with motion. Max cues for problem solving with mobility.       Exercises      Balance Balance Balance Assessed: Yes Static Sitting Balance Static Sitting - Balance Support: Left upper extremity supported;Feet supported Static Sitting - Level of Assistance: 4: Min assist Static Sitting - Comment/# of Minutes: Pt able to sit EOC for 10 minutes with up to min assist due to left/anterior lean. Cues to correct posture and attain/maintain midline of trunk. Limited by decreased attention. Dynamic Sitting Balance Dynamic Sitting - Balance Support: Left upper extremity supported;Feet supported Dynamic Sitting - Level of Assistance: 4: Min assist Dynamic Sitting - Balance Activities: Forward lean/weight shifting;Lateral lean/weight shifting;Reaching for objects Dynamic Sitting - Comments: Assist for balance during functional activity of reaching for ADL items. Cues for problem solving and assist to maintain midline of trunk.   End of Session OT - End of Session Activity Tolerance: Other (comment) (pt distracted with nursing procedures) Patient left: in chair;with call bell/phone within reach;with nursing in room  GO     Evern Bio 08/06/2012, 3:27 PM [phone]

## 2012-08-07 LAB — GLUCOSE, CAPILLARY
Glucose-Capillary: 106 mg/dL — ABNORMAL HIGH (ref 70–99)
Glucose-Capillary: 116 mg/dL — ABNORMAL HIGH (ref 70–99)
Glucose-Capillary: 135 mg/dL — ABNORMAL HIGH (ref 70–99)
Glucose-Capillary: 189 mg/dL — ABNORMAL HIGH (ref 70–99)

## 2012-08-07 LAB — PROTIME-INR: INR: 3.52 — ABNORMAL HIGH (ref 0.00–1.49)

## 2012-08-07 NOTE — Progress Notes (Addendum)
Patient ID: Alex Foster, male   DOB: 09/26/1946, 66 y.o.   MRN: [id number]     Subjective: No acute changes overnight, moving right arm and leg constantly  Objective: Vital signs in last 24 hours: Temp:  [97.4 F (36.3 C)-97.7 F (36.5 C)] 97.6 F (36.4 C) (11/06 5621) Pulse Rate:  [55-95] 59  (11/06 0757) Resp:  [17-22] 17  (11/06 0757) BP: (108-155)/(45-59) 111/56 mmHg (11/06 0637) SpO2:  [94 %-97 %] 94 % (11/06 0757) FiO2 (%):  [28 %] 28 % (11/06 0757) Weight:  [207 lb 10.8 oz (94.2 kg)] 207 lb 10.8 oz (94.2 kg) (11/05 1814) Last BM Date: 08/02/12  Intake/Output from previous day: 11/05 0701 - 11/06 0700 In: 3327.5 [I.V.:2727.5; NG/GT:600] Out: 700 [Urine:700] Intake/Output this shift:    General appearance: cooperative Neck: trach Resp: clear to auscultation bilaterally Cardio: regular rate and rhythm GI: soft, G tube capped, NT, UH reduces Neuro: F/C  Lab Results: CBC  No results found for this basename: WBC:2,HGB:2,HCT:2,PLT:2 in the last 72 hours BMET No results found for this basename: NA:2,K:2,CL:2,CO2:2,GLUCOSE:2,BUN:2,CREATININE:2,CALCIUM:2 in the last 72 hours PT/INR  Basename 08/07/12 0523 08/05/12 0600  LABPROT 33.3* 30.3*  INR 3.52* 3.10*   ABG No results found for this basename: PHART:2,PCO2:2,PO2:2,HCO3:2 in the last 72 hours  Studies/Results: No results found.  Anti-infectives: Anti-infectives     Start     Dose/Rate Route Frequency Ordered Stop   07/22/12 1000   vancomycin (VANCOCIN) 1,500 mg in sodium chloride 0.9 % 500 mL IVPB        1,500 mg 250 mL/hr over 120 Minutes Intravenous Every 24 hours 07/22/12 0945 08/03/12 1148   06/24/12 1800   levofloxacin (LEVAQUIN) IVPB 750 mg        750 mg 100 mL/hr over 90 Minutes Intravenous Every 24 hours 06/24/12 1539 07/07/12 2015   06/23/12 1000   fluconazole (DIFLUCAN) IVPB 100 mg  Status:  Discontinued        100 mg 50 mL/hr over 60 Minutes Intravenous Every 24 hours 06/22/12 1632 07/01/12  0809   06/23/12 0000   vancomycin (VANCOCIN) 1,500 mg in sodium chloride 0.9 % 500 mL IVPB  Status:  Discontinued        1,500 mg 250 mL/hr over 120 Minutes Intravenous Every 24 hours 06/22/12 1145 06/24/12 1539   06/22/12 1730   fluconazole (DIFLUCAN) IVPB 200 mg        200 mg 100 mL/hr over 60 Minutes Intravenous  Once 06/22/12 1630 06/22/12 1852   06/19/12 2200   vancomycin (VANCOCIN) IVPB 1000 mg/200 mL premix  Status:  Discontinued        1,000 mg 200 mL/hr over 60 Minutes Intravenous Every 12 hours 06/19/12 0940 06/22/12 1123   06/19/12 1000   vancomycin (VANCOCIN) 2,000 mg in sodium chloride 0.9 % 500 mL IVPB        2,000 mg 250 mL/hr over 120 Minutes Intravenous  Once 06/19/12 0940 06/19/12 1300   06/19/12 0930   ceFEPIme (MAXIPIME) 2 g in dextrose 5 % 50 mL IVPB  Status:  Discontinued        2 g 100 mL/hr over 30 Minutes Intravenous 3 times per day 06/19/12 0904 06/24/12 1539   06/04/12 1000   vancomycin (VANCOCIN) IVPB 1000 mg/200 mL premix  Status:  Discontinued        1,000 mg 200 mL/hr over 60 Minutes Intravenous Every 12 hours 06/04/12 0837 06/05/12 1045   06/03/12 1800   vancomycin (VANCOCIN) 1,500 mg  in sodium chloride 0.9 % 500 mL IVPB  Status:  Discontinued        1,500 mg 250 mL/hr over 120 Minutes Intravenous Every 24 hours 06/02/12 1428 06/04/12 0837   06/02/12 1800   vancomycin (VANCOCIN) 2,000 mg in sodium chloride 0.9 % 500 mL IVPB        2,000 mg 250 mL/hr over 120 Minutes Intravenous  Once 06/02/12 1428 06/02/12 2024   06/02/12 1400   cefTAZidime (FORTAZ) 1 g in dextrose 5 % 50 mL IVPB        1 g 100 mL/hr over 30 Minutes Intravenous 3 times per day 06/02/12 1349 06/12/12 0642          Assessment/Plan: s/p Procedure(s): PERCUTANEOUS TRACHEOSTOMY PERCUTANEOUS ENDOSCOPIC GASTROSTOMY (PEG) PLACEMENT [facility]  Respiratory failure -- tolerating HTC 28%  TBI w/ICC -- per NS. Therapies working with pt. Agitation at night, add Ativan PRN and Ambien at  HS CVA -- per NS  Multiple facial fxs -- Non-operative per ENT  Multiple bilateral rib fxs w/HTPX s/p bilateral CT  ID - Treatment for MRSA PNA is complete. Pt no longer has an active MRSA infection. ABL anemia -- stable  UE DVT -- Coumadin - pharmacy adjusting  Urinary retention -- foley out, patient pulled off condom catheter but is voiding spontaneously Agitaiton -- requiring sitter  DM -- Stable for now  FEN --  resume TF - G tube changed to 20 FR foley at the bedside Dispo -- IP CIR to re-eval today  LOS: 68 days    Violeta Gelinas, MD, MPH, FACS Pager: [phone]  08/07/2012   This patient is at his baseline neurologically and does not need a neurology or opthalmology consultation at this point.  These are all chronic issues.  This patient has been seen and I agree with the findings and treatment plan.  Marta Lamas. Gae Bon, MD, FACS [phone] (pager) [phone] (direct pager) Trauma Surgeon
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
## 2012-08-07 NOTE — Progress Notes (Signed)
ANTICOAGULATION CONSULT NOTE - Follow Up Consult  Pharmacy Consult for Coumadin Indication: DVT (RUE)  No Known Allergies  Patient Measurements: Height: 6\' 3"  (190.5 cm) Weight: 207 lb 10.8 oz (94.2 kg) IBW/kg (Calculated) : 84.5  Heparin Dosing Weight:   Vital Signs: Temp: 97.6 F (36.4 C) (11/06 0637) Temp src: Axillary (11/06 0637) BP: 111/56 mmHg (11/06 0637) Pulse Rate: 59  (11/06 0757)  Labs:  Basename 08/07/12 0523 08/05/12 0600  HGB -- --  HCT -- --  PLT -- --  APTT -- --  LABPROT 33.3* 30.3*  INR 3.52* 3.10*  HEPARINUNFRC -- --  CREATININE -- --  CKTOTAL -- --  CKMB -- --  TROPONINI -- --    Estimated Creatinine Clearance: 88.3 ml/min (by C-G formula based on Cr of 1.01).  Assessment: 66yom continuing on Coumadin for RUE DVT. INR (3.52) is supratherapeutic. Despite Coumadin dose held on 11/4 due to malfunctioning PEG and reduced dose on 11/5, INR continues to trend up. Will plan to hold Coumadin tonight and follow daily INRs to monitor trend. - No CBC this AM - No significant bleeding reported  Goal of Therapy:  INR 2-3   Plan:  1. No Coumadin today 2. Daily INR  [name] [phone] 08/07/2012,8:33 AM
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
## 2012-08-08 ENCOUNTER — Inpatient Hospital Stay (HOSPITAL_COMMUNITY)
Admission: RE | Admit: 2012-08-08 | Discharge: 2012-09-11 | DRG: 945 | Disposition: A | Discharge status: Skilled Nursing Facility | Payer: Medicaid Other | Source: Ambulatory Visit | Attending: Physical Medicine & Rehabilitation | Admitting: Physical Medicine & Rehabilitation

## 2012-08-08 ENCOUNTER — Encounter: Payer: Self-pay | Admitting: Family Medicine

## 2012-08-08 DIAGNOSIS — A4902 Methicillin resistant Staphylococcus aureus infection, unspecified site: Secondary | ICD-10-CM | POA: Diagnosis present

## 2012-08-08 DIAGNOSIS — Z5189 Encounter for other specified aftercare: Principal | ICD-10-CM

## 2012-08-08 DIAGNOSIS — J4489 Other specified chronic obstructive pulmonary disease: Secondary | ICD-10-CM | POA: Diagnosis present

## 2012-08-08 DIAGNOSIS — S069X9A Unspecified intracranial injury with loss of consciousness of unspecified duration, initial encounter: Secondary | ICD-10-CM

## 2012-08-08 DIAGNOSIS — J9600 Acute respiratory failure, unspecified whether with hypoxia or hypercapnia: Secondary | ICD-10-CM | POA: Diagnosis present

## 2012-08-08 DIAGNOSIS — S0280XA Fracture of other specified skull and facial bones, unspecified side, initial encounter for closed fracture: Secondary | ICD-10-CM | POA: Diagnosis present

## 2012-08-08 DIAGNOSIS — T1490XA Injury, unspecified, initial encounter: Secondary | ICD-10-CM

## 2012-08-08 DIAGNOSIS — J9383 Other pneumothorax: Secondary | ICD-10-CM | POA: Diagnosis present

## 2012-08-08 DIAGNOSIS — S32009A Unspecified fracture of unspecified lumbar vertebra, initial encounter for closed fracture: Secondary | ICD-10-CM | POA: Diagnosis present

## 2012-08-08 DIAGNOSIS — Z8673 Personal history of transient ischemic attack (TIA), and cerebral infarction without residual deficits: Secondary | ICD-10-CM

## 2012-08-08 DIAGNOSIS — Z931 Gastrostomy status: Secondary | ICD-10-CM

## 2012-08-08 DIAGNOSIS — J9601 Acute respiratory failure with hypoxia: Secondary | ICD-10-CM

## 2012-08-08 DIAGNOSIS — Z930 Tracheostomy status: Secondary | ICD-10-CM

## 2012-08-08 DIAGNOSIS — I252 Old myocardial infarction: Secondary | ICD-10-CM

## 2012-08-08 DIAGNOSIS — J40 Bronchitis, not specified as acute or chronic: Secondary | ICD-10-CM | POA: Diagnosis present

## 2012-08-08 DIAGNOSIS — S2249XA Multiple fractures of ribs, unspecified side, initial encounter for closed fracture: Secondary | ICD-10-CM | POA: Diagnosis present

## 2012-08-08 DIAGNOSIS — J449 Chronic obstructive pulmonary disease, unspecified: Secondary | ICD-10-CM | POA: Diagnosis present

## 2012-08-08 DIAGNOSIS — S020XXA Fracture of vault of skull, initial encounter for closed fracture: Secondary | ICD-10-CM | POA: Diagnosis present

## 2012-08-08 DIAGNOSIS — E119 Type 2 diabetes mellitus without complications: Secondary | ICD-10-CM | POA: Diagnosis present

## 2012-08-08 DIAGNOSIS — F411 Generalized anxiety disorder: Secondary | ICD-10-CM | POA: Diagnosis present

## 2012-08-08 DIAGNOSIS — F17200 Nicotine dependence, unspecified, uncomplicated: Secondary | ICD-10-CM | POA: Diagnosis present

## 2012-08-08 DIAGNOSIS — Z7982 Long term (current) use of aspirin: Secondary | ICD-10-CM

## 2012-08-08 DIAGNOSIS — R339 Retention of urine, unspecified: Secondary | ICD-10-CM | POA: Diagnosis present

## 2012-08-08 LAB — GLUCOSE, CAPILLARY
Glucose-Capillary: 82 mg/dL (ref 70–99)
Glucose-Capillary: 134 mg/dL — ABNORMAL HIGH (ref 70–99)
Glucose-Capillary: 95 mg/dL (ref 70–99)

## 2012-08-08 LAB — PROTIME-INR: INR: 2.35 — ABNORMAL HIGH (ref 0.00–1.49)

## 2012-08-08 MED ORDER — WARFARIN - PHARMACIST DOSING INPATIENT
Freq: Every day | Status: DC
  Administered 2012-08-15: 20:00:00

## 2012-08-08 MED ORDER — CLONAZEPAM 0.5 MG PO TABS: 1.0000 mg | ORAL_TABLET | Freq: Two times a day (BID) | ORAL | Status: DC

## 2012-08-08 MED ORDER — ZOLPIDEM TARTRATE 5 MG PO TABS
10.0000 mg | ORAL_TABLET | Freq: Every evening | ORAL | Status: DC | PRN
  Administered 2012-08-08: 10 mg
  Filled 2012-08-08: qty 2

## 2012-08-08 MED ORDER — PRO-STAT SUGAR FREE PO LIQD
30.0000 mL | Freq: Three times a day (TID) | ORAL | Status: DC
  Administered 2012-08-08 – 2012-08-09 (×2): 30 mL via ORAL
  Filled 2012-08-08 (×5): qty 30

## 2012-08-08 MED ORDER — METFORMIN HCL 500 MG PO TABS
500.0000 mg | ORAL_TABLET | Freq: Two times a day (BID) | ORAL | Status: DC
  Administered 2012-08-08 – 2012-09-11 (×66): 500 mg via ORAL
  Filled 2012-08-08 (×72): qty 1

## 2012-08-08 MED ORDER — ADULT MULTIVITAMIN LIQUID CH: 5.0000 mL | Freq: Every day | ORAL | Status: DC

## 2012-08-08 MED ORDER — PROPRANOLOL HCL 20 MG/5ML PO SOLN
30.0000 mg | Freq: Two times a day (BID) | ORAL | Status: DC
  Administered 2012-08-08 – 2012-09-02 (×50): 30 mg
  Filled 2012-08-08 (×54): qty 7.5

## 2012-08-08 MED ORDER — ACETAMINOPHEN 325 MG PO TABS
325.0000 mg | ORAL_TABLET | ORAL | Status: DC | PRN
  Administered 2012-08-12 – 2012-09-07 (×8): 650 mg via ORAL
  Filled 2012-08-08 (×10): qty 2

## 2012-08-08 MED ORDER — ONDANSETRON HCL 4 MG PO TABS: 4.0000 mg | ORAL_TABLET | Freq: Four times a day (QID) | ORAL | Status: DC | PRN

## 2012-08-08 MED ORDER — INSULIN GLARGINE 100 UNIT/ML ~~LOC~~ SOLN
35.0000 [IU] | Freq: Every day | SUBCUTANEOUS | Status: DC
  Administered 2012-08-09 – 2012-08-12 (×4): 35 [IU] via SUBCUTANEOUS

## 2012-08-08 MED ORDER — CHLORHEXIDINE GLUCONATE 0.12 % MT SOLN
15.0000 mL | Freq: Two times a day (BID) | OROMUCOSAL | Status: DC
  Administered 2012-08-08 – 2012-08-22 (×28): 15 mL via OROMUCOSAL
  Filled 2012-08-08 (×32): qty 15

## 2012-08-08 MED ORDER — BISACODYL 10 MG RE SUPP
10.0000 mg | Freq: Every day | RECTAL | Status: DC | PRN
  Filled 2012-08-08 (×3): qty 1

## 2012-08-08 MED ORDER — FREE WATER: 220.0000 mL | Status: DC

## 2012-08-08 MED ORDER — ACETAMINOPHEN 160 MG/5ML PO SOLN
650.0000 mg | Freq: Four times a day (QID) | ORAL | Status: DC | PRN
  Filled 2012-08-08: qty 20.3

## 2012-08-08 MED ORDER — TAMSULOSIN HCL 0.4 MG PO CAPS
0.8000 mg | ORAL_CAPSULE | Freq: Every day | ORAL | Status: DC
  Administered 2012-08-09 – 2012-08-12 (×4): 0.8 mg via ORAL
  Filled 2012-08-08 (×6): qty 2

## 2012-08-08 MED ORDER — FREE WATER
220.0000 mL | Status: DC
  Administered 2012-08-08 – 2012-08-12 (×24): 220 mL

## 2012-08-08 MED ORDER — ADULT MULTIVITAMIN LIQUID CH
5.0000 mL | Freq: Every day | ORAL | Status: DC
  Administered 2012-08-09: 5 mL
  Filled 2012-08-08 (×2): qty 5

## 2012-08-08 MED ORDER — INSULIN ASPART 100 UNIT/ML ~~LOC~~ SOLN: 0.0000 [IU] | Freq: Every day | SUBCUTANEOUS | Status: DC

## 2012-08-08 MED ORDER — BETHANECHOL CHLORIDE 25 MG PO TABS
25.0000 mg | ORAL_TABLET | Freq: Four times a day (QID) | ORAL | Status: DC
  Administered 2012-08-08 – 2012-08-13 (×20): 25 mg via ORAL
  Filled 2012-08-08 (×28): qty 1

## 2012-08-08 MED ORDER — WARFARIN SODIUM 5 MG PO TABS
5.0000 mg | ORAL_TABLET | Freq: Once | ORAL | Status: DC
  Filled 2012-08-08: qty 1

## 2012-08-08 MED ORDER — WARFARIN SODIUM 5 MG PO TABS
5.0000 mg | ORAL_TABLET | Freq: Once | ORAL | Status: AC
  Administered 2012-08-08: 5 mg via ORAL
  Filled 2012-08-08: qty 1

## 2012-08-08 MED ORDER — SENNOSIDES-DOCUSATE SODIUM 8.6-50 MG PO TABS
1.0000 | ORAL_TABLET | Freq: Every evening | ORAL | Status: DC | PRN
  Administered 2012-09-09: 1 via ORAL
  Filled 2012-08-08 (×3): qty 1

## 2012-08-08 MED ORDER — ALBUTEROL SULFATE (5 MG/ML) 0.5% IN NEBU: 2.5000 mg | INHALATION_SOLUTION | Freq: Four times a day (QID) | RESPIRATORY_TRACT | Status: DC | PRN

## 2012-08-08 MED ORDER — OSMOLITE 1.5 CAL PO LIQD
1000.0000 mL | ORAL | Status: DC
  Administered 2012-08-08: 1000 mL
  Filled 2012-08-08 (×4): qty 1000

## 2012-08-08 MED ORDER — AMANTADINE HCL 100 MG PO CAPS: 100.0000 mg | ORAL_CAPSULE | Freq: Three times a day (TID) | ORAL | Status: DC

## 2012-08-08 MED ORDER — SORBITOL 70 % SOLN
30.0000 mL | Freq: Every day | Status: DC | PRN
  Administered 2012-09-02 – 2012-09-05 (×2): 30 mL via ORAL
  Filled 2012-08-08 (×3): qty 30

## 2012-08-08 MED ORDER — PANTOPRAZOLE SODIUM 40 MG PO PACK: 40.0000 mg | PACK | Freq: Every day | ORAL | Status: DC

## 2012-08-08 MED ORDER — TAMSULOSIN HCL 0.4 MG PO CAPS: 0.8000 mg | ORAL_CAPSULE | Freq: Every day | ORAL | Status: DC

## 2012-08-08 MED ORDER — METFORMIN HCL 500 MG PO TABS: 500.0000 mg | ORAL_TABLET | Freq: Two times a day (BID) | ORAL | Status: DC

## 2012-08-08 MED ORDER — INSULIN GLARGINE 100 UNIT/ML ~~LOC~~ SOLN: 35.0000 [IU] | Freq: Every day | SUBCUTANEOUS | Status: DC

## 2012-08-08 MED ORDER — TRAZODONE HCL 50 MG PO TABS
50.0000 mg | ORAL_TABLET | Freq: Every evening | ORAL | Status: DC | PRN
  Administered 2012-08-13 – 2012-09-04 (×6): 50 mg via ORAL
  Filled 2012-08-08 (×8): qty 1

## 2012-08-08 MED ORDER — ONDANSETRON HCL 4 MG/2ML IJ SOLN: 4.0000 mg | Freq: Four times a day (QID) | INTRAMUSCULAR | Status: DC | PRN

## 2012-08-08 MED ORDER — BIOTENE DRY MOUTH MT LIQD
15.0000 mL | Freq: Four times a day (QID) | OROMUCOSAL | Status: DC
  Administered 2012-08-08 – 2012-09-09 (×70): 15 mL via OROMUCOSAL

## 2012-08-08 MED ORDER — INSULIN ASPART 100 UNIT/ML ~~LOC~~ SOLN
0.0000 [IU] | Freq: Three times a day (TID) | SUBCUTANEOUS | Status: DC
  Administered 2012-08-09: 3 [IU] via SUBCUTANEOUS
  Administered 2012-08-09: 2 [IU] via SUBCUTANEOUS
  Administered 2012-08-10: 3 [IU] via SUBCUTANEOUS
  Administered 2012-08-11: 2 [IU] via SUBCUTANEOUS
  Administered 2012-08-11: 3 [IU] via SUBCUTANEOUS
  Administered 2012-08-11 – 2012-08-12 (×2): 5 [IU] via SUBCUTANEOUS
  Administered 2012-08-12: 3 [IU] via SUBCUTANEOUS

## 2012-08-08 MED ORDER — POTASSIUM CHLORIDE 20 MEQ/15ML (10%) PO LIQD: 20.0000 meq | Freq: Two times a day (BID) | ORAL | Status: DC

## 2012-08-08 MED ORDER — BETHANECHOL CHLORIDE 25 MG PO TABS: 25.0000 mg | ORAL_TABLET | Freq: Four times a day (QID) | ORAL | Status: DC

## 2012-08-08 MED ORDER — INSULIN ASPART 100 UNIT/ML ~~LOC~~ SOLN: 0.0000 [IU] | SUBCUTANEOUS | Status: DC

## 2012-08-08 MED ORDER — IPRATROPIUM BROMIDE 0.02 % IN SOLN: 0.5000 mg | Freq: Four times a day (QID) | RESPIRATORY_TRACT | Status: DC | PRN

## 2012-08-08 MED ORDER — OSMOLITE 1.5 CAL PO LIQD: 1000.0000 mL | ORAL | Status: DC

## 2012-08-08 MED ORDER — PRO-STAT SUGAR FREE PO LIQD: 30.0000 mL | Freq: Three times a day (TID) | ORAL | Status: DC

## 2012-08-08 MED ORDER — POTASSIUM CHLORIDE 20 MEQ/15ML (10%) PO LIQD
20.0000 meq | Freq: Two times a day (BID) | ORAL | Status: DC
  Administered 2012-08-08 – 2012-08-12 (×9): 20 meq
  Filled 2012-08-08 (×12): qty 15

## 2012-08-08 MED ORDER — OXYCODONE HCL 5 MG/5ML PO SOLN
10.0000 mg | Freq: Four times a day (QID) | ORAL | Status: DC
  Administered 2012-08-08 – 2012-08-09 (×2): 10 mg via ORAL
  Filled 2012-08-08 (×2): qty 10

## 2012-08-08 MED ORDER — GUAIFENESIN 100 MG/5ML PO SYRP
20.0000 mL | ORAL_SOLUTION | ORAL | Status: DC | PRN
  Administered 2012-08-13 – 2012-09-04 (×2): 20 mL via ORAL
  Filled 2012-08-08 (×2): qty 118

## 2012-08-08 MED ORDER — CLONAZEPAM 0.5 MG PO TABS
1.0000 mg | ORAL_TABLET | Freq: Two times a day (BID) | ORAL | Status: DC
  Administered 2012-08-08: 1 mg via ORAL
  Filled 2012-08-08: qty 2

## 2012-08-08 MED ORDER — PANTOPRAZOLE SODIUM 40 MG PO PACK
40.0000 mg | PACK | Freq: Every day | ORAL | Status: DC
  Administered 2012-08-09 – 2012-09-02 (×25): 40 mg
  Filled 2012-08-08 (×29): qty 20

## 2012-08-08 MED ORDER — LORAZEPAM 2 MG/ML IJ SOLN
1.0000 mg | INTRAMUSCULAR | Status: DC | PRN
  Administered 2012-08-09: 1 mg via INTRAVENOUS
  Administered 2012-08-11 – 2012-08-12 (×2): 2 mg via INTRAVENOUS
  Filled 2012-08-08 (×5): qty 1

## 2012-08-08 MED ORDER — OXYCODONE HCL 5 MG/5ML PO SOLN: 10.0000 mg | Freq: Four times a day (QID) | ORAL | Status: DC | PRN

## 2012-08-08 MED ORDER — SODIUM CHLORIDE 0.9 % IV SOLN
INTRAVENOUS | Status: DC
  Administered 2012-08-08: 16:00:00 via INTRAVENOUS

## 2012-08-08 MED ORDER — AMANTADINE HCL 50 MG/5ML PO SYRP
100.0000 mg | ORAL_SOLUTION | Freq: Three times a day (TID) | ORAL | Status: DC
  Administered 2012-08-08 (×2): 100 mg
  Filled 2012-08-08 (×6): qty 10

## 2012-08-08 NOTE — Progress Notes (Signed)
Occupational Therapy Treatment Note  08/08/12 1600  OT Visit Information  Last OT Received On 08/08/12  Assistance Needed +2  PT/OT Co-Evaluation/Treatment Yes  OT Time Calculation  OT Start Time 1340  OT Stop Time 1403  OT Time Calculation (min) 23 min  Precautions  Precautions Fall  Precaution Comments L shoulder subluxation and L side hemiparesis.  ADL  Upper Body Dressing Performed;+1 Total assistance  Where Assessed - Upper Body Dressing Supine, head of bed up  Lower Body Dressing Performed;+1 Total assistance  Where Assessed - Lower Body Dressing Supported sitting  Toilet Transfer Simulated;+2 Total assistance  Toilet Transfer: Patient Percentage 40%  Toilet Transfer Method Sit to stand  Toilet Transfer Equipment Other (comment) (bed)  Equipment Used Gait belt  Transfers/Ambulation Related to ADLs +2 assist for sit<>stand from bed  ADL Comments Focus of session on bed mobility and static sitting balance.  Pt requried max verbal and tactile cueing to intitiate task of looking at item or therapist but unable to keep attention focused or to track item/therapist.  No active movement noted in LUE throughout session. LUE supported during sitting balance and sit<>stand to assist in minimizing subluxation. Pt also fatigued from sponge bath from NT prior to OT arrival.  Cognition  Overall Cognitive Status Impaired  Area of Impairment Attention;Following commands;Safety/judgement  Arousal/Alertness Awake/alert  Orientation Level Disoriented X4  Behavior During Session Lethargic  Current Attention Level Sustained  Following Commands Follows one step commands inconsistently  Safety/Judgement Decreased awareness of safety precautions;Decreased safety judgement for tasks assessed;Impulsive;Decreased awareness of need for assistance  Problem Solving Max cueing for problem solving with all functional mobility.  Bed Mobility  Bed Mobility Supine to Sit;Sit to Supine;Sitting - Scoot to [place]  of Bed;Scooting to HOB  Supine to Sit 1: +2 Total assist  Supine to Sit: Patient Percentage 10%  Sitting - Scoot to Edge of Bed 1: +2 Total assist  Sitting - Scoot to Edge of Bed: Patient Percentage 0%  Sit to Supine 1: +2 Total assist  Sit to Supine: Patient Percentage 0%  Scooting to HOB 1: +2 Total assist  Scooting to [facility]: Patient Percentage 0%  Details for Bed Mobility Assistance (A) for all components of transfer.  No active participation noted today.    Transfers  Transfers Sit to Stand;Stand to Sit  Sit to Stand 1: +2 Total assist;From bed  Sit to Stand: Patient Percentage 40%  Stand to Sit 1: +2 Total assist;To bed  Stand to Sit: Patient Percentage 20%  Details for Transfer Assistance (A) to lift hips off of bed & to achieve erect posture as well as controlled descent to bed.  Pt required increased (A) today compared to last PT session  Static Sitting Balance  Static Sitting - Balance Support Feet supported;Left upper extremity supported  Static Sitting - Level of Assistance 3: Mod assist  Static Sitting - Comment/# of Minutes Continuous manual cueing to trunk and sternal region to maintain midline position. Frequently leans left and anteriorly but began leaning in all directions as he became more fatigued. Sat EOB ~10 min.  OT - End of Session  Equipment Utilized During Treatment Gait belt  Activity Tolerance Patient limited by fatigue  Patient left in bed;with call bell/phone within reach  Nurse Communication Mobility status  OT Assessment/Plan  Comments on Treatment Session Pt requiring increased assist with static sitting balance and sit<>stand.  Anticipate admission to CIR today,  OT Plan Discharge plan remains appropriate  OT Frequency Min 2X/week  Recommendations for Other Services Rehab consult  Follow Up Recommendations SNF  Equipment Recommended None recommended by OT  Miscellaneous OT Goals  Miscellaneous OT Goal #1 Pt will follow 2 step commands related to  grooming or selfcare with 75% accurracy.  OT Goal: Miscellaneous Goal #1 - Progress Progressing toward goals  Miscellaneous OT Goal #3 Pt will maintain static sitting EOB with min assist for 1 minute.  OT Goal: Miscellaneous Goal #3 - Progress Progressing toward goals  OT General Charges  $OT Visit 1 Procedure  OT Treatments  $Therapeutic Activity 8-22 mins  $Neuromuscular Re-education 8-22 mins    08/08/2012 Cipriano Mile OTR/L Pager [phone] Office [phone]

## 2012-08-08 NOTE — Progress Notes (Addendum)
Rehab admissions - Evaluated for possible admission.  I called patient's brother, Duffy Rhody, and discussed plans for rehab.  He is in agreement to inpatient rehab admission with goals to lesson burden of care.  Patient to discharge after inpatient rehab to SNF if needed.  Family interested in the [place], [place] area for placement if needed.  I talked with Dr. Wynn Banker and shared trauma MD view that patient does not currently need a neuro or opthal consult.  Bed available today and I have approval from Dr. Wynn Banker to admit to inpatient rehab today.  Call me for questions.  #[phone]
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
## 2012-08-08 NOTE — Discharge Instructions (Signed)
Continue PT and OT
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
## 2012-08-08 NOTE — Clinical Social Work Note (Signed)
Clinical Social Worker has been following for support and discharge planning needs.  Patient has been accepted by [facility] for admission today.  CSW spoke with patient brother, Alex Foster over the phone who is agreeable to inpatient rehab transfer with follow up at skilled nursing facility in [place] if needed.  Patient brother preference is for [facility] first and then [facility] second both located in [place], [place].  CSW will follow up with CSW at inpatient rehab to relay information.  Clinical Social Worker will sign off for now as social work intervention is no longer needed. Please consult [place] again if new need arises.  Macario Golds, [place] [phone]

## 2012-08-08 NOTE — Discharge Summary (Signed)
Harol Shabazz, MD, MPH, FACS Pager: [phone]  

## 2012-08-08 NOTE — Progress Notes (Signed)
Physical Therapy Treatment Patient Details Name: Maveryck Bahri MRN: [id number] DOB: 1946-02-05 Today's Date: 08/08/2012 Time: 1340-1403 PT Time Calculation (min): 23 min  PT Assessment / Plan / Recommendation Comments on Treatment Session  Pt required increased (A) for mobility today.  Pt d/cing to CIR today.      Follow Up Recommendations        Does the patient have the potential to tolerate intense rehabilitation     Barriers to Discharge        Equipment Recommendations  None recommended by OT    Recommendations for Other Services Rehab consult  Frequency Min 3X/week   Plan Frequency remains appropriate;Discharge plan remains appropriate    Precautions / Restrictions Precautions Precautions: Fall Precaution Comments: L shoulder subluxation and L side hemiparesis. Restrictions Weight Bearing Restrictions: No       Mobility  Bed Mobility Bed Mobility: Supine to Sit;Sit to Supine;Sitting - Scoot to [place] of Bed;Scooting to HOB Supine to Sit: 1: +2 Total assist Supine to Sit: Patient Percentage: 10% Sitting - Scoot to Edge of Bed: 1: +2 Total assist Sitting - Scoot to Edge of Bed: Patient Percentage: 0% Sit to Supine: 1: +2 Total assist Sit to Supine: Patient Percentage: 0% Scooting to HOB: 1: +2 Total assist Scooting to [facility]: Patient Percentage: 0% Details for Bed Mobility Assistance: (A) for all components of transfer.  No active participation noted today.   Transfers Transfers: Stand to Sit;Sit to Stand Sit to Stand: 1: +2 Total assist;From bed Sit to Stand: Patient Percentage: 40% Stand to Sit: 1: +2 Total assist;To bed Stand to Sit: Patient Percentage: 20% Details for Transfer Assistance: (A) to lift hips off of bed & to achieve erect posture as well as controlled descent to bed.  Pt required increased (A) today compared to last PT session Ambulation/Gait Ambulation/Gait Assistance: Not tested (comment) Stairs: No Wheelchair Mobility Wheelchair Mobility: No        PT Goals Acute Rehab PT Goals Time For Goal Achievement: 08/20/12 Potential to Achieve Goals: Good Pt will go Supine/Side to Sit: with supervision PT Goal: Supine/Side to Sit - Progress: Not progressing Pt will Sit at [facility] of Bed: with supervision;6-10 min;with unilateral upper extremity support PT Goal: Sit at Edge Of Bed - Progress: Not met Pt will go Sit to Stand: with upper extremity assist;with mod assist PT Goal: Sit to Stand - Progress: Not progressing Pt will Transfer Bed to Chair/Chair to Bed: with mod assist Additional Goals Additional Goal #1: Pt. will participate in pregait activity at EOB with total a +2 (pt=40%) Additional Goal #2: Pt will demonstrate selective attention in minimally distracting environment throughout session. PT Goal: Additional Goal #2 - Progress: Not progressing  Visit Information  Last PT Received On: 08/08/12 Assistance Needed: +2    Subjective Data  Subjective: Some verbalization during therapy  Patient Stated Goal: not able to participate   Cognition  Overall Cognitive Status: Impaired Area of Impairment: Attention;Following commands;Safety/judgement Arousal/Alertness: Awake/alert Orientation Level: Disoriented X4 Behavior During Session: Lethargic Current Attention Level: Sustained Following Commands: Follows one step commands inconsistently Safety/Judgement: Decreased awareness of safety precautions;Decreased safety judgement for tasks assessed;Impulsive;Decreased awareness of need for assistance Problem Solving: Max cueing for problem solving with all functional mobility.    Balance     End of Session PT - End of Session Activity Tolerance: Patient limited by fatigue Patient left: in bed;with call bell/phone within reach;with nursing in room     [place], [place] [phone] 08/08/2012

## 2012-08-08 NOTE — Progress Notes (Signed)
Patient admitted to 4002 at 1500. Patient lethargic, disoriented x3, unable to follow commands. Patient did respond to sounds and to his name. Unable to follow with eyes. Patient had been given 2mg  IV ativan before coming to 4000, per 4N RN report. PEG site clean dry intact, infusing. IV NS infusion stopped and site saline locked after admission per orders. Patient able to move all extremities. Very limited movement in LUE. Bilateral soft wrist restraints applied per order. Patient has #8 shiley uncuffed on continuous pulse ox, O2 sats 100 at 5L and FiO2 28% at this time. Patient resting at this time. Hedy Camara

## 2012-08-08 NOTE — Progress Notes (Signed)
Report called to Gracie on 4000. Pt d/c to 4002. Vitals stable, trach suctioned prior to transport.
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
## 2012-08-08 NOTE — Progress Notes (Signed)
Patient ID: Alex Foster, male   DOB: August 25, 1946, 66 y.o.   MRN: [id number]    Subjective: Just got some Ativan for agitation  Objective: Vital signs in last 24 hours: Temp:  [97.5 F (36.4 C)-97.7 F (36.5 C)] 97.7 F (36.5 C) (11/07 0549) Pulse Rate:  [54-68] 68  (11/07 0549) Resp:  [16-20] 20  (11/07 0549) BP: (130-155)/(58-78) 142/75 mmHg (11/07 0549) SpO2:  [90 %-100 %] 100 % (11/07 0549) FiO2 (%):  [28 %-29 %] 28 % (11/07 0035) Last BM Date:  (PTA)  Intake/Output from previous day: 11/06 0701 - 11/07 0700 In: -  Out: 1800 [Urine:1800] Intake/Output this shift:    Neck: trach Resp: clear to auscultation bilaterally Cardio: regular rate and rhythm GI: soft, UH reduces, G tube in place, binder, NT Neuro: looks to voice, could reach out and touch my hand to command  Lab Results: CBC  No results found for this basename: WBC:2,HGB:2,HCT:2,PLT:2 in the last 72 hours BMET No results found for this basename: NA:2,K:2,CL:2,CO2:2,GLUCOSE:2,BUN:2,CREATININE:2,CALCIUM:2 in the last 72 hours PT/INR  Basename 08/08/12 0455 08/07/12 0523  LABPROT 24.7* 33.3*  INR 2.35* 3.52*   ABG No results found for this basename: PHART:2,PCO2:2,PO2:2,HCO3:2 in the last 72 hours  Studies/Results: No results found.  Anti-infectives: Anti-infectives     Start     Dose/Rate Route Frequency Ordered Stop   07/22/12 1000   vancomycin (VANCOCIN) 1,500 mg in sodium chloride 0.9 % 500 mL IVPB        1,500 mg 250 mL/hr over 120 Minutes Intravenous Every 24 hours 07/22/12 0945 08/03/12 1148   06/24/12 1800   levofloxacin (LEVAQUIN) IVPB 750 mg        750 mg 100 mL/hr over 90 Minutes Intravenous Every 24 hours 06/24/12 1539 07/07/12 2015   06/23/12 1000   fluconazole (DIFLUCAN) IVPB 100 mg  Status:  Discontinued        100 mg 50 mL/hr over 60 Minutes Intravenous Every 24 hours 06/22/12 1632 07/01/12 0809   06/23/12 0000   vancomycin (VANCOCIN) 1,500 mg in sodium chloride 0.9 % 500 mL IVPB   Status:  Discontinued        1,500 mg 250 mL/hr over 120 Minutes Intravenous Every 24 hours 06/22/12 1145 06/24/12 1539   06/22/12 1730   fluconazole (DIFLUCAN) IVPB 200 mg        200 mg 100 mL/hr over 60 Minutes Intravenous  Once 06/22/12 1630 06/22/12 1852   06/19/12 2200   vancomycin (VANCOCIN) IVPB 1000 mg/200 mL premix  Status:  Discontinued        1,000 mg 200 mL/hr over 60 Minutes Intravenous Every 12 hours 06/19/12 0940 06/22/12 1123   06/19/12 1000   vancomycin (VANCOCIN) 2,000 mg in sodium chloride 0.9 % 500 mL IVPB        2,000 mg 250 mL/hr over 120 Minutes Intravenous  Once 06/19/12 0940 06/19/12 1300   06/19/12 0930   ceFEPIme (MAXIPIME) 2 g in dextrose 5 % 50 mL IVPB  Status:  Discontinued        2 g 100 mL/hr over 30 Minutes Intravenous 3 times per day 06/19/12 0904 06/24/12 1539   06/04/12 1000   vancomycin (VANCOCIN) IVPB 1000 mg/200 mL premix  Status:  Discontinued        1,000 mg 200 mL/hr over 60 Minutes Intravenous Every 12 hours 06/04/12 0837 06/05/12 1045   06/03/12 1800   vancomycin (VANCOCIN) 1,500 mg in sodium chloride 0.9 % 500 mL IVPB  Status:  Discontinued        1,500 mg 250 mL/hr over 120 Minutes Intravenous Every 24 hours 06/02/12 1428 06/04/12 0837   06/02/12 1800   vancomycin (VANCOCIN) 2,000 mg in sodium chloride 0.9 % 500 mL IVPB        2,000 mg 250 mL/hr over 120 Minutes Intravenous  Once 06/02/12 1428 06/02/12 2024   06/02/12 1400   cefTAZidime (FORTAZ) 1 g in dextrose 5 % 50 mL IVPB        1 g 100 mL/hr over 30 Minutes Intravenous 3 times per day 06/02/12 1349 06/12/12 0642          Assessment/Plan: s/p Procedure(s): PERCUTANEOUS TRACHEOSTOMY PERCUTANEOUS ENDOSCOPIC GASTROSTOMY (PEG) PLACEMENT [facility]  Respiratory failure -- tolerating HTC 28%  TBI w/ICC -- per NS. Therapies working with pt. Agitation at night, add Ativan PRN and Ambien at HS, amantadine and propranolol CVA -- per NS  Multiple facial fxs -- Non-operative per ENT    Multiple bilateral rib fxs w/HTPX s/p bilateral CT  ABL anemia -- stable  UE DVT -- Coumadin - pharmacy adjusting  Urinary retention -- foley out, patient pulled off condom catheter but is voiding spontaneously Agitaiton -- requiring sitter  DM -- Stable for now  FEN --  TF via 30fr Foley G tube Dispo -- possible CIR  LOS: 69 days    Violeta Gelinas, MD, MPH, FACS Pager: [phone]  08/08/2012

## 2012-08-09 ENCOUNTER — Encounter (HOSPITAL_COMMUNITY): Payer: Self-pay | Admitting: Occupational Therapy

## 2012-08-09 ENCOUNTER — Inpatient Hospital Stay (HOSPITAL_COMMUNITY): Payer: Medicaid Other | Admitting: Speech Pathology

## 2012-08-09 ENCOUNTER — Inpatient Hospital Stay (HOSPITAL_COMMUNITY): Payer: Medicaid Other | Admitting: Physical Therapy

## 2012-08-09 ENCOUNTER — Inpatient Hospital Stay (HOSPITAL_COMMUNITY): Payer: Medicaid Other | Admitting: *Deleted

## 2012-08-09 DIAGNOSIS — T1490XA Injury, unspecified, initial encounter: Secondary | ICD-10-CM

## 2012-08-09 DIAGNOSIS — S069X9A Unspecified intracranial injury with loss of consciousness of unspecified duration, initial encounter: Secondary | ICD-10-CM

## 2012-08-09 LAB — CBC WITH DIFFERENTIAL/PLATELET
Basophils Absolute: 0.1 10*3/uL (ref 0.0–0.1)
Basophils Relative: 1 % (ref 0–1)
Eosinophils Relative: 9 % — ABNORMAL HIGH (ref 0–5)
Lymphocytes Relative: 41 % (ref 12–46)
MCHC: 31.8 g/dL (ref 30.0–36.0)
MCV: 84.9 fL (ref 78.0–100.0)
Monocytes Absolute: 0.6 10*3/uL (ref 0.1–1.0)
Platelets: 161 10*3/uL (ref 150–400)
RDW: 15.2 % (ref 11.5–15.5)
WBC: 4.7 10*3/uL (ref 4.0–10.5)

## 2012-08-09 LAB — COMPREHENSIVE METABOLIC PANEL
ALT: 10 U/L (ref 0–53)
AST: 18 U/L (ref 0–37)
Albumin: 2.7 g/dL — ABNORMAL LOW (ref 3.5–5.2)
CO2: 31 mEq/L (ref 19–32)
Calcium: 8.9 mg/dL (ref 8.4–10.5)
Creatinine, Ser: 0.82 mg/dL (ref 0.50–1.35)
GFR calc non Af Amer: >90 mL/min (ref >90)
Sodium: 136 mEq/L (ref 135–145)
Total Protein: 6.8 g/dL (ref 6.0–8.3)

## 2012-08-09 LAB — GLUCOSE, CAPILLARY
Glucose-Capillary: 132 mg/dL — ABNORMAL HIGH (ref 70–99)
Glucose-Capillary: 161 mg/dL — ABNORMAL HIGH (ref 70–99)
Glucose-Capillary: 42 mg/dL — CL (ref 70–99)
Glucose-Capillary: 107 mg/dL — ABNORMAL HIGH (ref 70–99)
Glucose-Capillary: 158 mg/dL — ABNORMAL HIGH (ref 70–99)

## 2012-08-09 LAB — PROTIME-INR: INR: 1.66 — ABNORMAL HIGH (ref 0.00–1.49)

## 2012-08-09 MED ORDER — WARFARIN SODIUM 5 MG PO TABS
5.0000 mg | ORAL_TABLET | Freq: Once | ORAL | Status: AC
  Administered 2012-08-09: 5 mg
  Filled 2012-08-09: qty 1

## 2012-08-09 MED ORDER — DEXTROSE 50 % IV SOLN
INTRAVENOUS | Status: AC
  Administered 2012-08-09: 16:00:00
  Filled 2012-08-09: qty 50

## 2012-08-09 MED ORDER — QUETIAPINE FUMARATE 25 MG PO TABS
25.0000 mg | ORAL_TABLET | Freq: Every day | ORAL | Status: DC
  Administered 2012-08-09 – 2012-08-17 (×9): 25 mg via ORAL
  Filled 2012-08-09 (×10): qty 1

## 2012-08-09 MED ORDER — OSMOLITE 1.5 CAL PO LIQD
237.0000 mL | Freq: Three times a day (TID) | ORAL | Status: DC
  Administered 2012-08-09 – 2012-08-12 (×9): 237 mL
  Filled 2012-08-09 (×14): qty 237

## 2012-08-09 MED ORDER — OSMOLITE 1.5 CAL PO LIQD: 237.0000 mL | Freq: Three times a day (TID) | ORAL | Status: DC

## 2012-08-09 MED ORDER — PRO-STAT SUGAR FREE PO LIQD
30.0000 mL | Freq: Three times a day (TID) | ORAL | Status: DC
  Administered 2012-08-09 – 2012-08-20 (×33): 30 mL
  Filled 2012-08-09 (×36): qty 30

## 2012-08-09 MED ORDER — ALPRAZOLAM 0.5 MG PO TABS
0.5000 mg | ORAL_TABLET | Freq: Three times a day (TID) | ORAL | Status: DC | PRN
  Filled 2012-08-09: qty 1

## 2012-08-09 MED ORDER — CITALOPRAM HYDROBROMIDE 10 MG/5ML PO SOLN
10.0000 mg | Freq: Every day | ORAL | Status: DC
  Administered 2012-08-09 – 2012-09-02 (×25): 10 mg
  Filled 2012-08-09 (×29): qty 10

## 2012-08-09 MED ORDER — OSMOLITE 1.5 CAL PO LIQD
1000.0000 mL | ORAL | Status: DC
  Administered 2012-08-09 – 2012-08-10 (×2): 1000 mL
  Filled 2012-08-09 (×8): qty 1000

## 2012-08-09 MED ORDER — OXYCODONE HCL 5 MG/5ML PO SOLN
10.0000 mg | ORAL | Status: DC | PRN
  Administered 2012-08-09 – 2012-09-02 (×55): 10 mg via ORAL
  Filled 2012-08-09 (×11): qty 10
  Filled 2012-08-09 (×2): qty 5
  Filled 2012-08-09: qty 10
  Filled 2012-08-09: qty 5
  Filled 2012-08-09: qty 10
  Filled 2012-08-09: qty 5
  Filled 2012-08-09 (×33): qty 10
  Filled 2012-08-09: qty 5
  Filled 2012-08-09 (×2): qty 10
  Filled 2012-08-09: qty 5
  Filled 2012-08-09 (×6): qty 10

## 2012-08-09 MED ORDER — OSMOLITE 1.5 CAL PO LIQD
1000.0000 mL | ORAL | Status: DC
  Filled 2012-08-09: qty 1000

## 2012-08-09 MED ORDER — AMANTADINE HCL 50 MG/5ML PO SYRP
100.0000 mg | ORAL_SOLUTION | Freq: Two times a day (BID) | ORAL | Status: DC
  Administered 2012-08-09 – 2012-09-02 (×49): 100 mg
  Filled 2012-08-09 (×54): qty 10

## 2012-08-09 MED ORDER — CLONAZEPAM 0.5 MG PO TABS
0.5000 mg | ORAL_TABLET | Freq: Two times a day (BID) | ORAL | Status: DC
  Administered 2012-08-09 – 2012-08-12 (×8): 0.5 mg via ORAL
  Filled 2012-08-09 (×8): qty 1

## 2012-08-09 MED ORDER — DEXTROSE 50 % IV SOLN: 25.0000 mL | Freq: Once | INTRAVENOUS | Status: AC | PRN

## 2012-08-09 MED ORDER — OSMOLITE 1.5 CAL PO LIQD
237.0000 mL | Freq: Every day | ORAL | Status: DC
  Filled 2012-08-09 (×8): qty 237
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
## 2012-08-09 NOTE — Progress Notes (Signed)
Hypoglycemic Event  CBG: 42  Treatment: D50 IV 50 mL  Symptoms: Pale and Sweaty  Follow-up CBG: Time:1540- 43 1550-158 CBG Result  Possible Reasons for Event:   Comments/MD notified Threasa Beards PA notifies @1540     Lavell Luster, Tinzlee Craker D  Remember to initiate Hypoglycemia Order Set & complete

## 2012-08-09 NOTE — Progress Notes (Signed)
Patient information reviewed and entered into eRehab system by Georgena Weisheit, RN, CRRN, PPS Coordinator.  Information including medical coding and functional independence measure will be reviewed and updated through discharge.    

## 2012-08-09 NOTE — Progress Notes (Addendum)
Physical Therapy Session Note  Patient Details  Name: Alex Foster MRN: [id number] Date of Birth: [birth year]  Today's Date: 08/09/2012 Time: 1405-1500 Time Calculation (min): 55 min  Short Term Goals: Week 1:  PT Short Term Goal 1 (Week 1): Pt will perform bed mobility with Mod A  PT Short Term Goal 2 (Week 1): Pt will perform bed<>chair transfer with Max A PT Short Term Goal 3 (Week 1): Pt will propell WC x 25' with Min A  PT Short Term Goal 4 (Week 1): Pt will demonstrate unsupported dynamic sitting balance with Min A for  Skilled Therapeutic Interventions/Progress Updates:  Tx focused on transfer training, WC mobility, bed mobility, and sitting balance. PMSV in place, and pt able to answer questions this tx. VSS throughout on 4L 02, >94% and HR 70's.  Pt oriented to self at this time and responds appropriately to simple questions 50% of the time.   Stand-step transfers x2 with +2 total A, pt 30% with step-by-step cues for sequence and technique. Pt unable to achieve full trunk ext in standing, but not able to coordinate squat transfer. Assist needed for lifting and turning hips. Pt able to assist with strong RUE.   Pt educated on WC parts and propulsion, able to propel 2x15' with hemi-technique and Mod A for advancement and cues for increasing R grip and timing R LE. Although pt has strength in LLE, unable to incorporate functionally. Decreased attention to task also limiting locomotion.   Supported sitting hitting balloon to further assess vision and coordination. Pt able to see balloon once ~2' from face and able to hit fairly accurately with RUE.  Unsupported sitting EOB x23min with and without RUE support, reaching to target inside BOS for challenge. Pt had several instances of falling L, but able to catch self with trunk control and Min A for safety. Pt unable to maintain midline orientation and static sitting balance.  Sit>supine fairly automatic with Min A only needed. +2  assist needed to scoot to [facility].       Therapy Documentation Precautions:  Precautions Precautions: Fall Precaution Comments: L shoulder subluxation and L side hemiparesis. Peg tube, trach  Restrictions Weight Bearing Restrictions: No   Vital Signs: 02 94% on 4L with PMSV  Pain: No complaints    See FIM for current functional status  Therapy/Group: Individual Therapy  Virl Cagey, PT 08/09/2012, 3:18 PM
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
## 2012-08-09 NOTE — Progress Notes (Signed)
ANTICOAGULATION CONSULT NOTE - Follow Up Consult  Pharmacy Consult:  Coumadin Indication: RUE DVT  Allergies  Allergen Reactions  . Penicillins     Patient Measurements:    Vital Signs: Temp: 97.9 F (36.6 C) (11/08 0640) Temp src: Oral (11/08 0640) BP: 156/78 mmHg (11/08 0640) Pulse Rate: 72  (11/08 0843)  Labs:  Basename 08/09/12 0505 08/08/12 0455 08/07/12 0523  HGB 10.2* -- --  HCT 32.1* -- --  PLT 161 -- --  APTT -- -- --  LABPROT 19.1* 24.7* 33.3*  INR 1.66* 2.35* 3.52*  HEPARINUNFRC -- -- --  CREATININE 0.82 -- --  CKTOTAL -- -- --  CKMB -- -- --  TROPONINI -- -- --    The CrCl is unknown because both a height and weight (above a minimum accepted value) are required for this calculation.    Marland Kitchen amantadine  100 mg Per Tube BID  . antiseptic oral rinse  15 mL Mouth Rinse QID  . bethanechol  25 mg Oral QID  . chlorhexidine  15 mL Mouth Rinse BID  . citalopram  10 mg Per Tube Daily  . clonazePAM  0.5 mg Oral BID  . feeding supplement  30 mL Oral TID WC  . free water  220 mL Per Tube Q4H  . insulin aspart  0-15 Units Subcutaneous TID WC  . insulin aspart  0-5 Units Subcutaneous QHS  . insulin glargine  35 Units Subcutaneous Daily  . metFORMIN  500 mg Oral BID WC  . multivitamin  5 mL Per Tube Daily  . pantoprazole sodium  40 mg Per Tube Q1200  . potassium chloride  20 mEq Per Tube BID  . propranolol  30 mg Per Tube Q12H  . QUEtiapine  25 mg Oral QHS  . Tamsulosin HCl  0.8 mg Oral Daily  . [COMPLETED] warfarin  5 mg Oral ONCE-1800  . Warfarin - Pharmacist Dosing Inpatient   Does not apply q1800  . [DISCONTINUED] amantadine  100 mg Per Tube TID  . [DISCONTINUED] amantadine  100 mg Per Tube TID  . [DISCONTINUED] bethanechol  25 mg Oral QID  . [DISCONTINUED] clonazePAM  1 mg Per Tube BID  . [DISCONTINUED] clonazePAM  1 mg Oral BID  . [DISCONTINUED] feeding supplement  30 mL Per Tube TID WC  . [DISCONTINUED] free water  220 mL Per Tube Q4H  .  [DISCONTINUED] insulin aspart  0-15 Units Subcutaneous Q4H  . [DISCONTINUED] insulin glargine  35 Units Subcutaneous Daily  . [DISCONTINUED] metFORMIN  500 mg Oral BID WC  . [DISCONTINUED] multivitamin  5 mL Per Tube Daily  . [DISCONTINUED] oxyCODONE  10 mg Oral Q6H  . [DISCONTINUED] pantoprazole sodium  40 mg Per Tube Daily  . [DISCONTINUED] potassium chloride  20 mEq Per Tube BID  . [DISCONTINUED] Tamsulosin HCl  0.8 mg Oral Daily     Assessment: 66 YOM continuing on Coumadin for RUE DVT. INR decreased to a subtherapeutic level today, likely due to held doses 11/4 and 11/6.   Goal of Therapy: INR 2-3  Plan: Continue with Coumadin 5mg  today Daily PT/INR monitoring  Estella Husk, Pharm.D., BCPS Clinical Pharmacist  Phone [phone] Pager [phone] 08/09/2012, 9:14 AM

## 2012-08-09 NOTE — Plan of Care (Signed)
Problem: RH BLADDER ELIMINATION Goal: RH STG MANAGE BLADDER WITH ASSISTANCE STG Manage Bladder With max Assistance  Outcome: Not Progressing Patient with urinary incontinence with condom catheter at hs. A. Isaid Salvia,LPN
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
## 2012-08-09 NOTE — Progress Notes (Signed)
Physical Therapy Session Note  Patient Details  Name: Alex Foster MRN: [id number] Date of Birth: Nov 04, 1945  Today's Date: 08/09/2012 Time: 4098-1191 Time Calculation (min): 45 min  Short Term Goals: Week 1:  PT Short Term Goal 1 (Week 1): Pt will perform bed mobility with Mod A  PT Short Term Goal 2 (Week 1): Pt will perform bed<>chair transfer with Max A PT Short Term Goal 3 (Week 1): Pt will propell WC x 25' with Min A  PT Short Term Goal 4 (Week 1): Pt will demonstrate unsupported dynamic sitting balance with Min A for  Skilled Therapeutic Interventions/Progress Updates:    Pt very restless at start of session, bicycle - like movements of bil. LEs in air. Pt able to reach trach tubing although bil. UE restrained as he had slide down in bed and was pulling at it, SpO2 down to 92%. RN reporting pt had received Ativan.  Attempted activities to engage pt's attention, pt unable to follow one step commands this session however does grunt when called "[name]." Pt had significant urinary incontinence requiring full bed change, 4 people eventually needed to complete change safely secondary to pt's restlessness. Pt performed bed mobility with +2 total assist and sit <> stands with +2 total assist, pt = ~20% for each however contributory actions appear more automatic than purposeful. Pt not able to initiate functional movement. Pt swinging Rt. Arm at times as if trying to hit however does not appear to have a target and does not overtly appear agitated. RN in room for second half of session. Pt positioned for optimal comfort and restraints reapplied.   Therapy Documentation Precautions:  Precautions Precautions: Fall Precaution Comments: L shoulder subluxation and L side hemiparesis. Peg tube, trach  Restrictions Weight Bearing Restrictions: No Pain: Critical Care Pain Observation Tool (CPOT) Facial Expression: Tense Body Movements: Restlessness Muscle Tension: Relaxed Compliance with  ventilator (intubated pts.): Tolerating ventilator or movement Vocalization (extubated pts.): Sighing, moaning CPOT Total: 4   See FIM for current functional status  Therapy/Group: Individual Therapy  Wilhemina Bonito 08/09/2012, 5:25 PM

## 2012-08-10 ENCOUNTER — Encounter (HOSPITAL_COMMUNITY): Payer: Self-pay | Admitting: Occupational Therapy

## 2012-08-10 ENCOUNTER — Inpatient Hospital Stay (HOSPITAL_COMMUNITY): Payer: Self-pay

## 2012-08-10 ENCOUNTER — Inpatient Hospital Stay (HOSPITAL_COMMUNITY): Payer: Self-pay | Admitting: Physical Therapy

## 2012-08-10 LAB — GLUCOSE, CAPILLARY
Glucose-Capillary: 114 mg/dL — ABNORMAL HIGH (ref 70–99)
Glucose-Capillary: 117 mg/dL — ABNORMAL HIGH (ref 70–99)
Glucose-Capillary: 137 mg/dL — ABNORMAL HIGH (ref 70–99)
Glucose-Capillary: 153 mg/dL — ABNORMAL HIGH (ref 70–99)

## 2012-08-10 MED ORDER — WARFARIN SODIUM 7.5 MG PO TABS
7.5000 mg | ORAL_TABLET | Freq: Once | ORAL | Status: AC
  Administered 2012-08-10: 7.5 mg via ORAL
  Filled 2012-08-10: qty 1

## 2012-08-10 NOTE — Progress Notes (Signed)
Orthopedic Tech Progress Note Patient Details:  Alex Foster 03-30-1946 [id number] Abdominal binder order dating back to 08/08/2012 delivered to nurse. Order hadn't been filled.  Ortho Devices Type of Ortho Device: Abdominal binder Ortho Device/Splint Interventions: Ordered   [place] R Thompson 08/10/2012, 8:29 AM
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
## 2012-08-10 NOTE — Progress Notes (Signed)
Occupational Therapy Session Note  Patient Details  Name: Alex Foster MRN: [id number] Date of Birth: 03/27/1946  Today's Date: 08/10/2012 Time: 4098-1191 Time Calculation (min): 40 min  Short Term Goals: Week 1:  OT Short Term Goal 1 (Week 1): Pt will transfer to toilet with mod A with +2 for safety OT Short Term Goal 2 (Week 1): Pt will perform 2/3 grooming tasks with mod A OT Short Term Goal 3 (Week 1): Pt will demonstrate sustained attention for 2 min with a functional tasks OT Short Term Goal 4 (Week 1): Pt will be oriented to self and place with max cuing (with environmental cues) OT Short Term Goal 5 (Week 1): Pt will be able to sort from 2 objects with mod cuing   Skilled Therapeutic Interventions/Progress Updates:    1:1 When arrived in pt's room NT bathing pt after large incontinent episode of BM. At first pt reluctant to roll and follow commands- pt confused with situation and uncomfortable. After simple explaination and gentle tactile cues pt able to follow simple one step commands with tactile cues and getting in line of sight. Pt with increased complaints of left shoulder pain- pt with 2 breath subluxation with discomfort in shoulder/scapular region - one repositioned pt with decreased discomfort. Focus on following one step commands, transfers, transitional movements, orientation (disoriented X4) attention and awareness and activity tolerance. Rolling side to side with total A +2 pt (10-20%); supine to sit with command and extra time max of one person. Able to sit EOB with bilateral feet on floor with steady A briefly before transfer. Pt able to perform sit to stand with mod to max of one person, but needed 2nd person to assist with transitional mobilbity to turn hips into chair. Pt required one deep suction via RN and multiple regular suction throughout session. Pt able to tolerate RA with moderate activity (with out PMSV) for 20- min with sats in low 90s. Pt with increased fatigue  by end of session.  Therapy Documentation Precautions:  Precautions Precautions: Fall Precaution Comments: L shoulder subluxation and L side hemiparesis. Peg tube, trach  Restrictions Weight Bearing Restrictions: No    Vital Signs: Therapy Vitals Pulse Rate: 67  Resp: 18  Patient Position, if appropriate: Sitting (pt at nurses station) Oxygen Therapy SpO2: 95 % O2 Device: Trach collar O2 Flow Rate (L/min): 6 L/min FiO2 (%): 35 % Pain: Pain Assessment Pain Assessment: No/denies pain Faces Pain Scale: Hurts whole lot Pain Type: Acute pain Pain Location: Arm Pain Orientation: Right Pain Intervention(s): RN made aware;Repositioned  See FIM for current functional status  Therapy/Group: Individual Therapy  [name] [facility] 08/10/2012, 3:23 PM

## 2012-08-10 NOTE — Progress Notes (Addendum)
Physical Therapy Session Note  Patient Details  Name: Alex Foster MRN: [id number] Date of Birth: [birth year]  Today's Date: 08/10/2012 Time:Session #1: 10:00-11:01, Session #2: [id number] Time Calculation (min): Session #1: 61 min, Session #2: 27 min  Short Term Goals: Week 1:  PT Short Term Goal 1 (Week 1): Pt will perform bed mobility with Mod A  PT Short Term Goal 2 (Week 1): Pt will perform bed<>chair transfer with Max A PT Short Term Goal 3 (Week 1): Pt will propell WC x 25' with Min A  PT Short Term Goal 4 (Week 1): Pt will demonstrate unsupported dynamic sitting balance with Min A for  Skilled Therapeutic Interventions/Progress Updates:    Session #1: This session focused on standing tolerance in [place], bed mobility and transfers.  We attempted WC mobility without success. Pt refused to try to wheel the WC, but once therapist started pushing he would help with right arm and left leg, just not enough to move the actual chair on his own power.  We stood in the [place] Plus 4 times with special care to attend to right arm to make sure the shoulder joint was not compromised.  Max stand two mins after brothers came in to distract him.  Pt with good initiation of stand, but poor posture once standing and then knees would fatigue quickly and he would sit back down without warning.  O2 sats remained in the 90s with TC and portable O2.  Copious productive coughs throughout the session.  PMV in place so pt could communicate and cuff was already deflated.  WC to bed total assist +2 (pt 60%) stand pivot.  Pt was motivated to get into the bed due to he needed to go to the bathroom.  Sit to supine mod assist of both legs pt using railing on bed to progress trunk down.  Left in room with RN who was attending to him.   Session #2: This session focused on bed mobility and transfers. Pt was reluctant to get out of bed despite encouragement from both of his brothers.  Supine to sit total assist +3, squat  pivot to WC total assist +3.  Pt decided not to assist with mobility at all this afternoon. After getting up to the [facility] he yet again had copious secretions come up that needed external suctioning.    Therapy Documentation Precautions:  Precautions Precautions: Fall Precaution Comments: L shoulder subluxation and L side hemiparesis. Peg tube, trach  Restrictions Weight Bearing Restrictions: No General:   Vital Signs: Therapy Vitals Pulse Rate: 67  Resp: 18  Patient Position, if appropriate: Sitting (pt at nurses station) Oxygen Therapy SpO2: 95 % O2 Device: Trach collar O2 Flow Rate (L/min): 6 L/min FiO2 (%): 35 % Pain: Pain Assessment Pain Assessment: No/denies pain Faces Pain Scale: Hurts whole lot Pain Type: Acute pain Pain Location: Arm Pain Orientation: Right Pain Intervention(s): RN made aware;Repositioned Mobility:   Locomotion : Wheelchair Mobility Distance: 100   See FIM for current functional status  Therapy/Group: Individual Therapy  Lurena Joiner B. Savon Cobbs, PT, DPT [phone]   08/10/2012, 3:10 PM

## 2012-08-10 NOTE — Progress Notes (Signed)
Speech Language Pathology Daily Session Note  Patient Details  Name: Alex Foster MRN: [id number] Date of Birth: 1946/06/01  Today's Date: 08/10/2012 Time: 4098-1191 Time Calculation (min): 53 min  Short Term Goals: Week 1: SLP Short Term Goal 1 (Week 1): Pt will verbalize 3-4 word utterances with efficient breath support with PMSV with Max A multimodal cueing SLP Short Term Goal 2 (Week 1): Pt will follow 1 step commnads with Max mutlimodal cueing with 25% of opportunites  SLP Short Term Goal 3 (Week 1): Pt will demonstrate sustained attention to a functional task for 1 minute with Max multimodal cueing SLP Short Term Goal 4 (Week 1): Pt will increase orentation to person, place and situtation with Max mutlimodal cueing  Skilled Therapeutic Interventions: Treatment focused on tolerance of PMSV, basic auditory comprehension and verbalization of y/n and basic wants and needs, attention and problem solving with wheelchair to toilet transfer. SLP placed PMSV in 1-5 minute intervals with adequate oxyegenation and redirection of air to upper airway. Pt with signficant secretions today leading to hard coughing with vavle in place requiring frequent removal. RN not available for deep suction though feel this would have aided pts tolerance of valve. Pt may continue to wear PMSV with SLP only. Today pt demonstrated behaviors consistent with a [place] Level V (confused and inappropriate). Pt consistently followed 1 step commands with mod verbal cues.  Pt correctly verbalized recent events x3. Sustained attention to verbal task for 30 seconds with min verbal cues. Pt expresses basic wants and needs. Pt demonstrating purposeful behaviors (verbalizing need for toileting, wiping nose when runny, etc). Pt only became restless when he needed to use the bathroom but was able to wait for assist calmly with frequent cues.    FIM:  Comprehension Comprehension Mode: Auditory Comprehension: 2-Understands basic 25 -  49% of the time/requires cueing 51 - 75% of the time Expression Expression: 2-Expresses basic 25 - 49% of the time/requires cueing 50 - 75% of the time. Uses single words/gestures. Social Interaction Social Interaction: 1-Interacts appropriately less than 25% of the time. May be withdrawn or combative. Problem Solving Problem Solving: 1-Solves basic less than 25% of the time - needs direction nearly all the time or does not effectively solve problems and may need a restraint for safety Memory Memory: 1-Recognizes or recalls less than 25% of the time/requires cueing greater than 75% of the time  Pain Pain Assessment Pain Assessment: No/denies pain  Therapy/Group: Individual Therapy  Jeremaih Klima, Riley Nearing 08/10/2012, 2:42 PM
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
## 2012-08-10 NOTE — Plan of Care (Signed)
Problem: RH BLADDER ELIMINATION Goal: RH STG MANAGE BLADDER WITH ASSISTANCE STG Manage Bladder With max Assistance  Outcome: Not Progressing Incont of bladder  Problem: RH COGNITION-NURSING Goal: RH STG ANTICIPATES NEEDS/CALLS FOR ASSIST W/ASSIST/CUES STG Anticipates Needs/Calls for Assist With max Assistance/Cues.  Outcome: Not Progressing Patient has not demonstrated the use the call light button  Problem: RH PAIN MANAGEMENT Goal: RH STG PAIN MANAGED AT OR BELOW PT'S PAIN GOAL 3 or less 1-10 scale  Outcome: Progressing No complaints of pain

## 2012-08-11 ENCOUNTER — Inpatient Hospital Stay (HOSPITAL_COMMUNITY): Payer: Medicaid Other | Admitting: Physical Therapy

## 2012-08-11 LAB — PROTIME-INR
INR: 1.83 — ABNORMAL HIGH (ref 0.00–1.49)
Prothrombin Time: 20.5 seconds — ABNORMAL HIGH (ref 11.6–15.2)

## 2012-08-11 LAB — GLUCOSE, CAPILLARY
Glucose-Capillary: 199 mg/dL — ABNORMAL HIGH (ref 70–99)
Glucose-Capillary: 225 mg/dL — ABNORMAL HIGH (ref 70–99)
Glucose-Capillary: 126 mg/dL — ABNORMAL HIGH (ref 70–99)

## 2012-08-11 MED ORDER — WARFARIN SODIUM 7.5 MG PO TABS
7.5000 mg | ORAL_TABLET | Freq: Once | ORAL | Status: AC
  Administered 2012-08-11: 7.5 mg via ORAL
  Filled 2012-08-11: qty 1

## 2012-08-11 NOTE — Progress Notes (Signed)
Physical Therapy Note  Patient Details  Name: Alex Foster MRN: [id number] Date of Birth: [birth year] Today's Date: 08/11/2012  1130- 1410 (40 minutes) individual Pain: Pt visually expresses pain RT hand when touched/premedicated Oxygen - 9 L trach collar Focus of treatment: sit to stand training/ gait with appropriate AD Pt states he is 66 years old Treatment: sit to stand x 2 to raised rail on steps mod/max assist (+ 2 for safety); gait 5 steps + 2 assist using EVA walker with patient stepping without assist; transfer wc > bed mod/max assist +1; sit to supine mod assist.   Tylicia Sherman,[name] 08/11/2012, 7:55 AM
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
## 2012-08-11 NOTE — Progress Notes (Signed)
ANTICOAGULATION CONSULT NOTE - Follow Up Consult  Pharmacy Consult:  Coumadin Indication: RUE DVT  Allergies  Allergen Reactions  . Penicillins     Patient Measurements:    Vital Signs: Temp: 97.5 F (36.4 C) (11/10 0445) Temp src: Oral (11/10 0445) BP: 166/71 mmHg (11/10 0445) Pulse Rate: 63  (11/10 0717)  Labs:  Basename 08/11/12 0713 08/10/12 0720 08/09/12 0505  HGB -- -- 10.2*  HCT -- -- 32.1*  PLT -- -- 161  APTT -- -- --  LABPROT 20.5* 19.1* 19.1*  INR 1.83* 1.66* 1.66*  HEPARINUNFRC -- -- --  CREATININE -- -- 0.82  CKTOTAL -- -- --  CKMB -- -- --  TROPONINI -- -- --    The CrCl is unknown because both a height and weight (above a minimum accepted value) are required for this calculation.    Marland Kitchen amantadine  100 mg Per Tube BID  . antiseptic oral rinse  15 mL Mouth Rinse QID  . bethanechol  25 mg Oral QID  . chlorhexidine  15 mL Mouth Rinse BID  . citalopram  10 mg Per Tube Daily  . clonazePAM  0.5 mg Oral BID  . feeding supplement (OSMOLITE 1.5 CAL)  237 mL Per Tube TID AC  . feeding supplement  30 mL Per Tube TID WC  . free water  220 mL Per Tube Q4H  . insulin aspart  0-15 Units Subcutaneous TID WC  . insulin aspart  0-5 Units Subcutaneous QHS  . insulin glargine  35 Units Subcutaneous Daily  . metFORMIN  500 mg Oral BID WC  . pantoprazole sodium  40 mg Per Tube Q1200  . potassium chloride  20 mEq Per Tube BID  . propranolol  30 mg Per Tube Q12H  . QUEtiapine  25 mg Oral QHS  . Tamsulosin HCl  0.8 mg Oral Daily  . [COMPLETED] warfarin  7.5 mg Oral ONCE-1800  . Warfarin - Pharmacist Dosing Inpatient   Does not apply q1800     Assessment: s/p TBI/multitrauma  Coumadin for RUE DVT. INR climbing back up to 1.83.  Goal of Therapy: INR 2-3  Plan: Coumadin 7.5mg  po x 1 again tonight.   Ane Conerly S. Merilynn Finland, PharmD, [facility] Clinical Staff Pharmacist Pager [phone]  08/11/2012, 9:04 AM

## 2012-08-12 ENCOUNTER — Encounter (HOSPITAL_COMMUNITY): Payer: Self-pay | Admitting: Occupational Therapy

## 2012-08-12 ENCOUNTER — Inpatient Hospital Stay (HOSPITAL_COMMUNITY): Payer: Medicaid Other | Admitting: Physical Therapy

## 2012-08-12 ENCOUNTER — Inpatient Hospital Stay (HOSPITAL_COMMUNITY): Payer: Self-pay | Admitting: Occupational Therapy

## 2012-08-12 ENCOUNTER — Inpatient Hospital Stay (HOSPITAL_COMMUNITY): Payer: Medicaid Other | Admitting: Speech Pathology

## 2012-08-12 ENCOUNTER — Inpatient Hospital Stay (HOSPITAL_COMMUNITY): Payer: Medicaid Other

## 2012-08-12 LAB — GLUCOSE, CAPILLARY
Glucose-Capillary: 137 mg/dL — ABNORMAL HIGH (ref 70–99)
Glucose-Capillary: 214 mg/dL — ABNORMAL HIGH (ref 70–99)
Glucose-Capillary: 88 mg/dL (ref 70–99)

## 2012-08-12 LAB — PROTIME-INR: INR: 2.03 — ABNORMAL HIGH (ref 0.00–1.49)

## 2012-08-12 MED ORDER — GUAIFENESIN 100 MG/5ML PO SYRP: 300.0000 mg | ORAL_SOLUTION | Freq: Four times a day (QID) | ORAL | Status: DC

## 2012-08-12 MED ORDER — DEXTROSE 50 % IV SOLN
INTRAVENOUS | Status: AC
  Administered 2012-08-12: 25 mL
  Filled 2012-08-12: qty 50

## 2012-08-12 MED ORDER — GUAIFENESIN 100 MG/5ML PO SOLN
300.0000 mg | Freq: Four times a day (QID) | ORAL | Status: DC
  Administered 2012-08-13 – 2012-09-03 (×77): 300 mg
  Filled 2012-08-12 (×29): qty 118
  Filled 2012-08-12: qty 15
  Filled 2012-08-12 (×12): qty 118
  Filled 2012-08-12: qty 15
  Filled 2012-08-12 (×7): qty 118
  Filled 2012-08-12: qty 15
  Filled 2012-08-12 (×14): qty 118
  Filled 2012-08-12: qty 15
  Filled 2012-08-12 (×28): qty 118

## 2012-08-12 MED ORDER — WARFARIN SODIUM 6 MG PO TABS
6.0000 mg | ORAL_TABLET | Freq: Every day | ORAL | Status: DC
  Administered 2012-08-12 – 2012-08-18 (×7): 6 mg
  Filled 2012-08-12 (×8): qty 1

## 2012-08-12 MED ORDER — INSULIN ASPART 100 UNIT/ML ~~LOC~~ SOLN
0.0000 [IU] | Freq: Every day | SUBCUTANEOUS | Status: DC
  Administered 2012-08-27: 2 [IU] via SUBCUTANEOUS

## 2012-08-12 MED ORDER — GLYCOPYRROLATE 1 MG PO TABS
1.0000 mg | ORAL_TABLET | Freq: Two times a day (BID) | ORAL | Status: DC
  Administered 2012-08-12 – 2012-09-03 (×45): 1 mg
  Filled 2012-08-12 (×53): qty 1

## 2012-08-12 MED ORDER — OSMOLITE 1.5 CAL PO LIQD
240.0000 mL | Freq: Every day | ORAL | Status: DC
  Administered 2012-08-12 – 2012-08-13 (×3): 240 mL
  Filled 2012-08-12 (×10): qty 474

## 2012-08-12 MED ORDER — GUAIFENESIN ER 600 MG PO TB12
600.0000 mg | ORAL_TABLET | Freq: Two times a day (BID) | ORAL | Status: DC
  Filled 2012-08-12 (×2): qty 1

## 2012-08-12 MED ORDER — OSMOLITE 1.5 CAL PO LIQD: 350.0000 mL | Freq: Every day | ORAL | Status: DC

## 2012-08-12 MED ORDER — FREE WATER
220.0000 mL | Freq: Every day | Status: DC
  Administered 2012-08-12 – 2012-08-13 (×3): 220 mL

## 2012-08-12 MED ORDER — INSULIN ASPART 100 UNIT/ML ~~LOC~~ SOLN
0.0000 [IU] | Freq: Three times a day (TID) | SUBCUTANEOUS | Status: DC
  Administered 2012-08-13: 2 [IU] via SUBCUTANEOUS
  Administered 2012-08-14 – 2012-08-15 (×3): 1 [IU] via SUBCUTANEOUS
  Administered 2012-08-15 – 2012-08-17 (×7): 2 [IU] via SUBCUTANEOUS
  Administered 2012-08-18: 1 [IU] via SUBCUTANEOUS
  Administered 2012-08-18: 2 [IU] via SUBCUTANEOUS
  Administered 2012-08-18: 3 [IU] via SUBCUTANEOUS
  Administered 2012-08-19 (×3): 2 [IU] via SUBCUTANEOUS
  Administered 2012-08-20: 3 [IU] via SUBCUTANEOUS
  Administered 2012-08-20: 2 [IU] via SUBCUTANEOUS
  Administered 2012-08-21: 1 [IU] via SUBCUTANEOUS
  Administered 2012-08-21: 5 [IU] via SUBCUTANEOUS
  Administered 2012-08-21: 1 [IU] via SUBCUTANEOUS
  Administered 2012-08-22 (×2): 2 [IU] via SUBCUTANEOUS
  Administered 2012-08-22 – 2012-08-23 (×2): 1 [IU] via SUBCUTANEOUS
  Administered 2012-08-23: 3 [IU] via SUBCUTANEOUS
  Administered 2012-08-23: 1 [IU] via SUBCUTANEOUS
  Administered 2012-08-24: 2 [IU] via SUBCUTANEOUS
  Administered 2012-08-24: 1 [IU] via SUBCUTANEOUS
  Administered 2012-08-24: 3 [IU] via SUBCUTANEOUS
  Administered 2012-08-25 (×3): 2 [IU] via SUBCUTANEOUS
  Administered 2012-08-26 – 2012-08-29 (×7): 1 [IU] via SUBCUTANEOUS
  Administered 2012-08-29 – 2012-08-30 (×2): 2 [IU] via SUBCUTANEOUS
  Administered 2012-08-30: 1 [IU] via SUBCUTANEOUS
  Administered 2012-08-30: 2 [IU] via SUBCUTANEOUS
  Administered 2012-08-31 (×2): 1 [IU] via SUBCUTANEOUS
  Administered 2012-09-01: 2 [IU] via SUBCUTANEOUS
  Administered 2012-09-02: 1 [IU] via SUBCUTANEOUS
  Administered 2012-09-03: 3 [IU] via SUBCUTANEOUS
  Administered 2012-09-04: 2 [IU] via SUBCUTANEOUS
  Administered 2012-09-04 – 2012-09-05 (×3): 1 [IU] via SUBCUTANEOUS
  Administered 2012-09-05 – 2012-09-06 (×2): 2 [IU] via SUBCUTANEOUS
  Administered 2012-09-06: 1 [IU] via SUBCUTANEOUS
  Administered 2012-09-07: 2 [IU] via SUBCUTANEOUS
  Administered 2012-09-07 – 2012-09-10 (×6): 1 [IU] via SUBCUTANEOUS
  Administered 2012-09-10: 2 [IU] via SUBCUTANEOUS

## 2012-08-12 MED ORDER — INSULIN GLARGINE 100 UNIT/ML ~~LOC~~ SOLN
30.0000 [IU] | Freq: Every day | SUBCUTANEOUS | Status: DC
  Administered 2012-08-13: 20 [IU] via SUBCUTANEOUS

## 2012-08-12 MED ORDER — DEXTROSE 50 % IV SOLN: 25.0000 mL | Freq: Once | INTRAVENOUS | Status: AC | PRN
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
## 2012-08-12 NOTE — Progress Notes (Signed)
ANTICOAGULATION CONSULT NOTE - Follow Up Consult  Pharmacy Consult:  Coumadin Indication: RUE DVT  Allergies  Allergen Reactions  . Penicillins     Patient Measurements:    Vital Signs: Temp: 97.2 F (36.2 C) (11/11 0546) Temp src: Oral (11/11 0546) BP: 145/70 mmHg (11/11 0546) Pulse Rate: 64  (11/11 0546)  Labs:  Basename 08/12/12 0630 08/11/12 0713 08/10/12 0720  HGB -- -- --  HCT -- -- --  PLT -- -- --  APTT -- -- --  LABPROT 22.1* 20.5* 19.1*  INR 2.03* 1.83* 1.66*  HEPARINUNFRC -- -- --  CREATININE -- -- --  CKTOTAL -- -- --  CKMB -- -- --  TROPONINI -- -- --    The CrCl is unknown because both a height and weight (above a minimum accepted value) are required for this calculation.    Marland Kitchen amantadine  100 mg Per Tube BID  . antiseptic oral rinse  15 mL Mouth Rinse QID  . bethanechol  25 mg Oral QID  . chlorhexidine  15 mL Mouth Rinse BID  . citalopram  10 mg Per Tube Daily  . clonazePAM  0.5 mg Oral BID  . feeding supplement (OSMOLITE 1.5 CAL)  237 mL Per Tube TID AC  . feeding supplement  30 mL Per Tube TID WC  . free water  220 mL Per Tube Q4H  . insulin aspart  0-15 Units Subcutaneous TID WC  . insulin aspart  0-5 Units Subcutaneous QHS  . insulin glargine  35 Units Subcutaneous Daily  . metFORMIN  500 mg Oral BID WC  . pantoprazole sodium  40 mg Per Tube Q1200  . potassium chloride  20 mEq Per Tube BID  . propranolol  30 mg Per Tube Q12H  . QUEtiapine  25 mg Oral QHS  . Tamsulosin HCl  0.8 mg Oral Daily  . [COMPLETED] warfarin  7.5 mg Oral ONCE-1800  . Warfarin - Pharmacist Dosing Inpatient   Does not apply q1800     Assessment: s/p TBI/multitrauma  Coumadin for RUE DVT. INR therapeutic today, became supratherapeutic on 7.5mg  prior. Will try lower daily dose.  Goal of Therapy: INR 2-3  Plan: Coumadin 6mg  daily and change protime to MWF.  Verlene Mayer, PharmD, BCPS Pager [phone] 08/12/2012, 9:11 AM

## 2012-08-12 NOTE — Progress Notes (Signed)
Orthopedic Tech Progress Note Patient Details:  Alex Foster 02-26-1946 [id number]  Casting Type of Cast: Short arm cast Cast Location: (R) UE Cast Material: Fiberglass Cast Intervention: Application     [name] 08/12/2012, 7:02 PM

## 2012-08-12 NOTE — Progress Notes (Signed)
Physical Therapy Note  Patient Details  Name: Alex Foster MRN: [id number] Date of Birth: [birth year] Today's Date: 08/12/2012  Time: 1000-1040 40 minutes  Pt c/o R wrist pain with wt bearing, eased with rest.  Sit to stand and SPT training with +2 assist for safety initially, able to perform with max A by end of session.  Pt performed transfers with RW with assist for L UE to grip RW.  Pt requires cues for wt shifts, able to take small steps independently.  Short distance gait with +2 assist for safety, pt able to take small steps bilaterally, fatigues easily.  Pt able to follow 1 step commands with supervision cuing and attend to functional tasks with supervision cues.  Bathroom mobility and transfers with +2 assist for safety and sequencing transfer and toileting task. Pt continent, able to express need to go to the bathroom.  Individual therapy   Thatcher Doberstein 08/12/2012, 11:49 AM

## 2012-08-12 NOTE — Progress Notes (Signed)
Orthopedic Tech Progress Note Patient Details:  Alex Foster 06-Jun-1946 Replacement binder soiled. Ortho Devices Type of Ortho Device: Abdominal binder Ortho Device/Splint Interventions: Ordered   [name] 08/12/2012, 10:34 PM

## 2012-08-12 NOTE — Progress Notes (Signed)
Speech Language Pathology Daily Session Note  Patient Details  Name: Alex Foster MRN: [id number] Date of Birth: 1945/12/31  Today's Date: 08/12/2012 Time: 0900-0955 Time Calculation (min): 55 min  Short Term Goals: Week 1: SLP Short Term Goal 1 (Week 1): Pt will verbalize 3-4 word utterances with efficient breath support with PMSV with Max A multimodal cueing SLP Short Term Goal 2 (Week 1): Pt will follow 1 step commnads with Max mutlimodal cueing with 25% of opportunites  SLP Short Term Goal 3 (Week 1): Pt will demonstrate sustained attention to a functional task for 1 minute with Max multimodal cueing SLP Short Term Goal 4 (Week 1): Pt will increase orentation to person, place and situtation with Max mutlimodal cueing  Skilled Therapeutic Interventions: Treatment focused on tolerance of PMSV, basic auditory comprehension and verbalization of y/n and basic wants and needs and attention. SLP placed PMSV in 1-5 minute intervals with adequate oxyegenation and redirection of air to upper airway. Pt with significant secretions today leading to hard coughing with vavle in place requiring frequent removal. RN provided deep suction although it was not beneficial in decreasing excessive coughing. Pt may continue to wear PMSV with SLP only. Today pt demonstrated behaviors consistent with a [place]o Level V (confused and inappropriate) and consistently followed 1 step commands with mod verbal cues. Pt was oriented x3 and sustained attention to verbal task for 30 seconds with min verbal cues. Pt expresses basic wants and needs and demonstrated purposeful behaviors by verbalizing wanting to go to bed.     FIM:  Comprehension Comprehension Mode: Auditory Comprehension: 3-Understands basic 50 - 74% of the time/requires cueing 25 - 50%  of the time Expression Expression Assistive Devices: 6-Talk trach valve Expression: 2-Expresses basic 25 - 49% of the time/requires cueing 50 - 75% of the time. Uses single  words/gestures. Social Interaction Social Interaction: 2-Interacts appropriately 25 - 49% of time - Needs frequent redirection. Problem Solving Problem Solving: 1-Solves basic less than 25% of the time - needs direction nearly all the time or does not effectively solve problems and may need a restraint for safety Memory Memory: 1-Recognizes or recalls less than 25% of the time/requires cueing greater than 75% of the time  Pain Pain Assessment Pain Assessment: Faces Faces Pain Scale: Hurts little more Pain Type: Acute pain Pain Location: Wrist Pain Orientation: Right Pain Intervention(s): Repositioned;Ambulation/increased activity (RN gave pain meds during session)  Therapy/Group: Individual Therapy  Draven Natter 08/12/2012, 3:59 PM
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
## 2012-08-12 NOTE — Progress Notes (Signed)
Physical Therapy Note  Patient Details  Name: Johsua Schlieper MRN: [id number] Date of Birth: 04-11-1946 Today's Date: 08/12/2012  Time: 1400-1445 45 minutes  No c/o pain.  Pt agitated upon PT arrival, attempting to get out of bed.  Pt assisted out of bed with mod A supine to sit and mod A for seated balance edge of bed.  Pt max A stand pivot transfer to w/c.  Pt expressed need to toilet.  Assisted with toilet transfers and toileting +2 assist with pt initiating steps and attempting to assist with hygiene and clothing management appropriately.  Gait with +2 3 musketeers style with assist for wt shifting and advancing L LE.  Pt tends to keep L LE extended while walking.  Pt encouraged to change shirt due to his being dirty.  Pt initiates doffing/donning shirt then became easily frustrated when unable to complete task.  Pt asked for and accepted assistance.  ? L inattention vs motor planning issues for difficulty with L UE during clothing management.  Individual therapy   Carl Butner 08/12/2012, 4:23 PM

## 2012-08-12 NOTE — Progress Notes (Signed)
Physical Therapy Session Note  Patient Details  Name: Alex Foster MRN: [id number] Date of Birth: 09-Dec-1945  Today's Date: 08/12/2012 Time: 1300-1340 Time Calculation (min): 40 min  Short Term Goals: Week 1:  PT Short Term Goal 1 (Week 1): Pt will perform bed mobility with Mod A  PT Short Term Goal 2 (Week 1): Pt will perform bed<>chair transfer with Max A PT Short Term Goal 3 (Week 1): Pt will propell WC x 25' with Min A  PT Short Term Goal 4 (Week 1): Pt will demonstrate unsupported dynamic sitting balance with Min A for  Skilled Therapeutic Interventions/Progress Updates:    This session focused on transfers and bed mobility.  WC to bed max assist with pt pushing up from the [facility].  Some irritability and combativeness with PT arguing about task at hand and not wanting his safety mittens or safety belt on at the time.  Sit to stand and stand pivot towards his right side max assist with help to both lift trunk and lowe trunk to bed.  1-2 small pivotal steps taken to turn towards the bed max assist to maintain balance and keep knees from buckling .  Sit to supine supervision for safety.  Moving up in the bed (pt was having a hard time processing my instructions) total assist +2.   I tried to limit use of right arm throughout session to try to protect it due to + distal forearm fracture and ortho consult is pending.    Therapy Documentation Precautions:  Precautions Precautions: Fall Precaution Comments: L shoulder subluxation and L side hemiparesis. Peg tube, trach , right wrist fracture awaiting ortho consult 08/12/12 Restrictions Weight Bearing Restrictions: No General:   Vital Signs: Oxygen Therapy SpO2: 96 % O2 Device: Trach collar O2 Flow Rate (L/min): 5 L/min FiO2 (%): 28 % Pain: Pain Assessment Pain Assessment: Faces Faces Pain Scale: Hurts little more Pain Type: Acute pain Pain Location: Wrist Pain Orientation: Right Pain Intervention(s):  Repositioned;Ambulation/increased activity (RN gave pain meds during session)   Locomotion : Wheelchair Mobility Distance: 50   See FIM for current functional status  Therapy/Group: Individual Therapy  Lurena Joiner B. Cheree Fowles, PT, DPT [phone]   08/12/2012, 2:44 PM

## 2012-08-12 NOTE — Progress Notes (Signed)
[facility] Individual Statement of Services  Patient Name:  Alex Foster  Date:  08/12/2012  Welcome to the [facility].  Our goal is to provide you with an individualized program based on your diagnosis and situation, designed to meet your specific needs.  With this comprehensive rehabilitation program, you will be expected to participate in at least 3 hours of rehabilitation therapies Monday-Friday, with modified therapy programming on the weekends.  Your rehabilitation program will include the following services:  Physical Therapy (PT), Occupational Therapy (OT), Speech Therapy (ST), 24 hour per day rehabilitation nursing, Therapeutic Recreaction (TR), Neuropsychology, Case Management ( Social Worker), Rehabilitation Medicine, Nutrition Services and Pharmacy Services  Weekly team conferences will be held on Tuesdays to discuss your progress.  Your  Social Worker will talk with you frequently to get your input and to update you on team discussions.  Team conferences with you and your family in attendance may also be held.  Expected length of stay: 3-4 weeks  Overall anticipated outcome: moderate assistance  Depending on your progress and recovery, your program may change.  Your  Social Worker will coordinate services and will keep you informed of any changes.  Your  Social Worker's name and contact numbers are listed  below.  The following services may also be recommended but are not provided by the [facility]:   Driving Evaluations  Home Health Rehabiltiation Services  Outpatient Rehabilitatation [facility]  Vocational Rehabilitation   Arrangements will be made to provide these services after discharge if needed.  Arrangements include referral to agencies that provide these services.  Your insurance has been verified to be:  Medicaid [employer] primary doctor is:  Dr. Fara Boros  Pertinent information will be shared  with your doctor and your insurance company.  Social Worker:  [place], [place] [phone] or ([phone]  Information discussed with and copy given to patient by: Amada Jupiter, 08/12/2012, 10:15 AM
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
## 2012-08-12 NOTE — Progress Notes (Signed)
Occupational Therapy Session Note  Patient Details  Name: Mayco Walrond MRN: [id number] Date of Birth: 09/17/1946  Today's Date: 08/12/2012 Time: 1478-2956 Time Calculation (min): 30 min  Short Term Goals: Week 1:  OT Short Term Goal 1 (Week 1): Pt will transfer to toilet with mod A with +2 for safety OT Short Term Goal 2 (Week 1): Pt will perform 2/3 grooming tasks with mod A OT Short Term Goal 3 (Week 1): Pt will demonstrate sustained attention for 2 min with a functional tasks OT Short Term Goal 4 (Week 1): Pt will be oriented to self and place with max cuing (with environmental cues) OT Short Term Goal 5 (Week 1): Pt will be able to sort from 2 objects with mod cuing   Skilled Therapeutic Interventions/Progress Updates:    1:1 focus on following one step commands, short distance functional ambulation with 3 musketeer style, attention, verbalizing wants and needs, focused to sustained attention. Pt with swollen right wrist with appearance of it being displaced- RN and PA pt with multiple complaints about it. Pt able to take steps with assistance for weight shifting, pt with decreased left knee flexion (maintaining knee extension with stepping). Pt with complaints of fatigue.  Therapy Documentation Precautions:  Precautions Precautions: Fall Precaution Comments: L shoulder subluxation and L side hemiparesis. Peg tube, trach  Restrictions Weight Bearing Restrictions: No General:   Vital Signs: Oxygen Therapy SpO2: 100% O2 Device: Trach collar O2 Flow Rate (L/min): 5 L/min FiO2 (%): 28 % Pain: Pain Assessment Pain Assessment: 0-10 Pain Score: 10-Worst pain ever Pain Type: Acute pain Pain Location: Wrist Pain Orientation: Right Pain Descriptors: Aching Pain Frequency: Occasional Patients Stated Pain Goal: 3 Pain Intervention(s): Medication (See eMAR)  See FIM for current functional status  Therapy/Group: Individual Therapy  [name] [facility] 08/12/2012, 11:54  AM

## 2012-08-12 NOTE — Progress Notes (Signed)
Orthopedic Tech Progress Note Patient Details:  Alex Foster 1946-07-26 [id number] This is replacement binder. Ortho Devices Type of Ortho Device: Abdominal binder Ortho Device/Splint Interventions: Ordered   [name] 08/12/2012, 9:14 PM

## 2012-08-12 NOTE — Progress Notes (Signed)
X-rays of right wrist completed today secondary to complaints of pain. X-ray results reveal mildly displaced distal right ulnar fracture. Orthopedic services Dr. Sherlean Foot at [phone] contacted. Await followup orthopedic services on further recommendations

## 2012-08-13 ENCOUNTER — Encounter (HOSPITAL_COMMUNITY): Payer: Self-pay | Admitting: Occupational Therapy

## 2012-08-13 ENCOUNTER — Inpatient Hospital Stay (HOSPITAL_COMMUNITY): Payer: Medicaid Other | Admitting: Speech Pathology

## 2012-08-13 ENCOUNTER — Inpatient Hospital Stay (HOSPITAL_COMMUNITY): Payer: Self-pay | Admitting: Occupational Therapy

## 2012-08-13 ENCOUNTER — Inpatient Hospital Stay (HOSPITAL_COMMUNITY): Payer: Medicaid Other | Admitting: Physical Therapy

## 2012-08-13 DIAGNOSIS — S069X9A Unspecified intracranial injury with loss of consciousness of unspecified duration, initial encounter: Secondary | ICD-10-CM

## 2012-08-13 LAB — GLUCOSE, CAPILLARY
Glucose-Capillary: 85 mg/dL (ref 70–99)
Glucose-Capillary: 57 mg/dL — ABNORMAL LOW (ref 70–99)
Glucose-Capillary: 108 mg/dL — ABNORMAL HIGH (ref 70–99)
Glucose-Capillary: 158 mg/dL — ABNORMAL HIGH (ref 70–99)
Glucose-Capillary: 93 mg/dL (ref 70–99)

## 2012-08-13 MED ORDER — VITAL AF 1.2 CAL PO LIQD
237.0000 mL | Freq: Four times a day (QID) | ORAL | Status: DC
  Filled 2012-08-13 (×4): qty 237

## 2012-08-13 MED ORDER — FREE WATER
220.0000 mL | Freq: Every day | Status: DC
  Administered 2012-08-13 – 2012-08-19 (×38): 220 mL

## 2012-08-13 MED ORDER — CLONAZEPAM 0.5 MG PO TABS
0.5000 mg | ORAL_TABLET | Freq: Every day | ORAL | Status: DC
  Administered 2012-08-14 – 2012-09-01 (×19): 0.5 mg via ORAL
  Filled 2012-08-13 (×18): qty 1

## 2012-08-13 MED ORDER — VITAL 1.5 CAL PO LIQD
240.0000 mL | ORAL | Status: DC
  Administered 2012-08-13 – 2012-08-16 (×16): 240 mL
  Filled 2012-08-13 (×26): qty 1000

## 2012-08-13 MED ORDER — VITAL 1.5 CAL PO LIQD
240.0000 mL | Freq: Every day | ORAL | Status: DC
  Filled 2012-08-13 (×7): qty 1000

## 2012-08-13 MED ORDER — VITAL 1.5 CAL PO LIQD
1000.0000 mL | Freq: Every day | ORAL | Status: DC
  Filled 2012-08-13 (×8): qty 1000

## 2012-08-13 MED ORDER — OSMOLITE 1.5 CAL PO LIQD
310.0000 mL | Freq: Every day | ORAL | Status: DC
  Filled 2012-08-13 (×6): qty 474

## 2012-08-13 MED ORDER — VITAL 1.5 CAL PO LIQD
240.0000 mL | Freq: Four times a day (QID) | ORAL | Status: DC
  Filled 2012-08-13 (×4): qty 1000

## 2012-08-13 MED ORDER — INSULIN GLARGINE 100 UNIT/ML ~~LOC~~ SOLN: 20.0000 [IU] | Freq: Every day | SUBCUTANEOUS | Status: DC

## 2012-08-13 MED ORDER — TAMSULOSIN HCL 0.4 MG PO CAPS
0.8000 mg | ORAL_CAPSULE | Freq: Every day | ORAL | Status: DC
  Administered 2012-08-13 – 2012-09-10 (×29): 0.8 mg via ORAL
  Filled 2012-08-13 (×30): qty 2

## 2012-08-13 MED ORDER — VITAL 1.5 CAL PO LIQD: 1000.0000 mL | ORAL | Status: DC

## 2012-08-13 MED ORDER — VITAL 1.5 CAL PO LIQD
1000.0000 mL | Freq: Every day | ORAL | Status: DC
  Filled 2012-08-13 (×7): qty 1000

## 2012-08-13 MED ORDER — INSULIN GLARGINE 100 UNIT/ML ~~LOC~~ SOLN: 25.0000 [IU] | Freq: Every day | SUBCUTANEOUS | Status: DC

## 2012-08-13 NOTE — Progress Notes (Signed)
Occupational Therapy Session Note  Patient Details  Name: Alex Foster MRN: [id number] Date of Birth: May 02, 1946  Today's Date: 08/13/2012 Time: 0730-0820 Time Calculation (min): 50 min  Short Term Goals: Week 1:  OT Short Term Goal 1 (Week 1): Pt will transfer to toilet with mod A with +2 for safety OT Short Term Goal 2 (Week 1): Pt will perform 2/3 grooming tasks with mod A OT Short Term Goal 3 (Week 1): Pt will demonstrate sustained attention for 2 min with a functional tasks OT Short Term Goal 4 (Week 1): Pt will be oriented to self and place with max cuing (with environmental cues) OT Short Term Goal 5 (Week 1): Pt will be able to sort from 2 objects with mod cuing   Skilled Therapeutic Interventions/Progress Updates:    1:1 Pt very sleepy this am - wanting to remain laying down. Encourage to get up and go to bathroom but declined despite multiple attempts. Supine to sit at EOB with max A and maintaining sitting balance with mod A. Pt became incontinent of urine in brief - pt still wanting to lay down- laid down and performing rolling side to side for hygiene, to change sheets and clothing. Pt falling asleep throughout session. Pt disoriented to place, time or situation. Focus on session remained around attention, self awareness, following one step commands. Pt was non-aggressive in session and was able to make his needs known.  Therapy Documentation Precautions:  Precautions Precautions: Fall Precaution Comments: L shoulder subluxation and L side hemiparesis. Peg tube, trach , right wrist fracture awaiting ortho consult 08/12/12 Restrictions Weight Bearing Restrictions: No General: General Amount of Missed OT Time (min): 10 Minutes Foster Signs: Therapy Vitals Temp: 98 F (36.7 C) Temp src: Oral Pulse Rate: 56  Resp: 20  BP: 145/80 mmHg Patient Position, if appropriate: Lying Oxygen Therapy SpO2: 96 % O2 Device: Trach collar Pain:  no c/o pain  See FIM for current  functional status  Therapy/Group: Individual Therapy  [name] [facility] 08/13/2012, 8:30 AM
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
## 2012-08-13 NOTE — Progress Notes (Signed)
Hypoglycemic Event  CBG: 57  Treatment: Tube feeding bolus 240 ml  Symptoms: None  Follow-up CBG: Time: 0650 CBG Result: 120  Possible Reasons for Event: Unknown Comments/MD notified: Tolerated tube feeding well    Alex Foster  Remember to initiate Hypoglycemia Order Set & complete

## 2012-08-13 NOTE — Progress Notes (Signed)
Nutrition Follow-up  Intervention:   1. Will change TF to elemental formula to help with GI tolerance. Recommend bolus regimen of Vital 1.5, 1 can, 6 times daily. Continue 30 ml Prostat via tube TID. Increase free water flushes to 220 ml six times daily. This regimen provides: 2430 kcal, 141 grams protein, 2406 ml water. 2. RD to continue to follow nutrition care plan  Assessment:   Pt with hypoglycemic episode this morning, PA Delle Reining changed TF regimen to Osmolite 1.5 5 cans daily. This regimen only provides 89% kcal needs and 75% protein needs. Discussed with PA Dan Anguilli need for increase in formula. RN reports that pt has liquid stools after each tube feeding. Will change to elemental formula.  Diet Order:  NPO TF: Osmolite 1.5 1 can five times, 30 ml Prostat TID, and free water 220 ml five times daily provides 2075 kcal, 120 grams protein, 2005 ml free water (provides 89% kcal needs and 75% protein needs)  Meds: Scheduled Meds:   . amantadine  100 mg Per Tube BID  . antiseptic oral rinse  15 mL Mouth Rinse QID  . bethanechol  25 mg Oral QID  . chlorhexidine  15 mL Mouth Rinse BID  . citalopram  10 mg Per Tube Daily  . clonazePAM  0.5 mg Oral QHS  . [COMPLETED] dextrose      . feeding supplement (OSMOLITE 1.5 CAL)  310 mL Per Tube 5 X Daily  . feeding supplement  30 mL Per Tube TID WC  . free water  220 mL Per Tube 5 X Daily  . glycopyrrolate  1 mg Per Tube BID  . guaifenesin  300 mg Per Tube Q6H  . insulin aspart  0-5 Units Subcutaneous QHS  . insulin aspart  0-9 Units Subcutaneous TID WC  . insulin glargine  20 Units Subcutaneous Daily  . metFORMIN  500 mg Oral BID WC  . pantoprazole sodium  40 mg Per Tube Q1200  . propranolol  30 mg Per Tube Q12H  . QUEtiapine  25 mg Oral QHS  . Tamsulosin HCl  0.8 mg Oral Daily  . warfarin  6 mg Per Tube q1800  . Warfarin - Pharmacist Dosing Inpatient   Does not apply q1800  . [DISCONTINUED] clonazePAM  0.5 mg Oral BID  .  [DISCONTINUED] feeding supplement (OSMOLITE 1.5 CAL)  237 mL Per Tube TID AC  . [DISCONTINUED] feeding supplement (OSMOLITE 1.5 CAL)  240 mL Per Tube 5 X Daily  . [DISCONTINUED] feeding supplement (OSMOLITE 1.5 CAL)  350 mL Per Tube 5 X Daily  . [DISCONTINUED] free water  220 mL Per Tube Q4H  . [DISCONTINUED] guaiFENesin  600 mg Oral BID  . [DISCONTINUED] guaifenesin  300 mg Per Tube Q6H  . [DISCONTINUED] insulin aspart  0-15 Units Subcutaneous TID WC  . [DISCONTINUED] insulin aspart  0-5 Units Subcutaneous QHS  . [DISCONTINUED] insulin glargine  25 Units Subcutaneous Daily  . [DISCONTINUED] insulin glargine  30 Units Subcutaneous Daily  . [DISCONTINUED] insulin glargine  35 Units Subcutaneous Daily  . [DISCONTINUED] potassium chloride  20 mEq Per Tube BID   Continuous Infusions:   . [DISCONTINUED] feeding supplement (OSMOLITE 1.5 CAL) 1,000 mL (08/10/12 2039)   PRN Meds:.acetaminophen (TYLENOL) oral liquid 160 mg/5 mL, acetaminophen, albuterol, bisacodyl, [COMPLETED] dextrose, guaifenesin, ipratropium, LORazepam, ondansetron (ZOFRAN) IV, ondansetron, oxyCODONE, senna-docusate, sorbitol, traZODone   CMP     Component Value Date/Time   NA 136 08/09/2012 0505   K 4.3 08/09/2012 0505  CL 100 08/09/2012 0505   CO2 31 08/09/2012 0505   GLUCOSE 107* 08/09/2012 0505   BUN 25* 08/09/2012 0505   CREATININE 0.82 08/09/2012 0505   CREATININE 1.15 03/13/2012 1043   CALCIUM 8.9 08/09/2012 0505   PROT 6.8 08/09/2012 0505   ALBUMIN 2.7* 08/09/2012 0505   AST 18 08/09/2012 0505   ALT 10 08/09/2012 0505   ALKPHOS 151* 08/09/2012 0505   BILITOT 0.5 08/09/2012 0505   GFRNONAA >90 08/09/2012 0505   GFRAA >90 08/09/2012 0505    CBG (last 3)   Basename 08/13/12 0709 08/13/12 0650 08/13/12 0614  GLUCAP 108* 120* 57*     Intake/Output Summary (Last 24 hours) at 08/13/12 1156 Last data filed at 08/12/12 2100  Gross per 24 hour  Intake    480 ml  Output      1 ml  Net    479 ml  BM 11/11  Weight  Status:  207 lb - no new weight  Estimated needs:  2340 - 2630 kcal, 160 - 180 grams protein  Nutrition Dx:  Inadequate oral intake r/t inability to eat AEB NPO status and need for PEG for nutrition therapy  Goal:  Pt to meet >/= 90% of their estimated nutrition needs   Monitor:  weight trends, lab trends, I/O's, TF tolerance  Jarold Motto MS, RD, LDN Pager: [phone] After-hours pager: [phone]

## 2012-08-13 NOTE — Patient Care Conference (Signed)
Inpatient RehabilitationTeam Conference Note Date: 08/13/2012   Time: 3:25 PM    Patient Name: Alex Foster      Medical Record Number: [id number]  Date of Birth: Mar 21, 1946 Sex: Male         Room/Bed: 4002/4002-01 Payor Info: Payor: MEDICAID [place]  Plan: MEDICAID [place] ACCESS  Product Type: *No Product type*     Admitting Diagnosis: TRAUMA TBI AND CVA  Admit Date/Time:  08/08/2012  3:19 PM Admission Comments: No comment available   Primary Diagnosis:  Trauma Principal Problem: Trauma  Patient Active Problem List   Diagnosis Date Noted  . Trauma 08/09/2012  . DM (diabetes mellitus) 08/03/2012  . DVT of axillary vein, acute right 08/03/2012  . Anticoagulation goal of INR 2 to 3 08/03/2012  . Hypertension   . Myocardial infarction   . Pleural effusion 06/09/2012  . Mobitz (type) II atrioventricular block 06/04/2012  . Acute respiratory failure with hypoxia 06/02/2012  . Cardiac arrest 06/02/2012  . Cardiogenic shock 06/02/2012  . Tracheobronchitis, MRSA 06/02/2012  . Leukocytosis 06/01/2012  . Lactic acidosis 06/01/2012  . Multiple rib fractures 05/31/2012  . 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 Extensive facial fractures 05/31/2012  . Leg erythema 05/11/2012  . Health care maintenance 02/29/2012  . Dizziness 07/18/2011  . Hepatic encephalopathy 04/12/2011  . Low back pain 03/29/2011  . Physical deconditioning 03/28/2011  . Thrombocytopenia 03/24/2011  . Anasarca 03/24/2011  . Knee pain, left 02/21/2011  . Constipation 11/22/2010  . OTHER DISORDERS OF BONE AND CARTILAGE OTHER 08/17/2010  . RBBB 02/03/2010  . INSOMNIA, CHRONIC 12/16/2009  . BENIGN POSITIONAL VERTIGO 08/10/2008  . HYPERLIPIDEMIA, CONTROLLED 12/04/2007  . DIABETES MELLITUS, TYPE II, WITHOUT COMPLICATIONS 08/23/2007  . TOBACCO ABUSE 05/08/2007  . HYPERTENSION, BENIGN ESSENTIAL, CONTROLLED 05/08/2007  . COPD 05/08/2007    Expected Discharge Date: Expected Discharge Date: 09/05/12 (d/c to  SNF)  Team Members Present: Physician: Dr. Claudette Laws Nurse Present: Carmie End, RN PT Present: Reggy Eye, PT OT Present: Mackie Pai, Marye Round, OT;Ardis Rowan, COTA SLP Present: Feliberto Gottron, SLP     Current Status/Progress Goal Weekly Team Focus  Medical   Trach, G tube, needs restraints, secretions via trach  increase passy muir valve  trial robinul,    Bowel/Bladder   continent duringthe day with toileting every 2 hours continent of bowel liquid/watery at times  incontinent at hs wears condom cath   mod independent   up to bedside commode daytime and toilet every 3 hours at hs    Swallow/Nutrition/ Hydration   NPO  Min A with least restrictive diet  PO trials   ADL's   +2 for safety for transfers (pt 50%), mod A for sitting balance unsupported, total A +2 for dressing and toileting  overall mod A, min A transfers  activity tolerance, orientation, attention, awareness, sitting balance, and transfers   Mobility   mod A  min-mod A  activity tolerance, gait training   Communication   Max A with PMSV  Min A  tolerance of PMSV, expression of wants/needs   Safety/Cognition/ Behavioral Observations  Total A  Min A  attention, awareness    Pain   right arm in cast  oxycodone 10mg  solution   less than 3  on 0-10 scale   monitor effectiveness of pain med    Skin   scab right knee abdomen bruisiing   no new breakdown   turn every 2 hours    Rehab Goals Patient on target to meet rehab  goals: Yes *See Interdisciplinary Assessment and Plan and progress notes for long and short-term goals  Barriers to Discharge: SNF will not take pt with Trach, restraints    Possible Resolutions to Barriers:  ? decannulating    Discharge Planning/Teaching Needs:  goals to decrease level of assist needed and advance behaviorally to not needing restraints - placement at SNF in [place] is ultimate goals      Team Discussion:  Very impaired gentleman, yet team is hopeful we  can make some nice gains and aim for minimal assistance goals. Much discussion about trach, trial of PMV and changing in meds to aid in decreased thickness of secretions.  Has periods of being able to indicate when he needs to go to the bathroom.  SW to clarify level pt needs to be to d/c to SNFs that had been interested in him PTA.  Revisions to Treatment Plan:  None   Continued Need for Acute Rehabilitation Level of Care: The patient requires daily medical management by a physician with specialized training in physical medicine and rehabilitation for the following conditions: Daily direction of a multidisciplinary physical rehabilitation program to ensure safe treatment while eliciting the highest outcome that is of practical value to the patient.: Yes Daily medical management of patient stability for increased activity during participation in an intensive rehabilitation regime.: Yes Daily analysis of laboratory values and/or radiology reports with any subsequent need for medication adjustment of medical intervention for : Neurological problems;Post surgical problems;Pulmonary problems  [name] 08/13/2012, 6:14 PM

## 2012-08-13 NOTE — Progress Notes (Signed)
Physical Therapy Note  Patient Details  Name: Alex Foster MRN: [id number] Date of Birth: 10/17/1945 Today's Date: 08/13/2012  Time: 900-949 49 minutes  Pt c/o R wrist pain with movement, eases with rest.  Pt required max encouragement to participate in PT session.  Pt states he is "tired" and does not want to get up.  Pt agreeable to sit up in recliner chair.  Pt requires mod A for supine to sit with cues to use L UE to assist in raising trunk.  Pt min A for static sitting balance edge of bed, loses balance posteriorly and to the left.  Pt SPT with mod A to recliner chair, improved foot clearance and stepping today.  Treatment for attention and awareness.  Pt able to express that he does not feel well but has difficulty explaining exact deficits.  Pt continues to require cues for sustained attention to task throughout treatment.  Pt with much improved voice and vocal quality, able to engage in conversation with short sentences, still not initiating conversation during this session.  Individual therapy   Hazelgrace Bonham 08/13/2012, 9:50 AM
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
## 2012-08-13 NOTE — Progress Notes (Signed)
Speech Language Pathology Daily Session Notes  Patient Details  Name: Alex Foster MRN: [id number] Date of Birth: Jan 06, 1946  Today's Date: 08/13/2012  Session 1 Time: 1000-1100 Time Calculation: 60 mins  Session 2 Time: 1300-1340 Time Calculation (min): 40 min  Short Term Goals: Week 1: SLP Short Term Goal 1 (Week 1): Pt will verbalize 3-4 word utterances with efficient breath support with PMSV with Max A multimodal cueing SLP Short Term Goal 2 (Week 1): Pt will follow 1 step commnads with Max mutlimodal cueing with 25% of opportunites  SLP Short Term Goal 3 (Week 1): Pt will demonstrate sustained attention to a functional task for 1 minute with Max multimodal cueing SLP Short Term Goal 4 (Week 1): Pt will increase orentation to person, place and situtation with Max mutlimodal cueing  Skilled Therapeutic Interventions: Treatment focused on tolerance of PMSV, basic auditory comprehension, verbalization of y/n and basic wants and needs and attention. SLP placed PMSV in 1-5 minute intervals with adequate oxygenation and redirection of air to upper airway. Pt with significant secretions today leading to excessive hard coughing with valve in place requiring frequent removal. Pt may continue to wear PMSV with SLP only. Today pt demonstrated behaviors consistent with a Rancho Level V (confused and inappropriate) and consistently followed 1 step commands with mod verbal cues. Pt was oriented to person and place with Min semantic cues and total A to orient to situation. Pt required Max A verbal and visual cues for basic money management task and demonstrated sustained attention to functional task for 60 seconds with mod verbal cues. Pt expresses basic wants and needs and demonstrated purposeful behaviors by verbalizing wanting to go to bed and positioning in chair.     Session 2: Treatment focus on tolerance of PMSV. RN reported pt's inner cannula of trach was changed earlier in the afternoon. Pt  tolerated wearing PMSV for 40 minutes with cough X 1 and demonstrated adequate oxygenation and redirection of air to upper airway. Pt demonstrated adequate phonation at the word and phrase level and was 100% intelligibile. Pt demonstrated mild verbal agitation when asked to complete basic self-care tasks (grooming, oral care) and refused to participate in tasks, however, pt was easily redirectable. Pt required Min A question cues for orientation to place, time and situation and independently requested to use the bathroom. Recommend pt's trach be changed to cuffless.   FIM:  Comprehension Comprehension Mode: Auditory Comprehension: 3-Understands basic 50 - 74% of the time/requires cueing 25 - 50%  of the time Expression Expression Mode: Verbal Expression: 3-Expresses basic 50 - 74% of the time/requires cueing 25 - 50% of the time. Needs to repeat parts of sentences. Social Interaction Social Interaction: 2-Interacts appropriately 25 - 49% of time - Needs frequent redirection. Problem Solving Problem Solving: 1-Solves basic less than 25% of the time - needs direction nearly all the time or does not effectively solve problems and may need a restraint for safety Memory Memory: 1-Recognizes or recalls less than 25% of the time/requires cueing greater than 75% of the time  Pain Pain Assessment Pain Assessment: 0-10 Pain Score:   8 Pain Type: Acute pain Pain Location: Arm Pain Orientation: Right Pain Descriptors: Aching Pain Frequency: Occasional Pain Onset: Gradual Patients Stated Pain Goal: 2 Pain Intervention(s): Medication (See eMAR) (tylenol 650 mg po)  Therapy/Group: Individual Therapy  Jesselee Poth 08/13/2012, 3:41 PM

## 2012-08-13 NOTE — Progress Notes (Signed)
Inpatient Diabetes Program Recommendations  AACE/ADA: New Consensus Statement on Inpatient Glycemic Control (2013)  Target Ranges:  Prepandial:   less than 140 mg/dL      Peak postprandial:   less than 180 mg/dL (1-2 hours)      Critically ill patients:  140 - 180 mg/dL   Reason for Visit: Hyperglycemia followed by hypoglycemia  Inpatient Diabetes Program Recommendations Insulin - Meal Coverage: Noted tube feeds are to be administered 5 times per day. Would recommend glucose be checked at those times when the bolus feed is administered and use sensitive correction at this time.  Note: Thank you, Lenor Coffin, RN, CNS, Diabetes Coordinator [phone])

## 2012-08-13 NOTE — Progress Notes (Signed)
Patient tolerating room air o2 sat at 94 -96% during the day . Copious secretions noted early in shift and required deep suctioning of thick yellow mucus from trach x 4 today. Inner cannula changed x 2 . # 8 trach extra long intact . No residuals noted from peg today . Patient had liquid stool after osmolite bolus given . No further bm's noted . Continue with plan of care.                 Cleotilde Neer

## 2012-08-13 NOTE — Progress Notes (Signed)
Hypoglycemic Event  CBG: 68  Treatment: tube feeding bolus 240 ml  Symptoms: None  Follow-up CBG: Time:2212 CBG Result: 85  Possible Reasons for Event: Unknown  Comments/MD notified: Tolerated tube feeding well    Alex Foster  Remember to initiate Hypoglycemia Order Set & complete

## 2012-08-14 ENCOUNTER — Inpatient Hospital Stay (HOSPITAL_COMMUNITY): Payer: Medicaid Other | Admitting: Occupational Therapy

## 2012-08-14 ENCOUNTER — Inpatient Hospital Stay (HOSPITAL_COMMUNITY): Payer: Medicaid Other | Admitting: *Deleted

## 2012-08-14 ENCOUNTER — Encounter (HOSPITAL_COMMUNITY): Payer: Self-pay | Admitting: Occupational Therapy

## 2012-08-14 ENCOUNTER — Inpatient Hospital Stay (HOSPITAL_COMMUNITY): Payer: Medicaid Other | Admitting: Speech Pathology

## 2012-08-14 LAB — GLUCOSE, CAPILLARY
Glucose-Capillary: 149 mg/dL — ABNORMAL HIGH (ref 70–99)
Glucose-Capillary: 114 mg/dL — ABNORMAL HIGH (ref 70–99)

## 2012-08-14 LAB — PROTIME-INR: Prothrombin Time: 28.2 seconds — ABNORMAL HIGH (ref 11.6–15.2)

## 2012-08-14 NOTE — Progress Notes (Signed)
Occupational Therapy Session Note  Patient Details  Name: Alex Foster MRN: [id number] Date of Birth: 1946-06-06  Today's Date: 08/14/2012 Time: 1400-1445 Time Calculation (min): 45 min  Short Term Goals: Week 1:  OT Short Term Goal 1 (Week 1): Pt will transfer to toilet with mod A with +2 for safety OT Short Term Goal 2 (Week 1): Pt will perform 2/3 grooming tasks with mod A OT Short Term Goal 3 (Week 1): Pt will demonstrate sustained attention for 2 min with a functional tasks OT Short Term Goal 4 (Week 1): Pt will be oriented to self and place with max cuing (with environmental cues) OT Short Term Goal 5 (Week 1): Pt will be able to sort from 2 objects with mod cuing   Skilled Therapeutic Interventions/Progress Updates:    1:1 focus on orientation, recall of today's events (with mod cues with choices), functional activity with PMSV donned maintaining stats above 90% on and off trach color, functional ambulation with mod cuing for weight shifting and stepping, functional mobility propelling w/c with feet  Therapy Documentation Precautions:  Precautions Precautions: Fall Precaution Comments: L shoulder subluxation and L side hemiparesis. Peg tube, trach , right wrist fracture: in a short arm cast Restrictions Weight Bearing Restrictions: No Pain: Pain Assessment Pain Score:   4  See FIM for current functional status  Therapy/Group: Individual Therapy and Co-Treatment  [name] [facility] 08/14/2012, 3:15 PM

## 2012-08-14 NOTE — Progress Notes (Signed)
Social Work Patient ID: Alex Foster, male   DOB: 1946-09-08, 66 y.o.   MRN: [id number]  Have spoken with pt and brother, Duffy Rhody (via phone) today to review team conference.  Pt talking with this SW and engaged in jokes with OT.  Amazing improvements last few days with increased verbalization and physical functioning.  Brother very pleased with report and plans to be here over the weekend as usual.  Will continue to follow.  Alanah Sakuma

## 2012-08-14 NOTE — Progress Notes (Signed)
Physical Therapy Note  Patient Details  Name: Alex Foster MRN: [id number] Date of Birth: 08-24-1946 Today's Date: 08/14/2012  Time: 1430-1500 (co-tx with OT) 30 minutes  No c/o pain.  Pt continuously asking for water, able to be redirected but returns to topic after 2-3 minutes.  Treatment focused on pt increasing independence with w/c mobility using B LEs.  Pt able to propel with supervision-min A, cuing for use of L LE and attention to task.  Pt with increasing agitation and decreased frustration tolerance.  Gait with +2 assist with pt improving stride length B.  Overall pt improving physically and cognitively.  Individual therapy   Shaniquia Brafford 08/14/2012, 4:37 PM

## 2012-08-14 NOTE — Progress Notes (Signed)
Physical Therapy Note  Patient Details  Name: Alex Foster MRN: [id number] Date of Birth: [birth year] Today's Date: 08/14/2012  Time: [phone] 60 minutes  Co-tx with TR.  Pt with c/o R wrist pain throughout session, RN aware, attempted repositioning throughout session but pt unable to get comfortable.  Gait multiple attempts short distances with +2 assist for safety and equipment management, pt > 50%.  Pt with improved foot clearance during stepping, requires manual facilitation for wt shifts and initiation of each step. Standing balance while reaching for horseshoes with L UE.  Pt frustrated due to R UE pain and L UE weakness, refused to continue activity.  Seated poker game for attention, sequencing and awareness.  Pt required min A to initiate, good with turn taking and explaining rules, mod-max A for problem solving card game task.  Overall pt with much improved activity tolerance and interactions with staff today.  Individual therapy  Baran Kuhrt 08/14/2012, 11:39 AM
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
## 2012-08-14 NOTE — Progress Notes (Signed)
Speech Language Pathology Daily Session Notes  Patient Details  Name: Alex Foster MRN: [id number] Date of Birth: [birth year]  Today's Date: 08/14/2012  Session 1 Time: 1100-1130 Time Calculation: 30 min  Session 2 Time: 1300-1400 Time Calculation (min): 60 min  Short Term Goals: Week 1: SLP Short Term Goal 1 (Week 1): Pt will verbalize 3-4 word utterances with efficient breath support with PMSV with Max A multimodal cueing SLP Short Term Goal 2 (Week 1): Pt will follow 1 step commnads with Max mutlimodal cueing with 25% of opportunites  SLP Short Term Goal 3 (Week 1): Pt will demonstrate sustained attention to a functional task for 1 minute with Max multimodal cueing SLP Short Term Goal 4 (Week 1): Pt will increase orentation to person, place and situtation with Max mutlimodal cueing  Skilled Therapeutic Interventions:  Session 1: Treatment focus on tolerance of PMSV and PO trials. Pt performed oral care with Max verbal cues. Pt tolerated PMSV for ~30 minutes with adequate oxygenation and redirection of air through upper airway. Pt with a strong vocal quality with increased expression of wants/needs. Pt participated in trials of ice chips without overt s/s of aspiration, however, pt administered trial of thin via tsp and demonstrated a delayed swallow initiation and delayed coughing X 2.    Session 2: Treatment focus on cognitive goals, tolerance of PMSV and PO trials. Pt performed oral care with Min verbal cues and grooming tasks with hand over hand assist. Pt tolerated PMSV for ~60 minutes with adequate oxygenation and redirection of air through upper airway. Pt with a strong vocal quality with increased social interaction. Pt required Max question and verbal cues to orient to place, time and situation. Pt also independently demonstrated emergent awareness of deificts and reported, "I feel like I am half here." Pt participated in trials of ice chips without overt s/s of aspiration and  required hand over hand assist for self-feeding with LUE. Pt demonstrating behaviors consistent with a [place]o Level VI today.   FIM:  Comprehension Comprehension Mode: Auditory Comprehension: 4-Understands basic 75 - 89% of the time/requires cueing 10 - 24% of the time Expression Expression Mode: Verbal Expression: 3-Expresses basic 50 - 74% of the time/requires cueing 25 - 50% of the time. Needs to repeat parts of sentences. Social Interaction Social Interaction: 3-Interacts appropriately 50 - 74% of the time - May be physically or verbally inappropriate. Problem Solving Problem Solving: 1-Solves basic less than 25% of the time - needs direction nearly all the time or does not effectively solve problems and may need a restraint for safety Memory Memory: 1-Recognizes or recalls less than 25% of the time/requires cueing greater than 75% of the time  Pain Pain Assessment Pain Score:   4  Therapy/Group: Individual Therapy  Moussa Wiegand 08/14/2012, 4:11 PM

## 2012-08-14 NOTE — Progress Notes (Signed)
Inpatient Diabetes Program Recommendations  AACE/ADA: New Consensus Statement on Inpatient Glycemic Control (2013)  Target Ranges:  Prepandial:   less than 140 mg/dL      Peak postprandial:   less than 180 mg/dL (1-2 hours)      Critically ill patients:  140 - 180 mg/dL   Reason for Visit: Results for DENYM, RAHIMI (MRN [id number]) as of 08/14/2012 12:27  Ref. Range 08/13/2012 16:44 08/13/2012 17:49 08/13/2012 20:54 08/14/2012 00:15 08/14/2012 04:51 08/14/2012 07:40 08/14/2012 10:57  Glucose-Capillary Latest Range: 70-99 mg/dL 59 (L) 782 (H) 93 956 (H) 105 (H) 149 (H) 134 (H)   Patient still having occasional hypoglycemia.  Recommend further decrease in Lantus to 10 units daily and change sensitive correction to q 4 hours (prior to tube feed boluses).  Will follow.
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
## 2012-08-14 NOTE — Plan of Care (Signed)
Problem: RH SAFETY Goal: RH STG ADHERE TO SAFETY PRECAUTIONS W/ASSISTANCE/DEVICE STG Adhere to Safety Precautions With max Assistance/Device.  Outcome: Not Progressing  Patient with poor safety awareness. A. Alex Boissonneault,LPN

## 2012-08-15 ENCOUNTER — Inpatient Hospital Stay (HOSPITAL_COMMUNITY): Payer: Medicaid Other | Admitting: Speech Pathology

## 2012-08-15 ENCOUNTER — Inpatient Hospital Stay (HOSPITAL_COMMUNITY): Payer: Self-pay | Admitting: Occupational Therapy

## 2012-08-15 ENCOUNTER — Ambulatory Visit (HOSPITAL_COMMUNITY): Payer: Self-pay | Admitting: *Deleted

## 2012-08-15 ENCOUNTER — Inpatient Hospital Stay (HOSPITAL_COMMUNITY): Payer: Self-pay | Admitting: Physical Therapy

## 2012-08-15 ENCOUNTER — Encounter (HOSPITAL_COMMUNITY): Payer: Self-pay | Admitting: Occupational Therapy

## 2012-08-15 DIAGNOSIS — S069X9A Unspecified intracranial injury with loss of consciousness of unspecified duration, initial encounter: Secondary | ICD-10-CM

## 2012-08-15 LAB — GLUCOSE, CAPILLARY
Glucose-Capillary: 164 mg/dL — ABNORMAL HIGH (ref 70–99)
Glucose-Capillary: 167 mg/dL — ABNORMAL HIGH (ref 70–99)
Glucose-Capillary: 187 mg/dL — ABNORMAL HIGH (ref 70–99)
Glucose-Capillary: 208 mg/dL — ABNORMAL HIGH (ref 70–99)

## 2012-08-15 MED ORDER — LORAZEPAM 2 MG/ML IJ SOLN
1.0000 mg | INTRAMUSCULAR | Status: DC | PRN
  Administered 2012-08-15: 2 mg via INTRAMUSCULAR
  Filled 2012-08-15: qty 1

## 2012-08-15 NOTE — Progress Notes (Signed)
Physical Therapy Note  Patient Details  Name: Alex Foster MRN: [id number] Date of Birth: 08-26-1946 Today's Date: 08/15/2012  Time: 1000-1055 55 minutes  Pt c/o R UE pain with activity, eases with rest and repositioning.  Gait training with EVA walker. Pt able to increase distance to >50' without c/o R UE pain.  Pt resistive to extra help, wanting to walk "by myself".  Pt with no awareness of safety or deficits that would prevent walking independently.  Pt gait with +2 HHA on L, R UE around therapists shoulder with decreased c/o R UE pain.  Pt improving tolerance to gait training.  Attempted pipe tree activity for problem solving and B UE strengthening.  Pt easily frustrated by UE weakness, able to attend and engage x 5 minutes before increasing frustration.  W/c mobility on carpet and tile flooring with min A, increased distance.  Pt with language of confusion throughout treatment and decreased frustration tolerance, improving balance and activity tolerance.  Individual therapy   DONAWERTH,KAREN 08/15/2012, 10:56 AM
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
## 2012-08-15 NOTE — Progress Notes (Signed)
Occupational Therapy Session Note  Patient Details  Name: Alex Foster MRN: [id number] Date of Birth: [birth year]  Today's Date: 08/15/2012 Time: 1100-1200 Time Calculation (min): 60 min  Short Term Goals: Week 1:  OT Short Term Goal 1 (Week 1): Pt will transfer to toilet with mod A with +2 for safety OT Short Term Goal 2 (Week 1): Pt will perform 2/3 grooming tasks with mod A OT Short Term Goal 3 (Week 1): Pt will demonstrate sustained attention for 2 min with a functional tasks OT Short Term Goal 4 (Week 1): Pt will be oriented to self and place with max cuing (with environmental cues) OT Short Term Goal 5 (Week 1): Pt will be able to sort from 2 objects with mod cuing   Skilled Therapeutic Interventions/Progress Updates:    1:1 focus on orientation and using environmental cues, intellectual awareness, sustained to selective attention in minimally distracting environment, functional use of left UE in isolated exercises and with functional tasks. Pt continued to wear PMSV for entire hour on RA maintaining O2 stats above 90%. Took pt with supervisor down to ICU where pt had come from to assist with orientation. Engaged in functional word game "head bands" to work on generating thoughts on a specific topic and sustained attention. Pt demonstrated very concrete thinking and needed mod -max A to guess and give cues for word.  Therapy Documentation Precautions:  Precautions Precautions: Fall Precaution Comments: L shoulder subluxation and L side hemiparesis. Peg tube, trach , right wrist fracture: in a short arm cast Restrictions Weight Bearing Restrictions: No Pain: On going wrist pain - RN aware See FIM for current functional status  Therapy/Group: Individual Therapy  [name] [facility] 08/15/2012, 2:33 PM

## 2012-08-15 NOTE — Progress Notes (Signed)
Speech Language Pathology Daily Session Note  Patient Details  Name: Alex Foster MRN: [id number] Date of Birth: [birth year]  Today's Date: 08/15/2012 Time: 1400-1455 Time Calculation (min): 55 min  Short Term Goals: Week 1: SLP Short Term Goal 1 (Week 1): Pt will verbalize 3-4 word utterances with efficient breath support with PMSV with Max A multimodal cueing SLP Short Term Goal 2 (Week 1): Pt will follow 1 step commnads with Max mutlimodal cueing with 25% of opportunites  SLP Short Term Goal 3 (Week 1): Pt will demonstrate sustained attention to a functional task for 1 minute with Max multimodal cueing SLP Short Term Goal 4 (Week 1): Pt will increase orentation to person, place and situtation with Max mutlimodal cueing  Skilled Therapeutic Interventions: Treatment focus on tolerance of PMSV, PO trials and sustained attention. Pt performed oral care with Max verbal and tactile cues. Pt tolerated PMSV for ~60 minutes with adequate oxygenation and redirection of air through upper airway. Pt with a strong vocal quality with increased expression of wants/needs. Pt participated in trials of ice chips and thin liquids via tsp without overt s/s of aspiration, however, pt administered trial of thin via cup with hand over hand assist and demonstrated coughing with trials. Pt is making functional gains and recommend pt have a MBSS tomorrow to assess readiness for initiation of PO diet. Pt sustained attention to a functional task for ~5 minutes with Min verbal cues for redirection. Pt required max multimodal cueing to orient to place and situation and required Max encouragement to utilize LUE with functional tasks. Pt demonstrating increased language of confusion and decreased frustration tolerance today.    FIM:  Comprehension Comprehension Mode: Auditory Comprehension: 2-Understands basic 25 - 49% of the time/requires cueing 51 - 75% of the time Expression Expression Mode: Verbal Expression:  2-Expresses basic 25 - 49% of the time/requires cueing 50 - 75% of the time. Uses single words/gestures. Social Interaction Social Interaction: 2-Interacts appropriately 25 - 49% of time - Needs frequent redirection. Problem Solving Problem Solving: 1-Solves basic less than 25% of the time - needs direction nearly all the time or does not effectively solve problems and may need a restraint for safety Memory Memory: 1-Recognizes or recalls less than 25% of the time/requires cueing greater than 75% of the time  Pain Pain Assessment Pain Assessment: No/denies pain  Therapy/Group: Individual Therapy  Shrinika Blatz 08/15/2012, 3:34 PM

## 2012-08-15 NOTE — Progress Notes (Signed)
Recreational Therapy Session Note  Patient Details  Name: Alex Foster MRN: [id number] Date of Birth: 12-05-1945 Today's Date: 08/15/2012 Time:  07-1039 Pain: c/o UE pain, relieved with repositioning/rest Skilled Therapeutic Interventions/Progress Updates: Pt ambulated with EVA walker ~30' without c/o RUE pain.  Pt stated he wanted to walk by himself, somewhat resistant to help from therapists.  Pt with language of confusion today, and difficult to redirect.  Pt with no safety awareness, unable to recognize need for assistance with ambulation.  Pt propelled w/c ~10' using BLE's with min assist- min cues for encouragement.  Pt sate EOC to work on "pipe tree" with focus on selective attention, problem solving, & BUE use ~ 5 minutes before becoming frustrated.    Therapy/Group: Co-Treatment   Paulita Licklider 08/15/2012, 12:03 PM
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
## 2012-08-15 NOTE — Progress Notes (Signed)
Patient getting irritated kicking the bed rail trying to get out of the bed on his own. Medication given see MAR. Continue with plan of care
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
## 2012-08-16 ENCOUNTER — Inpatient Hospital Stay (HOSPITAL_COMMUNITY): Payer: Medicaid Other | Admitting: Speech Pathology

## 2012-08-16 ENCOUNTER — Encounter (HOSPITAL_COMMUNITY): Payer: Self-pay | Admitting: Occupational Therapy

## 2012-08-16 ENCOUNTER — Inpatient Hospital Stay (HOSPITAL_COMMUNITY): Payer: Medicaid Other

## 2012-08-16 ENCOUNTER — Inpatient Hospital Stay (HOSPITAL_COMMUNITY): Payer: Self-pay | Admitting: Physical Therapy

## 2012-08-16 ENCOUNTER — Inpatient Hospital Stay (HOSPITAL_COMMUNITY): Payer: Self-pay | Admitting: Occupational Therapy

## 2012-08-16 ENCOUNTER — Inpatient Hospital Stay (HOSPITAL_COMMUNITY): Payer: Medicaid Other | Admitting: Occupational Therapy

## 2012-08-16 LAB — GLUCOSE, CAPILLARY
Glucose-Capillary: 151 mg/dL — ABNORMAL HIGH (ref 70–99)
Glucose-Capillary: 205 mg/dL — ABNORMAL HIGH (ref 70–99)
Glucose-Capillary: 117 mg/dL — ABNORMAL HIGH (ref 70–99)

## 2012-08-16 LAB — PROTIME-INR: Prothrombin Time: 24.3 seconds — ABNORMAL HIGH (ref 11.6–15.2)

## 2012-08-16 MED ORDER — VITAL 1.5 CAL PO LIQD
237.0000 mL | ORAL | Status: DC
  Administered 2012-08-16 – 2012-08-19 (×22): 237 mL
  Filled 2012-08-16 (×32): qty 237

## 2012-08-16 NOTE — Progress Notes (Signed)
Alex Foster was not changed due to MD unavailability.

## 2012-08-16 NOTE — Progress Notes (Signed)
Speech Language Pathology Daily Session Note  Patient Details  Name: Shahid Amburn MRN: [id number] Date of Birth: 1946/09/12  Today's Date: 08/16/2012 Time: 1130-1200 Time Calculation (min): 30 min  Short Term Goals: Week 1: SLP Short Term Goal 1 (Week 1): Pt will verbalize 3-4 word utterances with efficient breath support with PMSV with Max A multimodal cueing SLP Short Term Goal 2 (Week 1): Pt will follow 1 step commnads with Max mutlimodal cueing with 25% of opportunites  SLP Short Term Goal 3 (Week 1): Pt will demonstrate sustained attention to a functional task for 1 minute with Max multimodal cueing SLP Short Term Goal 4 (Week 1): Pt will increase orentation to person, place and situtation with Max mutlimodal cueing SLP Short Term Goal 5 (Week 1): Pt will consume Dys. 1 textures and nectar thick liquids without overt s/s of aspiration with Mod A verbal cues for utilization of swallowing compensatory strategies.   Skilled Therapeutic Interventions: Treatment focus on cognitive-linguisitc goals. PMSV donned and pt's vital signs remained WFL, however, pt demonstrating excessive coughing after ~ 60 seconds. RN provided deep suctioning of pt which helped reduce coughing with PMSV and pt was then able to tolerate for remainder of session. Pt required total A multimodal cueing to orient to place and situation and hand over hand assist to perform oral care.    FIM:  Comprehension Comprehension Mode: Auditory Comprehension: 2-Understands basic 25 - 49% of the time/requires cueing 51 - 75% of the time Expression Expression Mode: Verbal Expression: 2-Expresses basic 25 - 49% of the time/requires cueing 50 - 75% of the time. Uses single words/gestures. Social Interaction Social Interaction: 2-Interacts appropriately 25 - 49% of time - Needs frequent redirection. Problem Solving Problem Solving: 1-Solves basic less than 25% of the time - needs direction nearly all the time or does not  effectively solve problems and may need a restraint for safety Memory Memory: 1-Recognizes or recalls less than 25% of the time/requires cueing greater than 75% of the time FIM - Eating Eating Activity: 5: Needs verbal cues/supervision;4: Help with managing cup/glass  Pain Pain Assessment Pain Assessment: No/denies pain  Therapy/Group: Individual Therapy  Emilia Kayes 08/16/2012, 5:05 PM

## 2012-08-16 NOTE — Plan of Care (Signed)
Problem: RH SAFETY Goal: RH STG ADHERE TO SAFETY PRECAUTIONS W/ASSISTANCE/DEVICE STG Adhere to Safety Precautions With max Assistance/Device.  Outcome: Not Progressing Patient with poor safety awareness

## 2012-08-16 NOTE — Progress Notes (Signed)
Occupational Therapy Weekly Progress Note  Patient Details  Name: Alex Foster MRN: [id number] Date of Birth: 12-15-1945  Today's Date: 08/16/2012  Patient has met 5 of 5 short term goals.  Pt is [place] Level V emerging into VI. Pt can demonstrate sustained attention 10 min a quiet environment. Pt requires max to total A for intellectual awareness. Pt still requires total A for bathing and dressing, toileting. Pt is mod A for transfers (+2 for safety 75% of the time). Pt has a short UE cast now on right UE for fx ulna. 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   Patient continues to demonstrate the following deficits: muscle weakness, decreased cardiorespiratoy endurance and decreased oxygen support, impaired timing and sequencing, abnormal tone, unbalanced muscle activation, motor apraxia, decreased coordination and decreased motor planning, decreased visual acuity, decreased visual perceptual skills, decreased visual motor skills and hemianopsia, decreased attention to left and decreased motor planning, decreased attention, decreased awareness, decreased problem solving, decreased safety awareness, decreased memory and delayed processing and decreased sitting balance, decreased standing balance, decreased postural control, hemiplegia and decreased balance strategies and therefore will continue to benefit from skilled OT intervention to enhance overall performance with BADL.  Patient progressing toward long term goals..  Continue plan of care.  OT Short Term Goals Week 1:  OT Short Term Goal 1 (Week 1): Pt will transfer to toilet with mod A with +2 for safety OT Short Term Goal 1 - Progress (Week 1): Met OT Short Term Goal 2 (Week 1): Pt will perform 2/3 grooming tasks with mod A OT Short Term Goal 2 - Progress (Week 1): Met OT Short Term Goal 3 (Week 1): Pt will demonstrate sustained attention for 2 min with a functional tasks OT Short Term Goal 3 - Progress (Week  1): Met OT Short Term Goal 4 (Week 1): Pt will be oriented to self and place with max cuing (with environmental cues) OT Short Term Goal 4 - Progress (Week 1): Met OT Short Term Goal 5 (Week 1): Pt will be able to sort from 2 objects with mod cuing  OT Short Term Goal 5 - Progress (Week 1): Met Week 2:  OT Short Term Goal 1 (Week 2): Pt would don shirt with mod A  OT Short Term Goal 2 (Week 2): Pt would maintain sitting balance for 5 min with supervision during functional tasks OT Short Term Goal 3 (Week 2): Pt will be oriented to self, place and month with min environmental cues OT Short Term Goal 4 (Week 2): Pt would transfer with min A to toilet/BSC  Skilled Therapeutic Interventions/Progress Updates:    continue with POC  Therapy Documentation Precautions:  Precautions Precautions: Fall Precaution Comments: L shoulder subluxation and L side hemiparesis. Peg tube, trach , right wrist fracture: in a short arm cast Restrictions Weight Bearing Restrictions: No Pain:  ongoing pain in right UE  See FIM for current functional status  Therapy/Group: Individual Therapy  [name] [facility] 08/16/2012, 4:04 PM

## 2012-08-16 NOTE — Progress Notes (Signed)
Occupational Therapy Session Note  Patient Details  Name: Alex Foster MRN: [id number] Date of Birth: 1946/05/13  Today's Date: 08/16/2012 Time: 0830-0900 Time Calculation (min): 30 min  Short Term Goals: Week 1:  OT Short Term Goal 1 (Week 1): Pt will transfer to toilet with mod A with +2 for safety OT Short Term Goal 2 (Week 1): Pt will perform 2/3 grooming tasks with mod A OT Short Term Goal 3 (Week 1): Pt will demonstrate sustained attention for 2 min with a functional tasks OT Short Term Goal 4 (Week 1): Pt will be oriented to self and place with max cuing (with environmental cues) OT Short Term Goal 5 (Week 1): Pt will be able to sort from 2 objects with mod cuing   Skilled Therapeutic Interventions/Progress Updates:    1:1 in bed resting. RN reported pt having a difficult night last night. Pt pushing therapist away and pinching me when trying to take O2 stat. Focus of session to allowing with pt allowing care to be done to him- RT suctioning pt and RN giving meds. Pt restless and upset with any attempt to engage with him in basic self care. RT was suppose to downsize trach but pt at this time will not allow this care to be done.  Therapy Documentation Precautions:  Precautions Precautions: Fall Precaution Comments: L shoulder subluxation and L side hemiparesis. Peg tube, trach , right wrist fracture: in a short arm cast Restrictions Weight Bearing Restrictions: No General: General Amount of Missed OT Time (min): 30 Minutes Pain:  pain in right wrist - RN made aware  See FIM for current functional status  Therapy/Group: Individual Therapy  [name] [facility] 08/16/2012, 9:06 AM

## 2012-08-16 NOTE — Progress Notes (Signed)
Occupational Therapy Session Note  Patient Details  Name: Alex Foster MRN: [id number] Date of Birth: 1946/04/06  Today's Date: 08/16/2012 Time: 1400-1415 Time Calculation (min): 15 min  Short Term Goals: Week 1:  OT Short Term Goal 1 (Week 1): Pt will transfer to toilet with mod A with +2 for safety OT Short Term Goal 2 (Week 1): Pt will perform 2/3 grooming tasks with mod A OT Short Term Goal 3 (Week 1): Pt will demonstrate sustained attention for 2 min with a functional tasks OT Short Term Goal 4 (Week 1): Pt will be oriented to self and place with max cuing (with environmental cues) OT Short Term Goal 5 (Week 1): Pt will be able to sort from 2 objects with mod cuing   Skilled Therapeutic Interventions/Progress Updates:    co treatment with PT: focus on intellectual awareness, sustained to selective attention with self care tasks. Pt willing to get out of bed with first request; min a supine to sit with mod verbal cues for sequencing. Functional ambulation with +2 to bathroom for toilet transfer and toileting (no void). Pt with decreased left attention to left UE and awareness of its incoordination and hemiparesis. Pt PT tried a platform walker (for right UE); pt able to grip with left hand but needs more left UB support due to weight of weak arm pulling posture forward and down. +2 for control of RW and weight shifts; decreased awareness of poor posture.   Therapy Documentation Precautions:  Precautions Precautions: Fall Precaution Comments: L shoulder subluxation and L side hemiparesis. Peg tube, trach , right wrist fracture: in a short arm cast Restrictions Weight Bearing Restrictions: No Pain:  mild c/o pain in right wrist- RN aware   See FIM for current functional status  Therapy/Group: Individual Therapy and Co-Treatment  [name] [facility] 08/16/2012, 3:15 PM

## 2012-08-16 NOTE — Progress Notes (Signed)
Physical Therapy Note  Patient Details  Name: Dimario Hammerschmidt MRN: [id number] Date of Birth: [birth year] Today's Date: 08/16/2012  Time: 1000  Pt found resting comfortably in bed.  When asked he states, "I feel horrible".  Pt unable to tell PT where he hurts or why he feels uncomfortable.  Pt non agitated at rest, becomes more agitated when asked to get OOB or participate in therapeutic activity.  Pt left in bed in non agitated state for pt and staff safety.\  Missed 60 minutes skilled PT.   Deyanna Mctier 08/16/2012, 10:10 AM

## 2012-08-16 NOTE — Progress Notes (Signed)
Physical Therapy Session Note  Patient Details  Name: Alex Foster MRN: [id number] Date of Birth: [birth year]  Today's Date: 08/16/2012 Time: 2440-1027 Time Calculation (min): 15 min (15 minutes missed)   Skilled Therapeutic Interventions/Progress Updates: Co-treatment with occupational therapist.  Pt found resting comfortably in bed. Patient reported to have had a difficult night and had been very agitated.  Patient is now tired and resting comfortably.  Patient denied pain, declined participation in therapeutic activity or need for self-care activities at this time, preferring to sleep and rest.  Patient left in bed in non-agitated state for patient safety.     Therapy Documentation Precautions:  Precautions Precautions: Fall Precaution Comments: L shoulder subluxation and L side hemiparesis. Peg tube, trach , right wrist fracture: in a short arm cast Restrictions Weight Bearing Restrictions: No    Pain: Pain Assessment Pain Score:  Faces Pain Scale: No hurt PAINAD (Pain Assessment in Advanced Dementia) Negative Vocalization: none Critical Care Pain Observation Tool (CPOT) Facial Expression: Relaxed, neutral  See FIM for current functional status  Therapy/Group: Co-Treatment  Newman Pies A 08/16/2012, 10:13 AM
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
## 2012-08-16 NOTE — Progress Notes (Signed)
Physical Therapy Weekly Progress Note  Patient Details  Name: Alex Foster MRN: [id number] Date of Birth: [birth year]  Today's Date: 08/16/2012  Patient has met 4 of 4 short term goals.  Pt has improved balance, transfers, w/c mobility and attention.  Pt able to demo sustained attention to functional tasks in a quiet environment and is able to transfer with mod A.  Pt requires min A to propel w/c with B LEs and is able to gait with +2 assist needed for safety.  Pt with improving participation and engagement with therapy.  Patient continues to demonstrate the following deficits: attention, awareness, gait, balance, mobility, strength, activity tolerance and therefore will continue to benefit from skilled PT intervention to enhance overall performance with activity tolerance, balance, postural control, ability to compensate for deficits, attention, awareness, coordination and knowledge of precautions.  Patient progressing toward long term goals..  Continue plan of care.  PT Short Term Goals Week 1:  PT Short Term Goal 1 (Week 1): Pt will perform bed mobility with Mod A  PT Short Term Goal 1 - Progress (Week 1): Met PT Short Term Goal 2 (Week 1): Pt will perform bed<>chair transfer with Max A PT Short Term Goal 2 - Progress (Week 1): Met PT Short Term Goal 3 (Week 1): Pt will propell WC x 25' with Min A  PT Short Term Goal 3 - Progress (Week 1): Met PT Short Term Goal 4 (Week 1): Pt will demonstrate unsupported dynamic sitting balance with Min A for PT Short Term Goal 4 - Progress (Week 1): Met Week 2:  PT Short Term Goal 1 (Week 2): Pt will perform functional transfers with min A PT Short Term Goal 2 (Week 2): Pt will propel w/c with supervision 25' PT Short Term Goal 3 (Week 2): Pt will demo intellectual awareness during functional task with mod A PT Short Term Goal 4 (Week 2): Pt will gait with max A 10' in controlled environment    See FIM for current functional  status    [name] 08/16/2012, 4:59 PM

## 2012-08-16 NOTE — Progress Notes (Signed)
Physical Therapy Note  Patient Details  Name: Alex Foster MRN: [id number] Date of Birth: 1946-03-27 Today's Date: 08/16/2012  Time: 1400-1445 45 minutes (30 minutes co-tx with OT)  No c/o pain.  Pt agreeable to get OOB to try to use bathroom and practice walking.  Pt gait with +2 HHA with min-mod A.  Manual facilitation for wt shifts, cues for posture and safety as well as sequencing of stepping with L LE.  Toileting with +2 assist for balance/clothing management.  Attempt gait with PFRW with +2 assist.  Pt may benefit from B platforms.  Pt becomes frustrated with +2 assist as he verbalizes "I want to try this myself".  Pt continues to require +2 for safety and equipment management.  Attention/awareness and L UE coordination with hand over hand assist for pt to drink apple juice (pt placed on nectar thick liquids today).  Pt requires mod-max cuing for awareness of deficits while attempting to use L UE to raise cup to lips.  Mod-max cues for safety awareness for small sips.  Overall pt with good participation throughout session.  Individual therapy   Kasper Mudrick 08/16/2012, 2:55 PM
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
## 2012-08-17 ENCOUNTER — Inpatient Hospital Stay (HOSPITAL_COMMUNITY): Payer: Medicaid Other | Admitting: Physical Therapy

## 2012-08-17 ENCOUNTER — Inpatient Hospital Stay (HOSPITAL_COMMUNITY): Payer: Medicaid Other | Admitting: Speech Pathology

## 2012-08-17 DIAGNOSIS — S069X9A Unspecified intracranial injury with loss of consciousness of unspecified duration, initial encounter: Secondary | ICD-10-CM

## 2012-08-17 LAB — GLUCOSE, CAPILLARY
Glucose-Capillary: 134 mg/dL — ABNORMAL HIGH (ref 70–99)
Glucose-Capillary: 151 mg/dL — ABNORMAL HIGH (ref 70–99)
Glucose-Capillary: 176 mg/dL — ABNORMAL HIGH (ref 70–99)
Glucose-Capillary: 126 mg/dL — ABNORMAL HIGH (ref 70–99)
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
## 2012-08-17 NOTE — Progress Notes (Signed)
Physical Therapy Session Note  Patient Details  Name: Alex Foster MRN: [id number] Date of Birth: 1946/08/03  Today's Date: 08/17/2012 Time: 1100-1130 Time Calculation (min): 30 min  Short Term Goals: Week 1:  PT Short Term Goal 1 (Week 1): Pt will perform bed mobility with Mod A  PT Short Term Goal 1 - Progress (Week 1): Met PT Short Term Goal 2 (Week 1): Pt will perform bed<>chair transfer with Max A PT Short Term Goal 2 - Progress (Week 1): Met PT Short Term Goal 3 (Week 1): Pt will propell WC x 25' with Min A  PT Short Term Goal 3 - Progress (Week 1): Met PT Short Term Goal 4 (Week 1): Pt will demonstrate unsupported dynamic sitting balance with Min A for PT Short Term Goal 4 - Progress (Week 1): Met  Therapy Documentation Precautions:  Precautions Precautions: Fall Precaution Comments: L shoulder subluxation and L side hemiparesis. Peg tube, trach , right wrist fracture: in a short arm cast Restrictions Weight Bearing Restrictions: No Vital Signs: Therapy Vitals Pulse Rate: 64  Oxygen Therapy SpO2: 97 % O2 Device: Trach collar O2 Flow Rate (L/min): 5 L/min FiO2 (%): 28 % Pain: Pain Assessment Pain Assessment: 0-10 Pain Score:   7 Pain Type: Acute pain Pain Location: Arm Pain Orientation: Right Pain Intervention(s): Medication (See eMAR)  Therapeutic Exercise:(15') marching in place 4 x 50, 1/4 squats, seated B LE exercises Therapeutic Activity:(15') multiple transfers sit<->stand with Mod-A, verbal cues for L UE but patient consistently neglected L UE  See FIM for current functional status  Therapy/Group: Individual Therapy  Dino Borntreger J 08/17/2012, 11:07 AM

## 2012-08-17 NOTE — Progress Notes (Signed)
Speech Language Pathology Daily Session Note  Patient Details  Name: Alex Foster MRN: [id number] Date of Birth: [birth year]  Today's Date: 08/17/2012 Time: 0800-0840 Time Calculation (min): 40 min  Short Term Goals: Week 1: SLP Short Term Goal 1 (Week 1): Pt will verbalize 3-4 word utterances with efficient breath support with PMSV with Max A multimodal cueing SLP Short Term Goal 2 (Week 1): Pt will follow 1 step commnads with Max mutlimodal cueing with 25% of opportunites  SLP Short Term Goal 3 (Week 1): Pt will demonstrate sustained attention to a functional task for 1 minute with Max multimodal cueing SLP Short Term Goal 4 (Week 1): Pt will increase orentation to person, place and situtation with Max mutlimodal cueing SLP Short Term Goal 5 (Week 1): Pt will consume Dys. 1 textures and nectar thick liquids without overt s/s of aspiration with Mod A verbal cues for utilization of swallowing compensatory strategies.   Skilled Therapeutic Interventions: Treatment focus on cognitive-linguistic goals. Upon entering the room, the pt had independently utilized his call bell to ask to get out of bed. Pt reported "he had spent too much time in bed and needed to get up." Pt followed 1 step commands with 100% accuracy with Min verbal cues to help assist with changing his brief and donning his pants. PMSV donned and pt demonstrating excessive coughing. Pt had a size 6 inner cannula inside his size 8 trach. RN made aware and inner cannula was changed and pt was suctioned. Pt did not demonstrate any further difficulty tolerating his PMSV. Pt independently verbalized need for pain medicine and was able to appropriately rate his pain. Pt was oriented and asked if his family was going to come see him today. Pt then verbalized he was going to behave today and apologized for getting "aggravated" with the staff at times. Pt also initiated social conversation with clinician.    FIM:  Comprehension Comprehension  Mode: Auditory Comprehension: 2-Understands basic 25 - 49% of the time/requires cueing 51 - 75% of the time Expression Expression Mode: Verbal Expression: 2-Expresses basic 25 - 49% of the time/requires cueing 50 - 75% of the time. Uses single words/gestures. Social Interaction Social Interaction: 2-Interacts appropriately 25 - 49% of time - Needs frequent redirection. Problem Solving Problem Solving: 2-Solves basic 25 - 49% of the time - needs direction more than half the time to initiate, plan or complete simple activities Memory Memory: 1-Recognizes or recalls less than 25% of the time/requires cueing greater than 75% of the time  Pain 9/10 in lower back and right arm. RN made aware and medications given.   Therapy/Group: Individual Therapy  Kein Carlberg 08/17/2012, 9:18 AM

## 2012-08-18 ENCOUNTER — Inpatient Hospital Stay (HOSPITAL_COMMUNITY): Payer: Medicaid Other | Admitting: *Deleted

## 2012-08-18 LAB — GLUCOSE, CAPILLARY
Glucose-Capillary: 135 mg/dL — ABNORMAL HIGH (ref 70–99)
Glucose-Capillary: 183 mg/dL — ABNORMAL HIGH (ref 70–99)
Glucose-Capillary: 201 mg/dL — ABNORMAL HIGH (ref 70–99)
Glucose-Capillary: 188 mg/dL — ABNORMAL HIGH (ref 70–99)
Glucose-Capillary: 159 mg/dL — ABNORMAL HIGH (ref 70–99)

## 2012-08-18 MED ORDER — QUETIAPINE FUMARATE 50 MG PO TABS
50.0000 mg | ORAL_TABLET | Freq: Every day | ORAL | Status: DC
  Administered 2012-08-18 – 2012-09-01 (×15): 50 mg via ORAL
  Filled 2012-08-18 (×16): qty 1

## 2012-08-18 NOTE — Progress Notes (Signed)
Replaced high volume jet nebulizer and circuit.

## 2012-08-18 NOTE — Progress Notes (Signed)
Pt has been pulling his trache collar off.
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
## 2012-08-18 NOTE — Progress Notes (Signed)
Occupational Therapy Session Note  Patient Details  Name: Alex Foster MRN: [id number] Date of Birth: 12-23-1945  Today's Date: 08/18/2012 Time:  -   1630-1710  ( )    Short Term Goals: Week 1:  OT Short Term Goal 1 (Week 1): Pt will transfer to toilet with mod A with +2 for safety OT Short Term Goal 1 - Progress (Week 1): Met OT Short Term Goal 2 (Week 1): Pt will perform 2/3 grooming tasks with mod A OT Short Term Goal 2 - Progress (Week 1): Met OT Short Term Goal 3 (Week 1): Pt will demonstrate sustained attention for 2 min with a functional tasks OT Short Term Goal 3 - Progress (Week 1): Met OT Short Term Goal 4 (Week 1): Pt will be oriented to self and place with max cuing (with environmental cues) OT Short Term Goal 4 - Progress (Week 1): Met OT Short Term Goal 5 (Week 1): Pt will be able to sort from 2 objects with mod cuing  OT Short Term Goal 5 - Progress (Week 1): Met Week 2:  OT Short Term Goal 1 (Week 2): Pt would don shirt with mod A  OT Short Term Goal 2 (Week 2): Pt would maintain sitting balance for 5 min with supervision during functional tasks OT Short Term Goal 3 (Week 2): Pt will be oriented to self, place and month with min environmental cues OT Short Term Goal 4 (Week 2): Pt would transfer with min A to toilet/BSC  Skilled Therapeutic Interventions/Progress Updates:    Engaged pt in functional tasks at sink.  Pt. Combed hair, donned shoes. And donned/doffed glasses.  He refused to shave saying his brother was bringing him an Neurosurgeon on Monday.  Pt oriented to self only.  He read calendar day, date, year. He reported he was 66 yo.   He performed dynamic sitting balance with reaching to floor to obtain items for 3 minutes before tiring.  Pt. Needed manual assistance to raise L stem of eyeglasses indicated some inattention to left.  He maintained sustained attention for 15 minutes in quiet environment.  Used LUE hand in functional tasks with him complaining  that he could not get his left hand to work right.      Therapy Documentation Precautions:  Precautions Precautions: Fall Precaution Comments: L shoulder subluxation and L side hemiparesis. Peg tube, trach , right wrist fracture: in a short arm cast Restrictions Weight Bearing Restrictions: No    Vital Signs: Oxygen Therapy SpO2: 92 % O2 Device: Trach collar O2 Flow Rate (L/min): 5 L/min FiO2 (%): 28 % Pain: Complained of pain in buttocks and low back.  No number given   Therapy/Group: Individual Therapy  Humberto Seals 08/18/2012, 5:15 PM

## 2012-08-19 ENCOUNTER — Inpatient Hospital Stay (HOSPITAL_COMMUNITY): Payer: Medicaid Other | Admitting: Occupational Therapy

## 2012-08-19 ENCOUNTER — Inpatient Hospital Stay (HOSPITAL_COMMUNITY): Payer: Medicaid Other | Admitting: Speech Pathology

## 2012-08-19 ENCOUNTER — Inpatient Hospital Stay (HOSPITAL_COMMUNITY): Payer: Self-pay | Admitting: Occupational Therapy

## 2012-08-19 ENCOUNTER — Inpatient Hospital Stay (HOSPITAL_COMMUNITY): Payer: Medicaid Other | Admitting: Physical Therapy

## 2012-08-19 DIAGNOSIS — S069X9A Unspecified intracranial injury with loss of consciousness of unspecified duration, initial encounter: Secondary | ICD-10-CM

## 2012-08-19 LAB — CBC
HCT: 33.9 % — ABNORMAL LOW (ref 39.0–52.0)
MCH: 27.4 pg (ref 26.0–34.0)
MCHC: 32.2 g/dL (ref 30.0–36.0)
MCV: 85.2 fL (ref 78.0–100.0)
RDW: 15.2 % (ref 11.5–15.5)
WBC: 5.9 10*3/uL (ref 4.0–10.5)

## 2012-08-19 LAB — GLUCOSE, CAPILLARY
Glucose-Capillary: 188 mg/dL — ABNORMAL HIGH (ref 70–99)
Glucose-Capillary: 156 mg/dL — ABNORMAL HIGH (ref 70–99)
Glucose-Capillary: 71 mg/dL (ref 70–99)

## 2012-08-19 MED ORDER — WARFARIN SODIUM 7.5 MG PO TABS
7.5000 mg | ORAL_TABLET | Freq: Once | ORAL | Status: AC
  Administered 2012-08-19: 7.5 mg via ORAL
  Filled 2012-08-19: qty 1
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
## 2012-08-19 NOTE — Progress Notes (Signed)
Occupational Therapy Session Note  Patient Details  Name: Alex Foster MRN: [id number] Date of Birth: September 13, 1946  Today's Date: 08/19/2012 Time: 1340-1445 Time Calculation (min): 65 min  Short Term Goals: Week 2:  OT Short Term Goal 1 (Week 2): Pt would don shirt with mod A  OT Short Term Goal 2 (Week 2): Pt would maintain sitting balance for 5 min with supervision during functional tasks OT Short Term Goal 3 (Week 2): Pt will be oriented to self, place and month with min environmental cues OT Short Term Goal 4 (Week 2): Pt would transfer with min A to toilet/BSC  Skilled Therapeutic Interventions/Progress Updates:    Pt worked on visual scanning, cognitive/visual processing, and LUE functional use during therapeutic activity.  Pt needed max questioning cueing to sequence through [place] 3 dimensional puzzle, having to replicate the image he was seeing.  Needed mod facilitation for LUE functional use.  Unable to discriminate lengths of the pieces from the scaled picture to the actual puzzle.  Also needed mod instructional cueing to scan further to the left side to locate needed pieces.  Attempted standing to perform task but he was only able to tolerate standing for 1 minute before complaining of back pain and needing to sit.  Pt still with moderate motor apraxia with attempted LUE use and decreased ability to grasp the piece efficiently with the left hand.  Would turn his eyes away from his hand in anticipation of placing the piece and would drop it instead.  Max instructional cueing to maintain visual attention on his hand when attempting use.  He was able to recall that I had helped him with his bathing and dressing earlier in the morning, when asked.   Therapy Documentation Precautions:  Precautions Precautions: Fall Precaution Comments: L shoulder subluxation and L side hemiparesis. Peg tube, trach , right wrist fracture: in a short arm cast Restrictions Weight Bearing Restrictions:  No  Vital Signs: Therapy Vitals Pulse Rate: 76  Resp: 18  Patient Position, if appropriate: Sitting Oxygen Therapy SpO2: 94 % O2 Device: None (Room air) O2 Flow Rate (L/min): 5 L/min FiO2 (%): 28 % Pulse Oximetry Type: Intermittent Pain: Pain Assessment Pain Assessment: No/denies pain  See FIM for current functional status  Therapy/Group: Individual Therapy  Kumiko Fishman OTR/L 08/19/2012, 3:49 PM

## 2012-08-19 NOTE — Progress Notes (Signed)
Nutrition Follow-up  Intervention:   Continue current TF.  Monitor for diet advancement and need to decrease TF.  Assessment:   Spoke with RN.  Pt tolerating TF well.  Formed BM today.  TF meeting estimated needs.  Taking PO with SLP only.  Diet Order:  NPO except for Dysphagia 1 nectar thick liquids with SLP only TF:  Osmolite 1.5 1 can every 4 hours (6 cans daily). 30 ml Prostat tid and free water 220 ml 6 times daily which provides:  2430 kcal, 141 gm protein, 2406 ml free water via gastrostomy tube  Meds: Scheduled Meds:   . amantadine  100 mg Per Tube BID  . antiseptic oral rinse  15 mL Mouth Rinse QID  . chlorhexidine  15 mL Mouth Rinse BID  . citalopram  10 mg Per Tube Daily  . clonazePAM  0.5 mg Oral QHS  . feeding supplement  30 mL Per Tube TID WC  . feeding supplement (VITAL 1.5 CAL)  237 mL Per Tube Q4H  . free water  220 mL Per Tube 6 X Daily  . glycopyrrolate  1 mg Per Tube BID  . guaifenesin  300 mg Per Tube Q6H  . insulin aspart  0-5 Units Subcutaneous QHS  . insulin aspart  0-9 Units Subcutaneous TID WC  . metFORMIN  500 mg Oral BID WC  . pantoprazole sodium  40 mg Per Tube Q1200  . propranolol  30 mg Per Tube Q12H  . QUEtiapine  50 mg Oral QHS  . Tamsulosin HCl  0.8 mg Oral QHS  . warfarin  7.5 mg Oral ONCE-1800  . Warfarin - Pharmacist Dosing Inpatient   Does not apply q1800  . [DISCONTINUED] warfarin  6 mg Per Tube q1800   Continuous Infusions:  PRN Meds:.acetaminophen (TYLENOL) oral liquid 160 mg/5 mL, acetaminophen, albuterol, bisacodyl, guaifenesin, ipratropium, LORazepam, ondansetron (ZOFRAN) IV, ondansetron, oxyCODONE, senna-docusate, sorbitol, traZODone   CMP     Component Value Date/Time   NA 136 08/09/2012 0505   K 4.3 08/09/2012 0505   CL 100 08/09/2012 0505   CO2 31 08/09/2012 0505   GLUCOSE 107* 08/09/2012 0505   BUN 25* 08/09/2012 0505   CREATININE 0.82 08/09/2012 0505   CREATININE 1.15 03/13/2012 1043   CALCIUM 8.9 08/09/2012 0505   PROT 6.8  08/09/2012 0505   ALBUMIN 2.7* 08/09/2012 0505   AST 18 08/09/2012 0505   ALT 10 08/09/2012 0505   ALKPHOS 151* 08/09/2012 0505   BILITOT 0.5 08/09/2012 0505   GFRNONAA >90 08/09/2012 0505   GFRAA >90 08/09/2012 0505    CBG (last 3)   Basename 08/19/12 0740 08/18/12 2110 08/18/12 1727  GLUCAP 193* 159* 144*     Intake/Output Summary (Last 24 hours) at 08/19/12 1124 Last data filed at 08/19/12 0900  Gross per 24 hour  Intake   1111 ml  Output    452 ml  Net    659 ml    Weight Status:  207# (11/15)-stable  Re-estimated needs:  2340-2630 kcal, 160-180 gm protein  Nutrition Dx:  Inadequate oral intake r/t inability to eat AEB NPO status and need for PEG for nutrition therapy  Goal:  Pt to meet >/=90% of their estimated nutrition needs  Monitor:  Weight trends, lab trends, I/O's, TF tolerance and adequacy   Oran Rein, RD, LDN Clinical Inpatient Dietitian Pager:  [phone] Weekend and after hours pager:  [phone]

## 2012-08-19 NOTE — Progress Notes (Signed)
Physical Therapy Note  Patient Details  Name: Alex Foster MRN: [id number] Date of Birth: July 28, 1946 Today's Date: 08/19/2012  Time: 1055-1150 55 minutes  Pt c/o R UE pain, RN made aware.  W/c mobility throughout unit with B LEs with min A, pt easily frustrated, asking for help when he is challenged.  Gait training with +1 max A, pt able to advance B LEs, requires cues for attention and safety to L LE.  Requires mod A for balance reactions and wt shifting.  Pt unsafe, with decreased problem solving requiring max A for turning to sit safely.  Nu step for attention and activity tolerance, pt able to tolerate x 90 seconds before stating "I'm about done with this", unable to encourage pt to continue to participate in any therapeutic task.  Pt propelled w/c to his room with min A, min-mod A SPT to bed.  Pt with improving balance but still limited by poor safety awareness and attention.  Individual therapy   Abigale Dorow 08/19/2012, 11:50 AM
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
## 2012-08-19 NOTE — Progress Notes (Signed)
Speech Language Pathology Daily Session Note  Patient Details  Name: Alex Foster MRN: [id number] Date of Birth: 1946/06/06  Today's Date: 08/19/2012 Time: 1300-1340 Time Calculation (min): 40 min  Short Term Goals: Week 2: SLP Short Term Goal 1 (Week 2): Pt will follow 2 step commands with 50% accuracy with Mod verbal cues  SLP Short Term Goal 2 (Week 2): Pt will demonstrate selective attention in a midly distracting enviornemnt for ~10 minutes with Mod A verbal cues for redirection SLP Short Term Goal 3 (Week 2): Pt will demonstrate intellectual awareness of person, place, time and situation with Mod A multimodal cueing SLP Short Term Goal 4 (Week 2): Pt will consume Dys. 1 textures and nectar-thick liquids with Min A verbal cues for utilization of swallowing compensatory strategies.  SLP Short Term Goal 5 (Week 2): Pt will demonstrate functional problem solving for familiar tasks with Mod A multimodal cueing   Skilled Therapeutic Interventions: Treatment focus on cognitive and dysphagia goals. Pt upon entering room pt requested to get out of bed. Pt followed 1 step directions to help clinician safely transfer from the bed to the wheelchair with Min verbal cues. Pt consumed puree textures without overt s/s of aspiration, however, pt only consumed 25 % due to fear that it would "upset his stomach." Pt required hand over hand (total A) for self-feeding.  Pt sustained attention to task in a mildly distracting environment for ~30 minutes with Min A verbal cues. Pt recalled morning events with Mod verbal cues and required contextual cueing to orient to place. Pt unable to utilize schedule to recall next therapy session, suspect due to visual impairments.    FIM:  Comprehension Comprehension Mode: Auditory Comprehension: 2-Understands basic 25 - 49% of the time/requires cueing 51 - 75% of the time Expression Expression Mode: Verbal Expression: 2-Expresses basic 25 - 49% of the time/requires  cueing 50 - 75% of the time. Uses single words/gestures. Social Interaction Social Interaction: 2-Interacts appropriately 25 - 49% of time - Needs frequent redirection. Problem Solving Problem Solving: 2-Solves basic 25 - 49% of the time - needs direction more than half the time to initiate, plan or complete simple activities Memory Memory: 1-Recognizes or recalls less than 25% of the time/requires cueing greater than 75% of the time FIM - Eating Eating Activity: 4: Help with managing cup/glass  Pain No/Denies Pain  Therapy/Group: Individual Therapy  Eliazer Hemphill 08/19/2012, 3:03 PM
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
## 2012-08-19 NOTE — Progress Notes (Addendum)
Speech Language Pathology Session & Weekly Progress Notes  Patient Details  Name: Alex Foster MRN: [id number] Date of Birth: 1946-03-03  Today's Date: 08/19/2012 Time: 8295-6213 Time Calculation (min): 45 min  Skilled Therapeutic Intervention: Treatment focus on cognitive and dysphagia goals. Pt awake and alert and sitting in his wheelchair  with his PMSV donned upon entering his room.  Pt independently performed oral care and consumed nectar thick liquids via tsp (unable to manage cup and was becoming frustrated with clinician when assisting) without overt s/s of aspiration. Pt participated in task of reading sentences aloud and matching to the correspondent picture. Pt demonstrated neologisms while reading aloud and required total A to self-monitor and correct, however, matched the corresponding picture with 100% accuracy.   Short Term Goals: Week 1: SLP Short Term Goal 1 (Week 1): Pt will verbalize 3-4 word utterances with efficient breath support with PMSV with Max A multimodal cueing SLP Short Term Goal 1 - Progress (Week 1): Met SLP Short Term Goal 2 (Week 1): Pt will follow 1 step commnads with Max mutlimodal cueing with 25% of opportunites  SLP Short Term Goal 2 - Progress (Week 1): Met SLP Short Term Goal 3 (Week 1): Pt will demonstrate sustained attention to a functional task for 1 minute with Max multimodal cueing SLP Short Term Goal 3 - Progress (Week 1): Met SLP Short Term Goal 4 (Week 1): Pt will increase orentation to person, place and situtation with Max mutlimodal cueing SLP Short Term Goal 4 - Progress (Week 1): Met SLP Short Term Goal 5 (Week 1): Pt will consume Dys. 1 textures and nectar thick liquids without overt s/s of aspiration with Mod A verbal cues for utilization of swallowing compensatory strategies.  SLP Short Term Goal 5 - Progress (Week 1): Met  New Short Term Goals: Week 2: SLP Short Term Goal 1 (Week 2): Pt will follow 2 step commands with 50% accuracy  with Mod verbal cues  SLP Short Term Goal 2 (Week 2): Pt will demonstrate selective attention in a midly distracting enviornemnt for ~10 minutes with Mod A verbal cues for redirection SLP Short Term Goal 3 (Week 2): Pt will demonstrate intellectual awareness of person, place, time and situation with Mod A multimodal cueing SLP Short Term Goal 4 (Week 2): Pt will consume Dys. 1 textures and nectar-thick liquids with Min A verbal cues for utilization of swallowing compensatory strategies.  SLP Short Term Goal 5 (Week 2): Pt will demonstrate functional problem solving for familiar tasks with Mod A multimodal cueing   Weekly Progress Updates: Pt has made functional gains and has met 5 out of 5 STG's this reporting period.  Currently, pt is demonstrating behaviors consistent with a Rancho Level V emerging VI and requires Total A for short-term memory and intellectual awareness and requires Max A multimodal cueing for orientation, problem solving and safety awareness. Pt is currently tolerating his PMSV during all waking hours but requires full supervision because he is cognitively and physically unable to donn/doff his PMSV. Pt administered MBSS and recommended Dys. 1 textures with nectar-thick liquids with SLP only. Pt would benefit from continued skilled SLP intervention to maximize cognitive function, swallowing safety with least restrictive diet and overall functional communication to maximize his functional independence.    SLP Frequency: 1-2 X/day, 30-60 minutes Estimated Length of Stay: 3 weeks SLP Treatment/Interventions: Cognitive remediation/compensation;Cueing hierarchy;Dysphagia/aspiration precaution training;Environmental controls;Functional tasks;Internal/external aids;Patient/family education;Therapeutic Activities;Speech/Language facilitation  Daily Session FIM:  Comprehension Comprehension Mode: Auditory Comprehension: 2-Understands basic 25 -  49% of the time/requires cueing 51 - 75% of  the time Expression Expression Mode: Verbal Expression: 2-Expresses basic 25 - 49% of the time/requires cueing 50 - 75% of the time. Uses single words/gestures. Social Interaction Social Interaction: 2-Interacts appropriately 25 - 49% of time - Needs frequent redirection. Problem Solving Problem Solving: 2-Solves basic 25 - 49% of the time - needs direction more than half the time to initiate, plan or complete simple activities Memory Memory: 1-Recognizes or recalls less than 25% of the time/requires cueing greater than 75% of the time FIM - Eating Eating Activity: 4: Help with managing cup/glass Pain In lower back, RN notified and pt repositioned   Therapy/Group: Individual Therapy  Mahathi Pokorney 08/19/2012, 11:56 AM

## 2012-08-20 ENCOUNTER — Inpatient Hospital Stay (HOSPITAL_COMMUNITY): Payer: Medicaid Other | Admitting: *Deleted

## 2012-08-20 ENCOUNTER — Inpatient Hospital Stay (HOSPITAL_COMMUNITY): Payer: Medicaid Other | Admitting: Physical Therapy

## 2012-08-20 ENCOUNTER — Inpatient Hospital Stay (HOSPITAL_COMMUNITY): Payer: Medicaid Other | Admitting: Speech Pathology

## 2012-08-20 ENCOUNTER — Inpatient Hospital Stay (HOSPITAL_COMMUNITY): Payer: Self-pay | Admitting: Occupational Therapy

## 2012-08-20 ENCOUNTER — Encounter (HOSPITAL_COMMUNITY): Payer: Self-pay | Admitting: Occupational Therapy

## 2012-08-20 DIAGNOSIS — Z430 Encounter for attention to tracheostomy: Secondary | ICD-10-CM

## 2012-08-20 DIAGNOSIS — J9600 Acute respiratory failure, unspecified whether with hypoxia or hypercapnia: Secondary | ICD-10-CM

## 2012-08-20 LAB — GLUCOSE, CAPILLARY
Glucose-Capillary: 131 mg/dL — ABNORMAL HIGH (ref 70–99)
Glucose-Capillary: 153 mg/dL — ABNORMAL HIGH (ref 70–99)
Glucose-Capillary: 156 mg/dL — ABNORMAL HIGH (ref 70–99)
Glucose-Capillary: 195 mg/dL — ABNORMAL HIGH (ref 70–99)

## 2012-08-20 LAB — PROTIME-INR
INR: 2.11 — ABNORMAL HIGH (ref 0.00–1.49)
Prothrombin Time: 22.8 seconds — ABNORMAL HIGH (ref 11.6–15.2)

## 2012-08-20 MED ORDER — PRO-STAT SUGAR FREE PO LIQD
30.0000 mL | Freq: Three times a day (TID) | ORAL | Status: DC
  Administered 2012-08-20 – 2012-08-22 (×8): 30 mL
  Filled 2012-08-20 (×13): qty 30

## 2012-08-20 MED ORDER — VITAL 1.5 CAL PO LIQD
355.0000 mL | Freq: Four times a day (QID) | ORAL | Status: DC
  Administered 2012-08-20 – 2012-08-21 (×4): 355 mL
  Filled 2012-08-20 (×18): qty 474

## 2012-08-20 MED ORDER — NEOMYCIN-POLYMYXIN-PRAMOXINE 1 % EX CREA
TOPICAL_CREAM | Freq: Two times a day (BID) | CUTANEOUS | Status: DC
  Administered 2012-08-20 – 2012-09-11 (×39): via TOPICAL
  Filled 2012-08-20 (×2): qty 28

## 2012-08-20 MED ORDER — WARFARIN SODIUM 6 MG PO TABS
6.0000 mg | ORAL_TABLET | Freq: Once | ORAL | Status: AC
  Filled 2012-08-20: qty 1

## 2012-08-20 MED ORDER — METHOCARBAMOL 500 MG PO TABS
500.0000 mg | ORAL_TABLET | Freq: Four times a day (QID) | ORAL | Status: DC | PRN
  Administered 2012-08-20 – 2012-09-04 (×8): 500 mg via ORAL
  Filled 2012-08-20 (×10): qty 1

## 2012-08-20 MED ORDER — VITAL 1.5 CAL PO LIQD: 355.0000 mL | ORAL | Status: DC

## 2012-08-20 MED ORDER — FREE WATER
330.0000 mL | Freq: Four times a day (QID) | Status: DC
  Administered 2012-08-20 – 2012-08-22 (×11): 330 mL
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
## 2012-08-20 NOTE — Progress Notes (Signed)
Occupational Therapy Session Note  Patient Details  Name: Alex Foster MRN: [id number] Date of Birth: 1945/10/12  Today's Date: 08/20/2012 Time: 1345-1440 Time Calculation (min): 55 min  Short Term Goals: Week 2:  OT Short Term Goal 1 (Week 2): Pt would don shirt with mod A  OT Short Term Goal 2 (Week 2): Pt would maintain sitting balance for 5 min with supervision during functional tasks OT Short Term Goal 3 (Week 2): Pt will be oriented to self, place and month with min environmental cues OT Short Term Goal 4 (Week 2): Pt would transfer with min A to toilet/BSC  Skilled Therapeutic Interventions/Progress Updates:    1:1 focus on orientation (oriented x3 - not time), functional propelling of w/c with feet, perform laundry task sit to stand with increased use of left UE as dominant with min A for active assist use, made pudding in kitchen with focus on use of left UE, working memory, selective attention in min distracting environment, simple problem solving and managing small bites and sips with mod cuing  Therapy Documentation Precautions:  Precautions Precautions: Fall Precaution Comments: L shoulder subluxation and L side hemiparesis. Peg tube, trach , right wrist fracture: in a short arm cast Restrictions Weight Bearing Restrictions: No General:   Vital Signs: Therapy Vitals Pulse Rate: 63  Resp: 16  Oxygen Therapy SpO2: 94 % O2 Device: None (Room air) Pain: Requested meds for back discomfort at end of session  See FIM for current functional status  Therapy/Group: Individual Therapy  [name] [facility] 08/20/2012, 3:52 PM
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
## 2012-08-20 NOTE — Progress Notes (Signed)
Speech Language Pathology Daily Session Note  Patient Details  Name: Alex Foster MRN: [id number] Date of Birth: 03-16-1946  Today's Date: 08/20/2012 Time: 1000-1055 Time Calculation (min): 55 min  Short Term Goals: Week 2: SLP Short Term Goal 1 (Week 2): Pt will follow 2 step commands with 50% accuracy with Mod verbal cues  SLP Short Term Goal 2 (Week 2): Pt will demonstrate selective attention in a midly distracting enviornemnt for ~10 minutes with Mod A verbal cues for redirection SLP Short Term Goal 3 (Week 2): Pt will demonstrate intellectual awareness of person, place, time and situation with Mod A multimodal cueing SLP Short Term Goal 4 (Week 2): Pt will consume Dys. 1 textures and nectar-thick liquids with Min A verbal cues for utilization of swallowing compensatory strategies.  SLP Short Term Goal 5 (Week 2): Pt will demonstrate functional problem solving for familiar tasks with Mod A multimodal cueing   Skilled Therapeutic Interventions: Treatment focus on dysphagia and cognitive goals. Pt consumed Dys. 1 textures and nectar-thick liquids without overt s/s of aspiration and required Mod verbal cues for utilization of small bites. Pt required Mod verbal cues to utilize LUE as a stabilizer for self-feeding. Pt oriented X 4 and required Mod verbal cues for recall of rules to a new learning task.    FIM:  Comprehension Comprehension Mode: Auditory Comprehension: 3-Understands basic 50 - 74% of the time/requires cueing 25 - 50%  of the time Expression Expression Mode: Verbal Expression: 4-Expresses basic 75 - 89% of the time/requires cueing 10 - 24% of the time. Needs helper to occlude trach/needs to repeat words. Social Interaction Social Interaction: 3-Interacts appropriately 50 - 74% of the time - May be physically or verbally inappropriate. Problem Solving Problem Solving: 2-Solves basic 25 - 49% of the time - needs direction more than half the time to initiate, plan or  complete simple activities Memory Memory: 2-Recognizes or recalls 25 - 49% of the time/requires cueing 51 - 75% of the time FIM - Eating Eating Activity: 4: Helper occasionally brings food to mouth  Pain Pain Assessment Pain Assessment: No/denies pain  Therapy/Group: Individual Therapy  Gianni Mihalik 08/20/2012, 3:44 PM

## 2012-08-20 NOTE — Progress Notes (Signed)
Pt continues to c/o pain this eve after prn given; pt describes spasm type discomfort; Harvel Ricks PAC aware, Robaxin given at 1840; pt reports effective. Monitor.  Tolerating bolus feeds, no residuals; see previous\ious note re: peg site. Alex Foster

## 2012-08-20 NOTE — Progress Notes (Signed)
Peg site red with large amount tan drainage; cleansed and redressed. Monitor. Carlean Purl
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
## 2012-08-20 NOTE — Progress Notes (Signed)
Physical Therapy Note  Patient Details  Name: Alex Foster MRN: [id number] Date of Birth: 1945-12-23 Today's Date: 08/20/2012  Time: 900-955 55 minutes Co-tx with TR  Pt c/o low back pain, rest and repositioned, RN made aware.  Stair training with B handrails, +2 assist for safety.  Pt required 50% lifting assistance up stairs, 65% down stairs.  Pt with noticeable eccentric weakness B LEs.  Standing coordination/balance activity with min - mod A for standing balance, pt able to remember sequences of up to 3 numbers without assist.  Pt limited by low frustration tolerance, perseveration on pain, required max encouragement to participate.  Gait training 2 x 45' with + 2 assist for safety.  Pt consistently unsafe with turning to sit in chair or w/c.  Pt resistive to advice on sitting more safely.  Pt limited throughout session by poor activity tolerance.  Individual therapy   [name] 08/20/2012, 9:55 AM

## 2012-08-20 NOTE — Progress Notes (Signed)
Bedside trach change.  # 6.0 XLT cuffed trach in place.  Airway exchange tube placed in old trach.  Removed old trach.  New #6.0 XLT cuff-less trach placed over tube exchanger.  Good cough, bilateral breath sounds, positive color change on end tidal CO2.  Noted fetid odor from trach and thick green / yellow secretions.    Plan: -SLP diet recommendations -monitor for fever / leukocytosis given trach secretions.  Consider course abx for tracheobronchitis.  -#6.0 cuff-less and cuffed trach to remain at bedside  Canary Brim, NP-C [facility] Pgr: [phone] or [phone]  First trach change.  Patient seen and examined, agree with above note.  I dictated the care and orders written for this patient under my direction.  Koren Bound, M.D. [phone]

## 2012-08-20 NOTE — Progress Notes (Signed)
Nutrition Note  Spoke with PA.  Will change TF regime to prevent night time bolus per G tube.  Pt remains NPO except with SLP.  Vital 1.5:  1 1/2 cans 4 times daily Prostat 30 ml tid Free Water:  330 ml 4 times daily  Schedule:  7 am, 12 noon, 5 pm, 10 pm  Above to provide:  2430 kcal, 141 gm protein, 2406 ml free water.  Please page if problems or concerns.  Oran Rein, RD, LDN Clinical Inpatient Dietitian Pager:  [phone] Weekend and after hours pager:  [phone]

## 2012-08-20 NOTE — Progress Notes (Signed)
Recreational Therapy Session Note  Patient Details  Name: Alex Foster MRN: [id number] Date of Birth: 1946-03-20 Today's Date: 08/20/2012 Time:  900-945  Pain: c/o lower back pain, RUE pain, LLE pain intermittently, pt premedicated, repositioning offered, RN made aware Skilled Therapeutic Interventions/Progress Updates: attempted to engage pt in multiple leisure activities with focus on dynamic standing balance, pt refused suggested activities.  Stair training with B handrails, +2 assist for safety. Pt required 50% lifting assistance up stairs, 65% down stairs. Standing coordination/balance activity with min - mod A for standing balance, pt able to remember sequences of up to 3 numbers without assist. Pt limited by low frustration tolerance, perseveration on pain, required max encouragement to participate. Gait training 2 x 45' with + 2 assist for safety. Pt consistently unsafe with turning to sit in chair or w/c. Pt resistive to advice on sitting more safely. Pt limited throughout session by poor activity tolerance.  Therapy/Group: Co-Treatment  Majorie Santee 08/20/2012, 1:45 PM

## 2012-08-20 NOTE — Progress Notes (Signed)
Physical Therapy Note  Patient Details  Name: Alex Foster MRN: [id number] Date of Birth: January 29, 1946 Today's Date: 08/20/2012  Time: 1105-1131 26 minutes  Pt continues to c/o low back pain, RN made aware.  W/c propulsion with B LEs with min A, distance limited today by back pain.  Nu step for activity tolerance and attention to task, pt able to perform 2 x 2 minutes with min cues to continue to pedal.  Pt requested to lie down to rest, agreeable to propel w/c to room if allowed to rest, improved reasoning noted during session.  Individual therapy   DONAWERTH,KAREN 08/20/2012, 12:15 PM

## 2012-08-21 ENCOUNTER — Inpatient Hospital Stay (HOSPITAL_COMMUNITY): Payer: Medicaid Other | Admitting: Speech Pathology

## 2012-08-21 ENCOUNTER — Inpatient Hospital Stay (HOSPITAL_COMMUNITY): Payer: Medicaid Other | Admitting: Physical Therapy

## 2012-08-21 ENCOUNTER — Inpatient Hospital Stay (HOSPITAL_COMMUNITY): Payer: Medicaid Other

## 2012-08-21 ENCOUNTER — Inpatient Hospital Stay (HOSPITAL_COMMUNITY): Payer: Medicaid Other | Admitting: *Deleted

## 2012-08-21 ENCOUNTER — Inpatient Hospital Stay (HOSPITAL_COMMUNITY): Payer: Medicaid Other | Admitting: Occupational Therapy

## 2012-08-21 DIAGNOSIS — S069X9A Unspecified intracranial injury with loss of consciousness of unspecified duration, initial encounter: Secondary | ICD-10-CM

## 2012-08-21 LAB — GLUCOSE, CAPILLARY
Glucose-Capillary: 184 mg/dL — ABNORMAL HIGH (ref 70–99)
Glucose-Capillary: 133 mg/dL — ABNORMAL HIGH (ref 70–99)
Glucose-Capillary: 180 mg/dL — ABNORMAL HIGH (ref 70–99)

## 2012-08-21 MED ORDER — WARFARIN SODIUM 6 MG PO TABS
6.0000 mg | ORAL_TABLET | Freq: Once | ORAL | Status: AC
  Administered 2012-08-21: 6 mg via ORAL
  Filled 2012-08-21: qty 1
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
## 2012-08-21 NOTE — Progress Notes (Signed)
Physical Therapy Note  Patient Details  Name: Alex Foster MRN: [id number] Date of Birth: Aug 20, 1946 Today's Date: 08/21/2012  1445- 1525 (40 minutes) individual Pain: "soreness " Rt wrist/premedicated Focus of treatment: gait training Treatment: Gait 55 feet X 4 with EVA walker mod assist + second person to guide AD + mod vcs to stay within AD.    Alex Foster,[name] 08/21/2012, 7:34 AM

## 2012-08-21 NOTE — Progress Notes (Signed)
Occupational Therapy Session Note  Patient Details  Name: Alex Foster MRN: [id number] Date of Birth: 05-24-1946  Today's Date: 08/21/2012 Time: 0930-1030 Time Calculation (min): 60 min  Short Term Goals: Week 1:  OT Short Term Goal 1 (Week 1): Pt will transfer to toilet with mod A with +2 for safety OT Short Term Goal 1 - Progress (Week 1): Met OT Short Term Goal 2 (Week 1): Pt will perform 2/3 grooming tasks with mod A OT Short Term Goal 2 - Progress (Week 1): Met OT Short Term Goal 3 (Week 1): Pt will demonstrate sustained attention for 2 min with a functional tasks OT Short Term Goal 3 - Progress (Week 1): Met OT Short Term Goal 4 (Week 1): Pt will be oriented to self and place with max cuing (with environmental cues) OT Short Term Goal 4 - Progress (Week 1): Met OT Short Term Goal 5 (Week 1): Pt will be able to sort from 2 objects with mod cuing  OT Short Term Goal 5 - Progress (Week 1): Met Week 2:  OT Short Term Goal 1 (Week 2): Pt would don shirt with mod A  OT Short Term Goal 2 (Week 2): Pt would maintain sitting balance for 5 min with supervision during functional tasks OT Short Term Goal 3 (Week 2): Pt will be oriented to self, place and month with min environmental cues OT Short Term Goal 4 (Week 2): Pt would transfer with min A to toilet/BSC  Skilled Therapeutic Interventions/Progress Updates:    1:1 self care retraining at shower level focus on transfers, sit to stands, standing balance, functional use of left UE with mod A (with support at elbow), attention, self awareness. Pt oriented x4. Pt with increased recall of daily events and had anticipated this therapist coming to assist with bathing and dressing. Pt can perform selective attention for 30 min during routine self care tasks.  Therapy Documentation Precautions:  Precautions Precautions: Fall Precaution Comments: L shoulder subluxation and L side hemiparesis. Peg tube, trach , right wrist fracture: in a short  arm cast Restrictions Weight Bearing Restrictions: No Pain: Pain Assessment Pain Assessment: 0-10 Pain Score:   4 Pain Type: Acute pain Pain Location: Back Pain Orientation: Mid;Lower Pain Onset: On-going Patients Stated Pain Goal: 3 Pain Intervention(s): Medication (See eMAR)  See FIM for current functional status  Therapy/Group: Individual Therapy  [name] [facility] 08/21/2012, 11:51 AM

## 2012-08-21 NOTE — Progress Notes (Signed)
Recreational Therapy Session Note  Patient Details  Name: Alex Foster MRN: [id number] Date of Birth: 03-Feb-1946 Today's Date: 08/21/2012 Time:  08-1153 Pain: c/o low back pain with activity, relieved with repositioning & rest Skilled Therapeutic Interventions/Progress Updates: W/c mobility on household surfaces with Min assist, min cues for encouragement.  Sit--stand from various seating surfaces with mod-max assist.  Ambulated with +2 assist on carpeted & tile surfaces in the [place].  Path-finding activity using signs with max cues.  Therapy/Group: Co-Treatment  Lavalle Skoda 08/21/2012, 4:19 PM

## 2012-08-21 NOTE — Progress Notes (Signed)
Speech Language Pathology Daily Session Note  Patient Details  Name: Alex Foster MRN: [id number] Date of Birth: [birth year]  Today's Date: 08/21/2012 Time: 0820-0900 Time Calculation (min): 40 min  Short Term Goals: Week 2: SLP Short Term Goal 1 (Week 2): Pt will follow 2 step commands with 50% accuracy with Mod verbal cues  SLP Short Term Goal 2 (Week 2): Pt will demonstrate selective attention in a midly distracting enviornemnt for ~10 minutes with Mod A verbal cues for redirection SLP Short Term Goal 3 (Week 2): Pt will demonstrate intellectual awareness of person, place, time and situation with Mod A multimodal cueing SLP Short Term Goal 4 (Week 2): Pt will consume Dys. 1 textures and nectar-thick liquids with Min A verbal cues for utilization of swallowing compensatory strategies.  SLP Short Term Goal 5 (Week 2): Pt will demonstrate functional problem solving for familiar tasks with Mod A multimodal cueing   Skilled Therapeutic Interventions: Treatment focus on cognitive and dysphagia goals. Pt required Max questioning cues to orient to place and time. PMSV donned and pt consumed Dys. 1 textures with nectar thick liquids without overt s/s of aspiration but required Max verbal cues to utilize small bites. Pt also required Min verbal and tactile cues to utilize his LUE as a stabilizer during a self-feeding task. Pt demonstrated selective attention in a mildly distracting environment for ~30 minutes with Mod A verbal cues for redirection due to perseverative on pain and pain medication.    FIM:  Comprehension Comprehension Mode: Auditory Comprehension: 3-Understands basic 50 - 74% of the time/requires cueing 25 - 50%  of the time Expression Expression Mode: Verbal Expression: 4-Expresses basic 75 - 89% of the time/requires cueing 10 - 24% of the time. Needs helper to occlude trach/needs to repeat words. Social Interaction Social Interaction: 3-Interacts appropriately 50 - 74% of the  time - May be physically or verbally inappropriate. Problem Solving Problem Solving: 2-Solves basic 25 - 49% of the time - needs direction more than half the time to initiate, plan or complete simple activities Memory Memory: 2-Recognizes or recalls 25 - 49% of the time/requires cueing 51 - 75% of the time FIM - Eating Eating Activity: 5: Needs verbal cues/supervision  Pain 10/10 in back and shoulder. Pt repositioned and RN notified   Therapy/Group: Individual Therapy  Jari Dipasquale 08/21/2012, 4:06 PM

## 2012-08-21 NOTE — Progress Notes (Signed)
Occupational Therapy Session Note  Patient Details  Name: Alex Foster MRN: [id number] Date of Birth: 03-15-1946  Today's Date: 08/21/2012 Time: 2956-2130 Time Calculation (min): 45 min  Short Term Goals: Week 1:  OT Short Term Goal 1 (Week 1): Pt will transfer to toilet with mod A with +2 for safety OT Short Term Goal 1 - Progress (Week 1): Met OT Short Term Goal 2 (Week 1): Pt will perform 2/3 grooming tasks with mod A OT Short Term Goal 2 - Progress (Week 1): Met OT Short Term Goal 3 (Week 1): Pt will demonstrate sustained attention for 2 min with a functional tasks OT Short Term Goal 3 - Progress (Week 1): Met OT Short Term Goal 4 (Week 1): Pt will be oriented to self and place with max cuing (with environmental cues) OT Short Term Goal 4 - Progress (Week 1): Met OT Short Term Goal 5 (Week 1): Pt will be able to sort from 2 objects with mod cuing  OT Short Term Goal 5 - Progress (Week 1): Met  Skilled Therapeutic Interventions/Progress Updates:    1:1 focusing on maneuvering around unit propelling w/c with feet with min A, drinking apple juice with constant cuing for every sip to take a small sip, folding clean laundry (obtaining it from dryer) with use of left UE with mod A as non-dominant hand and with mod A for left attention to body and environment (decreased awareness of  Neglect on left) . Performed shaving with total A - pt assisting with instructing therapist on process and instructions, selective attention in min distracting environment. (used a large mirror on table top to increase ability to see self)  Therapy Documentation Precautions:  Precautions Precautions: Fall Precaution Comments: L shoulder subluxation and L side hemiparesis. Peg tube, trach , right wrist fracture: in a short arm cast Restrictions Weight Bearing Restrictions: No Pain:  ongoing pain 9/10 in right wrist and in lower back- RN aware and preparing meds  See FIM for current functional  status  Therapy/Group: Individual Therapy  [name] [facility] 08/21/2012, 2:06 PM

## 2012-08-21 NOTE — Progress Notes (Signed)
Nutrition Follow-up  Intervention:   Continue to hold TF for intake >/=50% of meals.  Would continue HS feeding for now and continue Prostat through out stat due to increased needs.  Assessment:   Nurse reports that patient is eating well.  Ate 50% breakfast and 75% lunch.  Morning and noon bolus TF were held today.   Discussed need to continue HS bolus feeding until intake increased further.  Diet Order:  Dysphagia 1 with Nectar thick liquids.   TF:  Vital 1.5 per PEG 1/2 cans 4 times daily per nurse to hold if intake of meals is >/=50%.   Prostat 30 ml tid Free water:  330 ml 4 times daily  Meds: Scheduled Meds:   . amantadine  100 mg Per Tube BID  . antiseptic oral rinse  15 mL Mouth Rinse QID  . chlorhexidine  15 mL Mouth Rinse BID  . citalopram  10 mg Per Tube Daily  . clonazePAM  0.5 mg Oral QHS  . feeding supplement  30 mL Per Tube TID WC  . feeding supplement (VITAL 1.5 CAL)  355 mL Per Tube QID  . free water  330 mL Per Tube QID  . glycopyrrolate  1 mg Per Tube BID  . guaifenesin  300 mg Per Tube Q6H  . insulin aspart  0-5 Units Subcutaneous QHS  . insulin aspart  0-9 Units Subcutaneous TID WC  . metFORMIN  500 mg Oral BID WC  . neomycin-polymyxin-pramoxine   Topical BID  . pantoprazole sodium  40 mg Per Tube Q1200  . propranolol  30 mg Per Tube Q12H  . QUEtiapine  50 mg Oral QHS  . Tamsulosin HCl  0.8 mg Oral QHS  . warfarin  6 mg Oral ONCE-1800  . warfarin  6 mg Oral ONCE-1800  . Warfarin - Pharmacist Dosing Inpatient   Does not apply q1800   Continuous Infusions:  PRN Meds:.acetaminophen (TYLENOL) oral liquid 160 mg/5 mL, acetaminophen, albuterol, bisacodyl, guaifenesin, ipratropium, LORazepam, methocarbamol, ondansetron (ZOFRAN) IV, ondansetron, oxyCODONE, senna-docusate, sorbitol, traZODone   CMP     Component Value Date/Time   NA 136 08/09/2012 0505   K 4.3 08/09/2012 0505   CL 100 08/09/2012 0505   CO2 31 08/09/2012 0505   GLUCOSE 107* 08/09/2012 0505   BUN  25* 08/09/2012 0505   CREATININE 0.82 08/09/2012 0505   CREATININE 1.15 03/13/2012 1043   CALCIUM 8.9 08/09/2012 0505   PROT 6.8 08/09/2012 0505   ALBUMIN 2.7* 08/09/2012 0505   AST 18 08/09/2012 0505   ALT 10 08/09/2012 0505   ALKPHOS 151* 08/09/2012 0505   BILITOT 0.5 08/09/2012 0505   GFRNONAA >90 08/09/2012 0505   GFRAA >90 08/09/2012 0505    CBG (last 3)   Basename 08/21/12 0750 08/21/12 0437 08/20/12 2350  GLUCAP 285* 184* 153*     Intake/Output Summary (Last 24 hours) at 08/21/12 1417 Last data filed at 08/21/12 1307  Gross per 24 hour  Intake    240 ml  Output    250 ml  Net    -10 ml    Weight Status:  93.9 kg (11/15)  Re-estimated needs:  2340-2630 kcal, 140-160 gm protein  Nutrition Dx:  Inadequate oral intake r/t dysphagia/ brain injury AEB reported intake of 50-75% of meals and need for PEG for continued nutrition therapy.  New Dx:  Increased nutrient needs related to brain injury and size AEB estimated needs.  Goal:  Tolerate diet with intake of meals and supplements per PEG  to meet 100% estimated needs.  Monitor:  Intake, TF, labs, weight   Oran Rein, RD, LDN Clinical Inpatient Dietitian Pager:  [phone] Weekend and after hours pager:  [phone]

## 2012-08-21 NOTE — Patient Care Conference (Signed)
Inpatient RehabilitationTeam Conference Note Date: 08/20/2012   Time: 3:10 PM    Patient Name: Alex Foster      Medical Record Number: [id number]  Date of Birth: [birth year] Sex: Male         Room/Bed: 4002/4002-01 Payor Info: Payor: MEDICAID [place]  Plan: MEDICAID [place] ACCESS  Product Type: *No Product type*     Admitting Diagnosis: TRAUMA TBI AND CVA  Admit Date/Time:  08/08/2012  3:19 PM Admission Comments: No comment available   Primary Diagnosis:  Trauma Principal Problem: Trauma  Patient Active Problem List   Diagnosis Date Noted  . Trauma 08/09/2012  . DM (diabetes mellitus) 08/03/2012  . DVT of axillary vein, acute right 08/03/2012  . Anticoagulation goal of INR 2 to 3 08/03/2012  . Hypertension   . Myocardial infarction   . Pleural effusion 06/09/2012  . Mobitz (type) II atrioventricular block 06/04/2012  . Acute respiratory failure with hypoxia 06/02/2012  . Cardiac arrest 06/02/2012  . Cardiogenic shock 06/02/2012  . Tracheobronchitis, MRSA 06/02/2012  . Leukocytosis 06/01/2012  . Lactic acidosis 06/01/2012  . Multiple rib fractures 05/31/2012  . 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 Extensive facial fractures 05/31/2012  . Leg erythema 05/11/2012  . Health care maintenance 02/29/2012  . Dizziness 07/18/2011  . Hepatic encephalopathy 04/12/2011  . Low back pain 03/29/2011  . Physical deconditioning 03/28/2011  . Thrombocytopenia 03/24/2011  . Anasarca 03/24/2011  . Knee pain, left 02/21/2011  . Constipation 11/22/2010  . OTHER DISORDERS OF BONE AND CARTILAGE OTHER 08/17/2010  . RBBB 02/03/2010  . INSOMNIA, CHRONIC 12/16/2009  . BENIGN POSITIONAL VERTIGO 08/10/2008  . HYPERLIPIDEMIA, CONTROLLED 12/04/2007  . DIABETES MELLITUS, TYPE II, WITHOUT COMPLICATIONS 08/23/2007  . TOBACCO ABUSE 05/08/2007  . HYPERTENSION, BENIGN ESSENTIAL, CONTROLLED 05/08/2007  . COPD 05/08/2007    Expected Discharge Date: Expected Discharge Date:  (SNF)  Team Members  Present: Physician: Dr. Faith Rogue Social Worker Present: Amada Jupiter, LCSW Nurse Present: Carlean Purl, RN PT Present: Reggy Eye, PT OT Present: [name], OT SLP Present: Feliberto Gottron, SLP Other (Discipline and Name): Tora Duck, PPS Coordinator and Bayard Hugger, RN     Current Status/Progress Goal Weekly Team Focus  Medical   improving resptiratory status, less agitation, cognition slowly improving  increase respiratory capacity, increaes po intake  downsize trach   Bowel/Bladder   Continent  days, anticipating and verbalizing needs; incontinent at times at [facility]; CC at times; Continent stool,formed.  continent B/B  timed toileting;    Swallow/Nutrition/ Hydration   Will initiate Dys. 1 textures with nectar thick liquids, Mod A  Min A with least restrictive diet  utilziation of small bites and slow rate   ADL's   transfers minA, toileting max to total , bathing and dressing max A  overall mod A min A transfers  activity, orientation, attention, awareness, functional use of left UE   Mobility   min-mod A  min A w/c, mod A standing/gait  balance, awareness, activity tolerance   Communication   Min A with PMSV  Min A   expression of wants/needs with use of call bell   Safety/Cognition/ Behavioral Observations  Mod-Max A  min A  attention, awareness, problem solving   Pain   Oxy. IR 10mg  effective for c/o back pain. ( pt.. states H/O)  managed 2/10  monitor; give at [facility] as well as x 1 during night so pain more manageable in AM.   Skin   peg site red, tender, large amount dark  brown,blood tinged drainage; scattered bruising resolving  no new breakdown, no s/s infection  turn and reposition, neosporin to peg      *See Interdisciplinary Assessment and Plan and progress notes for long and short-term goals  Barriers to Discharge: profound cognitive deficits, behavior    Possible Resolutions to Barriers:  CBT, CPT    Discharge Planning/Teaching Needs:  SNF  remains the d/c plan      Team Discussion:  Making excellent progress this week and hope to have trach downsized by end of week. Continue to work on diet progression but needs strict supervision from staff.  Vision appears to be improving as well.  Revisions to Treatment Plan:  None   Continued Need for Acute Rehabilitation Level of Care: The patient requires daily medical management by a physician with specialized training in physical medicine and rehabilitation for the following conditions: Daily direction of a multidisciplinary physical rehabilitation program to ensure safe treatment while eliciting the highest outcome that is of practical value to the patient.: Yes Daily medical management of patient stability for increased activity during participation in an intensive rehabilitation regime.: Yes Daily analysis of laboratory values and/or radiology reports with any subsequent need for medication adjustment of medical intervention for : Post surgical problems;Neurological problems;Other;Pulmonary problems  Alex Foster 08/21/2012, 9:54 AM

## 2012-08-21 NOTE — Progress Notes (Signed)
Physical Therapy Note  Patient Details  Name: Juliocesar Nauta MRN: [id number] Date of Birth: 08-27-1946 Today's Date: 08/21/2012  Time: 1100-1154 54 minutes Co-tx with TR  Pt c/o low back pain with activity, eased with rest/repositioning.  W/c mobility controlled and household environments with min A.  Pt's L LE tends to move slowly, discoordinated requiring cues to advance.  Gait training to a variety of different seating surfaces with +2 assist, pt requiring less assist for wt shifting today, better able to advance L LE but continues to drag L LE when fatigued.  Pt continues with decreased attention/awareness of L UE and LE during functional mobility tasks.  Pt performed sit to stand from different surfaces with mod-max A depending on height of surface.  Path finding to room with total cuing to read signs, pt able to read and follow signs with min-mod A.  Individual therapy   Amil Bouwman 08/21/2012, 1:11 PM

## 2012-08-22 ENCOUNTER — Inpatient Hospital Stay (HOSPITAL_COMMUNITY): Payer: Self-pay | Admitting: Occupational Therapy

## 2012-08-22 ENCOUNTER — Inpatient Hospital Stay (HOSPITAL_COMMUNITY): Payer: Medicaid Other | Admitting: Physical Therapy

## 2012-08-22 ENCOUNTER — Encounter (HOSPITAL_COMMUNITY): Payer: Self-pay | Admitting: Occupational Therapy

## 2012-08-22 ENCOUNTER — Inpatient Hospital Stay (HOSPITAL_COMMUNITY): Payer: Medicaid Other

## 2012-08-22 ENCOUNTER — Inpatient Hospital Stay (HOSPITAL_COMMUNITY): Payer: Medicaid Other | Admitting: Speech Pathology

## 2012-08-22 LAB — GLUCOSE, CAPILLARY
Glucose-Capillary: 196 mg/dL — ABNORMAL HIGH (ref 70–99)
Glucose-Capillary: 127 mg/dL — ABNORMAL HIGH (ref 70–99)

## 2012-08-22 LAB — PROTIME-INR: INR: 1.87 — ABNORMAL HIGH (ref 0.00–1.49)

## 2012-08-22 MED ORDER — WARFARIN SODIUM 7.5 MG PO TABS
7.5000 mg | ORAL_TABLET | Freq: Once | ORAL | Status: AC
  Administered 2012-08-22: 7.5 mg via ORAL
  Filled 2012-08-22: qty 1

## 2012-08-22 NOTE — Progress Notes (Signed)
Pt sitting in wheelchair at nursing station.  Pt c/o of pain.  PRN pain medication given.  No further needs at this time.  Alex Foster

## 2012-08-22 NOTE — Progress Notes (Signed)
Occupational Therapy Session Note  Patient Details  Name: Alex Foster MRN: [id number] Date of Birth: 1946-01-20  Today's Date: 08/22/2012 Time: 1330-1400 Time Calculation (min): 30 min  Short Term Goals: Week 2:  OT Short Term Goal 1 (Week 2): Pt would don shirt with mod A  OT Short Term Goal 2 (Week 2): Pt would maintain sitting balance for 5 min with supervision during functional tasks OT Short Term Goal 3 (Week 2): Pt will be oriented to self, place and month with min environmental cues OT Short Term Goal 4 (Week 2): Pt would transfer with min A to toilet/BSC  Skilled Therapeutic Interventions/Progress Updates:    1:1 focus on functional use of left UE: AROM :shoulder flexion, elbow flexion and extension, grasp and release with functional reach for items to pick items up and then put them in container sitting EOB. Performed shoulder flexion, extension abduction and adduction with UE support on therapy ball working on active movement and controlled movement. Focus on normal patterns of movement in UE and in trunk. Pt needed min A for support at elbow.   Therapy Documentation Precautions:  Precautions Precautions: Fall Precaution Comments: L shoulder subluxation and L side hemiparesis. Peg tube, trach , right wrist fracture: in a short arm cast Restrictions Weight Bearing Restrictions: No Pain: Pain Assessment Pain Assessment: 0-10 Pain Score:   8 Pain Type: Acute pain Pain Location: Arm Pain Orientation: Left Pain Descriptors: Aching Pain Frequency: Constant Pain Onset: On-going Patients Stated Pain Goal: 2 Pain Intervention(s): Medication (See eMAR) (oxycodone 10 mgpo)  See FIM for current functional status  Therapy/Group: Individual Therapy  [name] [facility] 08/22/2012, 3:27 PM

## 2012-08-22 NOTE — Progress Notes (Signed)
RT unable to cork trach due to there not being a cork/cap for trach. RT is attempting to order a capping device in the morning 08/23/2012. RT will attempt to cap trach then, once device is in.

## 2012-08-22 NOTE — Progress Notes (Signed)
Pt was standing beside wheelchair to use the urinal.  Pt lost his balance while standing and was assisted to the floor.  Pt denies any pain. Did not fall on any limbs.  BP 152/72 HR62 O298% R18. Pt says he is "just fine".  Safety portal done.  Marissa Nestle, PA notified.  Will continue to monitor.  Harlan Stains [place]

## 2012-08-22 NOTE — Progress Notes (Signed)
Speech Language Pathology Daily Session Note  Patient Details  Name: Alex Foster MRN: [id number] Date of Birth: 05/17/1946  Today's Date: 08/22/2012 Time: 1130-1230 Time Calculation (min): 60 min  Short Term Goals: Week 2: SLP Short Term Goal 1 (Week 2): Pt will follow 2 step commands with 50% accuracy with Mod verbal cues  SLP Short Term Goal 2 (Week 2): Pt will demonstrate selective attention in a midly distracting enviornemnt for ~10 minutes with Mod A verbal cues for redirection SLP Short Term Goal 3 (Week 2): Pt will demonstrate intellectual awareness of person, place, time and situation with Mod A multimodal cueing SLP Short Term Goal 4 (Week 2): Pt will consume Dys. 1 textures and nectar-thick liquids with Min A verbal cues for utilization of swallowing compensatory strategies.  SLP Short Term Goal 5 (Week 2): Pt will demonstrate functional problem solving for familiar tasks with Mod A multimodal cueing   Skilled Therapeutic Interventions: Treatment focus on cognitive and dysphagia goals. Pt required supervision questioning cues to orient to place and time and demonstrated recall of functional conversation with Mod I. PMSV donned and pt consumed Dys. 1 textures with nectar thick liquids without overt s/s of aspiration but required Mod verbal cues to utilize small bites. Pt also required Max verbal and tactile cues to utilize his LUE for self-feeding task. Pt demonstrated selective attention in a mildly distracting environment for ~30 minutes with Min A verbal cues for redirection.    FIM:  Comprehension Comprehension Mode: Auditory Comprehension: 5-Understands basic 90% of the time/requires cueing < 10% of the time Expression Expression Mode: Verbal Expression: 5-Expresses complex 90% of the time/cues < 10% of the time Social Interaction Social Interaction: 6-Interacts appropriately with others with medication or extra time (anti-anxiety, antidepressant). Problem  Solving Problem Solving: 5-Solves basic problems: With no assist Memory Memory: 4-Recognizes or recalls 75 - 89% of the time/requires cueing 10 - 24% of the time FIM - Eating Eating Activity: 4: Help with managing cup/glass  Pain Pain Assessment Pain Assessment: No/denies pain  Therapy/Group: Individual Therapy  Jamaira Sherk 08/22/2012, 4:52 PM

## 2012-08-22 NOTE — Progress Notes (Signed)
Occupational Therapy Note  Patient Details  Name: Alex Foster MRN: [id number] Date of Birth: 1946-02-27 Today's Date: 08/22/2012  Time In:  1400  Time Out:  1430  Individual session.  Patient states "my arm hurts but not much- I broke it."  Completed formal orientation scale (GOAT score = 43) however feel score is impacted by patient's day to day memory.  Patient with significant visual changes that he reports are new since the accident.  Patient is able to read basic information if written in large contrast letters with limited information on white space. Patient will benefit from modified simplified memory notebook at this point. Patient with growing insight into his deficits and can state memory, mobility and balance as problems.  Patient continues to make excellent gains.   Norton Pastel 08/22/2012, 2:31 PM

## 2012-08-22 NOTE — Progress Notes (Signed)
Dr. Sherlean Foot orthopedic services and his PA Altamese Cabal contacted in regards to latest followup x-rays of right wrist. Films have been reviewed by orthopedic services at this time recommend no surgical intervention and to reapply a new short arm cast. No further recommendations at this time

## 2012-08-22 NOTE — Progress Notes (Signed)
ANTICOAGULATION CONSULT NOTE - Follow Up Consult  Pharmacy Consult:  Coumadin Indication:  RUE DVT  Allergies  Allergen Reactions  . Penicillins    Patient Measurements: Weight: 207 lb 0.2 oz (93.9 kg)  Vital Signs: Temp: 98.4 F (36.9 C) (11/21 0654) Temp src: Oral (11/21 0654) BP: 155/88 mmHg (11/21 0654) Pulse Rate: 60  (11/21 0654)  Labs:  Basename 08/22/12 0608 08/21/12 0600 08/20/12 1047  HGB -- -- --  HCT -- -- --  PLT -- -- --  APTT -- -- --  LABPROT 20.8* 24.9* 22.8*  INR 1.87* 2.38* 2.11*  HEPARINUNFRC -- -- --  CREATININE -- -- --  CKTOTAL -- -- --  CKMB -- -- --  TROPONINI -- -- --   Assessment: 57 YOM admitted to rehab s/p TBI / multi-trauma with hemorrhagic contusion to the head.  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  He developed and upper extremity DVT and is now continued on Coumadin while he is being cared for in the rehab unit.  INR dropped this morning - dose charted as given yesterday but not on the day before which likely accounts for the decrease.  He has no noted bleeding complications and last CBC on the 18th was stable.      Goal of Therapy:  INR 2 - 3 (aim for 2 - 2.5 d/t recent TBI with SAH) Monitor platelets by anticoagulation protocol: Yes   Plan:  - Coumadin 7.5 mg PO today - Daily PT / INR   Nadara Mustard, PharmD., MS Clinical Pharmacist Pager:  [phone] Thank you for allowing pharmacy to be part of this patients care team. 08/22/2012, 9:04 AM

## 2012-08-22 NOTE — Progress Notes (Signed)
Orthopedic Tech Progress Note Patient Details:  Alex Foster Jan 01, 1946 [id number]  Casting Type of Cast: Short arm cast Cast Location: (R) UE Cast Material: Fiberglass Cast Intervention: Application;Removal     [name] 08/22/2012, 8:03 PM

## 2012-08-22 NOTE — Progress Notes (Signed)
Occupational Therapy Note  Patient Details  Name: Alex Foster MRN: [id number] Date of Birth: [birth year] Today's Date: 08/22/2012  Time: 1000-1030 Pt denies pain Individual Therapy Pt engaged with LUE use for FM activities and manipulation tasks.  Pt practiced bringing feeding utensil from table to mouth with support at elbow.  Pt completed closed chain tasks with less difficulty than open chain tasks.  Pt exhibited increased "shoulder hike" and compensatory movements when attempting tasks requiring increased shoulder flexion.   Lavone Neri [facility] 08/22/2012, 4:01 PM

## 2012-08-22 NOTE — Progress Notes (Signed)
Physical Therapy Note  Patient Details  Name: Alex Foster MRN: [id number] Date of Birth: 1946/02/16 Today's Date: 08/22/2012  Time: 845-940 55 minutes  Pt c/o low back pain with mobility, eases with rest.  Gait training with +1 assist, HHA on L side, pt able to gait 3 x 30' with mod A. Pt continues to require manual facilitation for wt shifts, grading movements, occasional cues to advance L LE when fatigued.  Stair training multiple attempts for increasing eccentric control B LEs.  Pt continues to rely heavily on L UE for control when descending stairs.  Sit to stands without UE support multiple reps from various height surfaces with supervision - min A.  Supine therex for B LE and hip control/strengthening.  Pt able to perform bridges, bridge with adductor squeeze, SLR, LTR.  W/c mobility on unit with min A due to L LE weakness/incoordination making steering difficult.  Pt with much improved activity tolerance, reasoning and problem solving noted during session.  Individual therapy   DONAWERTH,KAREN 08/22/2012, 9:40 AM

## 2012-08-22 NOTE — Progress Notes (Signed)
Occupational Therapy Session Note  Patient Details  Name: Alex Foster MRN: [id number] Date of Birth: [birth year]  Today's Date: 08/22/2012 Time: 0730-0830 Time Calculation (min): 60 min  Short Term Goals: Week 2:  OT Short Term Goal 1 (Week 2): Pt would don shirt with mod A  OT Short Term Goal 2 (Week 2): Pt would maintain sitting balance for 5 min with supervision during functional tasks OT Short Term Goal 3 (Week 2): Pt will be oriented to self, place and month with min environmental cues OT Short Term Goal 4 (Week 2): Pt would transfer with min A to toilet/BSC  Skilled Therapeutic Interventions/Progress Updates:    1:1 self care retraining at shower level. Pt oriented x4 and demonstrated increased memory of staff, events from last night and yesterday. Pt with increased ability to direct his care and needs; requesting a shower and a reminder for therapist to cover cast. Noted pt with worsening image of right forearm fractures; discussed with pt avoid using hand and weight bearing through arm- pt able to hold on to this information throughout session. Pt still requires mod A for full functional use of left UE - requiring increased assistance with all tasks. Pt max difficulty with self feeding at breakfast- requiring total A towards end of meal due to frustration and fatigue of left UE  Therapy Documentation Precautions:  Precautions Precautions: Fall Precaution Comments: L shoulder subluxation and L side hemiparesis. Peg tube, trach , right wrist fracture: in a short arm cast Restrictions Weight Bearing Restrictions: No  Pain: Pain Assessment Pain Assessment: 0-10 Pain Score:   4 Pain Location: Arm Pain Orientation: Right Pain Descriptors: Aching Pain Frequency: Constant  See FIM for current functional status  Therapy/Group: Individual Therapy  [name] [facility] 08/22/2012, 9:14 AM
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
## 2012-08-22 NOTE — Progress Notes (Signed)
Patient due to have plug placed in trach . Plug not indicated per RT with extra long trach . o2 sat at 96% on room air . P.Love PA notified . Continue with plan of care .            Alex Foster

## 2012-08-23 ENCOUNTER — Inpatient Hospital Stay (HOSPITAL_COMMUNITY): Payer: Medicaid Other | Admitting: Speech Pathology

## 2012-08-23 ENCOUNTER — Inpatient Hospital Stay (HOSPITAL_COMMUNITY): Payer: Medicaid Other | Admitting: Physical Therapy

## 2012-08-23 ENCOUNTER — Encounter (HOSPITAL_COMMUNITY): Payer: Self-pay | Admitting: Occupational Therapy

## 2012-08-23 ENCOUNTER — Inpatient Hospital Stay (HOSPITAL_COMMUNITY): Payer: Self-pay | Admitting: Occupational Therapy

## 2012-08-23 DIAGNOSIS — S069X9A Unspecified intracranial injury with loss of consciousness of unspecified duration, initial encounter: Secondary | ICD-10-CM

## 2012-08-23 LAB — GLUCOSE, CAPILLARY
Glucose-Capillary: 253 mg/dL — ABNORMAL HIGH (ref 70–99)
Glucose-Capillary: 127 mg/dL — ABNORMAL HIGH (ref 70–99)
Glucose-Capillary: 164 mg/dL — ABNORMAL HIGH (ref 70–99)

## 2012-08-23 LAB — PROTIME-INR
INR: 1.93 — ABNORMAL HIGH (ref 0.00–1.49)
Prothrombin Time: 21.3 seconds — ABNORMAL HIGH (ref 11.6–15.2)

## 2012-08-23 MED ORDER — WARFARIN SODIUM 7.5 MG PO TABS
7.5000 mg | ORAL_TABLET | Freq: Once | ORAL | Status: AC
  Administered 2012-08-23: 7.5 mg via ORAL
  Filled 2012-08-23: qty 1

## 2012-08-23 MED ORDER — FREE WATER
100.0000 mL | Freq: Two times a day (BID) | Status: DC
  Administered 2012-08-23 – 2012-09-01 (×19): 100 mL

## 2012-08-23 NOTE — Progress Notes (Signed)
Speech Language Pathology Daily Session Note  Patient Details  Name: Alex Foster MRN: [id number] Date of Birth: 06-28-1946  Today's Date: 08/23/2012 Time: 1100-1130 Time Calculation (min): 30 min  Short Term Goals: Week 2: SLP Short Term Goal 1 (Week 2): Pt will follow 2 step commands with 50% accuracy with Mod verbal cues  SLP Short Term Goal 2 (Week 2): Pt will demonstrate selective attention in a midly distracting enviornemnt for ~10 minutes with Mod A verbal cues for redirection SLP Short Term Goal 3 (Week 2): Pt will demonstrate intellectual awareness of person, place, time and situation with Mod A multimodal cueing SLP Short Term Goal 4 (Week 2): Pt will consume Dys. 1 textures and nectar-thick liquids with Min A verbal cues for utilization of swallowing compensatory strategies.  SLP Short Term Goal 5 (Week 2): Pt will demonstrate functional problem solving for familiar tasks with Mod A multimodal cueing   Skilled Therapeutic Interventions: Treatment focus on dysphagia goals. Pt consumed trails of Dys. 2 and Dys. 3 textures and demonstrated efficient mastication with minimal oral residue. However, pt demonstrated delayed cough X 1 and required Mod verbal cues for utilization of small bites. Recommend continue current diet with trials of Dys. 2 textures.    FIM:  Comprehension Comprehension: 5-Understands basic 90% of the time/requires cueing < 10% of the time Expression Expression: 5-Expresses basic 90% of the time/requires cueing < 10% of the time. Social Interaction Social Interaction: 5-Interacts appropriately 90% of the time - Needs monitoring or encouragement for participation or interaction. Problem Solving Problem Solving: 4-Solves basic 75 - 89% of the time/requires cueing 10 - 24% of the time Memory Memory: 4-Recognizes or recalls 75 - 89% of the time/requires cueing 10 - 24% of the time FIM - Eating Eating Activity: 5: Set-up assist for open containers  Pain In  Right Arm, pt repositioned and premedicated   Therapy/Group: Individual Therapy  Maddelynn Moosman 08/23/2012, 11:50 AM

## 2012-08-23 NOTE — Progress Notes (Signed)
Physical Therapy Session Note  Patient Details  Name: Alex Foster MRN: [id number] Date of Birth: [birth year]  Today's Date: 08/23/2012 Time: 4098-1191 Time Calculation (min): 31 min  Short Term Goals: Week 3:     Skilled Therapeutic Interventions/Progress Updates:    Pt performed sit-stand with varying assist from w/c.  Initially mod-max but progressed to min assist with multiple attempts and verbal cues.  Still continues to lean posteriorly with LE's blocked against W/C for support.  Attempted ambulation but pt extremely unsteady today.  Mod-max assist to ambulate 10' with L HHA.  Max cues for safety and to turn completely to chair before attempting to sit.  Pt c/o R LE weakness/buckling.  Seated LAQ and Hip flexion x 10 B LE's. Propelled w/c x 100' with mod assist and B LE's.  Therapy Documentation Precautions:  Precautions Precautions: Fall Precaution Comments: L shoulder subluxation and L side hemiparesis. Peg tube, trach , right wrist fracture: in a short arm cast Restrictions Weight Bearing Restrictions: No Pain: Pain Assessment Pain Assessment: No/denies pain   See FIM for current functional status  Therapy/Group: Individual Therapy  Newell Coral 08/23/2012, 12:30 PM

## 2012-08-23 NOTE — Progress Notes (Signed)
Social Work Patient ID: [name], male   DOB: 1946/03/03, 66 y.o.   MRN: [id number]  Have spoken with patient and brother Duffy Rhody) this week to review team conference and d/c planning progress.  Brothers plan to be up tomorrow to visit with patient and see him in PT session.  All continue to be very pleased with progress pt is making and with plan for transition to [facility] area SNF as d/c plan.  Have sent current medical/ therapy notes to facilities in [place] who had expressed interest in pt while on acute.  Hopeful that we can secure a bed within the next week if possible.  Pt is agreeable with this plan of SNF in [place].  Kyel Purk
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
## 2012-08-23 NOTE — Progress Notes (Signed)
Patient ID: Alex Foster, male   DOB: [birth year], 66 y.o.   MRN: [id number]  Subjective/Complaints:  Up alert. Very lucid and appropriate. Not plugged because no cap for XLT  Review of Systems  Unable to perform ROS: mental acuity   Objective: Vital Signs: Blood pressure 172/76, pulse 66, temperature 98.2 F (36.8 C), temperature source Oral, resp. rate 18, weight 93.9 kg (207 lb 0.2 oz), SpO2 93.00%. Dg Wrist 2 Views Right  08/21/2012  *RADIOLOGY REPORT*  Clinical Data: Post injury follow-up.  RIGHT WRIST - 2 VIEW  Comparison: 08/12/2012.  Findings: Overlying cast obscures fine osseous and soft tissue detail.  Comminuted spiral type fracture of the distal right ulna.  When compared to the prior examination, there has been interval increased separation and further angulation of fracture fragments.  Remote fracture right first metacarpal.  IMPRESSION: Comminuted spiral type fracture of the distal right ulna.  When compared to the prior examination, there has been interval increased separation and further angulation of fracture fragments.  This is a call report.   Original Report Authenticated By: Lacy Duverney, M.D.    Results for orders placed during the hospital encounter of 08/08/12 (from the past 72 hour(s))  GLUCOSE, CAPILLARY     Status: Abnormal   Collection Time   08/20/12  7:29 AM      Component Value Range Comment   Glucose-Capillary 154 (*) 70 - 99 mg/dL    Comment 1 Notify RN     PROTIME-INR     Status: Abnormal   Collection Time   08/20/12 10:47 AM      Component Value Range Comment   Prothrombin Time 22.8 (*) 11.6 - 15.2 seconds    INR 2.11 (*) 0.00 - 1.49   GLUCOSE, CAPILLARY     Status: Abnormal   Collection Time   08/20/12 12:52 PM      Component Value Range Comment   Glucose-Capillary 243 (*) 70 - 99 mg/dL   GLUCOSE, CAPILLARY     Status: Abnormal   Collection Time   08/20/12  4:25 PM      Component Value Range Comment   Glucose-Capillary 156 (*) 70 - 99 mg/dL     GLUCOSE, CAPILLARY     Status: Abnormal   Collection Time   08/20/12  8:55 PM      Component Value Range Comment   Glucose-Capillary 195 (*) 70 - 99 mg/dL    Comment 1 Notify RN     GLUCOSE, CAPILLARY     Status: Abnormal   Collection Time   08/20/12 11:50 PM      Component Value Range Comment   Glucose-Capillary 153 (*) 70 - 99 mg/dL    Comment 1 Notify RN     GLUCOSE, CAPILLARY     Status: Abnormal   Collection Time   08/21/12  4:37 AM      Component Value Range Comment   Glucose-Capillary 184 (*) 70 - 99 mg/dL    Comment 1 Notify RN     PROTIME-INR     Status: Abnormal   Collection Time   08/21/12  6:00 AM      Component Value Range Comment   Prothrombin Time 24.9 (*) 11.6 - 15.2 seconds    INR 2.38 (*) 0.00 - 1.49   GLUCOSE, CAPILLARY     Status: Abnormal   Collection Time   08/21/12  7:50 AM      Component Value Range Comment   Glucose-Capillary 285 (*) 70 - 99 mg/dL  Comment 1 Notify RN     GLUCOSE, CAPILLARY     Status: Abnormal   Collection Time   08/21/12 11:44 AM      Component Value Range Comment   Glucose-Capillary 131 (*) 70 - 99 mg/dL    Comment 1 Notify RN     GLUCOSE, CAPILLARY     Status: Abnormal   Collection Time   08/21/12  4:07 PM      Component Value Range Comment   Glucose-Capillary 133 (*) 70 - 99 mg/dL    Comment 1 Notify RN     GLUCOSE, CAPILLARY     Status: Abnormal   Collection Time   08/21/12  8:02 PM      Component Value Range Comment   Glucose-Capillary 180 (*) 70 - 99 mg/dL    Comment 1 Notify RN     PROTIME-INR     Status: Abnormal   Collection Time   08/22/12  6:08 AM      Component Value Range Comment   Prothrombin Time 20.8 (*) 11.6 - 15.2 seconds    INR 1.87 (*) 0.00 - 1.49   GLUCOSE, CAPILLARY     Status: Abnormal   Collection Time   08/22/12  9:19 AM      Component Value Range Comment   Glucose-Capillary 196 (*) 70 - 99 mg/dL    Comment 1 Notify RN     GLUCOSE, CAPILLARY     Status: Abnormal   Collection Time    08/22/12 11:21 AM      Component Value Range Comment   Glucose-Capillary 186 (*) 70 - 99 mg/dL    Comment 1 Notify RN     GLUCOSE, CAPILLARY     Status: Abnormal   Collection Time   08/22/12  4:04 PM      Component Value Range Comment   Glucose-Capillary 127 (*) 70 - 99 mg/dL    Comment 1 Notify RN     GLUCOSE, CAPILLARY     Status: Abnormal   Collection Time   08/22/12 10:57 PM      Component Value Range Comment   Glucose-Capillary 127 (*) 70 - 99 mg/dL    Comment 1 Notify RN       HENT:  Patient with multiple healing abrasions.  Neck:  Tracheostomy tube in place,  No drainage. Able to speak with PMV with good vocal quality Cardiovascular: Normal rate and regular rhythm.  Pulmonary/Chest:  Decreased breath sounds at the bases coarse upper airway sounds still. No distress. Abdominal: Soft. He exhibits no distension.  Gastrostomy tube in place with some scant drainage around tube without odor.  Neurological: Reflex Scores:  Tricep reflexes are 2+ on the right side and 3+ on the left side.  Bicep reflexes are 2+ on the right side and 3+ on the left side.  Brachioradialis reflexes are 2+ on the right side and 3+ on the left side.  Patellar reflexes are 2+ on the right side and 3+ on the left side.  Achilles reflexes are 2+ on the right side and 3+ on the left side.  very alert, answers simple questions appropriately. Calls to use the bathroom. L field cut seems to be biggest visual issue at present. Exam more consistent. 3  /5 strength in the left deltoid bicep tricep and grip 4/5 in the right deltoid bicep tricep and grip 3 minus in the left hip extensor knee extensor synergistic pattern and 4/5 in the right lower extremity     Assessment/Plan:  1. Functional deficits secondary to Severe TBI multitrauma which require 3+ hours per day of interdisciplinary therapy in a comprehensive inpatient rehab setting. Physiatrist is providing close team supervision and 24 hour management of  active medical problems listed below. Physiatrist and rehab team continue to assess barriers to discharge/monitor patient progress toward functional and medical goals.  Working toward stabilizing this patient for SNF transfer. He can go to SNF at beginning of next week. FIM: FIM - Bathing Bathing Steps Patient Completed: Chest;Abdomen;Front perineal area;Left upper leg;Right upper leg Bathing: 3: Mod-Patient completes 5-7 61f 10 parts or 50-74%  FIM - Upper Body Dressing/Undressing Upper body dressing/undressing steps patient completed: Thread/unthread right sleeve of pullover shirt/dresss;Thread/unthread left sleeve of pullover shirt/dress Upper body dressing/undressing: 3: Mod-Patient completed 50-74% of tasks FIM - Lower Body Dressing/Undressing Lower body dressing/undressing steps patient completed: Thread/unthread right underwear leg;Thread/unthread left underwear leg;Thread/unthread right pants leg;Thread/unthread left pants leg Lower body dressing/undressing: 3: Mod-Patient completed 50-74% of tasks  FIM - Hotel manager Devices: Grab bar or rail for support Toileting: 1: Two helpers  FIM - Diplomatic Services operational officer Devices: Grab bars Toilet Transfers: 3-To toilet/BSC: Mod A (lift or lower assist);3-From toilet/BSC: Mod A (lift or lower assist)  FIM - Bed/Chair Transfer Bed/Chair Transfer Assistive Devices: Bed rails Bed/Chair Transfer: 4: Chair or W/C > Bed: Min A (steadying Pt. > 75%);4: Bed > Chair or W/C: Min A (steadying Pt. > 75%);4: Sit > Supine: Min A (steadying pt. > 75%/lift 1 leg);4: Supine > Sit: Min A (steadying Pt. > 75%/lift 1 leg)  FIM - Locomotion: Wheelchair Distance: 50 Locomotion: Wheelchair: 2: Travels 50 - 149 ft with minimal assistance (Pt.>75%) FIM - Locomotion: Ambulation Ambulation/Gait Assistance: 1: +2 Total assist Locomotion: Ambulation: 1: Travels less than 50 ft with moderate assistance (Pt: 50 - 74%)  Medical  Problem List and Plan:  1. 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 DVT Prophylaxis/Anticoagulation: Right upper extremity DVT. Continue Coumadin therapy. Monitor platelet counts any signs of bleeding  3. pain management. Oxycodone 10 mg every 6 hours as needed  4. Mood: Amantadine 100 mg 2 times a day, Klonopin0.5 mg twice a day, wean off, Inderal 30 mg every 12 hours, Ativan 1 mg every 4 as needed anxiety.t  Continue to monitor and modify behavior program,   -celexa qhs Agitation at noc increased Seroquel to 50mg  5. Neuropsych: This patient is not capable of making decisions on his/her own behalf.  6. Multiple facial fractures. Conservative care per ENT Dr. Emeline Darling  7. L1 transverse process fracture. Conservative care,   scheduled oxycodone  8. VDRF/tracheostomy.  Awaiting a plug for his XLT-   -cap over the weekend, if no issues decannulate  -no secretions, voice quality good, alert, etc  -no activity olerance issues,  9. Gastrostomy tube 07/03/2012. Continue nutritional support as directed.   On  D1/nectar diet --eating 75% or more 10. Bilateral pneumothorax. Resolved and chest tubes removed  11. MRSA tracheobronchitis. Vancomycin completed after 14 days. Afebrile, no cough  -pulmonary mentions abx course. i can't see where that it's indicated at this time  -continue close observation for fever, sx 12. History diabetes mellitus. Continue Lantus insulin and Glucophage as directed. Check blood sugars routine   -observe as we transition to diet 13. Urinary retention.Flomax 0.8 mg daily. Patient currently has a condom catheter. Normal PVRs x3.  14.  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  Mildly displace  R ulnar fx cast - further separation on follow up xray    -ortho reviewed films-ordered replacement of SAC  -due to cognitive issues previously he wasn't protecting the hand either--will allow for use of  hand for feeding and simple hygiene but would try to be cautions with wb activities  -pt has better awareness now to protect the hand  LOS (Days) 15 A FACE TO FACE EVALUATION WAS PERFORMED  SWARTZ,ZACHARY T 08/23/2012, 6:57 AM

## 2012-08-23 NOTE — Progress Notes (Signed)
Occupational Therapy Weekly Progress and Daily Note  Patient Details  Name: Alex Foster MRN: [id number] Date of Birth: Jun 18, 1946  Today's Date: 08/23/2012 Time: 0730-0830 Time Calculation (min): 60 min  Patient has met 3 of 4 short term goals.  Pt continues to make great gains in therapy cognitively and physically, decreasing the amount of assistance he needs with his BADLs. Pt remains in a short arm cast on the right UE due a worsened fx. Pt is able to bathe with max A and dress with mod to max A with extra time. Pt has been continent of bowel and bladder during the day, calling appropriate for toileting needs. Pt can bathe at sink or shower level. Pt can perform basic transfers to bed, chair, w/c or toilet with steady A. Pt has made improvements in ability to use and move left UE with min A in functional tasks. Pt is able to reduce subluxation on his room and has active movement in all planes. Pt's behavior is consistent with [place] with improvements in orientation, sequencing, attention, awareness and safety awareness. Pt continues to be impacted by vision.  Patient continues to demonstrate the following deficits:muscle weakness, decreased cardiorespiratoy endurance, impaired timing and sequencing, unbalanced muscle activation and decreased motor planning, decreased visual acuity, decreased visual perceptual skills and hemianopsia, decreased attention to left, decreased attention, decreased awareness, decreased problem solving and decreased memory and decreased standing balance, decreased postural control, decreased balance strategies and difficulty maintaining precautions and therefore will continue to benefit from skilled OT intervention to enhance overall performance with BADL.  Patient progressing toward long term goals..  Continue plan of care.  OT Short Term Goals Week 2:  OT Short Term Goal 1 (Week 2): Pt would don shirt with mod A  OT Short Term Goal 1 - Progress (Week 2):  Progressing toward goal OT Short Term Goal 2 (Week 2): Pt would maintain sitting balance for 5 min with supervision during functional tasks OT Short Term Goal 2 - Progress (Week 2): Met OT Short Term Goal 3 (Week 2): Pt will be oriented to self, place and month with min environmental cues OT Short Term Goal 3 - Progress (Week 2): Met OT Short Term Goal 4 (Week 2): Pt would transfer with min A to toilet/BSC OT Short Term Goal 4 - Progress (Week 2): Met Week 3:  OT Short Term Goal 1 (Week 3): Pt would perform sit to stand with ADL tasks with supervision with min cuing for no WB through right UE OT Short Term Goal 2 (Week 3): Pt would self feed with left Ue hand wtih min A (tryingto limit use of right UE) OT Short Term Goal 3 (Week 3): Pt will demonstrate emergent awareness wtih min A during functional activity OT Short Term Goal 4 (Week 3): Pt will perform toileting wtih mod A  Skilled Therapeutic Interventions/Progress Updates:    continue with POC  1:1 self care retraining at sink level with focus on use of left UE (avoiding overuse of right UE to promote healing) to assist with dressing and bathing and self feeding with left Ue. Used method of stabilization of left elbow for improved distal movement. Sit to stands without UE support with steady A to supervision with min cuing to maintain non-weight bearing through left UE. Tried adaptive curved spoon for self feeding - needed min A to scoop food and provide stabilization.   Therapy Documentation Precautions:  Precautions Precautions: Fall Precaution Comments: L shoulder subluxation and L side  hemiparesis. Peg tube, trach , right wrist fracture: in a short arm cast Restrictions Weight Bearing Restrictions: No    Pain: ongoing c/o right wrist pain  See FIM for current functional status  Therapy/Group: Individual Therapy  [name] [facility] 08/23/2012, 8:53 AM

## 2012-08-23 NOTE — Progress Notes (Signed)
Speech Language Pathology Daily Session Note  Patient Details  Name: Alex Foster MRN: [id number] Date of Birth: March 13, 1946  Today's Date: 08/23/2012 Time: 4098-1191 Time Calculation (min): 31 min  Short Term Goals: Week 2: SLP Short Term Goal 1 (Week 2): Pt will follow 2 step commands with 50% accuracy with Mod verbal cues  SLP Short Term Goal 2 (Week 2): Pt will demonstrate selective attention in a midly distracting enviornemnt for ~10 minutes with Mod A verbal cues for redirection SLP Short Term Goal 3 (Week 2): Pt will demonstrate intellectual awareness of person, place, time and situation with Mod A multimodal cueing SLP Short Term Goal 4 (Week 2): Pt will consume Dys. 1 textures and nectar-thick liquids with Min A verbal cues for utilization of swallowing compensatory strategies.  SLP Short Term Goal 5 (Week 2): Pt will demonstrate functional problem solving for familiar tasks with Mod A multimodal cueing   Skilled Therapeutic Interventions: Treatment session focused on addressing dysphagia goals.  SLP facilcaited session with supervision level verbal cues while paitent consumed Dys.1 textures and nectar-thick liquids via cup with strong reflexive cough response x2 duing p.o. intake.  Per MBSS SLP suspects penetration which cleared during study and appeared to clear today.  SLP also facilitated session with arranging for paitent to recieve a more user friendly telephone after he reported difficulty using his current phone.     FIM:  Comprehension Comprehension: 5-Understands basic 90% of the time/requires cueing < 10% of the time Expression Expression: 5-Expresses basic 90% of the time/requires cueing < 10% of the time. Social Interaction Social Interaction: 5-Interacts appropriately 90% of the time - Needs monitoring or encouragement for participation or interaction. Problem Solving Problem Solving: 4-Solves basic 75 - 89% of the time/requires cueing 10 - 24% of the  time Memory Memory: 4-Recognizes or recalls 75 - 89% of the time/requires cueing 10 - 24% of the time FIM - Eating Eating Activity: 5: Set-up assist for open containers  Pain Pain Assessment Pain Assessment: No/denies pain  Therapy/Group: Individual Therapy  Charlane Ferretti., CCC-SLP [phone]  Alex Foster 08/23/2012, 12:28 PM

## 2012-08-23 NOTE — Progress Notes (Signed)
Physical Therapy Note  Patient Details  Name: Alex Foster MRN: [id number] Date of Birth: 1946/02/29 Today's Date: 08/23/2012  Time: [phone] 60 minutes  Pt c/o back pain with w/c propulsion, eases with rest.  Gait training with +1 hand held assist.  Pt with decreased balance today, required mod-max A to correct LOB, frequent episodes of L knee buckling.  Sit to stand training with focus on core and hip stability.  Pt able to perform with close supervision - min A, fatigues quickly with core activation.  Kinetron for LE strength and endurance, pt able to perform 3 x 2 minutes with prolonged rest in between.  Pt limited today by increased balance deficits requiring increased assistance with all functional mobility.  Individual therapy   [name] 08/23/2012, 10:31 AM

## 2012-08-23 NOTE — Plan of Care (Signed)
Problem: RH BOWEL ELIMINATION Goal: RH STG MANAGE BOWEL WITH ASSISTANCE STG Manage Bowel with mod Assistance.  Outcome: Not Progressing Patients last BM on 08/20/72. A Troyce Gieske,LPN

## 2012-08-23 NOTE — Progress Notes (Signed)
Occupational Therapy Session Note  Patient Details  Name: Alex Foster MRN: [id number] Date of Birth: Feb 12, 1946  Today's Date: 08/23/2012 Time: 8413-2440 Time Calculation (min): 55 min  Skilled Therapeutic Interventions/Progress Updates:   Took pt to the OT gym worked on LUE/shoulder AAROM/AROM and strengthening.  Pt started in supine working on chest press against gravity AAROM 2 sets of 10 repetitions.  Performed 1 set of 10 reps shoulder protraction with supervision in supine as well.  Returned to sitting position and performed 2 sets of 10 repetitions of scapular abduction with min facilitation.  Used red theraband for 2 sets of shoulder extension and 1 set of external rotation with slight resistance using the red band.  Pt still with significant anterior/inferior subluxation in the left shoulder.  All transfers performed were with mod facilitation with pt attempting to sit down early before being completely squared to the surface he was going to sit down on.  Pt needs mod instructional cueing to attempt using the LUE for any scooting or sit to stand transitions as well.  Therapy Documentation Precautions:  Precautions Precautions: Fall Precaution Comments: L shoulder subluxation and L side hemiparesis. Peg tube, trach , right wrist fracture: in a short arm cast Restrictions Weight Bearing Restrictions: No  Vital Signs: Therapy Vitals Temp: 98.1 F (36.7 C) Temp src: Oral Pulse Rate: 59  Resp: 18  BP: 125/76 mmHg Patient Position, if appropriate: Sitting Oxygen Therapy SpO2: 94 % O2 Device: None (Room air) Pain: Pain Assessment Pain Assessment: 0-10 Pain Score:   4 Pain Type: Chronic pain Pain Location: Shoulder Pain Orientation: Left Pain Intervention(s): Repositioned;Medication (See eMAR)   See FIM for current functional status  Therapy/Group: Individual Therapy  Corinne Goucher OTR/L 08/23/2012, 3:54 PM

## 2012-08-24 ENCOUNTER — Inpatient Hospital Stay (HOSPITAL_COMMUNITY): Payer: Medicaid Other | Admitting: Physical Therapy

## 2012-08-24 LAB — BASIC METABOLIC PANEL
CO2: 32 mEq/L (ref 19–32)
Calcium: 9.1 mg/dL (ref 8.4–10.5)
Chloride: 101 mEq/L (ref 96–112)
Creatinine, Ser: 1.03 mg/dL (ref 0.50–1.35)
GFR calc Af Amer: 87 mL/min — ABNORMAL LOW (ref >90)
Sodium: 139 mEq/L (ref 135–145)

## 2012-08-24 LAB — CBC
Platelets: 178 10*3/uL (ref 150–400)
RBC: 3.84 MIL/uL — ABNORMAL LOW (ref 4.22–5.81)
RDW: 15.3 % (ref 11.5–15.5)
WBC: 4.8 10*3/uL (ref 4.0–10.5)

## 2012-08-24 LAB — GLUCOSE, CAPILLARY: Glucose-Capillary: 134 mg/dL — ABNORMAL HIGH (ref 70–99)

## 2012-08-24 LAB — PROTIME-INR: Prothrombin Time: 23.7 seconds — ABNORMAL HIGH (ref 11.6–15.2)

## 2012-08-24 MED ORDER — WARFARIN SODIUM 5 MG PO TABS
5.0000 mg | ORAL_TABLET | Freq: Once | ORAL | Status: AC
  Administered 2012-08-24: 5 mg via ORAL
  Filled 2012-08-24 (×2): qty 1

## 2012-08-24 NOTE — Progress Notes (Signed)
Physical Therapy Note  Patient Details  Name: Alex Foster MRN: [id number] Date of Birth: Sep 09, 1946 Today's Date: 08/24/2012  Time: 1400-1425 25 minutes  No c/o pain.  Pt's brothers present for treatment.  Gait training 2 x 15' with min-mod A +1 HHA.  Pt with improved balance vs yesterday, continues to require mod A for balance with turning.  Stair negotiation with B handrails with cues to use R UE only for steadying, cues to attend to L UE.  Pt requires mod A for stair negotiation and mod cuing for safety, especially with descending stairs.  Pt continues to require min A for w/c propulsion with B LEs due to L LE weakness and decreased coordination.  Individual therapy   Kamia Insalaco 08/24/2012, 4:16 PM

## 2012-08-24 NOTE — Progress Notes (Signed)
Patient ID: Alex Foster, male   DOB: 01/09/1946, 66 y.o.   MRN: [id number]  Subjective/Complaints:  Up alert. Very lucid and appropriate. Still hasn't gotten new trach. Needs to go to bathtroom  Review of Systems  Unable to perform ROS: mental acuity   Objective: Vital Signs: Blood pressure 162/77, pulse 61, temperature 98.4 F (36.9 C), temperature source Oral, resp. rate 17, weight 93.9 kg (207 lb 0.2 oz), SpO2 91.00%. No results found. Results for orders placed during the hospital encounter of 08/08/12 (from the past 72 hour(s))  GLUCOSE, CAPILLARY     Status: Abnormal   Collection Time   08/21/12  7:50 AM      Component Value Range Comment   Glucose-Capillary 285 (*) 70 - 99 mg/dL    Comment 1 Notify RN     GLUCOSE, CAPILLARY     Status: Abnormal   Collection Time   08/21/12 11:44 AM      Component Value Range Comment   Glucose-Capillary 131 (*) 70 - 99 mg/dL    Comment 1 Notify RN     GLUCOSE, CAPILLARY     Status: Abnormal   Collection Time   08/21/12  4:07 PM      Component Value Range Comment   Glucose-Capillary 133 (*) 70 - 99 mg/dL    Comment 1 Notify RN     GLUCOSE, CAPILLARY     Status: Abnormal   Collection Time   08/21/12  8:02 PM      Component Value Range Comment   Glucose-Capillary 180 (*) 70 - 99 mg/dL    Comment 1 Notify RN     PROTIME-INR     Status: Abnormal   Collection Time   08/22/12  6:08 AM      Component Value Range Comment   Prothrombin Time 20.8 (*) 11.6 - 15.2 seconds    INR 1.87 (*) 0.00 - 1.49   GLUCOSE, CAPILLARY     Status: Abnormal   Collection Time   08/22/12  9:19 AM      Component Value Range Comment   Glucose-Capillary 196 (*) 70 - 99 mg/dL    Comment 1 Notify RN     GLUCOSE, CAPILLARY     Status: Abnormal   Collection Time   08/22/12 11:21 AM      Component Value Range Comment   Glucose-Capillary 186 (*) 70 - 99 mg/dL    Comment 1 Notify RN     GLUCOSE, CAPILLARY     Status: Abnormal   Collection Time   08/22/12  4:04  PM      Component Value Range Comment   Glucose-Capillary 127 (*) 70 - 99 mg/dL    Comment 1 Notify RN     GLUCOSE, CAPILLARY     Status: Abnormal   Collection Time   08/22/12 10:57 PM      Component Value Range Comment   Glucose-Capillary 127 (*) 70 - 99 mg/dL    Comment 1 Notify RN     PROTIME-INR     Status: Abnormal   Collection Time   08/23/12  5:52 AM      Component Value Range Comment   Prothrombin Time 21.3 (*) 11.6 - 15.2 seconds    INR 1.93 (*) 0.00 - 1.49   GLUCOSE, CAPILLARY     Status: Abnormal   Collection Time   08/23/12  8:23 AM      Component Value Range Comment   Glucose-Capillary 139 (*) 70 - 99 mg/dL  GLUCOSE, CAPILLARY     Status: Abnormal   Collection Time   08/23/12 12:48 PM      Component Value Range Comment   Glucose-Capillary 253 (*) 70 - 99 mg/dL   GLUCOSE, CAPILLARY     Status: Abnormal   Collection Time   08/23/12  4:05 PM      Component Value Range Comment   Glucose-Capillary 127 (*) 70 - 99 mg/dL   GLUCOSE, CAPILLARY     Status: Abnormal   Collection Time   08/23/12  9:02 PM      Component Value Range Comment   Glucose-Capillary 164 (*) 70 - 99 mg/dL    Comment 1 Notify RN     PROTIME-INR     Status: Abnormal   Collection Time   08/24/12  4:20 AM      Component Value Range Comment   Prothrombin Time 23.7 (*) 11.6 - 15.2 seconds    INR 2.23 (*) 0.00 - 1.49   CBC     Status: Abnormal   Collection Time   08/24/12  4:20 AM      Component Value Range Comment   WBC 4.8  4.0 - 10.5 K/uL    RBC 3.84 (*) 4.22 - 5.81 MIL/uL    Hemoglobin 10.3 (*) 13.0 - 17.0 g/dL    HCT 16.1 (*) 09.6 - 52.0 %    MCV 84.4  78.0 - 100.0 fL    MCH 26.8  26.0 - 34.0 pg    MCHC 31.8  30.0 - 36.0 g/dL    RDW 04.5  40.9 - 81.1 %    Platelets 178  150 - 400 K/uL   BASIC METABOLIC PANEL     Status: Abnormal   Collection Time   08/24/12  4:20 AM      Component Value Range Comment   Sodium 139  135 - 145 mEq/L    Potassium 4.2  3.5 - 5.1 mEq/L    Chloride 101   96 - 112 mEq/L    CO2 32  19 - 32 mEq/L    Glucose, Bld 110 (*) 70 - 99 mg/dL    BUN 24 (*) 6 - 23 mg/dL    Creatinine, Ser 9.14  0.50 - 1.35 mg/dL    Calcium 9.1  8.4 - 78.2 mg/dL    GFR calc non Af Amer 75 (*) >90 mL/min    GFR calc Af Amer 87 (*) >90 mL/min     HENT:  Patient with multiple healing abrasions.  Neck:  Tracheostomy tube in place,  No drainage. Able to speak with PMV with good vocal quality Cardiovascular: Normal rate and regular rhythm.  Pulmonary/Chest:  Decreased breath sounds at the bases coarse upper airway sounds still. No distress. Abdominal: Soft. He exhibits no distension.  Gastrostomy tube in place with some scant drainage around tube without odor.  Neurological: Reflex Scores:  Tricep reflexes are 2+ on the right side and 3+ on the left side.  Bicep reflexes are 2+ on the right side and 3+ on the left side.  Brachioradialis reflexes are 2+ on the right side and 3+ on the left side.  Patellar reflexes are 2+ on the right side and 3+ on the left side.  Achilles reflexes are 2+ on the right side and 3+ on the left side.  very alert, answers simple questions appropriately. Calls to use the bathroom. L field cut seems to be biggest visual issue at present. Exam more consistent. 3  /5 strength in  the left deltoid bicep tricep and grip 4/5 in the right deltoid bicep tricep and grip 3 minus in the left hip extensor knee extensor synergistic pattern and 4/5 in the right lower extremity     Assessment/Plan: 1. Functional deficits secondary to Severe TBI multitrauma which require 3+ hours per day of interdisciplinary therapy in a comprehensive inpatient rehab setting. Physiatrist is providing close team supervision and 24 hour management of active medical problems listed below. Physiatrist and rehab team continue to assess barriers to discharge/monitor patient progress toward functional and medical goals.  Working toward stabilizing this patient for SNF transfer. He  can go to SNF at beginning of next week. FIM: FIM - Bathing Bathing Steps Patient Completed: Chest;Abdomen;Front perineal area;Left upper leg;Right upper leg Bathing: 3: Mod-Patient completes 5-7 30f 10 parts or 50-74%  FIM - Upper Body Dressing/Undressing Upper body dressing/undressing steps patient completed: Thread/unthread right sleeve of pullover shirt/dresss;Thread/unthread left sleeve of pullover shirt/dress Upper body dressing/undressing: 3: Mod-Patient completed 50-74% of tasks FIM - Lower Body Dressing/Undressing Lower body dressing/undressing steps patient completed: Thread/unthread right underwear leg;Thread/unthread left underwear leg;Thread/unthread right pants leg;Thread/unthread left pants leg Lower body dressing/undressing: 3: Mod-Patient completed 50-74% of tasks  FIM - Hotel manager Devices: Grab bar or rail for support Toileting: 7: Independent: No helper, no device  FIM - Diplomatic Services operational officer Devices: Grab bars Toilet Transfers: 3-To toilet/BSC: Mod A (lift or lower assist);3-From toilet/BSC: Mod A (lift or lower assist)  FIM - Bed/Chair Transfer Bed/Chair Transfer Assistive Devices: Bed rails Bed/Chair Transfer: 5: Supine > Sit: Supervision (verbal cues/safety issues);4: Sit > Supine: Min A (steadying pt. > 75%/lift 1 leg);4: Bed > Chair or W/C: Min A (steadying Pt. > 75%);4: Chair or W/C > Bed: Min A (steadying Pt. > 75%)  FIM - Locomotion: Wheelchair Distance: 100 Locomotion: Wheelchair: 2: Travels 50 - 149 ft with moderate assistance (Pt: 50 - 74%) FIM - Locomotion: Ambulation Locomotion: Ambulation Assistive Devices: Other (comment) (HHA) Ambulation/Gait Assistance: 3: Mod assist Locomotion: Ambulation: 1: Travels less than 50 ft with moderate assistance (Pt: 50 - 74%)  Medical Problem List and Plan:  1. 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 DVT Prophylaxis/Anticoagulation: Right  upper extremity DVT. Continue Coumadin therapy. Monitor platelet counts any signs of bleeding  3. pain management. Oxycodone 10 mg every 6 hours as needed  4. Mood: Amantadine 100 mg 2 times a day, Klonopin0.5 mg twice a day, wean off, Inderal 30 mg every 12 hours, Ativan 1 mg every 4 as needed anxiety.t  Continue to monitor and modify behavior program,   -celexa qhs Agitation at noc increased Seroquel to 50mg  5. Neuropsych: This patient is not capable of making decisions on his/her own behalf.  6. Multiple facial fractures. Conservative care per ENT Dr. Emeline Darling  7. L1 transverse process fracture. Conservative care,   scheduled oxycodone  8. VDRF/tracheostomy.   Downsize to 4 with cap  -cap over the weekend, if no issues decannulate--dc continuous pulse ox  -no secretions, voice quality good, alert, etc  -no activity olerance issues,  9. Gastrostomy tube 07/03/2012. Continue nutritional support as directed.   On  D1/nectar diet --eating 75% or more 10. Bilateral pneumothorax. Resolved and chest tubes removed  11. MRSA tracheobronchitis. Vancomycin completed after 14 days. Afebrile, no cough  -pulmonary mentions abx course. i can't see where that it's indicated at this time  -continue close observation for fever, sx 12. History diabetes mellitus. Continue Lantus insulin and Glucophage as  directed. Check blood sugars routine   -observe as we transition to diet 13. Urinary retention.Flomax 0.8 mg daily. Patient currently has a condom catheter. Normal PVRs x3.  14.  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  Mildly displace R ulnar fx cast - further separation on follow up xray    -ortho reviewed films-ordered replacement of SAC  -due to cognitive issues previously he wasn't protecting the hand either--will allow for use of hand for feeding and simple hygiene but would try to be cautions with wb activities  -pt has better awareness now to protect the  hand  LOS (Days) 16 A FACE TO FACE EVALUATION WAS PERFORMED  Adylee Leonardo T 08/24/2012, 7:15 AM

## 2012-08-24 NOTE — Progress Notes (Signed)
Respiratory therapist called  around 8am to follow up on order put in 11/22 by Dr. Riley Kill  regarding tracheostomy change. Per respiratory, she will come up and check if patient has the necessary materials he needed. Checked on respiratory again to see if they have done the trach change and I was told that it will be done today even after 3 pm. Around. 5 pm I followed up again and respiratory said she needs the shiley#[id number] xlt. I called [facility] and they said item is not available. Called [facility] to let them know it's unavailable. MD notified.

## 2012-08-24 NOTE — Progress Notes (Signed)
ANTICOAGULATION CONSULT NOTE - Follow Up Consult  Pharmacy Consult:  Coumadin Indication:  RUE DVT  Allergies  Allergen Reactions  . Penicillins    Patient Measurements: Weight: 207 lb 0.2 oz (93.9 kg)  Vital Signs: Temp: 98.4 F (36.9 C) (11/23 0612) Temp src: Oral (11/23 0612) BP: 162/77 mmHg (11/23 0612) Pulse Rate: 61  (11/23 0612)  Labs:  Alvira Philips 08/24/12 0420 08/23/12 0552 08/22/12 0608  HGB 10.3* -- --  HCT 32.4* -- --  PLT 178 -- --  APTT -- -- --  LABPROT 23.7* 21.3* 20.8*  INR 2.23* 1.93* 1.87*  HEPARINUNFRC -- -- --  CREATININE 1.03 -- --  CKTOTAL -- -- --  CKMB -- -- --  TROPONINI -- -- --   Assessment: 50 YOM admitted to rehab s/p TBI / multi-trauma with hemorrhagic contusion to the head.  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  He developed and upper extremity DVT and is now continued on Coumadin while he is being cared for in the rehab unit.  INR now therapeutic.  He has no noted bleeding complications, Hg stable.     Goal of Therapy:  INR 2 - 3 (aim for 2 - 2.5 d/t recent TBI with SAH) Monitor platelets by anticoagulation protocol: Yes   Plan:  - Coumadin 5 mg PO today - Daily PT / INR   Talbert Cage, PharmD. Clinical Pharmacist Pager:  [phone] Thank you for allowing pharmacy to be part of this patients care team. 08/24/2012, 8:24 AM

## 2012-08-25 ENCOUNTER — Inpatient Hospital Stay (HOSPITAL_COMMUNITY): Payer: Self-pay | Admitting: Occupational Therapy

## 2012-08-25 LAB — GLUCOSE, CAPILLARY
Glucose-Capillary: 176 mg/dL — ABNORMAL HIGH (ref 70–99)
Glucose-Capillary: 198 mg/dL — ABNORMAL HIGH (ref 70–99)
Glucose-Capillary: 156 mg/dL — ABNORMAL HIGH (ref 70–99)
Glucose-Capillary: 134 mg/dL — ABNORMAL HIGH (ref 70–99)

## 2012-08-25 LAB — URINALYSIS, ROUTINE W REFLEX MICROSCOPIC
Bilirubin Urine: NEGATIVE
Glucose, UA: NEGATIVE mg/dL
Hgb urine dipstick: NEGATIVE
Protein, ur: 30 mg/dL — AB
Urobilinogen, UA: 1 mg/dL (ref 0.0–1.0)

## 2012-08-25 LAB — URINE MICROSCOPIC-ADD ON

## 2012-08-25 MED ORDER — WARFARIN SODIUM 6 MG PO TABS
6.0000 mg | ORAL_TABLET | Freq: Once | ORAL | Status: AC
  Administered 2012-08-25: 6 mg via ORAL
  Filled 2012-08-25: qty 1

## 2012-08-25 NOTE — Progress Notes (Signed)
Occupational Therapy Session Note  Patient Details  Name: Alex Foster MRN: [id number] Date of Birth: Sep 03, 1946  Today's Date: 08/25/2012 Time: 1308-6578 Time Calculation (min): 45 min  Skilled Therapeutic Interventions/Progress Updates:    Focused on LUE strengthening during session.  Worked initially in standing using the LUE to perform table slides forward, backward, and in circular patterns.  Had pt try to focus on not letting LLE hyperextend and to keep his palm flat while washing the table.  Pt would tend to flex his fingers to grab the washcloth at times.  Pt complained of LLE and back pain after standing for 2-3 intervals of 1 minute so transitioned to sitting and using ROM arc.  Pt needing min facilitation to move rings from one side to the other.  Pt tends to lean to the right when attempting to reach with his left arm, and also attempts to hike his shoulder.  Finished session using UE ergonometer with just the LUE only and mod facilitation to keep it up for 1 minute intervals.  Rotated between forward and reverse for 5 intervals of one minute in sitting position.  Therapy Documentation Precautions:  Precautions Precautions: Fall Precaution Comments: L shoulder subluxation and L side hemiparesis. Peg tube, trach , right wrist fracture: in a short arm cast Restrictions Weight Bearing Restrictions: No  Pain: Pain Assessment Pain Assessment: 0-10 Pain Score:   4 Pain Type: Chronic pain Pain Location: Back Pain Descriptors: Aching Pain Intervention(s): Repositioned  See FIM for current functional status  Therapy/Group: Individual Therapy  Alex Foster OTR/L 08/25/2012, 1:59 PM

## 2012-08-25 NOTE — Progress Notes (Signed)
Patient ID: Alex Foster, male   DOB: April 25, 1946, 66 y.o.   MRN: [id number]  Subjective/Complaints:  Up alert. In bathroom  Review of Systems  Unable to perform ROS: mental acuity   Objective: Vital Signs: Blood pressure 155/91, pulse 62, temperature 97.3 F (36.3 C), temperature source Oral, resp. rate 18, weight 93.9 kg (207 lb 0.2 oz), SpO2 99.00%. No results found. Results for orders placed during the hospital encounter of 08/08/12 (from the past 72 hour(s))  GLUCOSE, CAPILLARY     Status: Abnormal   Collection Time   08/22/12  9:19 AM      Component Value Range Comment   Glucose-Capillary 196 (*) 70 - 99 mg/dL    Comment 1 Notify RN     GLUCOSE, CAPILLARY     Status: Abnormal   Collection Time   08/22/12 11:21 AM      Component Value Range Comment   Glucose-Capillary 186 (*) 70 - 99 mg/dL    Comment 1 Notify RN     GLUCOSE, CAPILLARY     Status: Abnormal   Collection Time   08/22/12  4:04 PM      Component Value Range Comment   Glucose-Capillary 127 (*) 70 - 99 mg/dL    Comment 1 Notify RN     GLUCOSE, CAPILLARY     Status: Abnormal   Collection Time   08/22/12 10:57 PM      Component Value Range Comment   Glucose-Capillary 127 (*) 70 - 99 mg/dL    Comment 1 Notify RN     PROTIME-INR     Status: Abnormal   Collection Time   08/23/12  5:52 AM      Component Value Range Comment   Prothrombin Time 21.3 (*) 11.6 - 15.2 seconds    INR 1.93 (*) 0.00 - 1.49   GLUCOSE, CAPILLARY     Status: Abnormal   Collection Time   08/23/12  8:23 AM      Component Value Range Comment   Glucose-Capillary 139 (*) 70 - 99 mg/dL   GLUCOSE, CAPILLARY     Status: Abnormal   Collection Time   08/23/12 12:48 PM      Component Value Range Comment   Glucose-Capillary 253 (*) 70 - 99 mg/dL   GLUCOSE, CAPILLARY     Status: Abnormal   Collection Time   08/23/12  4:05 PM      Component Value Range Comment   Glucose-Capillary 127 (*) 70 - 99 mg/dL   GLUCOSE, CAPILLARY     Status: Abnormal    Collection Time   08/23/12  9:02 PM      Component Value Range Comment   Glucose-Capillary 164 (*) 70 - 99 mg/dL    Comment 1 Notify RN     PROTIME-INR     Status: Abnormal   Collection Time   08/24/12  4:20 AM      Component Value Range Comment   Prothrombin Time 23.7 (*) 11.6 - 15.2 seconds    INR 2.23 (*) 0.00 - 1.49   CBC     Status: Abnormal   Collection Time   08/24/12  4:20 AM      Component Value Range Comment   WBC 4.8  4.0 - 10.5 K/uL    RBC 3.84 (*) 4.22 - 5.81 MIL/uL    Hemoglobin 10.3 (*) 13.0 - 17.0 g/dL    HCT 40.9 (*) 81.1 - 52.0 %    MCV 84.4  78.0 - 100.0 fL  MCH 26.8  26.0 - 34.0 pg    MCHC 31.8  30.0 - 36.0 g/dL    RDW 16.1  09.6 - 04.5 %    Platelets 178  150 - 400 K/uL   BASIC METABOLIC PANEL     Status: Abnormal   Collection Time   08/24/12  4:20 AM      Component Value Range Comment   Sodium 139  135 - 145 mEq/L    Potassium 4.2  3.5 - 5.1 mEq/L    Chloride 101  96 - 112 mEq/L    CO2 32  19 - 32 mEq/L    Glucose, Bld 110 (*) 70 - 99 mg/dL    BUN 24 (*) 6 - 23 mg/dL    Creatinine, Ser 4.09  0.50 - 1.35 mg/dL    Calcium 9.1  8.4 - 81.1 mg/dL    GFR calc non Af Amer 75 (*) >90 mL/min    GFR calc Af Amer 87 (*) >90 mL/min   GLUCOSE, CAPILLARY     Status: Abnormal   Collection Time   08/24/12  8:03 AM      Component Value Range Comment   Glucose-Capillary 168 (*) 70 - 99 mg/dL   GLUCOSE, CAPILLARY     Status: Abnormal   Collection Time   08/24/12 11:38 AM      Component Value Range Comment   Glucose-Capillary 202 (*) 70 - 99 mg/dL   GLUCOSE, CAPILLARY     Status: Abnormal   Collection Time   08/24/12  4:08 PM      Component Value Range Comment   Glucose-Capillary 128 (*) 70 - 99 mg/dL   GLUCOSE, CAPILLARY     Status: Abnormal   Collection Time   08/24/12  9:39 PM      Component Value Range Comment   Glucose-Capillary 134 (*) 70 - 99 mg/dL    Comment 1 Notify RN     PROTIME-INR     Status: Abnormal   Collection Time   08/25/12  5:48  AM      Component Value Range Comment   Prothrombin Time 22.2 (*) 11.6 - 15.2 seconds    INR 2.04 (*) 0.00 - 1.49     HENT:  Patient with multiple healing abrasions.  Neck:  Tracheostomy tube in place,  No drainage. Able to speak with PMV with good vocal quality Cardiovascular: Normal rate and regular rhythm.  Pulmonary/Chest:  Decreased breath sounds at the bases coarse upper airway sounds still. No distress. Abdominal: Soft. He exhibits no distension.  Gastrostomy tube in place with some scant drainage around tube without odor.  Neurological: Reflex Scores:  Tricep reflexes are 2+ on the right side and 3+ on the left side.  Bicep reflexes are 2+ on the right side and 3+ on the left side.  Brachioradialis reflexes are 2+ on the right side and 3+ on the left side.  Patellar reflexes are 2+ on the right side and 3+ on the left side.  Achilles reflexes are 2+ on the right side and 3+ on the left side.  very alert, answers simple questions appropriately. Calls to use the bathroom. L field cut seems to be biggest visual issue at present. Exam more consistent. 3  /5 strength in the left deltoid bicep tricep and grip 4/5 in the right deltoid bicep tricep and grip 3 minus in the left hip extensor knee extensor synergistic pattern and 4/5 in the right lower extremity     Assessment/Plan: 1.  Functional deficits secondary to Severe TBI multitrauma which require 3+ hours per day of interdisciplinary therapy in a comprehensive inpatient rehab setting. Physiatrist is providing close team supervision and 24 hour management of active medical problems listed below. Physiatrist and rehab team continue to assess barriers to discharge/monitor patient progress toward functional and medical goals.  Working toward stabilizing this patient for SNF transfer. He can go to SNF at beginning of next week. FIM: FIM - Bathing Bathing Steps Patient Completed: Chest;Abdomen;Front perineal area;Left upper leg;Right  upper leg Bathing: 3: Mod-Patient completes 5-7 63f 10 parts or 50-74%  FIM - Upper Body Dressing/Undressing Upper body dressing/undressing steps patient completed: Thread/unthread right sleeve of pullover shirt/dresss;Thread/unthread left sleeve of pullover shirt/dress Upper body dressing/undressing: 3: Mod-Patient completed 50-74% of tasks FIM - Lower Body Dressing/Undressing Lower body dressing/undressing steps patient completed: Thread/unthread right underwear leg;Thread/unthread left underwear leg;Thread/unthread right pants leg;Thread/unthread left pants leg Lower body dressing/undressing: 3: Mod-Patient completed 50-74% of tasks  FIM - Hotel manager Devices: Grab bar or rail for support Toileting: 7: Independent: No helper, no device  FIM - Diplomatic Services operational officer Devices: Grab bars Toilet Transfers: 3-To toilet/BSC: Mod A (lift or lower assist);3-From toilet/BSC: Mod A (lift or lower assist)  FIM - Bed/Chair Transfer Bed/Chair Transfer Assistive Devices: Bed rails Bed/Chair Transfer: 5: Supine > Sit: Supervision (verbal cues/safety issues);4: Sit > Supine: Min A (steadying pt. > 75%/lift 1 leg);4: Bed > Chair or W/C: Min A (steadying Pt. > 75%);4: Chair or W/C > Bed: Min A (steadying Pt. > 75%)  FIM - Locomotion: Wheelchair Distance: 100 Locomotion: Wheelchair: 2: Travels 50 - 149 ft with moderate assistance (Pt: 50 - 74%) FIM - Locomotion: Ambulation Locomotion: Ambulation Assistive Devices: Other (comment) (HHA) Ambulation/Gait Assistance: 3: Mod assist Locomotion: Ambulation: 1: Travels less than 50 ft with moderate assistance (Pt: 50 - 74%)  Medical Problem List and Plan:  1. 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 DVT Prophylaxis/Anticoagulation: Right upper extremity DVT. Continue Coumadin therapy. Monitor platelet counts any signs of bleeding  3. pain management. Oxycodone 10 mg every 6 hours as  needed  4. Mood: Amantadine 100 mg 2 times a day, Klonopin0.5 mg twice a day, wean off, Inderal 30 mg every 12 hours, Ativan 1 mg every 4 as needed anxiety.t  Continue to monitor and modify behavior program,   -celexa qhs Agitation at noc increased Seroquel to 50mg  5. Neuropsych: This patient is not capable of making decisions on his/her own behalf.  6. Multiple facial fractures. Conservative care per ENT Dr. Emeline Darling  7. L1 transverse process fracture. Conservative care,   scheduled oxycodone  8. VDRF/tracheostomy.   Continue PMV at all times. May just decannulate from #6 XLT  -it has been as fiasco with the trach situation   -no secretions, voice quality good, alert, etc  -no activity olerance issues, 9. Gastrostomy tube 07/03/2012. Continue nutritional support as directed.   On  D1/nectar diet --eating 75% or more 10. Bilateral pneumothorax. Resolved and chest tubes removed  11. MRSA tracheobronchitis. Vancomycin completed after 14 days. Afebrile, no cough  -pulmonary mentions abx course. i can't see where that it's indicated at this time  -continue close observation for fever, sx 12. History diabetes mellitus. Continue Lantus insulin and Glucophage as directed. Check blood sugars routine   -observe as we transition to diet 13. Urinary retention.Flomax 0.8 mg daily. Patient currently has a condom catheter. Normal PVRs x3.  14.  R PCA distribution watershed infarct,  which would explain L homonymous hemianopsia,    15.  Mildly displace R ulnar fx cast - further separation on follow up xray    -ortho reviewed films-ordered replacement of SAC  -due to cognitive issues previously he wasn't protecting the hand either--will allow for use of hand for feeding and simple hygiene but would try to be cautions with wb activities  -pt has better awareness now to protect the hand  LOS (Days) 17 A FACE TO FACE EVALUATION WAS PERFORMED  Travares Nelles T 08/25/2012, 7:37 AM

## 2012-08-25 NOTE — Progress Notes (Signed)
ANTICOAGULATION CONSULT NOTE - Follow Up Consult  Pharmacy Consult:  Coumadin Indication:  RUE DVT  Allergies  Allergen Reactions  . Penicillins    Patient Measurements: Weight: 207 lb 0.2 oz (93.9 kg)  Vital Signs: Temp: 97.3 F (36.3 C) (11/24 0558) Temp src: Oral (11/24 0558) BP: 155/91 mmHg (11/24 0558) Pulse Rate: 62  (11/24 0558)  Labs:  Basename 08/25/12 0548 08/24/12 0420 08/23/12 0552  HGB -- 10.3* --  HCT -- 32.4* --  PLT -- 178 --  APTT -- -- --  LABPROT 22.2* 23.7* 21.3*  INR 2.04* 2.23* 1.93*  HEPARINUNFRC -- -- --  CREATININE -- 1.03 --  CKTOTAL -- -- --  CKMB -- -- --  TROPONINI -- -- --   Assessment: 56 YOM admitted to rehab s/p TBI / multi-trauma with hemorrhagic contusion to the head.  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  He developed and upper extremity DVT and is now continued on Coumadin while he is being cared for in the rehab unit.  INR now therapeutic.  He has no noted bleeding complications, Hg stable.     Goal of Therapy:  INR 2 - 3 (aim for 2 - 2.5 d/t recent TBI with SAH) Monitor platelets by anticoagulation protocol: Yes   Plan:  - Coumadin 6 mg PO today - Daily PT / INR   Talbert Cage, PharmD. Clinical Pharmacist Pager:  [phone] Thank you for allowing pharmacy to be part of this patients care team. 08/25/2012, 8:50 AM

## 2012-08-25 NOTE — Progress Notes (Addendum)
Patient unable to void. Scanned for 254 ml. I&O cath for 450 cc. Ua/cs sent per md order.

## 2012-08-26 ENCOUNTER — Inpatient Hospital Stay (HOSPITAL_COMMUNITY): Payer: Medicaid Other | Admitting: Physical Therapy

## 2012-08-26 ENCOUNTER — Encounter (HOSPITAL_COMMUNITY): Payer: Self-pay | Admitting: Occupational Therapy

## 2012-08-26 ENCOUNTER — Inpatient Hospital Stay (HOSPITAL_COMMUNITY): Payer: Self-pay | Admitting: Occupational Therapy

## 2012-08-26 ENCOUNTER — Inpatient Hospital Stay (HOSPITAL_COMMUNITY): Payer: Medicaid Other | Admitting: Speech Pathology

## 2012-08-26 DIAGNOSIS — F17200 Nicotine dependence, unspecified, uncomplicated: Secondary | ICD-10-CM

## 2012-08-26 DIAGNOSIS — S069X9A Unspecified intracranial injury with loss of consciousness of unspecified duration, initial encounter: Secondary | ICD-10-CM

## 2012-08-26 LAB — BASIC METABOLIC PANEL
CO2: 30 mEq/L (ref 19–32)
Calcium: 9.4 mg/dL (ref 8.4–10.5)
GFR calc non Af Amer: 77 mL/min — ABNORMAL LOW (ref >90)
Glucose, Bld: 127 mg/dL — ABNORMAL HIGH (ref 70–99)
Potassium: 4.4 mEq/L (ref 3.5–5.1)
Sodium: 137 mEq/L (ref 135–145)

## 2012-08-26 LAB — GLUCOSE, CAPILLARY
Glucose-Capillary: 137 mg/dL — ABNORMAL HIGH (ref 70–99)
Glucose-Capillary: 128 mg/dL — ABNORMAL HIGH (ref 70–99)
Glucose-Capillary: 157 mg/dL — ABNORMAL HIGH (ref 70–99)

## 2012-08-26 LAB — PROTIME-INR: Prothrombin Time: 21.6 seconds — ABNORMAL HIGH (ref 11.6–15.2)

## 2012-08-26 MED ORDER — WARFARIN SODIUM 7.5 MG PO TABS
7.5000 mg | ORAL_TABLET | Freq: Once | ORAL | Status: AC
  Administered 2012-08-26: 7.5 mg via ORAL
  Filled 2012-08-26: qty 1

## 2012-08-26 NOTE — Progress Notes (Signed)
Pulmonary/Critical Care Progress Note  S: Patient feels much better, reports being able to clear his secretions regularly.  Has been sleeping with PMV on for days.  O: Chronically Ill appearing, NAD.  VSS-AF.  HEENT: Trach site clean.  Hrt: RRR, Nl S1/S2, -M/R/G.  Lung: Coarse BS diffusely.  Abd: Soft, NT, ND and +BS.  Ext: -edema and -tenderness.  Labs: Noted.  A/P: 66 year old with a tracheostomy after trauma.  He is currently much improved with a proximal extra long cuffless 6 trach.  Will decannulate and monitor.  Will F/U.  Alyson Reedy, M.D. [facility] Pulmonary/Critical Care Medicine. Pager: [phone]. After hours pager: [phone].

## 2012-08-26 NOTE — Progress Notes (Signed)
Physical Therapy Note  Patient Details  Name: Alex Foster MRN: [id number] Date of Birth: 1946/04/15 Today's Date: 08/26/2012  1000-1055 (55 minutes) individual Pain: no reported pain/ soreness right wrist/premedicated Precautions: NWB RT wrist Focus of treatment: Therapeutic exercise focused on activity tolerance monitoring oxygen sats Treatment: transfers stand/turn min assist with min assist wc setup; Nustep Level 4 X 10 minutes with 4 rest breaks (Oxygen sats > 92% RA); Gait - HHA on left mod to max assist to prevent fall with increased forward trunk lean and mod vcs for consistent step length on left; gait with EVA walker 40-50 feet X 2  mod assist with max vcs to decrease forward lean on AD ( one standing rest break). Distance limited by decrease endurance and reported weakness Lt LE. Quick release belt in place.  Payton Prinsen,[name] 08/26/2012, 10:34 AM

## 2012-08-26 NOTE — Progress Notes (Signed)
Patient ID: Wilbon Obenchain, male   DOB: 16-Apr-1946, 66 y.o.   MRN: [id number]  Subjective/Complaints:  Up alert. Eating. No complaints. Breathing well  Review of Systems  Unable to perform ROS: mental acuity   Objective: Vital Signs: Blood pressure 178/85, pulse 63, temperature 96.6 F (35.9 C), temperature source Oral, resp. rate 16, weight 93.9 kg (207 lb 0.2 oz), SpO2 94.00%. No results found. Results for orders placed during the hospital encounter of 08/08/12 (from the past 72 hour(s))  GLUCOSE, CAPILLARY     Status: Abnormal   Collection Time   08/23/12  8:23 AM      Component Value Range Comment   Glucose-Capillary 139 (*) 70 - 99 mg/dL   GLUCOSE, CAPILLARY     Status: Abnormal   Collection Time   08/23/12 12:48 PM      Component Value Range Comment   Glucose-Capillary 253 (*) 70 - 99 mg/dL   GLUCOSE, CAPILLARY     Status: Abnormal   Collection Time   08/23/12  4:05 PM      Component Value Range Comment   Glucose-Capillary 127 (*) 70 - 99 mg/dL   GLUCOSE, CAPILLARY     Status: Abnormal   Collection Time   08/23/12  9:02 PM      Component Value Range Comment   Glucose-Capillary 164 (*) 70 - 99 mg/dL    Comment 1 Notify RN     PROTIME-INR     Status: Abnormal   Collection Time   08/24/12  4:20 AM      Component Value Range Comment   Prothrombin Time 23.7 (*) 11.6 - 15.2 seconds    INR 2.23 (*) 0.00 - 1.49   CBC     Status: Abnormal   Collection Time   08/24/12  4:20 AM      Component Value Range Comment   WBC 4.8  4.0 - 10.5 K/uL    RBC 3.84 (*) 4.22 - 5.81 MIL/uL    Hemoglobin 10.3 (*) 13.0 - 17.0 g/dL    HCT 40.9 (*) 81.1 - 52.0 %    MCV 84.4  78.0 - 100.0 fL    MCH 26.8  26.0 - 34.0 pg    MCHC 31.8  30.0 - 36.0 g/dL    RDW 91.4  78.2 - 95.6 %    Platelets 178  150 - 400 K/uL   BASIC METABOLIC PANEL     Status: Abnormal   Collection Time   08/24/12  4:20 AM      Component Value Range Comment   Sodium 139  135 - 145 mEq/L    Potassium 4.2  3.5 - 5.1 mEq/L     Chloride 101  96 - 112 mEq/L    CO2 32  19 - 32 mEq/L    Glucose, Bld 110 (*) 70 - 99 mg/dL    BUN 24 (*) 6 - 23 mg/dL    Creatinine, Ser 2.13  0.50 - 1.35 mg/dL    Calcium 9.1  8.4 - 08.6 mg/dL    GFR calc non Af Amer 75 (*) >90 mL/min    GFR calc Af Amer 87 (*) >90 mL/min   GLUCOSE, CAPILLARY     Status: Abnormal   Collection Time   08/24/12  8:03 AM      Component Value Range Comment   Glucose-Capillary 168 (*) 70 - 99 mg/dL   GLUCOSE, CAPILLARY     Status: Abnormal   Collection Time   08/24/12 11:38 AM  Component Value Range Comment   Glucose-Capillary 202 (*) 70 - 99 mg/dL   GLUCOSE, CAPILLARY     Status: Abnormal   Collection Time   08/24/12  4:08 PM      Component Value Range Comment   Glucose-Capillary 128 (*) 70 - 99 mg/dL   GLUCOSE, CAPILLARY     Status: Abnormal   Collection Time   08/24/12  9:39 PM      Component Value Range Comment   Glucose-Capillary 134 (*) 70 - 99 mg/dL    Comment 1 Notify RN     PROTIME-INR     Status: Abnormal   Collection Time   08/25/12  5:48 AM      Component Value Range Comment   Prothrombin Time 22.2 (*) 11.6 - 15.2 seconds    INR 2.04 (*) 0.00 - 1.49   GLUCOSE, CAPILLARY     Status: Abnormal   Collection Time   08/25/12  7:31 AM      Component Value Range Comment   Glucose-Capillary 176 (*) 70 - 99 mg/dL   GLUCOSE, CAPILLARY     Status: Abnormal   Collection Time   08/25/12 11:45 AM      Component Value Range Comment   Glucose-Capillary 198 (*) 70 - 99 mg/dL   GLUCOSE, CAPILLARY     Status: Abnormal   Collection Time   08/25/12  4:31 PM      Component Value Range Comment   Glucose-Capillary 156 (*) 70 - 99 mg/dL   URINALYSIS, ROUTINE W REFLEX MICROSCOPIC     Status: Abnormal   Collection Time   08/25/12  7:00 PM      Component Value Range Comment   Color, Urine YELLOW  YELLOW    APPearance CLEAR  CLEAR    Specific Gravity, Urine 1.025  1.005 - 1.030    pH 7.0  5.0 - 8.0    Glucose, UA NEGATIVE  NEGATIVE mg/dL      Hgb urine dipstick NEGATIVE  NEGATIVE    Bilirubin Urine NEGATIVE  NEGATIVE    Ketones, ur NEGATIVE  NEGATIVE mg/dL    Protein, ur 30 (*) NEGATIVE mg/dL    Urobilinogen, UA 1.0  0.0 - 1.0 mg/dL    Nitrite NEGATIVE  NEGATIVE    Leukocytes, UA NEGATIVE  NEGATIVE   URINE MICROSCOPIC-ADD ON     Status: Normal   Collection Time   08/25/12  7:00 PM      Component Value Range Comment   Squamous Epithelial / LPF RARE  RARE    WBC, UA 0-2  <3 WBC/hpf    RBC / HPF 0-2  <3 RBC/hpf   GLUCOSE, CAPILLARY     Status: Abnormal   Collection Time   08/25/12  9:40 PM      Component Value Range Comment   Glucose-Capillary 134 (*) 70 - 99 mg/dL     HENT:  Patient with multiple healing abrasions.  Neck:  Tracheostomy tube in place,  No drainage. Able to speak with PMV with good vocal quality Cardiovascular: Normal rate and regular rhythm.  Pulmonary/Chest:  Decreased breath sounds at the bases coarse upper airway sounds still. No distress. Abdominal: Soft. He exhibits no distension.  Gastrostomy tube in place with some scant drainage around tube without odor.  Neurological: Reflex Scores:  Tricep reflexes are 2+ on the right side and 3+ on the left side.  Bicep reflexes are 2+ on the right side and 3+ on the left side.  Brachioradialis reflexes  are 2+ on the right side and 3+ on the left side.  Patellar reflexes are 2+ on the right side and 3+ on the left side.  Achilles reflexes are 2+ on the right side and 3+ on the left side.  very alert, answers simple questions appropriately. Calls to use the bathroom. L field cut seems to be biggest visual issue at present. Exam more consistent. 3  /5 strength in the left deltoid bicep tricep and grip 4/5 in the right deltoid bicep tricep and grip 3 minus in the left hip extensor knee extensor synergistic pattern and 4/5 in the right lower extremity     Assessment/Plan: 1. Functional deficits secondary to Severe TBI multitrauma which require 3+ hours per  day of interdisciplinary therapy in a comprehensive inpatient rehab setting. Physiatrist is providing close team supervision and 24 hour management of active medical problems listed below. Physiatrist and rehab team continue to assess barriers to discharge/monitor patient progress toward functional and medical goals.  Working toward stabilizing this patient for SNF transfer. He can go to SNF at beginning of next week. FIM: FIM - Bathing Bathing Steps Patient Completed: Chest;Abdomen;Front perineal area;Left upper leg;Right upper leg Bathing: 3: Mod-Patient completes 5-7 31f 10 parts or 50-74%  FIM - Upper Body Dressing/Undressing Upper body dressing/undressing steps patient completed: Thread/unthread right sleeve of pullover shirt/dresss;Thread/unthread left sleeve of pullover shirt/dress Upper body dressing/undressing: 3: Mod-Patient completed 50-74% of tasks FIM - Lower Body Dressing/Undressing Lower body dressing/undressing steps patient completed: Thread/unthread right underwear leg;Thread/unthread left underwear leg;Thread/unthread right pants leg;Thread/unthread left pants leg Lower body dressing/undressing: 3: Mod-Patient completed 50-74% of tasks  FIM - Hotel manager Devices: Grab bar or rail for support Toileting: 7: Independent: No helper, no device  FIM - Diplomatic Services operational officer Devices: Grab bars Toilet Transfers: 3-To toilet/BSC: Mod A (lift or lower assist);3-From toilet/BSC: Mod A (lift or lower assist)  FIM - Bed/Chair Transfer Bed/Chair Transfer Assistive Devices: Bed rails Bed/Chair Transfer: 5: Supine > Sit: Supervision (verbal cues/safety issues);4: Sit > Supine: Min A (steadying pt. > 75%/lift 1 leg);4: Bed > Chair or W/C: Min A (steadying Pt. > 75%);4: Chair or W/C > Bed: Min A (steadying Pt. > 75%)  FIM - Locomotion: Wheelchair Distance: 100 Locomotion: Wheelchair: 2: Travels 50 - 149 ft with moderate assistance (Pt: 50 -  74%) FIM - Locomotion: Ambulation Locomotion: Ambulation Assistive Devices: Other (comment) (HHA) Ambulation/Gait Assistance: 3: Mod assist Locomotion: Ambulation: 1: Travels less than 50 ft with moderate assistance (Pt: 50 - 74%)  Medical Problem List and Plan:  1. 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 DVT Prophylaxis/Anticoagulation: Right upper extremity DVT. Continue Coumadin therapy. Monitor platelet counts any signs of bleeding  3. pain management. Oxycodone 10 mg every 6 hours as needed  4. Mood: Amantadine 100 mg 2 times a day, Klonopin0.5 mg twice a day, wean off, Inderal 30 mg every 12 hours, Ativan 1 mg every 4 as needed anxiety.t  Continue to monitor and modify behavior program,   -celexa qhs Agitation at noc increased Seroquel to 50mg  5. Neuropsych: This patient is not capable of making decisions on his/her own behalf.  6. Multiple facial fractures. Conservative care per ENT Dr. Emeline Darling  7. L1 transverse process fracture. Conservative care,   scheduled oxycodone  8. VDRF/tracheostomy.   Continue PMV at all times. May just decannulate from #6 XLT with PMV--dw pulmonary  -it has been as fiasco with the trach situation honestly, was told there is  no #4XLT.    -no secretions, voice quality good, alert, etc  -no activity olerance issues, 9. Gastrostomy tube 07/03/2012. Continue nutritional support as directed.   On  D1/nectar diet --eating well  -check labs today 10. Bilateral pneumothorax. Resolved and chest tubes removed  11. MRSA tracheobronchitis. Vancomycin completed after 14 days. Afebrile, no cough  -pulmonary mentions abx course. i can't see where that it's indicated at this time  -continue close observation for fever, sx 12. History diabetes mellitus. Continue Lantus insulin and Glucophage as directed. Check blood sugars routine   -observe as we transition to diet 13. Urinary retention.Flomax 0.8 mg daily. Patient currently has a  condom catheter. Normal PVRs x3.  14.  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    R ulnar fx -cast - further separation on follow up xray    -ortho reviewed films-ordered replacement of SAC  -due to cognitive issues previously he wasn't protecting the hand either--will allow for use of hand for feeding and simple hygiene but would try to be cautions with wb activities  -pt has better awareness now to protect the hand  LOS (Days) 18 A FACE TO FACE EVALUATION WAS PERFORMED  Bernie Ransford T 08/26/2012, 7:39 AM

## 2012-08-26 NOTE — Progress Notes (Signed)
ANTICOAGULATION CONSULT NOTE - Follow Up Consult  Pharmacy Consult:  Coumadin Indication:  RUE DVT  Allergies  Allergen Reactions  . Penicillins    Patient Measurements: Weight: 207 lb 0.2 oz (93.9 kg)  Vital Signs: Temp: 96.6 F (35.9 C) (11/25 0500) BP: 178/85 mmHg (11/25 0500) Pulse Rate: 63  (11/25 0500)  Labs:  Basename 08/26/12 0759 08/25/12 0548 08/24/12 0420  HGB -- -- 10.3*  HCT -- -- 32.4*  PLT -- -- 178  APTT -- -- --  LABPROT 21.6* 22.2* 23.7*  INR 1.96* 2.04* 2.23*  HEPARINUNFRC -- -- --  CREATININE 1.01 -- 1.03  CKTOTAL -- -- --  CKMB -- -- --  TROPONINI -- -- --   Assessment: 43 YOM admitted to rehab s/p TBI / multi-trauma with hemorrhagic contusion to the head.  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  He developed and upper extremity DVT and is now continued on Coumadin while he is being cared for in the rehab unit.  INR dropped slightly subtherapeutic. No CBC today, No bleeding noted.  Goal of Therapy:  INR 2 - 3 (aim for 2 - 2.5 d/t recent TBI with [facility])   Plan:  1. Coumadin 7.5mg  PO x 1 tonight 2. F/u AM INR  Lysle Pearl, PharmD, BCPS Pager # [phone] 08/26/2012 10:42 AM

## 2012-08-26 NOTE — Progress Notes (Signed)
Occupational Therapy Session Note  Patient Details  Name: Alex Foster MRN: [id number] Date of Birth: [birth year]  Today's Date: 08/26/2012 Time: 0900-0955 Time Calculation (min): 55 min  Short Term Goals: Week 2:  OT Short Term Goal 1 (Week 2): Pt would don shirt with mod A  OT Short Term Goal 1 - Progress (Week 2): Progressing toward goal OT Short Term Goal 2 (Week 2): Pt would maintain sitting balance for 5 min with supervision during functional tasks OT Short Term Goal 2 - Progress (Week 2): Met OT Short Term Goal 3 (Week 2): Pt will be oriented to self, place and month with min environmental cues OT Short Term Goal 3 - Progress (Week 2): Met OT Short Term Goal 4 (Week 2): Pt would transfer with min A to toilet/BSC OT Short Term Goal 4 - Progress (Week 2): Met  Skilled Therapeutic Interventions/Progress Updates:    1:1 self care retraining at shower level with focus on use of left UE active assist with min to mod A, stand step pivot transfers, frustration tolerance,  recall of successful methods for dressing (hemi method), standing balance, sit to stands without UE support to maintain non weight bearing through right hand. Pt with excellent recall of weekend events and events of the current day without Assistance. Provided pt with hemi lap tray on left for better UE support and for increased awareness of UE.  Therapy Documentation Precautions:  Precautions Precautions: Fall Precaution Comments: L shoulder subluxation and L side hemiparesis. Peg tube, trach , right wrist fracture: in a short arm cast Restrictions Weight Bearing Restrictions: No General:   Vital Signs:   Pain:   ADL:   Exercises:   Other Treatments:    See FIM for current functional status  Therapy/Group: Individual Therapy  [name] [facility] 08/26/2012, 10:00 AM

## 2012-08-26 NOTE — Progress Notes (Signed)
Speech Language Pathology Session & Weekly Progress Notes  Patient Details  Name: Alex Foster MRN: [id number] Date of Birth: [birth year]  Today's Date: 08/26/2012 Time: 1130-1225 Time Calculation (min): 55 min  Short Term Goals: Week 2: SLP Short Term Goal 1 (Week 2): Pt will follow 2 step commands with 50% accuracy with Mod verbal cues  SLP Short Term Goal 1 - Progress (Week 2): Met SLP Short Term Goal 2 (Week 2): Pt will demonstrate selective attention in a midly distracting enviornemnt for ~10 minutes with Mod A verbal cues for redirection SLP Short Term Goal 2 - Progress (Week 2): Met SLP Short Term Goal 3 (Week 2): Pt will demonstrate intellectual awareness of person, place, time and situation with Mod A multimodal cueing SLP Short Term Goal 3 - Progress (Week 2): Met SLP Short Term Goal 4 (Week 2): Pt will consume Dys. 1 textures and nectar-thick liquids with Min A verbal cues for utilization of swallowing compensatory strategies.  SLP Short Term Goal 4 - Progress (Week 2): Met SLP Short Term Goal 5 (Week 2): Pt will demonstrate functional problem solving for familiar tasks with Mod A multimodal cueing  SLP Short Term Goal 5 - Progress (Week 2): Met  New Short Term Goals: Week 3: SLP Short Term Goal 1 (Week 3): Pt will demonstrate problem solving for mildly complex tasks with Mod A verbal cues.  SLP Short Term Goal 2 (Week 3): Pt will demonstrate alternating attention between two functional tasks with Mod A verbal cues.  SLP Short Term Goal 3 (Week 3): Pt will demonstrate recall new, daily information with supervision verbal cues.  SLP Short Term Goal 4 (Week 3): Pt will consume Dys. 2 textures and nectar-thick liquids with supervision verbal cues for utilization of compensatory strategies.   Weekly Progress Updates: Pt has met 5 out of 5 STG's this reporting period. Currently, pt is demonstrating behaviors consistent with a Rancho Level VII and is overall Min A for functional  problem solving, working memory, selective attention and emergent awareness. Pt decannulated today and is consuming Dys. 2 textures with nectar-thick liquids with Min A verbal cues for utilization of swallowing compensatory strategies.  Pt would benefit from continued skilled SLP intervention to maximize swallowing function and cognitive recovery and overall independence.    SLP Frequency: 1-2 X/day, 30-60 minutes Estimated Length of Stay: 1 week SLP Treatment/Interventions: Cognitive remediation/compensation;Cueing hierarchy;Dysphagia/aspiration precaution training;Environmental controls;Functional tasks;Internal/external aids;Patient/family education;Therapeutic Activities  Daily Session Skilled Therapeutic Intervention: Treatment focus on dysphagia goals. Pt consumed a meal of Dys. 2 textures and nectar-thick liquids with cough X 1. Pt required Min verbal cues to utilize small bites and slow pace. Recommend Dys. 2 textures with nectar-thick liquids with full supervision.  FIM:  Comprehension Comprehension Mode: Auditory Comprehension: 5-Understands complex 90% of the time/Cues < 10% of the time Expression Expression Mode: Verbal Expression: 5-Expresses complex 90% of the time/cues < 10% of the time Social Interaction Social Interaction: 5-Interacts appropriately 90% of the time - Needs monitoring or encouragement for participation or interaction. Problem Solving Problem Solving: 5-Solves basic 90% of the time/requires cueing < 10% of the time Memory Memory: 5-Recognizes or recalls 90% of the time/requires cueing < 10% of the time FIM - Eating Eating Activity: 5: Needs verbal cues/supervision Pain Pain Assessment Pain Assessment: 0-10 Pain Score:   1  Therapy/Group: Individual Therapy  Deward Sebek 08/26/2012, 4:25 PM

## 2012-08-26 NOTE — Progress Notes (Signed)
decanulated pt per Dr Molli Knock with difficulty pt able to cough and clear secretions dressing applied

## 2012-08-26 NOTE — Progress Notes (Signed)
Physical Therapy Weekly Progress Note  Patient Details  Name: Alex Foster MRN: [id number] Date of Birth: 19-Nov-1945  Today's Date: 08/26/2012  Patient has met 2 of 3 short term goals.  Pt is currently min A with transfers and w/c mobility as well as attention and awareness.  Pt continues to require mod A for gait and mod-max A for dynamic standing balance tasks.  Pt limited by decreased use of B UEs, trunk weakness, L LE weakness, decreased coordination.  Patient will continue to benefit from skilled PT intervention to enhance overall performance with activity tolerance, balance, postural control, ability to compensate for deficits, functional use of  left upper extremity and left lower extremity, attention, awareness and coordination.  Patient progressing toward long term goals..  Continue plan of care.  PT Short Term Goals Week 2:  PT Short Term Goal 1 (Week 2): Pt will perform functional transfers with min A PT Short Term Goal 1 - Progress (Week 2): Met PT Short Term Goal 2 (Week 2): Pt will propel w/c with supervision 25' PT Short Term Goal 2 - Progress (Week 2): Progressing toward goal PT Short Term Goal 3 (Week 2): Pt will demo intellectual awareness during functional task with mod A PT Short Term Goal 3 - Progress (Week 2): Met PT Short Term Goal 4 (Week 2): Pt will gait with max A 10' in controlled environment Week 3:  PT Short Term Goal 1 (Week 3): Pt will propel w/c with supervision 25' in controlled environment PT Short Term Goal 2 (Week 3): Pt will gait with mod A 25' in controlled environment  Skilled Therapeutic Interventions/Progress Updates:  Ambulation/gait training;Community reintegration;DME/adaptive equipment instruction;Neuromuscular re-education;Therapeutic Exercise;UE/LE Strength taining/ROM;Splinting/orthotics;Pain management;Discharge planning;Balance/vestibular training;Cognitive remediation/compensation;Functional mobility training;Therapeutic Activities;UE/LE  Coordination activities;Wheelchair propulsion/positioning;Stair training;Patient/family education    See FIM for current functional status   Alex Foster 08/26/2012, 7:58 AM

## 2012-08-26 NOTE — Progress Notes (Signed)
Pts trach d/c'd by RT;  Tolerated well.  Sat's spot checked; 92-96%  on RA;occlusive dressing placed; stoma pink. Carlean Purl

## 2012-08-26 NOTE — Progress Notes (Signed)
Physical Therapy Note  Patient Details  Name: Gilmer Kaminsky MRN: [id number] Date of Birth: [birth year] Today's Date: 08/26/2012  Time: 1415-1448 33 minutes Co-tx with TR  Pt c/o back and R UE pain at end of session, RN made aware.  Pt initially refused session due to fatigue, states "I can do some walking tomorrow".  Pt agreeable to play seated card game.  Pt participated in UNO card game with utilization of L UE with assist, min cues for problem solving and recall of directions.  Pt assisted to bed with min A for transfer, supervision bed mobility.  Individual therapy    Milad Bublitz 08/26/2012, 2:51 PM

## 2012-08-26 NOTE — Progress Notes (Addendum)
Recreational Therapy Session Note  Patient Details  Name: Johnthan Axtman MRN: [id number] Date of Birth: [birth year] Today's Date: 08/26/2012 Time: , 1035-1050; 9562-1308 Pain: c/o pain, un-rated, RN made aware Skilled Therapeutic Interventions/Progress Updates:   Co-treat with PT- pt ambulated using "Carley Hammed Walker"40-50 feet X 2 mod assist with max verbal cues to decrease leaning forward onto eva walker, +2 assist to control speed of the walker.  Pt required  one standing rest break due to low activity tolerance.  Pt seated in w/c upon entry to the room.  Trach removed by respiratory therapist prior to session.  Pt with thick secretions leaking out from under dressing covering the stoma.  Cleaned area with clean gauze and asked nursing to assess the dressing.  Nursing in to redress the area.   Pt agreeable to play seated card game using LUE with supervision-min assist, min cues for decreased memory (remembering the rules) and decreased vision. Therapy/Group: Co-Treatment Lemoyne Nestor 08/26/2012, 3:02 PM

## 2012-08-26 NOTE — Progress Notes (Signed)
Nutrition Follow-up  Intervention:   Ensure pudding bid Preferences obtained.   Double portion entree.  Assessment:   Eating Lunch with staff.  Trial of Dysphagia 2 Nectar thick today and is tolerating well.   Intake of 100% meals.  TF and prostat has been d/c'ed.  Protein and calorie needs remain  very high.    Diet Order:  Dysphagia 1 Nectar thick with full supervision, slow pace, small bites and sips Free Water:  100 ml bid per G tube  Meds: Scheduled Meds:   . amantadine  100 mg Per Tube BID  . antiseptic oral rinse  15 mL Mouth Rinse QID  . citalopram  10 mg Per Tube Daily  . clonazePAM  0.5 mg Oral QHS  . free water  100 mL Per Tube BID  . glycopyrrolate  1 mg Per Tube BID  . guaifenesin  300 mg Per Tube Q6H  . insulin aspart  0-5 Units Subcutaneous QHS  . insulin aspart  0-9 Units Subcutaneous TID WC  . metFORMIN  500 mg Oral BID WC  . neomycin-polymyxin-pramoxine   Topical BID  . pantoprazole sodium  40 mg Per Tube Q1200  . propranolol  30 mg Per Tube Q12H  . QUEtiapine  50 mg Oral QHS  . Tamsulosin HCl  0.8 mg Oral QHS  . [COMPLETED] warfarin  6 mg Oral ONCE-1800  . warfarin  7.5 mg Oral ONCE-1800  . Warfarin - Pharmacist Dosing Inpatient   Does not apply q1800   Continuous Infusions:  PRN Meds:.acetaminophen (TYLENOL) oral liquid 160 mg/5 mL, acetaminophen, albuterol, bisacodyl, guaifenesin, ipratropium, LORazepam, methocarbamol, ondansetron (ZOFRAN) IV, ondansetron, oxyCODONE, senna-docusate, sorbitol, traZODone   CMP     Component Value Date/Time   NA 137 08/26/2012 0759   K 4.4 08/26/2012 0759   CL 97 08/26/2012 0759   CO2 30 08/26/2012 0759   GLUCOSE 127* 08/26/2012 0759   BUN 19 08/26/2012 0759   CREATININE 1.01 08/26/2012 0759   CREATININE 1.15 03/13/2012 1043   CALCIUM 9.4 08/26/2012 0759   PROT 6.8 08/09/2012 0505   ALBUMIN 2.7* 08/09/2012 0505   AST 18 08/09/2012 0505   ALT 10 08/09/2012 0505   ALKPHOS 151* 08/09/2012 0505   BILITOT 0.5 08/09/2012  0505   GFRNONAA 77* 08/26/2012 0759   GFRAA 89* 08/26/2012 0759    CBG (last 3)   Basename 08/26/12 0812 08/25/12 2140 08/25/12 1631  GLUCAP 137* 134* 156*     Intake/Output Summary (Last 24 hours) at 08/26/12 1102 Last data filed at 08/26/12 0745  Gross per 24 hour  Intake    700 ml  Output   1150 ml  Net   -450 ml    Weight Status:  93.9 kg (11/15) no new weight  Re-estimated needs:  2340-2630 kcal, 130-150 gm protein  Nutrition Dx:  Increased nutrient needs related to brain injury and size AEB estimated needs.  Goal:  Tolerate diet with intake of meals and supplements to meet 100% estimated needs.  Monitor:  Oral intake of meals and supplements, labs, weight   Oran Rein, RD, LDN Clinical Inpatient Dietitian Pager:  [phone] Weekend and after hours pager:  [phone]

## 2012-08-26 NOTE — Progress Notes (Signed)
Occupational Therapy Session Note  Patient Details  Name: Alex Foster MRN: [id number] Date of Birth: [birth year]  Today's Date: 08/26/2012 Time: 4098-1191 Time Calculation (min): 45 min  Short Term Goals: Week 3:  OT Short Term Goal 1 (Week 3): Pt would perform sit to stand with ADL tasks with supervision with min cuing for no WB through right UE OT Short Term Goal 2 (Week 3): Pt would self feed with left Ue hand wtih min A (tryingto limit use of right UE) OT Short Term Goal 3 (Week 3): Pt will demonstrate emergent awareness wtih min A during functional activity OT Short Term Goal 4 (Week 3): Pt will perform toileting wtih mod A  Skilled Therapeutic Interventions/Progress Updates:    1:1 focus on functional ambulation with only 1 caregiver with bilateral Platform RW. Requires mod A steer and control RW but pt able to demonstrate increased upright posture and trunk control. Also focused on sit to stands without UE support, standing balance and active assist mobility with left UE (as much as he would tolerate) with focus on normal patterns of movement in shoulder flexion, shrugs, elbow flexion and extension against gravity, supination/ pronation and finger extension with tasks.   Therapy Documentation Precautions:  Precautions Precautions: Fall Precaution Comments: L shoulder subluxation and L side hemiparesis. Peg tube, trach , right wrist fracture: in a short arm cast Restrictions Weight Bearing Restrictions: No Pain:  after session pt c/o back pain- rest provided relief  See FIM for current functional status  Therapy/Group: Individual Therapy  [name] [facility] 08/26/2012, 2:42 PM

## 2012-08-27 ENCOUNTER — Inpatient Hospital Stay (HOSPITAL_COMMUNITY): Payer: Medicaid Other | Admitting: Physical Therapy

## 2012-08-27 ENCOUNTER — Inpatient Hospital Stay (HOSPITAL_COMMUNITY): Payer: Self-pay | Admitting: Occupational Therapy

## 2012-08-27 ENCOUNTER — Inpatient Hospital Stay (HOSPITAL_COMMUNITY): Payer: Medicaid Other | Admitting: Speech Pathology

## 2012-08-27 ENCOUNTER — Encounter (HOSPITAL_COMMUNITY): Payer: Self-pay | Admitting: Occupational Therapy

## 2012-08-27 ENCOUNTER — Inpatient Hospital Stay (HOSPITAL_COMMUNITY): Payer: Medicaid Other | Admitting: *Deleted

## 2012-08-27 LAB — GLUCOSE, CAPILLARY
Glucose-Capillary: 129 mg/dL — ABNORMAL HIGH (ref 70–99)
Glucose-Capillary: 109 mg/dL — ABNORMAL HIGH (ref 70–99)
Glucose-Capillary: 109 mg/dL — ABNORMAL HIGH (ref 70–99)
Glucose-Capillary: 228 mg/dL — ABNORMAL HIGH (ref 70–99)

## 2012-08-27 LAB — PROTIME-INR: INR: 2.18 — ABNORMAL HIGH (ref 0.00–1.49)

## 2012-08-27 MED ORDER — WARFARIN SODIUM 7.5 MG PO TABS
7.5000 mg | ORAL_TABLET | Freq: Once | ORAL | Status: AC
  Administered 2012-08-27: 7.5 mg via ORAL
  Filled 2012-08-27: qty 1

## 2012-08-27 NOTE — Progress Notes (Signed)
Decannulatd yesterday - speech OK, good cough, c/o pain in cast BL BS clear CVs- s1s2 nml  A/P: 66 year old with a tracheostomy after trauma. PCCM to sign off   Kathi Dohn V.

## 2012-08-27 NOTE — Progress Notes (Signed)
Speech Language Pathology Daily Session Note  Patient Details  Name: Alex Foster MRN: [id number] Date of Birth: [birth year]  Today's Date: 08/27/2012 Time: 1130-1225 Time Calculation (min): 55 min  Short Term Goals: Week 3: SLP Short Term Goal 1 (Week 3): Pt will demonstrate problem solving for mildly complex tasks with Mod A verbal cues.  SLP Short Term Goal 2 (Week 3): Pt will demonstrate alternating attention between two functional tasks with Mod A verbal cues.  SLP Short Term Goal 3 (Week 3): Pt will demonstrate recall new, daily information with supervision verbal cues.  SLP Short Term Goal 4 (Week 3): Pt will consume Dys. 2 textures and nectar-thick liquids with supervision verbal cues for utilization of compensatory strategies.   Skilled Therapeutic Interventions: Treatment focus on dysphagia and cognitive goals. Pt is currently on Dys. 2 textures, however his meal tray appeared to look more like Dys. 3 textures. Pt demonstrated efficient mastication of textures but demonstrated an intermittent wet vocal quality X 1 that cleared with a cued throat clear. Pt also demonstrated a cough X 2 throughout the meal. Pt independently asked to use the bathroom but required Min verbal cues for safety awareness with not utilizing RUE for weight bearing.    FIM:  Comprehension Comprehension Mode: Auditory Comprehension: 5-Understands complex 90% of the time/Cues < 10% of the time Expression Expression Mode: Verbal Expression: 5-Expresses complex 90% of the time/cues < 10% of the time Social Interaction Social Interaction: 5-Interacts appropriately 90% of the time - Needs monitoring or encouragement for participation or interaction. Problem Solving Problem Solving: 4-Solves basic 75 - 89% of the time/requires cueing 10 - 24% of the time Memory Memory: 5-Recognizes or recalls 90% of the time/requires cueing < 10% of the time FIM - Eating Eating Activity: 5: Needs verbal  cues/supervision  Pain Pain Assessment Pain Assessment: No/denies pain  Therapy/Group: Individual Therapy  Janaiah Vetrano 08/27/2012, 3:56 PM

## 2012-08-27 NOTE — Progress Notes (Signed)
Occupational Therapy Session Note  Patient Details  Name: Param Capri MRN: [id number] Date of Birth: [birth year]  Today's Date: 08/27/2012 Time: 1300-1330 Time Calculation (min): 30 min  Short Term Goals: Week 3:  OT Short Term Goal 1 (Week 3): Pt would perform sit to stand with ADL tasks with supervision with min cuing for no WB through right UE OT Short Term Goal 2 (Week 3): Pt would self feed with left Ue hand wtih min A (tryingto limit use of right UE) OT Short Term Goal 3 (Week 3): Pt will demonstrate emergent awareness wtih min A during functional activity OT Short Term Goal 4 (Week 3): Pt will perform toileting wtih mod A  Skilled Therapeutic Interventions/Progress Updates:    Neuro muscular reeducation with focus on normal patterns of movement of left UE in supine and sidelying: shoulder extension and flexion, scapular mobility, elbow flexion and extension, supination, pronation, fine motor coordination, bed mobility, transfers.   Therapy Documentation Precautions:  Precautions Precautions: Fall Precaution Comments: L shoulder subluxation and L side hemiparesis. Peg tube, trach , right wrist fracture: in a short arm cast Restrictions Weight Bearing Restrictions: No Pain:  mild c/o pain in left shoulder  See FIM for current functional status  Therapy/Group: Individual Therapy  [name] [facility] 08/27/2012, 2:24 PM

## 2012-08-27 NOTE — Progress Notes (Signed)
Physical Therapy Note  Patient Details  Name: Alex Foster MRN: [id number] Date of Birth: March 10, 1946 Today's Date: 08/27/2012  Time: 1330-1430 60 minutes Co-tx with TR  No c/o pain.  W/c mobility in controlled environment with min A.  Pt limited by back pain, unable to propel > 100' without rest.  Pt problem solved interaction with "[employer]" to schedule to get a new watch battery.  Pt requires min cuing to initiate and ask questions.  Attempt wood working activity, pt easily frustrated, unable to participate when frustrated.  Gait training 2 x 100', 1 x 150' with B PFRW with min A.  Pt with improved gait tolerance and balance, requires assist with turns and negotiating PFRW through obstacles.  Continues to be limited by poor frustration tolerance limiting participation in therapeutic activities.  Individual therapy   Jennika Ringgold 08/27/2012, 2:36 PM

## 2012-08-27 NOTE — Progress Notes (Signed)
Recreational Therapy Session Note  Patient Details  Name: Alex Foster MRN: [id number] Date of Birth: Feb 07, 1946 Today's Date: 08/27/2012 Time:  Pain: no c/o Skilled Therapeutic Interventions/Progress Updates: Pt propelled w/c on tile surfaces with Min assist, ptt limited by back pain, unable to propel > 100' without rest. Attempted wood working activity since pt's family indicated that he enjoyed woodworking , but pt easily frustrated and difficult to engage due to poor frustration tolerance.  Pt ambulated 2 x 100', 1 x 150' with B PFRW with min A. Pt with improved gait tolerance and balance, requires assist with turns and negotiating PFRW through obstacles. Continues to be limited by poor frustration tolerance limiting participation in therapeutic activities.   Therapy/Group: Co-Treatment   Elda Dunkerson 08/27/2012, 3:05 PM

## 2012-08-27 NOTE — Patient Care Conference (Signed)
Inpatient RehabilitationTeam Conference Note Date: 08/27/2012   Time: 3:00 PM    Patient Name: Alex Foster      Medical Record Number: [id number]  Date of Birth: [birth year] Sex: Male         Room/Bed: 4002/4002-01 Payor Info: Payor: MEDICAID [place]  Plan: MEDICAID [place] ACCESS  Product Type: *No Product type*     Admitting Diagnosis: TRAUMA TBI AND CVA  Admit Date/Time:  08/08/2012  3:19 PM Admission Comments: No comment available   Primary Diagnosis:  Trauma Principal Problem: Trauma  Patient Active Problem List   Diagnosis Date Noted  . Trauma 08/09/2012  . DM (diabetes mellitus) 08/03/2012  . DVT of axillary vein, acute right 08/03/2012  . Anticoagulation goal of INR 2 to 3 08/03/2012  . Hypertension   . Myocardial infarction   . Pleural effusion 06/09/2012  . Mobitz (type) II atrioventricular block 06/04/2012  . Acute respiratory failure with hypoxia 06/02/2012  . Cardiac arrest 06/02/2012  . Cardiogenic shock 06/02/2012  . Tracheobronchitis, MRSA 06/02/2012  . Leukocytosis 06/01/2012  . Lactic acidosis 06/01/2012  . Multiple rib fractures 05/31/2012  . 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 Extensive facial fractures 05/31/2012  . Leg erythema 05/11/2012  . Health care maintenance 02/29/2012  . Dizziness 07/18/2011  . Hepatic encephalopathy 04/12/2011  . Low back pain 03/29/2011  . Physical deconditioning 03/28/2011  . Thrombocytopenia 03/24/2011  . Anasarca 03/24/2011  . Knee pain, left 02/21/2011  . Constipation 11/22/2010  . OTHER DISORDERS OF BONE AND CARTILAGE OTHER 08/17/2010  . RBBB 02/03/2010  . INSOMNIA, CHRONIC 12/16/2009  . BENIGN POSITIONAL VERTIGO 08/10/2008  . HYPERLIPIDEMIA, CONTROLLED 12/04/2007  . DIABETES MELLITUS, TYPE II, WITHOUT COMPLICATIONS 08/23/2007  . TOBACCO ABUSE 05/08/2007  . HYPERTENSION, BENIGN ESSENTIAL, CONTROLLED 05/08/2007  . COPD 05/08/2007    Expected Discharge Date: Expected Discharge Date:  (SNF)  Team Members  Present: Physician: Dr. Faith Rogue Social Worker Present: Amada Jupiter, LCSW Nurse Present: Carlean Purl, RN PT Present: Reggy Eye, PT OT Present: Mackie Pai, Marye Round, OT;Ardis Rowan, COTA SLP Present: Feliberto Gottron, SLP Other (Discipline and Name): Tora Duck, PPS Coordinator and Bayard Hugger, RN     Current Status/Progress Goal Weekly Team Focus  Medical   trach out. right arm in [facility]. eating well  upgrade diet  stabilize for transfer   Bowel/Bladder   continent bowel and bladder/ verbalizing needs/ LBM 11/24  cont bowel and bladder  cont of bowel and bladder   Swallow/Nutrition/ Hydration   Dys. 2 textures with nectar thick liquids, Min A for utilization of strategies  Min A  trials of thin liquids   ADL's   transfers steady A, toileting mod to max A, bathing and dressing mod -max A, cognition overall supervisiont to min A  overall Mod A, minA transfers  activity tolerance, functional use of left UE, dynmaic standing balance, funcitonal ambulation with +1    Mobility   min A transfers, mod A gait  min A  balance, gait training   Communication   Supervision-Mod I  Min A  comprehension of complex information and multi-step commands   Safety/Cognition/ Behavioral Observations  Min A-Supervision  Min A  awareness, problem solving, atlernating attention    Pain   Oxy IR 10mg  PRN  Robaxin 500mg  PRN  /effective for pain control  free of pain  free of pain   Skin   peg site red tender, scattered bruising resolving  no new skin breakdown  no new skin breakdown, no  s/s of infection      *See Interdisciplinary Assessment and Plan and progress notes for long and short-term goals  Barriers to Discharge: safety awareness, ortho and neuro deficits    Possible Resolutions to Barriers:  full time supervision    Discharge Planning/Teaching Needs:  SNF      Team Discussion:  Continues to make excellent gains with trach now out and peg to be removed next 1-2  days.  SW actively working on SNF in [place]  Revisions to Treatment Plan:  None   Continued Need for Acute Rehabilitation Level of Care: The patient requires daily medical management by a physician with specialized training in physical medicine and rehabilitation for the following conditions: Daily direction of a multidisciplinary physical rehabilitation program to ensure safe treatment while eliciting the highest outcome that is of practical value to the patient.: Yes Daily medical management of patient stability for increased activity during participation in an intensive rehabilitation regime.: Yes Daily analysis of laboratory values and/or radiology reports with any subsequent need for medication adjustment of medical intervention for : Post surgical problems;Neurological problems;Other  Alex Foster 08/27/2012, 4:18 PM

## 2012-08-27 NOTE — Progress Notes (Signed)
ANTICOAGULATION CONSULT NOTE - Follow Up Consult  Pharmacy Consult:  Coumadin Indication:  RUE DVT  Allergies  Allergen Reactions  . Penicillins    Patient Measurements: Weight: 207 lb 0.2 oz (93.9 kg)  Vital Signs: Temp: 97.4 F (36.3 C) (11/26 0458) Temp src: Oral (11/26 0458) BP: 136/84 mmHg (11/26 0458) Pulse Rate: 77  (11/26 0458)  Labs:  Alvira Philips 08/27/12 1610 08/26/12 0759 08/25/12 0548  HGB -- -- --  HCT -- -- --  PLT -- -- --  APTT -- -- --  LABPROT 23.3* 21.6* 22.2*  INR 2.18* 1.96* 2.04*  HEPARINUNFRC -- -- --  CREATININE -- 1.01 --  CKTOTAL -- -- --  CKMB -- -- --  TROPONINI -- -- --   Assessment: 29 YOM admitted to rehab s/p TBI / multi-trauma with hemorrhagic contusion to the head.  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  He developed and upper extremity DVT and is now continued on Coumadin while he is being cared for in the rehab unit.  INR is now therapeutic at 2.18. No CBC today, No bleeding noted.  Goal of Therapy:  INR 2 - 3 (aim for 2 - 2.5 d/t recent TBI with [facility])   Plan:  1. Coumadin 7.5mg  PO x 1 tonight 2. F/u AM INR  Lysle Pearl, PharmD, BCPS Pager # [phone] 08/27/2012 10:35 AM

## 2012-08-27 NOTE — Progress Notes (Signed)
Orthopedic Tech Progress Note Patient Details:  Alex Foster 1946-03-31 [id number] Replacement cast other cast to tight. Patient ID: Alex Foster, male   DOB: [birth year], 66 y.o.   MRN: [id number]   Alex Foster 08/27/2012, 9:11 PM

## 2012-08-27 NOTE — Progress Notes (Signed)
Physical Therapy Note  Patient Details  Name: Nicolo Szablewski MRN: [id number] Date of Birth: 14-Jul-1946 Today's Date: 08/27/2012  Time: [phone] 55 minutes  No c/o pain.  W/c mobility with min A on unit, pt c/o fatigue with this activity, easily frustrated.  Gait training with B PFRW.  Pt requires min A for wt shifts and RW control.  Pt requires verbal cuing to stay close to walker, advancement of L LE and instructions for turning.  Pt requires mod cuing for safety when fatigued.  Stair training for LE strengthening step ups, tap ups with min steadying assist.  Pt easily frustrated by decreased coordination of L LE.  Toilet transfers with min A using grab bar, pt requires assist for clothing management.  Nu step for B LE strength and endurance 2 x 3 minutes level 4.  Pt limited this session by poor frustration tolerance, resistant to many ideas for increasing strength and balance.  Individual therapy   Eyonna Sandstrom 08/27/2012, 10:10 AM

## 2012-08-27 NOTE — Progress Notes (Signed)
Occupational Therapy Session Note  Patient Details  Name: Alex Foster MRN: [id number] Date of Birth: 04-29-1946  Today's Date: 08/27/2012 Time: 4098-1191 Time Calculation (min): 60 min  Short Term Goals: Week 3:  OT Short Term Goal 1 (Week 3): Pt would perform sit to stand with ADL tasks with supervision with min cuing for no WB through right UE OT Short Term Goal 2 (Week 3): Pt would self feed with left Ue hand wtih min A (tryingto limit use of right UE) OT Short Term Goal 3 (Week 3): Pt will demonstrate emergent awareness wtih min A during functional activity OT Short Term Goal 4 (Week 3): Pt will perform toileting wtih mod A  Skilled Therapeutic Interventions/Progress Updates:    1:1 self care retraining at shower level. Focus on functional ambulation with bilateral platform RW with one person Assisting. Pt was min A with straight navigation but mod A to turn with RW. Pt able to maintain upright posture without leaning on RW. Continued focus on using left Ue to assist with washing with min a and threading shirt and pants. Pt able to sit EOB to don clothing with only one LOB to the left but able to recover without A, demonstrating increase dynamic sitting balance.  Therapy Documentation Precautions:  Precautions Precautions: Fall Precaution Comments: L shoulder subluxation and L side hemiparesis. Peg tube, trach , right wrist fracture: in a short arm cast Restrictions Weight Bearing Restrictions: No Pain:  on going pain in back and right UE- RN aware and provided meds  See FIM for current functional status  Therapy/Group: Individual Therapy  [name] [facility] 08/27/2012, 12:13 PM

## 2012-08-27 NOTE — Progress Notes (Signed)
Patient ID: Alex Foster, male   DOB: 04/28/1946, 66 y.o.   MRN: [id number]  Subjective/Complaints:  No problems overnight. Bottom of cast is rubbing palm/fingers  Review of Systems  Unable to perform ROS: mental acuity   Objective: Vital Signs: Blood pressure 136/84, pulse 77, temperature 97.4 F (36.3 C), temperature source Oral, resp. rate 19, weight 93.9 kg (207 lb 0.2 oz), SpO2 98.00%. No results found. Results for orders placed during the hospital encounter of 08/08/12 (from the past 72 hour(s))  GLUCOSE, CAPILLARY     Status: Abnormal   Collection Time   08/24/12  8:03 AM      Component Value Range Comment   Glucose-Capillary 168 (*) 70 - 99 mg/dL   GLUCOSE, CAPILLARY     Status: Abnormal   Collection Time   08/24/12 11:38 AM      Component Value Range Comment   Glucose-Capillary 202 (*) 70 - 99 mg/dL   GLUCOSE, CAPILLARY     Status: Abnormal   Collection Time   08/24/12  4:08 PM      Component Value Range Comment   Glucose-Capillary 128 (*) 70 - 99 mg/dL   GLUCOSE, CAPILLARY     Status: Abnormal   Collection Time   08/24/12  9:39 PM      Component Value Range Comment   Glucose-Capillary 134 (*) 70 - 99 mg/dL    Comment 1 Notify RN     PROTIME-INR     Status: Abnormal   Collection Time   08/25/12  5:48 AM      Component Value Range Comment   Prothrombin Time 22.2 (*) 11.6 - 15.2 seconds    INR 2.04 (*) 0.00 - 1.49   GLUCOSE, CAPILLARY     Status: Abnormal   Collection Time   08/25/12  7:31 AM      Component Value Range Comment   Glucose-Capillary 176 (*) 70 - 99 mg/dL   GLUCOSE, CAPILLARY     Status: Abnormal   Collection Time   08/25/12 11:45 AM      Component Value Range Comment   Glucose-Capillary 198 (*) 70 - 99 mg/dL   GLUCOSE, CAPILLARY     Status: Abnormal   Collection Time   08/25/12  4:31 PM      Component Value Range Comment   Glucose-Capillary 156 (*) 70 - 99 mg/dL   URINALYSIS, ROUTINE W REFLEX MICROSCOPIC     Status: Abnormal   Collection  Time   08/25/12  7:00 PM      Component Value Range Comment   Color, Urine YELLOW  YELLOW    APPearance CLEAR  CLEAR    Specific Gravity, Urine 1.025  1.005 - 1.030    pH 7.0  5.0 - 8.0    Glucose, UA NEGATIVE  NEGATIVE mg/dL    Hgb urine dipstick NEGATIVE  NEGATIVE    Bilirubin Urine NEGATIVE  NEGATIVE    Ketones, ur NEGATIVE  NEGATIVE mg/dL    Protein, ur 30 (*) NEGATIVE mg/dL    Urobilinogen, UA 1.0  0.0 - 1.0 mg/dL    Nitrite NEGATIVE  NEGATIVE    Leukocytes, UA NEGATIVE  NEGATIVE   URINE MICROSCOPIC-ADD ON     Status: Normal   Collection Time   08/25/12  7:00 PM      Component Value Range Comment   Squamous Epithelial / LPF RARE  RARE    WBC, UA 0-2  <3 WBC/hpf    RBC / HPF 0-2  <3 RBC/hpf  GLUCOSE, CAPILLARY     Status: Abnormal   Collection Time   08/25/12  9:40 PM      Component Value Range Comment   Glucose-Capillary 134 (*) 70 - 99 mg/dL   PROTIME-INR     Status: Abnormal   Collection Time   08/26/12  7:59 AM      Component Value Range Comment   Prothrombin Time 21.6 (*) 11.6 - 15.2 seconds    INR 1.96 (*) 0.00 - 1.49   BASIC METABOLIC PANEL     Status: Abnormal   Collection Time   08/26/12  7:59 AM      Component Value Range Comment   Sodium 137  135 - 145 mEq/L    Potassium 4.4  3.5 - 5.1 mEq/L    Chloride 97  96 - 112 mEq/L    CO2 30  19 - 32 mEq/L    Glucose, Bld 127 (*) 70 - 99 mg/dL    BUN 19  6 - 23 mg/dL    Creatinine, Ser 0.98  0.50 - 1.35 mg/dL    Calcium 9.4  8.4 - 11.9 mg/dL    GFR calc non Af Amer 77 (*) >90 mL/min    GFR calc Af Amer 89 (*) >90 mL/min   GLUCOSE, CAPILLARY     Status: Abnormal   Collection Time   08/26/12  8:12 AM      Component Value Range Comment   Glucose-Capillary 137 (*) 70 - 99 mg/dL    Comment 1 Notify RN     GLUCOSE, CAPILLARY     Status: Abnormal   Collection Time   08/26/12 11:30 AM      Component Value Range Comment   Glucose-Capillary 128 (*) 70 - 99 mg/dL    Comment 1 Notify RN     GLUCOSE, CAPILLARY      Status: Abnormal   Collection Time   08/26/12  4:46 PM      Component Value Range Comment   Glucose-Capillary 124 (*) 70 - 99 mg/dL   GLUCOSE, CAPILLARY     Status: Abnormal   Collection Time   08/26/12  8:15 PM      Component Value Range Comment   Glucose-Capillary 157 (*) 70 - 99 mg/dL    Comment 1 Notify RN       HENT:  Patient with multiple healing abrasions.  Neck:  Tracheostomy tube in place,  No drainage. Able to speak with PMV with good vocal quality Cardiovascular: Normal rate and regular rhythm.  Pulmonary/Chest:  Decreased breath sounds at the bases coarse upper airway sounds still. No distress. Abdominal: Soft. He exhibits no distension.  Gastrostomy tube in place with some scant drainage around tube without odor.  Neurological: Reflex Scores:  Tricep reflexes are 2+ on the right side and 3+ on the left side.  Bicep reflexes are 2+ on the right side and 3+ on the left side.  Brachioradialis reflexes are 2+ on the right side and 3+ on the left side.  Patellar reflexes are 2+ on the right side and 3+ on the left side.  Achilles reflexes are 2+ on the right side and 3+ on the left side.  very alert, answers simple questions appropriately. Calls to use the bathroom. L field cut seems to be biggest visual issue at present. Exam more consistent. 3  /5 strength in the left deltoid bicep tricep and grip 4/5 in the right deltoid bicep tricep and grip 3 minus in the left hip extensor  knee extensor synergistic pattern and 4/5 in the right lower extremity     Assessment/Plan: 1. Functional deficits secondary to Severe TBI multitrauma which require 3+ hours per day of interdisciplinary therapy in a comprehensive inpatient rehab setting. Physiatrist is providing close team supervision and 24 hour management of active medical problems listed below. Physiatrist and rehab team continue to assess barriers to discharge/monitor patient progress toward functional and medical  goals.  Working toward stabilizing this patient for SNF transfer. He can go to SNF at beginning of next week. FIM: FIM - Bathing Bathing Steps Patient Completed: Chest;Abdomen;Front perineal area;Right upper leg;Left upper leg Bathing: 3: Mod-Patient completes 5-7 48f 10 parts or 50-74%  FIM - Upper Body Dressing/Undressing Upper body dressing/undressing steps patient completed: Thread/unthread right sleeve of pullover shirt/dresss;Thread/unthread left sleeve of pullover shirt/dress Upper body dressing/undressing: 3: Mod-Patient completed 50-74% of tasks FIM - Lower Body Dressing/Undressing Lower body dressing/undressing steps patient completed: Thread/unthread right underwear leg;Thread/unthread left underwear leg;Thread/unthread right pants leg;Thread/unthread left pants leg;Fasten/unfasten right shoe;Fasten/unfasten left shoe Lower body dressing/undressing: 3: Mod-Patient completed 50-74% of tasks  FIM - Hotel manager Devices: Grab bar or rail for support Toileting: 7: Independent: No helper, no device  FIM - Diplomatic Services operational officer Devices: Grab bars Toilet Transfers: 3-To toilet/BSC: Mod A (lift or lower assist);3-From toilet/BSC: Mod A (lift or lower assist)  FIM - Bed/Chair Transfer Bed/Chair Transfer Assistive Devices: Bed rails Bed/Chair Transfer: 4: Bed > Chair or W/C: Min A (steadying Pt. > 75%);4: Chair or W/C > Bed: Min A (steadying Pt. > 75%)  FIM - Locomotion: Wheelchair Distance: 100 Locomotion: Wheelchair: 2: Travels 50 - 149 ft with moderate assistance (Pt: 50 - 74%) FIM - Locomotion: Ambulation Locomotion: Ambulation Assistive Devices: Other (comment) (HHA) Ambulation/Gait Assistance: 3: Mod assist Locomotion: Ambulation: 1: Travels less than 50 ft with moderate assistance (Pt: 50 - 74%)  Medical Problem List and Plan:  1. 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 DVT  Prophylaxis/Anticoagulation: Right upper extremity DVT. Continue Coumadin therapy. Monitor platelet counts any signs of bleeding  3. pain management. Oxycodone 10 mg every 6 hours as needed  4. Mood: Amantadine 100 mg 2 times a day, Klonopin0.5 mg twice a day, wean off, Inderal 30 mg every 12 hours, Ativan 1 mg every 4 as needed anxiety.t  Continue to monitor and modify behavior program,   -celexa qhs Agitation at noc increased Seroquel to 50mg  5. Neuropsych: This patient is not capable of making decisions on his/her own behalf.  6. Multiple facial fractures. Conservative care per ENT Dr. Emeline Darling  7. L1 transverse process fracture. Conservative care,   scheduled oxycodone  8. VDRF/tracheostomy.     -trach out- doing very well! 9. Gastrostomy tube 07/03/2012. Continue nutritional support as directed.   On  D1/nectar diet --eating well  -check labs today 10. Bilateral pneumothorax. Resolved and chest tubes removed  11. MRSA tracheobronchitis. Vancomycin completed after 14 days. Afebrile, no cough  -pulmonary mentions abx course. i can't see where that it's indicated at this time  -continue close observation for fever, sx 12. History diabetes mellitus. Continue Lantus insulin and Glucophage as directed. Check blood sugars routine   -observe as we transition to diet 13. Urinary retention.Flomax 0.8 mg daily. Patient currently has a condom catheter. Normal PVRs x3.  14.  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    R ulnar fx -cast - further separation on follow up  xray    -ortho reviewed films-ordered replacement of SAC  -will ask orthotech to put padding on cast  -pt has better awareness now to protect the hand  LOS (Days) 19 A FACE TO FACE EVALUATION WAS PERFORMED  Eldoris Beiser T 08/27/2012, 7:06 AM

## 2012-08-28 ENCOUNTER — Inpatient Hospital Stay (HOSPITAL_COMMUNITY): Payer: Medicaid Other | Admitting: Physical Therapy

## 2012-08-28 ENCOUNTER — Inpatient Hospital Stay (HOSPITAL_COMMUNITY): Payer: Medicaid Other

## 2012-08-28 ENCOUNTER — Inpatient Hospital Stay (HOSPITAL_COMMUNITY): Payer: Medicaid Other | Admitting: Speech Pathology

## 2012-08-28 ENCOUNTER — Encounter (HOSPITAL_COMMUNITY): Payer: Self-pay | Admitting: Occupational Therapy

## 2012-08-28 DIAGNOSIS — S069X9A Unspecified intracranial injury with loss of consciousness of unspecified duration, initial encounter: Secondary | ICD-10-CM

## 2012-08-28 LAB — PROTIME-INR: INR: 2.38 — ABNORMAL HIGH (ref 0.00–1.49)

## 2012-08-28 LAB — GLUCOSE, CAPILLARY: Glucose-Capillary: 106 mg/dL — ABNORMAL HIGH (ref 70–99)

## 2012-08-28 MED ORDER — WARFARIN SODIUM 6 MG PO TABS
6.0000 mg | ORAL_TABLET | Freq: Once | ORAL | Status: AC
  Administered 2012-08-28: 6 mg via ORAL
  Filled 2012-08-28: qty 1
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
## 2012-08-28 NOTE — Progress Notes (Signed)
Speech Language Pathology Daily Session Note  Patient Details  Name: Alex Foster MRN: [id number] Date of Birth: 1946/04/25  Today's Date: 08/28/2012 Time: 4098-1191 Time Calculation (min): 60 min  Short Term Goals: Week 3: SLP Short Term Goal 1 (Week 3): Pt will demonstrate problem solving for mildly complex tasks with Mod A verbal cues.  SLP Short Term Goal 2 (Week 3): Pt will demonstrate alternating attention between two functional tasks with Mod A verbal cues.  SLP Short Term Goal 3 (Week 3): Pt will demonstrate recall new, daily information with supervision verbal cues.  SLP Short Term Goal 4 (Week 3): Pt will consume Dys. 2 textures and nectar-thick liquids with supervision verbal cues for utilization of compensatory strategies.   Skilled Therapeutic Interventions: Treatment focus on dysphagia and cognitive goals. Pt consumed ~6 oz of thin liquid and demonstrated overt s/s of aspiration with coughing X 3. Recommend to continue with nectar-thick liquids and trails of thin liquids with SLP only.  Pt participated in new learning task with focus on problem solving, frustration tolerance and utilization of LUE. Pt required Min verbal cues for problem solving with task and Min  encouragement for decreased frustration tolerance with task.    FIM:  Comprehension Comprehension Mode: Auditory Comprehension: 5-Understands complex 90% of the time/Cues < 10% of the time Expression Expression Mode: Verbal Expression: 5-Expresses complex 90% of the time/cues < 10% of the time Social Interaction Social Interaction: 5-Interacts appropriately 90% of the time - Needs monitoring or encouragement for participation or interaction. Problem Solving Problem Solving: 4-Solves basic 75 - 89% of the time/requires cueing 10 - 24% of the time Memory Memory: 5-Recognizes or recalls 90% of the time/requires cueing < 10% of the time FIM - Eating Eating Activity: 4: Help with managing cup/glass  Pain Pain  Assessment Pain Assessment: No/denies pain  Therapy/Group: Individual Therapy  [name] 08/28/2012, 12:07 PM

## 2012-08-28 NOTE — Progress Notes (Signed)
Physical Therapy Note  Patient Details  Name: Alex Foster MRN: [id number] Date of Birth: 05-Dec-1945 Today's Date: 08/28/2012  Time: 1400-1428 28 minutes  No c/o pain.  Gait training with B PFRW 2 x 100' with min A, manual facilitation for RW control and balance.  Attempt to teach pt squat pivot transfers to increase independence, pt frustrated and resistive, not open to problem solving or education.  Pt performed stand pivot transfer bed <> chair x 3 with min A.  Individual therapy   Earvin Blazier 08/28/2012, 2:29 PM

## 2012-08-28 NOTE — Progress Notes (Signed)
Occupational Therapy Note  Patient Details  Name: Alex Foster MRN: [id number] Date of Birth: [birth year] Today's Date: 08/28/2012  Session 1 Time: 1030-1055 Pt denies pain Individual Therapy Pt seated in w/c upon arrival and stated that he needed to use urinal.  Pt stood with PFRW and steady A while therapist positioned urinal for pt to void.  Pt unable to void.  Pt transitioned to gym to engage in LUE activities seated EOM and in supine.  Focus on increased functional use of LUE.  Pt exhibits increased frustration with inability to control movements of LUE and also not being able to use RUE secondary to cast and WBing restrictions   Session 2 Time: 1300-1355 Pt denies pain Individual Therapy Pt seated in w/c upon arrival but agreeable to participating in therapy.  Pt engaged in standing activity in kitchen incorporating increased use of LUE for functional tasks.  Pt verbalized frustration with inability to use LUE.  "I can't do this.  You'll have to do it."  Pt reluctantly continued with task with increased assistance from therapist.  Pt transitioned to gym to engage in seated tasks at table incorporating LUE use to grasp and release objects.  Pt again verbalized increasing frustration with inability to control LUE.  Pt returned to room at end of session and declined need to use bathroom.     Lavone Neri [facility] 08/28/2012, 2:38 PM

## 2012-08-28 NOTE — Progress Notes (Signed)
Social Work Patient ID: Alex Foster, male   DOB: 1946-08-03, 66 y.o.   MRN: [id number]  Patient and brothers aware that I am continueing to pursue SNF bed in [place] area.  Updated FL2 sent out.  Will keep team posted on bed offers.  Rameen Gohlke

## 2012-08-28 NOTE — Progress Notes (Signed)
Occupational Therapy Session Note  Patient Details  Name: Alex Foster MRN: [id number] Date of Birth: 1946-04-05  Today's Date: 08/28/2012 Time: 0815-0910 Time Calculation (min): 55 min  Short Term Goals: Week 3:  OT Short Term Goal 1 (Week 3): Pt would perform sit to stand with ADL tasks with supervision with min cuing for no WB through right UE OT Short Term Goal 2 (Week 3): Pt would self feed with left Ue hand wtih min A (tryingto limit use of right UE) OT Short Term Goal 3 (Week 3): Pt will demonstrate emergent awareness wtih min A during functional activity OT Short Term Goal 4 (Week 3): Pt will perform toileting wtih mod A  Skilled Therapeutic Interventions/Progress Updates:    1:1 self care retraining at shower level with focus on functional ambulation with platform RW, sit to stands without UE support, frustration tolerance, visual scanning and compensatory strategies for right hemianopsia with dressing to increase success, emergent and anticipatory awareness. Since pt with increased pain in right hand pt required increased assist today with bathing and dressing. Pt demonstrating increasing humor and attention in tasks.   Therapy Documentation Precautions:  Precautions Precautions: Fall Precaution Comments: L shoulder subluxation and L side hemiparesis. Peg tube, trach , right wrist fracture: in a short arm cast Restrictions Weight Bearing Restrictions: No Pain:  pt c/o increased right UE discomfort "from holding up so long yesterday when they were putting on the cast"  See FIM for current functional status  Therapy/Group: Individual Therapy  [name] [facility] 08/28/2012, 9:13 AM

## 2012-08-28 NOTE — Progress Notes (Signed)
Social Work Patient ID: [name], male   DOB: 11-06-1945, 66 y.o.   MRN: [id number]  Have left a message for pt's brother, Duffy Rhody, to review team conf and status of bed search. Unfortunately, I have received a decline from one (of two) facilities in [place] and am awaiting a reconsideration from the other.  Will send FL2 out in this area as well in anticipation that other facility will also decline.    Fern Canova

## 2012-08-28 NOTE — Progress Notes (Signed)
Physical Therapy Note  Patient Details  Name: Alex Foster MRN: [id number] Date of Birth: 12-May-1946 Today's Date: 08/28/2012  Time: 1100-1130 30 minutes  No c/o pain.  Gait training with B PFRW with min A.  Pt requires assist to turn and maneuver RW, easily frustrated requiring cuing for safety when fatigued.  Sit to stand training for increasing LE strength, min A 2 x 5 without use of UEs.  Standing balance without UE support x 60 seconds with close supervision.  Pt unwilling to participate in further balance training stating "i'm just going to fall".  Pt continues to be limited by low frustration tolerance throughout.  Individual therapy   DONAWERTH,KAREN 08/28/2012, 12:09 PM

## 2012-08-29 ENCOUNTER — Inpatient Hospital Stay (HOSPITAL_COMMUNITY): Payer: Self-pay | Admitting: Speech Pathology

## 2012-08-29 LAB — GLUCOSE, CAPILLARY
Glucose-Capillary: 111 mg/dL — ABNORMAL HIGH (ref 70–99)
Glucose-Capillary: 178 mg/dL — ABNORMAL HIGH (ref 70–99)
Glucose-Capillary: 132 mg/dL — ABNORMAL HIGH (ref 70–99)

## 2012-08-29 LAB — PROTIME-INR: INR: 2.46 — ABNORMAL HIGH (ref 0.00–1.49)

## 2012-08-29 MED ORDER — WARFARIN SODIUM 5 MG PO TABS
5.0000 mg | ORAL_TABLET | Freq: Once | ORAL | Status: AC
  Administered 2012-08-29: 5 mg via ORAL
  Filled 2012-08-29: qty 1

## 2012-08-29 NOTE — Progress Notes (Signed)
Patient ID: Alex Foster, male   DOB: 13-Dec-1945, 66 y.o.   MRN: [id number]  Subjective/Complaints:  Had a good night. No complaints this am  Review of Systems  Unable to perform ROS: mental acuity   Objective: Vital Signs: Blood pressure 160/80, pulse 60, temperature 97.9 F (36.6 C), temperature source Oral, resp. rate 18, weight 95 kg (209 lb 7 oz), SpO2 97.00%. No results found. Results for orders placed during the hospital encounter of 08/08/12 (from the past 72 hour(s))  PROTIME-INR     Status: Abnormal   Collection Time   08/26/12  7:59 AM      Component Value Range Comment   Prothrombin Time 21.6 (*) 11.6 - 15.2 seconds    INR 1.96 (*) 0.00 - 1.49   BASIC METABOLIC PANEL     Status: Abnormal   Collection Time   08/26/12  7:59 AM      Component Value Range Comment   Sodium 137  135 - 145 mEq/L    Potassium 4.4  3.5 - 5.1 mEq/L    Chloride 97  96 - 112 mEq/L    CO2 30  19 - 32 mEq/L    Glucose, Bld 127 (*) 70 - 99 mg/dL    BUN 19  6 - 23 mg/dL    Creatinine, Ser 7.82  0.50 - 1.35 mg/dL    Calcium 9.4  8.4 - 95.6 mg/dL    GFR calc non Af Amer 77 (*) >90 mL/min    GFR calc Af Amer 89 (*) >90 mL/min   GLUCOSE, CAPILLARY     Status: Abnormal   Collection Time   08/26/12  8:12 AM      Component Value Range Comment   Glucose-Capillary 137 (*) 70 - 99 mg/dL    Comment 1 Notify RN     GLUCOSE, CAPILLARY     Status: Abnormal   Collection Time   08/26/12 11:30 AM      Component Value Range Comment   Glucose-Capillary 128 (*) 70 - 99 mg/dL    Comment 1 Notify RN     GLUCOSE, CAPILLARY     Status: Abnormal   Collection Time   08/26/12  4:46 PM      Component Value Range Comment   Glucose-Capillary 124 (*) 70 - 99 mg/dL   GLUCOSE, CAPILLARY     Status: Abnormal   Collection Time   08/26/12  8:15 PM      Component Value Range Comment   Glucose-Capillary 157 (*) 70 - 99 mg/dL    Comment 1 Notify RN     GLUCOSE, CAPILLARY     Status: Abnormal   Collection Time   08/27/12 12:04 AM      Component Value Range Comment   Glucose-Capillary 129 (*) 70 - 99 mg/dL    Comment 1 Notify RN     PROTIME-INR     Status: Abnormal   Collection Time   08/27/12  6:35 AM      Component Value Range Comment   Prothrombin Time 23.3 (*) 11.6 - 15.2 seconds    INR 2.18 (*) 0.00 - 1.49   GLUCOSE, CAPILLARY     Status: Abnormal   Collection Time   08/27/12  7:35 AM      Component Value Range Comment   Glucose-Capillary 109 (*) 70 - 99 mg/dL    Comment 1 Notify RN     GLUCOSE, CAPILLARY     Status: Abnormal   Collection Time   08/27/12  11:20 AM      Component Value Range Comment   Glucose-Capillary 144 (*) 70 - 99 mg/dL    Comment 1 Notify RN     GLUCOSE, CAPILLARY     Status: Abnormal   Collection Time   08/27/12  4:24 PM      Component Value Range Comment   Glucose-Capillary 109 (*) 70 - 99 mg/dL    Comment 1 Notify RN     GLUCOSE, CAPILLARY     Status: Abnormal   Collection Time   08/27/12  9:08 PM      Component Value Range Comment   Glucose-Capillary 228 (*) 70 - 99 mg/dL    Comment 1 Notify RN     PROTIME-INR     Status: Abnormal   Collection Time   08/28/12  6:30 AM      Component Value Range Comment   Prothrombin Time 24.9 (*) 11.6 - 15.2 seconds    INR 2.38 (*) 0.00 - 1.49   GLUCOSE, CAPILLARY     Status: Abnormal   Collection Time   08/28/12  7:38 AM      Component Value Range Comment   Glucose-Capillary 106 (*) 70 - 99 mg/dL    Comment 1 Notify RN     GLUCOSE, CAPILLARY     Status: Abnormal   Collection Time   08/28/12 11:45 AM      Component Value Range Comment   Glucose-Capillary 141 (*) 70 - 99 mg/dL    Comment 1 Notify RN     GLUCOSE, CAPILLARY     Status: Abnormal   Collection Time   08/28/12  4:14 PM      Component Value Range Comment   Glucose-Capillary 136 (*) 70 - 99 mg/dL   GLUCOSE, CAPILLARY     Status: Abnormal   Collection Time   08/28/12  8:10 PM      Component Value Range Comment   Glucose-Capillary 181 (*) 70 - 99  mg/dL     HENT:  Patient with multiple healing abrasions.  Neck:  Tracheostomy tube in place,  No drainage. Able to speak with PMV with good vocal quality Cardiovascular: Normal rate and regular rhythm.  Pulmonary/Chest:  Decreased breath sounds at the bases coarse upper airway sounds still. No distress. Abdominal: Soft. He exhibits no distension.  Gastrostomy tube in place with some scant drainage around tube without odor.  Neurological: Reflex Scores:  Tricep reflexes are 2+ on the right side and 3+ on the left side.  Bicep reflexes are 2+ on the right side and 3+ on the left side.  Brachioradialis reflexes are 2+ on the right side and 3+ on the left side.  Patellar reflexes are 2+ on the right side and 3+ on the left side.  Achilles reflexes are 2+ on the right side and 3+ on the left side.  very alert, answers simple questions appropriately. Calls to use the bathroom. L field cut seems to be biggest visual issue at present. Exam more consistent. 4 /5 strength in the left deltoid bicep tricep and grip 4/5 in the right deltoid bicep tricep and grip 3 + to 4 in the left hip extensor knee extensor synergistic pattern and 4/5 in the right lower extremity     Assessment/Plan: 1. Functional deficits secondary to Severe TBI multitrauma which require 3+ hours per day of interdisciplinary therapy in a comprehensive inpatient rehab setting. Physiatrist is providing close team supervision and 24 hour management of active medical problems listed below.  Physiatrist and rehab team continue to assess barriers to discharge/monitor patient progress toward functional and medical goals.  Working toward stabilizing this patient for SNF transfer. He can go to SNF at beginning of next week. FIM: FIM - Bathing Bathing Steps Patient Completed: Chest;Abdomen;Front perineal area;Right upper leg;Left upper leg Bathing: 3: Mod-Patient completes 5-7 10f 10 parts or 50-74%  FIM - Upper Body  Dressing/Undressing Upper body dressing/undressing steps patient completed: Thread/unthread right sleeve of pullover shirt/dresss;Thread/unthread left sleeve of pullover shirt/dress Upper body dressing/undressing: 3: Mod-Patient completed 50-74% of tasks FIM - Lower Body Dressing/Undressing Lower body dressing/undressing steps patient completed: Thread/unthread right underwear leg;Thread/unthread left underwear leg;Thread/unthread right pants leg;Thread/unthread left pants leg;Fasten/unfasten right shoe;Fasten/unfasten left shoe Lower body dressing/undressing: 3: Mod-Patient completed 50-74% of tasks  FIM - Hotel manager Devices: Grab bar or rail for support Toileting: 7: Independent: No helper, no device  FIM - Diplomatic Services operational officer Devices: Grab bars Toilet Transfers: 3-To toilet/BSC: Mod A (lift or lower assist);3-From toilet/BSC: Mod A (lift or lower assist)  FIM - Bed/Chair Transfer Bed/Chair Transfer Assistive Devices: Bed rails Bed/Chair Transfer: 4: Bed > Chair or W/C: Min A (steadying Pt. > 75%);4: Chair or W/C > Bed: Min A (steadying Pt. > 75%)  FIM - Locomotion: Wheelchair Distance: 100 Locomotion: Wheelchair: 2: Travels 50 - 149 ft with minimal assistance (Pt.>75%) FIM - Locomotion: Ambulation Locomotion: Ambulation Assistive Devices: Other (comment) (HHA) Ambulation/Gait Assistance: 3: Mod assist Locomotion: Ambulation: 2: Travels 50 - 149 ft with minimal assistance (Pt.>75%)  Medical Problem List and Plan:  1. 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 DVT Prophylaxis/Anticoagulation: Right upper extremity DVT. Continue Coumadin therapy. Monitor platelet counts any signs of bleeding  3. pain management. Oxycodone 10 mg every 6 hours as needed  4. Mood: Amantadine 100 mg 2 times a day, Klonopin0.5 mg twice a day, wean off, Inderal 30 mg every 12 hours, Ativan 1 mg every 4 as needed anxiety.t  Continue  to monitor and modify behavior program,   -celexa qhs Agitation at noc increased Seroquel to 50mg  5. Neuropsych: This patient is not capable of making decisions on his/her own behalf.  6. Multiple facial fractures. Conservative care per ENT Dr. Emeline Darling  7. L1 transverse process fracture. Conservative care,   scheduled oxycodone  8. VDRF/tracheostomy.     -trach out- doing very well! 9. Gastrostomy tube 07/03/2012.  .   On  D2/nectar diet --eating well  -bmet ok. Eating quite well. Drinking adequate liquids  -consider G tube removal 10. Bilateral pneumothorax. Resolved and chest tubes removed  11. MRSA tracheobronchitis. Vancomycin completed after 14 days. Afebrile, no cough  -pulmonary mentions abx course. i can't see where that it's indicated at this time  -continue close observation for fever, sx 12. History diabetes mellitus. Continue Lantus insulin and Glucophage as directed. Check blood sugars routine   -observe as we transition to diet 13. Urinary retention.Flomax 0.8 mg daily. Patient currently has a condom catheter. Normal PVRs x3.  14.  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    R ulnar fx -cast - further separation on follow up xray    -ortho reviewed films-ordered replacement of SAC  -pt has better awareness now to protect the hand  LOS (Days) 21 A FACE TO FACE EVALUATION WAS PERFORMED  Cletis Muma T 08/29/2012, 5:23 AM

## 2012-08-29 NOTE — Progress Notes (Signed)
ANTICOAGULATION CONSULT NOTE - Follow Up Consult  Pharmacy Consult:  Coumadin Indication:  RUE DVT  Allergies  Allergen Reactions  . Penicillins    Patient Measurements: Weight: 209 lb 7 oz (95 kg)  Vital Signs: Temp: 97.8 F (36.6 C) (11/28 0639) Temp src: Oral (11/28 0639) BP: 174/68 mmHg (11/28 0700) Pulse Rate: 65  (11/28 0639)  Labs:  Basename 08/29/12 0645 08/28/12 0630 08/27/12 0635  HGB -- -- --  HCT -- -- --  PLT -- -- --  APTT -- -- --  LABPROT 25.5* 24.9* 23.3*  INR 2.46* 2.38* 2.18*  HEPARINUNFRC -- -- --  CREATININE -- -- --  CKTOTAL -- -- --  CKMB -- -- --  TROPONINI -- -- --   Assessment: 13 YOM admitted to rehab s/p TBI / multi-trauma with hemorrhagic contusion to the head.  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  He developed and upper extremity DVT and is now continued on Coumadin while he is being cared for in the rehab unit.  INR is now therapeutic at 2.46. Noted continues to rise steadily. No CBC today, No bleeding noted.  Goal of Therapy:  INR 2 - 3 (aim for 2 - 2.5 d/t recent TBI with [facility])   Plan:  1. Coumadin 5 mg PO x 1 tonight 2. F/u AM INR  Thanks, Layken Doenges K. Allena Katz, PharmD, BCPS.  Clinical Pharmacist Pager [phone]. 08/29/2012 10:11 AM

## 2012-08-30 ENCOUNTER — Inpatient Hospital Stay (HOSPITAL_COMMUNITY): Payer: Self-pay | Admitting: Occupational Therapy

## 2012-08-30 ENCOUNTER — Inpatient Hospital Stay (HOSPITAL_COMMUNITY): Payer: Medicaid Other | Admitting: *Deleted

## 2012-08-30 ENCOUNTER — Inpatient Hospital Stay (HOSPITAL_COMMUNITY): Payer: Medicaid Other | Admitting: Speech Pathology

## 2012-08-30 ENCOUNTER — Encounter (HOSPITAL_COMMUNITY): Payer: Self-pay | Admitting: Occupational Therapy

## 2012-08-30 LAB — URINALYSIS, ROUTINE W REFLEX MICROSCOPIC
Bilirubin Urine: NEGATIVE
Hgb urine dipstick: NEGATIVE
Ketones, ur: NEGATIVE mg/dL
Nitrite: NEGATIVE
Urobilinogen, UA: 1 mg/dL (ref 0.0–1.0)
pH: 6 (ref 5.0–8.0)

## 2012-08-30 LAB — GLUCOSE, CAPILLARY
Glucose-Capillary: 186 mg/dL — ABNORMAL HIGH (ref 70–99)
Glucose-Capillary: 197 mg/dL — ABNORMAL HIGH (ref 70–99)

## 2012-08-30 MED ORDER — WARFARIN SODIUM 6 MG PO TABS
6.0000 mg | ORAL_TABLET | Freq: Once | ORAL | Status: AC
  Administered 2012-08-30: 6 mg via ORAL
  Filled 2012-08-30: qty 1

## 2012-08-30 NOTE — Progress Notes (Signed)
ANTICOAGULATION CONSULT NOTE - Follow Up Consult  Pharmacy Consult:  Coumadin Indication:  RUE DVT  Allergies  Allergen Reactions  . Penicillins    Patient Measurements: Weight: 209 lb 7 oz (95 kg)  Vital Signs: Temp: 97.6 F (36.4 C) (11/29 0545) Temp src: Oral (11/29 0545) BP: 170/72 mmHg (11/29 0545) Pulse Rate: 62  (11/29 0545)  Labs:  Basename 08/30/12 0630 08/29/12 0645 08/28/12 0630  HGB -- -- --  HCT -- -- --  PLT -- -- --  APTT -- -- --  LABPROT 23.6* 25.5* 24.9*  INR 2.21* 2.46* 2.38*  HEPARINUNFRC -- -- --  CREATININE -- -- --  CKTOTAL -- -- --  CKMB -- -- --  TROPONINI -- -- --   Assessment: 66 YOM admitted to rehab s/p TBI / multi-trauma with hemorrhagic contusion to the head.  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  He developed and upper extremity DVT and is now continued on Coumadin while he is being cared for in the rehab unit.  INR remains therapeutic at 2.21. No CBC today, No bleeding noted.  Goal of Therapy:  INR 2 - 3 (aim for 2 - 2.5 d/t recent TBI with [facility])   Plan:  1. Coumadin 6 mg PO x 1 tonight 2. F/u AM INR  Lysle Pearl, PharmD, BCPS Pager # [phone] 08/30/2012 9:16 AM

## 2012-08-30 NOTE — Progress Notes (Signed)
Patient ID: Alex Foster, male   DOB: 10-12-1945, 66 y.o.   MRN: [id number]  Subjective/Complaints:  complains of some constipation and occasional urine retention    Review of Systems  Unable to perform ROS: mental acuity   Objective: Vital Signs: Blood pressure 170/72, pulse 62, temperature 97.6 F (36.4 C), temperature source Oral, resp. rate 18, weight 95 kg (209 lb 7 oz), SpO2 95.00%. No results found. Results for orders placed during the hospital encounter of 08/08/12 (from the past 72 hour(s))  GLUCOSE, CAPILLARY     Status: Abnormal   Collection Time   08/27/12  7:35 AM      Component Value Range Comment   Glucose-Capillary 109 (*) 70 - 99 mg/dL    Comment 1 Notify RN     GLUCOSE, CAPILLARY     Status: Abnormal   Collection Time   08/27/12 11:20 AM      Component Value Range Comment   Glucose-Capillary 144 (*) 70 - 99 mg/dL    Comment 1 Notify RN     GLUCOSE, CAPILLARY     Status: Abnormal   Collection Time   08/27/12  4:24 PM      Component Value Range Comment   Glucose-Capillary 109 (*) 70 - 99 mg/dL    Comment 1 Notify RN     GLUCOSE, CAPILLARY     Status: Abnormal   Collection Time   08/27/12  9:08 PM      Component Value Range Comment   Glucose-Capillary 228 (*) 70 - 99 mg/dL    Comment 1 Notify RN     PROTIME-INR     Status: Abnormal   Collection Time   08/28/12  6:30 AM      Component Value Range Comment   Prothrombin Time 24.9 (*) 11.6 - 15.2 seconds    INR 2.38 (*) 0.00 - 1.49   GLUCOSE, CAPILLARY     Status: Abnormal   Collection Time   08/28/12  7:38 AM      Component Value Range Comment   Glucose-Capillary 106 (*) 70 - 99 mg/dL    Comment 1 Notify RN     GLUCOSE, CAPILLARY     Status: Abnormal   Collection Time   08/28/12 11:45 AM      Component Value Range Comment   Glucose-Capillary 141 (*) 70 - 99 mg/dL    Comment 1 Notify RN     GLUCOSE, CAPILLARY     Status: Abnormal   Collection Time   08/28/12  4:14 PM      Component Value Range  Comment   Glucose-Capillary 136 (*) 70 - 99 mg/dL   GLUCOSE, CAPILLARY     Status: Abnormal   Collection Time   08/28/12  8:10 PM      Component Value Range Comment   Glucose-Capillary 181 (*) 70 - 99 mg/dL   PROTIME-INR     Status: Abnormal   Collection Time   08/29/12  6:45 AM      Component Value Range Comment   Prothrombin Time 25.5 (*) 11.6 - 15.2 seconds    INR 2.46 (*) 0.00 - 1.49   GLUCOSE, CAPILLARY     Status: Abnormal   Collection Time   08/29/12  7:22 AM      Component Value Range Comment   Glucose-Capillary 111 (*) 70 - 99 mg/dL    Comment 1 Notify RN     GLUCOSE, CAPILLARY     Status: Abnormal   Collection Time  08/29/12 11:22 AM      Component Value Range Comment   Glucose-Capillary 178 (*) 70 - 99 mg/dL    Comment 1 Notify RN     GLUCOSE, CAPILLARY     Status: Abnormal   Collection Time   08/29/12  3:57 PM      Component Value Range Comment   Glucose-Capillary 132 (*) 70 - 99 mg/dL   GLUCOSE, CAPILLARY     Status: Abnormal   Collection Time   08/29/12  8:57 PM      Component Value Range Comment   Glucose-Capillary 162 (*) 70 - 99 mg/dL    Comment 1 Notify RN     PROTIME-INR     Status: Abnormal   Collection Time   08/30/12  6:30 AM      Component Value Range Comment   Prothrombin Time 23.6 (*) 11.6 - 15.2 seconds    INR 2.21 (*) 0.00 - 1.49   GLUCOSE, CAPILLARY     Status: Abnormal   Collection Time   08/30/12  7:18 AM      Component Value Range Comment   Glucose-Capillary 147 (*) 70 - 99 mg/dL    Comment 1 Notify RN       HENT:  Patient with multiple healing abrasions.  Neck:  Tracheostomy tube in place,  No drainage. Able to speak with PMV with good vocal quality Cardiovascular: Normal rate and regular rhythm.  Pulmonary/Chest:  Decreased breath sounds at the bases coarse upper airway sounds still. No distress. Abdominal: Soft. He exhibits no distension.  Gastrostomy tube in place with some scant drainage around tube without odor.    Neurological: Reflex Scores:  Tricep reflexes are 2+ on the right side and 3+ on the left side.  Bicep reflexes are 2+ on the right side and 3+ on the left side.  Brachioradialis reflexes are 2+ on the right side and 3+ on the left side.  Patellar reflexes are 2+ on the right side and 3+ on the left side.  Achilles reflexes are 2+ on the right side and 3+ on the left side.  very alert, answers simple questions appropriately. Calls to use the bathroom. L field cut seems to be biggest visual issue at present. Exam more consistent. 4 /5 strength in the left deltoid bicep tricep and grip 4/5 in the right deltoid bicep tricep and grip 3 + to 4 in the left hip extensor knee extensor synergistic pattern and 4/5 in the right lower extremity     Assessment/Plan: 1. Functional deficits secondary to Severe TBI multitrauma which require 3+ hours per day of interdisciplinary therapy in a comprehensive inpatient rehab setting. Physiatrist is providing close team supervision and 24 hour management of active medical problems listed below. Physiatrist and rehab team continue to assess barriers to discharge/monitor patient progress toward functional and medical goals.  Placement pending  FIM: FIM - Bathing Bathing Steps Patient Completed: Chest;Abdomen;Front perineal area;Right upper leg;Left upper leg Bathing: 3: Mod-Patient completes 5-7 38f 10 parts or 50-74%  FIM - Upper Body Dressing/Undressing Upper body dressing/undressing steps patient completed: Thread/unthread right sleeve of pullover shirt/dresss;Thread/unthread left sleeve of pullover shirt/dress Upper body dressing/undressing: 3: Mod-Patient completed 50-74% of tasks FIM - Lower Body Dressing/Undressing Lower body dressing/undressing steps patient completed: Thread/unthread right underwear leg;Thread/unthread left underwear leg;Thread/unthread right pants leg;Thread/unthread left pants leg;Fasten/unfasten right shoe;Fasten/unfasten left  shoe Lower body dressing/undressing: 3: Mod-Patient completed 50-74% of tasks  FIM - Hotel manager Devices: Grab bar or rail for support  Toileting: 7: Independent: No helper, no device  FIM - Diplomatic Services operational officer Devices: Grab bars Toilet Transfers: 3-To toilet/BSC: Mod A (lift or lower assist);3-From toilet/BSC: Mod A (lift or lower assist)  FIM - Bed/Chair Transfer Bed/Chair Transfer Assistive Devices: Bed rails Bed/Chair Transfer: 4: Bed > Chair or W/C: Min A (steadying Pt. > 75%);4: Chair or W/C > Bed: Min A (steadying Pt. > 75%)  FIM - Locomotion: Wheelchair Distance: 100 Locomotion: Wheelchair: 2: Travels 50 - 149 ft with minimal assistance (Pt.>75%) FIM - Locomotion: Ambulation Locomotion: Ambulation Assistive Devices: Other (comment) (HHA) Ambulation/Gait Assistance: 3: Mod assist Locomotion: Ambulation: 2: Travels 50 - 149 ft with minimal assistance (Pt.>75%)  Medical Problem List and Plan:  1. 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 DVT Prophylaxis/Anticoagulation: Right upper extremity DVT. Continue Coumadin therapy. Monitor platelet counts any signs of bleeding  3. pain management. Oxycodone 10 mg every 6 hours as needed  4. Mood: Amantadine 100 mg 2 times a day, Klonopin0.5 mg twice a day, wean off, Inderal 30 mg every 12 hours, Ativan 1 mg every 4 as needed anxiety.t  Continue to monitor and modify behavior program,   -celexa qhs Agitation at noc increased Seroquel to 50mg  5. Neuropsych: This patient is not capable of making decisions on his/her own behalf.  6. Multiple facial fractures. Conservative care per ENT Dr. Emeline Darling  7. L1 transverse process fracture. Conservative care,   scheduled oxycodone  8. VDRF/tracheostomy.     -trach out- site closing 9. Gastrostomy tube 07/03/2012.  .   On  D2/nectar diet --eating well  -bmet ok. Eating quite well. Drinking adequate liquids  -probably will  leave in G-tube until he's on regular liquids 10. Bilateral pneumothorax. Resolved and chest tubes removed  11. MRSA tracheobronchitis. Vancomycin completed after 14 days. Afebrile, no cough  -pulmonary mentions abx course. i can't see where that it's indicated at this time  -continue close observation for fever, sx 12. History diabetes mellitus. Continue Lantus insulin and Glucophage as directed. Check blood sugars routine   -observe as we transition to diet 13. Urinary retention.Flomax 0.8 mg daily. Patient currently has a condom catheter. Normal PVRs x3.   -odor-check ua and culture 14.  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    R ulnar fx -cast - further separation on follow up xray    -ortho reviewed films-ordered replacement of SAC  -pt has better awareness now to protect the hand  LOS (Days) 22 A FACE TO FACE EVALUATION WAS PERFORMED  Collan Schoenfeld T 08/30/2012, 7:26 AM

## 2012-08-30 NOTE — Progress Notes (Addendum)
Occupational Therapy Weekly Progress Note  Patient Details  Name: Alex Foster MRN: [id number] Date of Birth: January 17, 1946  Today's Date: 08/30/2012 Time: 1000-1055 Time Calculation (min): 55 min  Patient has met 4 of 4 short term goals.  Pt continues to make great progress with therapy. Pt has had trach removed and is on a consistent diet and can feed himself with setup and a supportive environment. Pt can bathe and dress  with mod to max A due to casted right (dominant) UE. Pt can perform sit to stands with supervision to min A with cuing for successful setup, functional ambulation with bilateral platform RW with min A (mod A in tight environment requiring a lot of turns). Pt continent of bowel and bladder. Pt is getting increasing return in left UE Brunnstrom IV, however has a weak shoulder/ scapular region impacting normal patterns of movement.  Pt's behavior are consistent with [place] Level VII.  Patient continues to demonstrate the following deficits:muscle weakness,  impaired timing and sequencing, abnormal tone, unbalanced muscle activation, decreased coordination and decreased motor planning, decreased visual acuity, decreased visual perceptual skills, decreased visual motor skills and hemianopsia, decreased awareness and decreased problem solving and decreased standing balance, decreased postural control, hemiplegia, decreased balance strategies and difficulty maintaining precautionsand therefore will continue to benefit from skilled OT intervention to enhance overall performance with BADL and Reduce care partner burden.  Patient progressing toward long term goals..  Continue plan of care.  OT Short Term Goals Week 3:  OT Short Term Goal 1 (Week 3): Pt would perform sit to stand with ADL tasks with supervision with min cuing for no WB through right UE OT Short Term Goal 1 - Progress (Week 3): Met OT Short Term Goal 2 (Week 3): Pt would self feed with left Ue hand wtih min A (tryingto  limit use of right UE) OT Short Term Goal 2 - Progress (Week 3): Met OT Short Term Goal 3 (Week 3): Pt will demonstrate emergent awareness wtih min A during functional activity OT Short Term Goal 3 - Progress (Week 3): Met OT Short Term Goal 4 (Week 3): Pt will perform toileting wtih mod A OT Short Term Goal 4 - Progress (Week 3): Met Week 4:  OT Short Term Goal 1 (Week 4): Pt will don shirt wtih Mod A OT Short Term Goal 2 (Week 4): Pt will perform LB dressing wtih mod A OT Short Term Goal 3 (Week 4): Pt will perform grooming when not in pain with min A OT Short Term Goal 4 (Week 4): Pt will demonstrate overall memory with min A  Skilled Therapeutic Interventions/Progress Updates:    continue with POC  Therapy Documentation Precautions:  Precautions Precautions: Fall Precaution Comments: L shoulder subluxation and L side hemiparesis. Peg tube, trach , right wrist fracture: in a short arm cast Required Braces or Orthoses: Other Brace/Splint Other Brace/Splint: short arm cast Restrictions Weight Bearing Restrictions: Yes RUE Weight Bearing: Non weight bearing Pain: Pain Assessment Pain Assessment: 0-10 Pain Score:   4 Pain Type: Acute pain Pain Location: Back Pain Orientation: Upper Pain Descriptors: Aching Pain Frequency: Intermittent Pain Onset: Gradual Patients Stated Pain Goal: 2 Pain Intervention(s): Medication (See eMAR)  See FIM for current functional status  Therapy/Group: Individual Therapy  [name] [facility] 08/30/2012, 11:44 AM

## 2012-08-30 NOTE — Progress Notes (Signed)
Occupational Therapy Session Note  Patient Details  Name: Alex Foster MRN: [id number] Date of Birth: December 06, 1945  Today's Date: 08/30/2012 Time: 1000-1055 Time Calculation (min): 55 min  Short Term Goals: Week 3:  OT Short Term Goal 1 (Week 3): Pt would perform sit to stand with ADL tasks with supervision with min cuing for no WB through right UE OT Short Term Goal 2 (Week 3): Pt would self feed with left Ue hand wtih min A (tryingto limit use of right UE) OT Short Term Goal 3 (Week 3): Pt will demonstrate emergent awareness wtih min A during functional activity OT Short Term Goal 4 (Week 3): Pt will perform toileting wtih mod A  Skilled Therapeutic Interventions/Progress Updates:    1:1self care retraining at shower level. Focus on functional ambulation with bilateral platform RW to bathroom, sit to stands, transfers stand pivot, slowing down to pass where he can use left UE more affectively, frustration tolerance with min encouragement and praise, dressing with hemi dressing techniques with mod A. Min cuing to maintain weight bearing precautions for right UE.   Therapy Documentation Precautions:  Precautions Precautions: Fall Precaution Comments: L shoulder subluxation and L side hemiparesis. Peg tube, trach , right wrist fracture: in a short arm cast Restrictions Weight Bearing Restrictions: No Pain: Pain Assessment Pain Assessment: 0-10 Pain Score:   4 Pain Type: Acute pain Pain Location: Back Pain Orientation: Upper Pain Descriptors: Aching Pain Frequency: Intermittent Pain Onset: Gradual Patients Stated Pain Goal: 2 Pain Intervention(s): Medication (See eMAR)  See FIM for current functional status  Therapy/Group: Individual Therapy  [name] [facility] 08/30/2012, 10:55 AM

## 2012-08-30 NOTE — Progress Notes (Signed)
Physical Therapy Session Note  Patient Details  Name: Alex Foster MRN: [id number] Date of Birth: [birth year]  Today's Date: 08/30/2012 Time: 1100-1200 Time Calculation (min): 60 min  Short Term Goals: Week 3:  PT Short Term Goal 1 (Week 3): Pt will propel w/c with supervision 25' in controlled environment PT Short Term Goal 2 (Week 3): Pt will gait with mod A 25' in controlled environment  Skilled Therapeutic Interventions/Progress Updates:    Patient received sitting in wheelchair. Patient sit>stand using L UE only to push up from chair to platform walker, requires min assist. Patient instructed in gait training 100' x2 with platform walker and min assist with 3-4 instances of mod assist secondary to poor L foot clearance, narrow BOS causing feet to touch, and ambulation outside of platform walker. Patient requires verbal cues to maintain ambulation within platform walker, not to let walker get too far ahead, pacing to slow down, and to increase BOS. Manual facilitation for weight shifts provided. Part of gait training performed on carpet; patient demonstrates increased difficulty negotiating platform walker and clearing L foot on carpet.  Patient instructed in wheelchair mobility 100' x1 and 41' x1 with min assist. Patient utilizes B LEs to propel and requires min assist for initiation of propulsion and negotiation of obstacles. Patient performed 2 sets x10 repetitions sit<>stand from wheelchair without use of AD; patient utilizes L UE to push up from wheelchair. Emphasis on proper form and controlled descent. Patient performed seated strengthening/coordination activity with B LEs. Emphasis on eccentric contraction of quadriceps and alternating movements.  Patient handed off to RN at end of session.  Therapy Documentation Precautions:  Precautions Precautions: Fall Precaution Comments: L shoulder subluxation and L side hemiparesis. Peg tube , right wrist fracture: in a short arm cast    Required Braces or Orthoses: Other Brace/Splint Other Brace/Splint: short arm cast R UE Restrictions Weight Bearing Restrictions: Yes RUE Weight Bearing: Non weight bearing Pain: Pain Assessment Pain Assessment: No/denies pain Pain Score:   4 Locomotion : Ambulation Ambulation/Gait Assistance: 3: Mod assist Wheelchair Mobility Distance: 100   See FIM for current functional status  Therapy/Group: Individual Therapy  Chipper Herb. Jentzen Minasyan, PT, DPT  08/30/2012, 12:39 PM

## 2012-08-30 NOTE — Progress Notes (Signed)
Speech Language Pathology Daily Session Note  Patient Details  Name: Alex Foster MRN: [id number] Date of Birth: [birth year]  Today's Date: 08/30/2012 Time: 0800-0855 Time Calculation (min): 55 min  Short Term Goals: Week 3: SLP Short Term Goal 1 (Week 3): Pt will demonstrate problem solving for mildly complex tasks with Mod A verbal cues.  SLP Short Term Goal 2 (Week 3): Pt will demonstrate alternating attention between two functional tasks with Mod A verbal cues.  SLP Short Term Goal 3 (Week 3): Pt will demonstrate recall new, daily information with supervision verbal cues.  SLP Short Term Goal 4 (Week 3): Pt will consume Dys. 2 textures and nectar-thick liquids with supervision verbal cues for utilization of compensatory strategies.   Skilled Therapeutic Interventions: Treatment focus on cognitive goals. Pt participated in a new, mildly complex task with focus on following complex commands and working memory. Pt required Mod verbal cues to follow multi-step mildly complex commands and utilized memory compensatory strategies to increase recall with Min semantic cues. Pt also required extra time and Min question cues to generate items within a specific category.    FIM:  Comprehension Comprehension Mode: Auditory Comprehension: 5-Follows basic conversation/direction: With extra time/assistive device Expression Expression Mode: Verbal Expression: 5-Expresses complex 90% of the time/cues < 10% of the time Social Interaction Social Interaction: 5-Interacts appropriately 90% of the time - Needs monitoring or encouragement for participation or interaction. Problem Solving Problem Solving: 5-Solves basic 90% of the time/requires cueing < 10% of the time Memory Memory: 4-Recognizes or recalls 75 - 89% of the time/requires cueing 10 - 24% of the time  Pain Pain Assessment Pain Assessment: No/denies pain  Therapy/Group: Individual Therapy  Lakena Sparlin 08/30/2012, 3:50 PM

## 2012-08-30 NOTE — Progress Notes (Signed)
Occupational Therapy Session Note  Patient Details  Name: Alex Foster MRN: [id number] Date of Birth: September 03, 1946  Today's Date: 08/30/2012 Time: 1330-1400 Time Calculation (min): 30 min  Short Term Goals: Week 4:  OT Short Term Goal 1 (Week 4): Pt will don shirt wtih Mod A OT Short Term Goal 2 (Week 4): Pt will perform LB dressing wtih mod A OT Short Term Goal 3 (Week 4): Pt will perform grooming when not in pain with min A OT Short Term Goal 4 (Week 4): Pt will demonstrate overall memory with min A  Skilled Therapeutic Interventions/Progress Updates:    1:1 focus on short distance ambulation mat to w/c with HHA, left UE exercises with 1/2 lb in hand in front of mirror for visual feedback of upright posture at midline (focusing on shoulder elevation, extension, flexion, elbow flexion and extension, grasp and release, supination and pronation) for normal patterns of movement to increase independence  of functional task.   Therapy Documentation Precautions:  Precautions Precautions: Fall Precaution Comments: L shoulder subluxation and L side hemiparesis. Peg tube , right wrist fracture: in a short arm cast  Required Braces or Orthoses: Other Brace/Splint Other Brace/Splint: short arm cast R UE Restrictions Weight Bearing Restrictions: Yes RUE Weight Bearing: Non weight bearing Pain: Pain Assessment Pain Assessment: No/denies pain  See FIM for current functional status  Therapy/Group: Individual Therapy  [name] [facility] 08/30/2012, 2:33 PM

## 2012-08-30 NOTE — Progress Notes (Signed)
Physical Therapy Session Note  Patient Details  Name: Alex Foster MRN: [id number] Date of Birth: Feb 26, 1946  Today's Date: 08/30/2012 Time: 1430-1530 Time Calculation (min): 60 min  Short Term Goals: Week 3:  PT Short Term Goal 1 (Week 3): Pt will propel w/c with supervision 25' in controlled environment PT Short Term Goal 2 (Week 3): Pt will gait with mod A 25' in controlled environment  Skilled Therapeutic Interventions/Progress Updates:    Patient instructed in wheelchair mobility 100' x2 with utilization of B LEs to propel and min assist required for initiation and verbal cues for obstacle negotiation. Patient instructed in gait training x 100' with platform walker and min assist. Patient requires 2 instances of mod assist secondary to poor L foot clearance resulting in anterior LOB and gait training outside of platform walker.  Patient performs standing balance activities and high level ambulation at parallel bars: feet together 2x30", side stepping 6 sets x15', backwards walking 2 sets x15', heel raises x20, toe raises x20; all activities requiring min assist.  Patient requiring multiple rest breaks of increased duration secondary to increased c/o fatigue. Patient states he doesn't want to do anything else standing. Patient instructed in seated NMR exercises with emphasis on eccentric contractions, coordination, alternating rapid movements, and muscle control.  Patient returned to room and left with all needs within reach.  Therapy Documentation Precautions:  Precautions Precautions: Fall Precaution Comments: L shoulder subluxation and L side hemiparesis. Peg tube , right wrist fracture: in a short arm cast  Required Braces or Orthoses: Other Brace/Splint (R arm cast) Other Brace/Splint: short arm cast R UE Restrictions Weight Bearing Restrictions: Yes RUE Weight Bearing: Non weight bearing  See FIM for current functional status  Therapy/Group: Individual  Therapy  Chipper Herb. Galaxy Borden, PT, DPT  08/30/2012, 4:35 PM

## 2012-08-31 LAB — GLUCOSE, CAPILLARY

## 2012-08-31 LAB — URINE CULTURE

## 2012-08-31 LAB — PROTIME-INR
INR: 2.11 — ABNORMAL HIGH (ref 0.00–1.49)
Prothrombin Time: 22.8 seconds — ABNORMAL HIGH (ref 11.6–15.2)

## 2012-08-31 MED ORDER — WARFARIN SODIUM 6 MG PO TABS
6.0000 mg | ORAL_TABLET | Freq: Once | ORAL | Status: AC
  Administered 2012-08-31: 6 mg via ORAL
  Filled 2012-08-31: qty 1

## 2012-08-31 NOTE — Progress Notes (Signed)
Subjective/Complaints:  No new complains. There is  some constipation and occasional urine retention.    Review of Systems  Unable to perform ROS: mental acuity   Objective: Vital Signs: Blood pressure 168/82, pulse 62, temperature 98.2 F (36.8 C), temperature source Oral, resp. rate 19, weight 209 lb 7 oz (95 kg), SpO2 95.00%. No results found. Results for orders placed during the hospital encounter of 08/08/12 (from the past 72 hour(s))  GLUCOSE, CAPILLARY     Status: Abnormal   Collection Time   08/28/12 11:45 AM      Component Value Range Comment   Glucose-Capillary 141 (*) 70 - 99 mg/dL    Comment 1 Notify RN     GLUCOSE, CAPILLARY     Status: Abnormal   Collection Time   08/28/12  4:14 PM      Component Value Range Comment   Glucose-Capillary 136 (*) 70 - 99 mg/dL   GLUCOSE, CAPILLARY     Status: Abnormal   Collection Time   08/28/12  8:10 PM      Component Value Range Comment   Glucose-Capillary 181 (*) 70 - 99 mg/dL   PROTIME-INR     Status: Abnormal   Collection Time   08/29/12  6:45 AM      Component Value Range Comment   Prothrombin Time 25.5 (*) 11.6 - 15.2 seconds    INR 2.46 (*) 0.00 - 1.49   GLUCOSE, CAPILLARY     Status: Abnormal   Collection Time   08/29/12  7:22 AM      Component Value Range Comment   Glucose-Capillary 111 (*) 70 - 99 mg/dL    Comment 1 Notify RN     GLUCOSE, CAPILLARY     Status: Abnormal   Collection Time   08/29/12 11:22 AM      Component Value Range Comment   Glucose-Capillary 178 (*) 70 - 99 mg/dL    Comment 1 Notify RN     GLUCOSE, CAPILLARY     Status: Abnormal   Collection Time   08/29/12  3:57 PM      Component Value Range Comment   Glucose-Capillary 132 (*) 70 - 99 mg/dL   GLUCOSE, CAPILLARY     Status: Abnormal   Collection Time   08/29/12  8:57 PM      Component Value Range Comment   Glucose-Capillary 162 (*) 70 - 99 mg/dL    Comment 1 Notify RN     PROTIME-INR     Status: Abnormal   Collection Time   08/30/12  6:30 AM      Component Value Range Comment   Prothrombin Time 23.6 (*) 11.6 - 15.2 seconds    INR 2.21 (*) 0.00 - 1.49   GLUCOSE, CAPILLARY     Status: Abnormal   Collection Time   08/30/12  7:18 AM      Component Value Range Comment   Glucose-Capillary 147 (*) 70 - 99 mg/dL    Comment 1 Notify RN     GLUCOSE, CAPILLARY     Status: Abnormal   Collection Time   08/30/12 11:25 AM      Component Value Range Comment   Glucose-Capillary 157 (*) 70 - 99 mg/dL    Comment 1 Notify RN     GLUCOSE, CAPILLARY     Status: Abnormal   Collection Time   08/30/12  4:22 PM      Component Value Range Comment   Glucose-Capillary 186 (*) 70 - 99 mg/dL  Comment 1 Notify RN     URINALYSIS, ROUTINE W REFLEX MICROSCOPIC     Status: Normal   Collection Time   08/30/12  6:01 PM      Component Value Range Comment   Color, Urine YELLOW  YELLOW    APPearance CLEAR  CLEAR    Specific Gravity, Urine 1.025  1.005 - 1.030    pH 6.0  5.0 - 8.0    Glucose, UA NEGATIVE  NEGATIVE mg/dL    Hgb urine dipstick NEGATIVE  NEGATIVE    Bilirubin Urine NEGATIVE  NEGATIVE    Ketones, ur NEGATIVE  NEGATIVE mg/dL    Protein, ur NEGATIVE  NEGATIVE mg/dL    Urobilinogen, UA 1.0  0.0 - 1.0 mg/dL    Nitrite NEGATIVE  NEGATIVE    Leukocytes, UA NEGATIVE  NEGATIVE MICROSCOPIC NOT DONE ON URINES WITH NEGATIVE PROTEIN, BLOOD, LEUKOCYTES, NITRITE, OR GLUCOSE <1000 mg/dL.  GLUCOSE, CAPILLARY     Status: Abnormal   Collection Time   08/30/12  9:20 PM      Component Value Range Comment   Glucose-Capillary 197 (*) 70 - 99 mg/dL    Comment 1 Notify RN     PROTIME-INR     Status: Abnormal   Collection Time   08/31/12  6:20 AM      Component Value Range Comment   Prothrombin Time 22.8 (*) 11.6 - 15.2 seconds    INR 2.11 (*) 0.00 - 1.49   GLUCOSE, CAPILLARY     Status: Abnormal   Collection Time   08/31/12  8:01 AM      Component Value Range Comment   Glucose-Capillary 125 (*) 70 - 99 mg/dL    Comment 1 Notify RN        HENT:  Patient with multiple healing abrasions.  Neck:  Tracheostomy tube in place,  No drainage. Able to speak with PMV with good vocal quality Cardiovascular: Normal rate and regular rhythm.  Pulmonary/Chest:  Decreased breath sounds at the bases coarse upper airway sounds still. No distress. Abdominal: Soft. He exhibits no distension.  Gastrostomy tube in place with some scant drainage around tube without odor.  Neurological: Reflex Scores:  Tricep reflexes are 2+ on the right side and 3+ on the left side.  Bicep reflexes are 2+ on the right side and 3+ on the left side.  Brachioradialis reflexes are 2+ on the right side and 3+ on the left side.  Patellar reflexes are 2+ on the right side and 3+ on the left side.  Achilles reflexes are 2+ on the right side and 3+ on the left side.  very alert, answers simple questions appropriately. Calls to use the bathroom. L field cut seems to be biggest visual issue at present. Exam more consistent. 4 /5 strength in the left deltoid bicep tricep and grip 4/5 in the right deltoid bicep tricep and grip 3 + to 4 in the left hip extensor knee extensor synergistic pattern and 4/5 in the right lower extremity     Assessment/Plan: 1. Functional deficits secondary to Severe TBI multitrauma which require 3+ hours per day of interdisciplinary therapy in a comprehensive inpatient rehab setting. Physiatrist is providing close team supervision and 24 hour management of active medical problems listed below. Physiatrist and rehab team continue to assess barriers to discharge/monitor patient progress toward functional and medical goals.  Placement pending  FIM: FIM - Bathing Bathing Steps Patient Completed: Chest;Abdomen;Front perineal area;Right upper leg;Left upper leg Bathing: 3: Mod-Patient completes 5-7 45f  10 parts or 50-74%  FIM - Upper Body Dressing/Undressing Upper body dressing/undressing steps patient completed: Thread/unthread right sleeve  of pullover shirt/dresss;Thread/unthread left sleeve of pullover shirt/dress Upper body dressing/undressing: 3: Mod-Patient completed 50-74% of tasks FIM - Lower Body Dressing/Undressing Lower body dressing/undressing steps patient completed: Thread/unthread right underwear leg;Thread/unthread left underwear leg;Thread/unthread right pants leg;Thread/unthread left pants leg;Fasten/unfasten right shoe;Fasten/unfasten left shoe Lower body dressing/undressing: 3: Mod-Patient completed 50-74% of tasks  FIM - Hotel manager Devices: Grab bar or rail for support Toileting: 7: Independent: No helper, no device  FIM - Diplomatic Services operational officer Devices: Grab bars Toilet Transfers: 3-To toilet/BSC: Mod A (lift or lower assist);3-From toilet/BSC: Mod A (lift or lower assist)  FIM - Bed/Chair Transfer Bed/Chair Transfer Assistive Devices: Arm rests Bed/Chair Transfer: 4: Bed > Chair or W/C: Min A (steadying Pt. > 75%);4: Chair or W/C > Bed: Min A (steadying Pt. > 75%)  FIM - Locomotion: Wheelchair Distance: 100 Locomotion: Wheelchair: 2: Travels 50 - 149 ft with minimal assistance (Pt.>75%) FIM - Locomotion: Ambulation Locomotion: Ambulation Assistive Devices: TEFL teacher Ambulation/Gait Assistance: 3: Mod assist Locomotion: Ambulation: 2: Travels 50 - 149 ft with moderate assistance (Pt: 50 - 74%)  Medical Problem List and Plan:  1. 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 DVT Prophylaxis/Anticoagulation: Right upper extremity DVT. Continue Coumadin therapy. Monitor platelet counts any signs of bleeding  3. pain management. Oxycodone 10 mg every 6 hours as needed  4. Mood: Amantadine 100 mg 2 times a day, Klonopin0.5 mg twice a day, wean off, Inderal 30 mg every 12 hours, Ativan 1 mg every 4 as needed anxiety.t  Continue to monitor and modify behavior program,   -celexa qhs Agitation at noc increased Seroquel to 50mg  5.  Neuropsych: This patient is not capable of making decisions on his/her own behalf.  6. Multiple facial fractures. Conservative care per ENT Dr. Emeline Darling  7. L1 transverse process fracture. Conservative care,   scheduled oxycodone  8. VDRF/tracheostomy.     -trach out- site closing 9. Gastrostomy tube 07/03/2012.  .   On  D2/nectar diet --eating well  -bmet ok. Eating quite well. Drinking adequate liquids  -probably will leave in G-tube until he's on regular liquids 10. Bilateral pneumothorax. Resolved and chest tubes removed  11. MRSA tracheobronchitis. Vancomycin completed after 14 days. Afebrile, no cough  -pulmonary mentions abx course. i can't see where that it's indicated at this time  -continue close observation for fever, sx 12. History diabetes mellitus. Continue Lantus insulin and Glucophage as directed. Check blood sugars routine   -observe as we transition to diet 13. Urinary retention.Flomax 0.8 mg daily. Patient currently has a condom catheter. Normal PVRs x3.   -odor-check ua and culture 14.  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    R ulnar fx -cast - further separation on follow up xray    -ortho reviewed films-ordered replacement of SAC  -pt has better awareness now to protect the hand  LOS (Days) 23 A FACE TO FACE EVALUATION WAS PERFORMED  Alex Foster 08/31/2012, 8:31 AM

## 2012-08-31 NOTE — Progress Notes (Signed)
ANTICOAGULATION CONSULT NOTE - Follow Up Consult  Pharmacy Consult:  Coumadin Indication:  RUE DVT  Allergies  Allergen Reactions  . Penicillins    Patient Measurements: Weight: 209 lb 7 oz (95 kg)  Vital Signs: Temp: 98.2 F (36.8 C) (11/30 0450) Temp src: Oral (11/30 0450) BP: 168/82 mmHg (11/30 0450) Pulse Rate: 62  (11/30 0450)  Labs:  Basename 08/31/12 0620 08/30/12 0630 08/29/12 0645  HGB -- -- --  HCT -- -- --  PLT -- -- --  APTT -- -- --  LABPROT 22.8* 23.6* 25.5*  INR 2.11* 2.21* 2.46*  HEPARINUNFRC -- -- --  CREATININE -- -- --  CKTOTAL -- -- --  CKMB -- -- --  TROPONINI -- -- --   Assessment: Alex Foster admitted to rehab s/p TBI / multi-trauma with hemorrhagic contusion to the head.  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  He developed and upper extremity DVT and is now continued on Coumadin while he is being cared for in the rehab unit.  INR remains therapeutic at 2.11. No CBC today, No bleeding noted.  Goal of Therapy:  INR 2 - 3 (aim for 2 - 2.5 d/t recent TBI with [employer])   Plan:  1. Repeat Coumadin 6 mg PO x 1 tonight 2. F/u AM INR  Lysle Pearl, PharmD, BCPS Pager # [phone] 08/31/2012 7:21 AM

## 2012-09-01 LAB — PROTIME-INR
INR: 1.95 — ABNORMAL HIGH (ref 0.00–1.49)
Prothrombin Time: 21.5 seconds — ABNORMAL HIGH (ref 11.6–15.2)

## 2012-09-01 LAB — GLUCOSE, CAPILLARY
Glucose-Capillary: 103 mg/dL — ABNORMAL HIGH (ref 70–99)
Glucose-Capillary: 156 mg/dL — ABNORMAL HIGH (ref 70–99)

## 2012-09-01 MED ORDER — WARFARIN SODIUM 7.5 MG PO TABS
7.5000 mg | ORAL_TABLET | Freq: Once | ORAL | Status: AC
  Administered 2012-09-01: 7.5 mg via ORAL
  Filled 2012-09-01: qty 1

## 2012-09-01 NOTE — Progress Notes (Signed)
Subjective/Complaints:  No new complains. There is  some constipation and occasional urine retention. He slept well.    Review of Systems  Unable to perform ROS: mental acuity   Objective: Vital Signs: Blood pressure 147/74, pulse 64, temperature 97.7 F (36.5 C), temperature source Oral, resp. rate 18, weight 209 lb 7 oz (95 kg), SpO2 95.00%. No results found. Results for orders placed during the hospital encounter of 08/08/12 (from the past 72 hour(s))  GLUCOSE, CAPILLARY     Status: Abnormal   Collection Time   08/29/12 11:22 AM      Component Value Range Comment   Glucose-Capillary 178 (*) 70 - 99 mg/dL    Comment 1 Notify RN     GLUCOSE, CAPILLARY     Status: Abnormal   Collection Time   08/29/12  3:57 PM      Component Value Range Comment   Glucose-Capillary 132 (*) 70 - 99 mg/dL   GLUCOSE, CAPILLARY     Status: Abnormal   Collection Time   08/29/12  8:57 PM      Component Value Range Comment   Glucose-Capillary 162 (*) 70 - 99 mg/dL    Comment 1 Notify RN     PROTIME-INR     Status: Abnormal   Collection Time   08/30/12  6:30 AM      Component Value Range Comment   Prothrombin Time 23.6 (*) 11.6 - 15.2 seconds    INR 2.21 (*) 0.00 - 1.49   GLUCOSE, CAPILLARY     Status: Abnormal   Collection Time   08/30/12  7:18 AM      Component Value Range Comment   Glucose-Capillary 147 (*) 70 - 99 mg/dL    Comment 1 Notify RN     GLUCOSE, CAPILLARY     Status: Abnormal   Collection Time   08/30/12 11:25 AM      Component Value Range Comment   Glucose-Capillary 157 (*) 70 - 99 mg/dL    Comment 1 Notify RN     GLUCOSE, CAPILLARY     Status: Abnormal   Collection Time   08/30/12  4:22 PM      Component Value Range Comment   Glucose-Capillary 186 (*) 70 - 99 mg/dL    Comment 1 Notify RN     URINE CULTURE     Status: Normal   Collection Time   08/30/12  6:01 PM      Component Value Range Comment   Specimen Description URINE, CLEAN CATCH      Special Requests NONE        Culture  Setup Time 08/21/2012 18:59      Colony Count 3,000 COLONIES/ML      Culture INSIGNIFICANT GROWTH      Report Status 08/31/2012 FINAL     URINALYSIS, ROUTINE W REFLEX MICROSCOPIC     Status: Normal   Collection Time   08/30/12  6:01 PM      Component Value Range Comment   Color, Urine YELLOW  YELLOW    APPearance CLEAR  CLEAR    Specific Gravity, Urine 1.025  1.005 - 1.030    pH 6.0  5.0 - 8.0    Glucose, UA NEGATIVE  NEGATIVE mg/dL    Hgb urine dipstick NEGATIVE  NEGATIVE    Bilirubin Urine NEGATIVE  NEGATIVE    Ketones, ur NEGATIVE  NEGATIVE mg/dL    Protein, ur NEGATIVE  NEGATIVE mg/dL    Urobilinogen, UA 1.0  0.0 - 1.0  mg/dL    Nitrite NEGATIVE  NEGATIVE    Leukocytes, UA NEGATIVE  NEGATIVE MICROSCOPIC NOT DONE ON URINES WITH NEGATIVE PROTEIN, BLOOD, LEUKOCYTES, NITRITE, OR GLUCOSE <1000 mg/dL.  GLUCOSE, CAPILLARY     Status: Abnormal   Collection Time   08/30/12  9:20 PM      Component Value Range Comment   Glucose-Capillary 197 (*) 70 - 99 mg/dL    Comment 1 Notify RN     PROTIME-INR     Status: Abnormal   Collection Time   08/31/12  6:20 AM      Component Value Range Comment   Prothrombin Time 22.8 (*) 11.6 - 15.2 seconds    INR 2.11 (*) 0.00 - 1.49   GLUCOSE, CAPILLARY     Status: Abnormal   Collection Time   08/31/12  8:01 AM      Component Value Range Comment   Glucose-Capillary 125 (*) 70 - 99 mg/dL    Comment 1 Notify RN     GLUCOSE, CAPILLARY     Status: Abnormal   Collection Time   08/31/12 11:45 AM      Component Value Range Comment   Glucose-Capillary 124 (*) 70 - 99 mg/dL   GLUCOSE, CAPILLARY     Status: Abnormal   Collection Time   08/31/12  4:32 PM      Component Value Range Comment   Glucose-Capillary 116 (*) 70 - 99 mg/dL   GLUCOSE, CAPILLARY     Status: Abnormal   Collection Time   08/31/12  8:39 PM      Component Value Range Comment   Glucose-Capillary 132 (*) 70 - 99 mg/dL    Comment 1 Notify RN       BP Readings from Last 3  Encounters:  09/01/12 147/74  08/08/12 142/75  08/08/12 142/75    HENT:  Patient with multiple healing abrasions.  Neck:  Tracheostomy tube in place,  No drainage. Able to speak with PMV with good vocal quality Cardiovascular: Normal rate and regular rhythm.  Pulmonary/Chest:  Decreased breath sounds at the bases coarse upper airway sounds still. No distress. Abdominal: Soft. He exhibits no distension.  Gastrostomy tube in place with some scant drainage around tube without odor.  Neurological: Reflex Scores:  Tricep reflexes are 2+ on the right side and 3+ on the left side.  Bicep reflexes are 2+ on the right side and 3+ on the left side.  Brachioradialis reflexes are 2+ on the right side and 3+ on the left side.  Patellar reflexes are 2+ on the right side and 3+ on the left side.  Achilles reflexes are 2+ on the right side and 3+ on the left side.  very alert, answers simple questions appropriately. Calls to use the bathroom. L field cut seems to be biggest visual issue at present. Exam more consistent. 4 /5 strength in the left deltoid bicep tricep and grip 4/5 in the right deltoid bicep tricep and grip 3 + to 4 in the left hip extensor knee extensor synergistic pattern and 4/5 in the right lower extremity     Assessment/Plan: 1. Functional deficits secondary to Severe TBI multitrauma which require 3+ hours per day of interdisciplinary therapy in a comprehensive inpatient rehab setting. Physiatrist is providing close team supervision and 24 hour management of active medical problems listed below. Physiatrist and rehab team continue to assess barriers to discharge/monitor patient progress toward functional and medical goals.  Placement pending  FIM: FIM - Bathing Bathing Steps Patient  Completed: Chest;Abdomen;Front perineal area;Right upper leg;Left upper leg Bathing: 3: Mod-Patient completes 5-7 73f 10 parts or 50-74%  FIM - Upper Body Dressing/Undressing Upper body  dressing/undressing steps patient completed: Thread/unthread right sleeve of pullover shirt/dresss;Thread/unthread left sleeve of pullover shirt/dress Upper body dressing/undressing: 3: Mod-Patient completed 50-74% of tasks FIM - Lower Body Dressing/Undressing Lower body dressing/undressing steps patient completed: Thread/unthread right underwear leg;Thread/unthread left underwear leg;Thread/unthread right pants leg;Thread/unthread left pants leg;Fasten/unfasten right shoe;Fasten/unfasten left shoe Lower body dressing/undressing: 3: Mod-Patient completed 50-74% of tasks  FIM - Toileting Toileting steps completed by patient: Adjust clothing after toileting Toileting Assistive Devices: Grab bar or rail for support Toileting: 2: Max-Patient completed 1 of 3 steps  FIM - Diplomatic Services operational officer Devices: Grab bars Toilet Transfers: 4-To toilet/BSC: Min A (steadying Pt. > 75%);4-From toilet/BSC: Min A (steadying Pt. > 75%)  FIM - Bed/Chair Transfer Bed/Chair Transfer Assistive Devices: Arm rests Bed/Chair Transfer: 4: Bed > Chair or W/C: Min A (steadying Pt. > 75%);4: Supine > Sit: Min A (steadying Pt. > 75%/lift 1 leg)  FIM - Locomotion: Wheelchair Distance: 100 Locomotion: Wheelchair: 2: Travels 50 - 149 ft with minimal assistance (Pt.>75%) FIM - Locomotion: Ambulation Locomotion: Ambulation Assistive Devices: TEFL teacher Ambulation/Gait Assistance: 3: Mod assist Locomotion: Ambulation: 2: Travels 50 - 149 ft with moderate assistance (Pt: 50 - 74%)  Medical Problem List and Plan:  1. 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 DVT Prophylaxis/Anticoagulation: Right upper extremity DVT. Continue Coumadin therapy. Monitor platelet counts any signs of bleeding  3. pain management. Oxycodone 10 mg every 6 hours as needed  4. Mood: Amantadine 100 mg 2 times a day, Klonopin0.5 mg twice a day, wean off, Inderal 30 mg every 12 hours,  Ativan 1 mg every 4 as needed anxiety.t  Continue to monitor and modify behavior program,   -celexa qhs Agitation at noc increased Seroquel to 50mg  5. Neuropsych: This patient is not capable of making decisions on his/her own behalf.  6. Multiple facial fractures. Conservative care per ENT Dr. Emeline Darling  7. L1 transverse process fracture. Conservative care,   scheduled oxycodone  8. VDRF/tracheostomy.     -trach out- site closing 9. Gastrostomy tube 07/03/2012.  .   On  D2/nectar diet --eating well  -bmet ok. Eating quite well. Drinking adequate liquids  -probably will leave in G-tube until he's on regular liquids 10. Bilateral pneumothorax. Resolved and chest tubes removed  11. MRSA tracheobronchitis. Vancomycin completed after 14 days. Afebrile, no cough  -pulmonary mentions abx course. i can't see where that it's indicated at this time  -continue close observation for fever, sx 12. History diabetes mellitus. Continue Lantus insulin and Glucophage as directed. Check blood sugars routine   -observe as we transition to diet 13. Urinary retention.Flomax 0.8 mg daily. Patient currently has a condom catheter. Normal PVRs x3.   -odor-check ua and culture 14.  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    R ulnar fx -cast - further separation on follow up xray    -ortho reviewed films-ordered replacement of SAC  -pt has better awareness now to protect the hand 16. HTN. Cont to monitor BP LOS (Days) 24 A FACE TO FACE EVALUATION WAS PERFORMED  Alex Torryn Fiske 09/01/2012, 7:45 AM

## 2012-09-01 NOTE — Progress Notes (Signed)
ANTICOAGULATION CONSULT NOTE - Follow Up Consult  Pharmacy Consult:  Coumadin Indication:  RUE DVT  Allergies  Allergen Reactions  . Penicillins    Patient Measurements: Weight: 209 lb 7 oz (95 kg)  Vital Signs: Temp: 97.7 F (36.5 C) (12/01 0545) Temp src: Oral (12/01 0545) BP: 147/74 mmHg (12/01 0545) Pulse Rate: 64  (12/01 0545)  Labs:  Basename 09/01/12 0745 08/31/12 0620 08/30/12 0630  HGB -- -- --  HCT -- -- --  PLT -- -- --  APTT -- -- --  LABPROT 21.5* 22.8* 23.6*  INR 1.95* 2.11* 2.21*  HEPARINUNFRC -- -- --  CREATININE -- -- --  CKTOTAL -- -- --  CKMB -- -- --  TROPONINI -- -- --   Assessment: 31 YOM admitted to rehab s/p TBI / multi-trauma with hemorrhagic contusion to the head.  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  He developed and upper extremity DVT and is now continued on Coumadin while he is being cared for in the rehab unit.  INR is slightly low at 1.95. No CBC today, No bleeding noted.  Goal of Therapy:  INR 2 - 3 (aim for 2 - 2.5 d/t recent TBI with [facility])   Plan:  1. Coumadin 7.5mg  PO x 1 tonight 2. F/u AM INR  Lysle Pearl, PharmD, BCPS Pager # [phone] 09/01/2012 9:24 AM

## 2012-09-02 ENCOUNTER — Encounter (HOSPITAL_COMMUNITY): Payer: Self-pay | Admitting: Occupational Therapy

## 2012-09-02 ENCOUNTER — Inpatient Hospital Stay (HOSPITAL_COMMUNITY): Payer: Self-pay | Admitting: Speech Pathology

## 2012-09-02 ENCOUNTER — Inpatient Hospital Stay (HOSPITAL_COMMUNITY): Payer: Medicaid Other | Admitting: Physical Therapy

## 2012-09-02 DIAGNOSIS — S069X9A Unspecified intracranial injury with loss of consciousness of unspecified duration, initial encounter: Secondary | ICD-10-CM

## 2012-09-02 LAB — GLUCOSE, CAPILLARY
Glucose-Capillary: 108 mg/dL — ABNORMAL HIGH (ref 70–99)
Glucose-Capillary: 148 mg/dL — ABNORMAL HIGH (ref 70–99)
Glucose-Capillary: 97 mg/dL (ref 70–99)

## 2012-09-02 LAB — PROTIME-INR: INR: 1.93 — ABNORMAL HIGH (ref 0.00–1.49)

## 2012-09-02 MED ORDER — CLONAZEPAM 0.5 MG PO TABS
0.2500 mg | ORAL_TABLET | Freq: Every day | ORAL | Status: DC
  Administered 2012-09-02 – 2012-09-03 (×2): 0.25 mg via ORAL
  Filled 2012-09-02 (×3): qty 1

## 2012-09-02 MED ORDER — WARFARIN SODIUM 7.5 MG PO TABS
7.5000 mg | ORAL_TABLET | Freq: Once | ORAL | Status: AC
  Administered 2012-09-02: 7.5 mg via ORAL
  Filled 2012-09-02: qty 1

## 2012-09-02 MED ORDER — PANTOPRAZOLE SODIUM 40 MG PO TBEC
40.0000 mg | DELAYED_RELEASE_TABLET | Freq: Every day | ORAL | Status: DC
  Administered 2012-09-03 – 2012-09-11 (×9): 40 mg via ORAL
  Filled 2012-09-02 (×7): qty 1

## 2012-09-02 MED ORDER — CITALOPRAM HYDROBROMIDE 10 MG PO TABS
10.0000 mg | ORAL_TABLET | Freq: Every day | ORAL | Status: DC
  Administered 2012-09-03 – 2012-09-11 (×9): 10 mg via ORAL
  Filled 2012-09-02 (×13): qty 1

## 2012-09-02 MED ORDER — QUETIAPINE FUMARATE 25 MG PO TABS
25.0000 mg | ORAL_TABLET | Freq: Every day | ORAL | Status: DC
  Administered 2012-09-02 – 2012-09-10 (×9): 25 mg via ORAL
  Filled 2012-09-02 (×10): qty 1

## 2012-09-02 MED ORDER — PROPRANOLOL HCL 20 MG PO TABS
30.0000 mg | ORAL_TABLET | Freq: Two times a day (BID) | ORAL | Status: DC
  Administered 2012-09-02 – 2012-09-11 (×18): 30 mg via ORAL
  Filled 2012-09-02 (×21): qty 1

## 2012-09-02 MED ORDER — OXYCODONE HCL 5 MG PO TABS
10.0000 mg | ORAL_TABLET | ORAL | Status: DC | PRN
  Administered 2012-09-02 – 2012-09-04 (×8): 10 mg via ORAL
  Administered 2012-09-05: 5 mg via ORAL
  Administered 2012-09-05 – 2012-09-10 (×16): 10 mg via ORAL
  Filled 2012-09-02 (×5): qty 2
  Filled 2012-09-02: qty 1
  Filled 2012-09-02 (×19): qty 2

## 2012-09-02 MED ORDER — FREE WATER
100.0000 mL | Freq: Every day | Status: DC
  Administered 2012-09-02 – 2012-09-04 (×3): 100 mL

## 2012-09-02 MED ORDER — AMANTADINE HCL 100 MG PO CAPS
100.0000 mg | ORAL_CAPSULE | Freq: Two times a day (BID) | ORAL | Status: DC
  Administered 2012-09-02 – 2012-09-03 (×3): 100 mg via ORAL
  Filled 2012-09-02 (×6): qty 1

## 2012-09-02 NOTE — Progress Notes (Signed)
Nutrition Follow-up  Intervention:   Continue Ensure pudding bid   Continue double portion entrees.  Assessment:   Patient has been tolerating diet very well; eating 75-100% of meals.  Protein and calorie needs remain very high.  G tube to remain in place until patient is tolerating thin liquids.  Diet Order:  Dysphagia 2 Nectar thick   Free Water:  100 ml daily per G tube  Meds: Scheduled Meds:    . amantadine  100 mg Per Tube BID  . antiseptic oral rinse  15 mL Mouth Rinse QID  . citalopram  10 mg Per Tube Daily  . clonazePAM  0.25 mg Oral QHS  . free water  100 mL Per Tube Daily  . glycopyrrolate  1 mg Per Tube BID  . guaifenesin  300 mg Per Tube Q6H  . insulin aspart  0-5 Units Subcutaneous QHS  . insulin aspart  0-9 Units Subcutaneous TID WC  . metFORMIN  500 mg Oral BID WC  . neomycin-polymyxin-pramoxine   Topical BID  . pantoprazole sodium  40 mg Per Tube Q1200  . propranolol  30 mg Per Tube Q12H  . QUEtiapine  25 mg Oral QHS  . Tamsulosin HCl  0.8 mg Oral QHS  . [COMPLETED] warfarin  7.5 mg Oral ONCE-1800  . warfarin  7.5 mg Oral ONCE-1800  . Warfarin - Pharmacist Dosing Inpatient   Does not apply q1800  . [DISCONTINUED] clonazePAM  0.5 mg Oral QHS  . [DISCONTINUED] free water  100 mL Per Tube BID  . [DISCONTINUED] QUEtiapine  50 mg Oral QHS   Continuous Infusions:  PRN Meds:.acetaminophen (TYLENOL) oral liquid 160 mg/5 mL, acetaminophen, albuterol, bisacodyl, guaifenesin, ipratropium, LORazepam, methocarbamol, ondansetron (ZOFRAN) IV, ondansetron, oxyCODONE, senna-docusate, sorbitol, traZODone   CMP     Component Value Date/Time   NA 137 08/26/2012 0759   K 4.4 08/26/2012 0759   CL 97 08/26/2012 0759   CO2 30 08/26/2012 0759   GLUCOSE 127* 08/26/2012 0759   BUN 19 08/26/2012 0759   CREATININE 1.01 08/26/2012 0759   CREATININE 1.15 03/13/2012 1043   CALCIUM 9.4 08/26/2012 0759   PROT 6.8 08/09/2012 0505   ALBUMIN 2.7* 08/09/2012 0505   AST 18 08/09/2012 0505    ALT 10 08/09/2012 0505   ALKPHOS 151* 08/09/2012 0505   BILITOT 0.5 08/09/2012 0505   GFRNONAA 77* 08/26/2012 0759   GFRAA 89* 08/26/2012 0759    CBG (last 3)   Basename 09/02/12 0755 09/01/12 2057 09/01/12 1608  GLUCAP 108* 171* 116*     Intake/Output Summary (Last 24 hours) at 09/02/12 1059 Last data filed at 09/02/12 0900  Gross per 24 hour  Intake    960 ml  Output    950 ml  Net     10 ml    Weight Status:  95 kg (11/27) stable; 93.9 kg (11/15)   Estimated needs:  2340-2630 kcal, 130-150 gm protein  Nutrition Dx:  Increased nutrient needs related to brain injury and size AEB estimated needs, ongoing.  Goal:  Tolerate diet with intake of meals and supplements to meet 100% estimated needs, met.  Monitor:  Oral intake of meals and supplements, labs, weight   Joaquin Courts, RD, LDN, CNSC Pager# [phone] After Hours Pager# [phone]

## 2012-09-02 NOTE — Progress Notes (Addendum)
Physical Therapy Note  Patient Details  Name: Travanti Mcmanus MRN: [id number] Date of Birth: [birth year] Today's Date: 09/02/2012  5621-3086 (55 minutes) individual Pain: no reported pain Focus of treatment: Therapeutic exercise focused on activity tolerance; gait training; standing balance (static/dynamic) Treatment: Transfers - squat/pivot min assist with vcs for wc setup; Nustep Level 4 X 10 minutes with 2 rest breaks for general activity tolerance; standing - alternate stepping to targets on floor min /mod assist with balance (pt unable to recover loss of balance without assist); standing X 1 minutes with either right or left foot on 4 inch step with bedside table in front . Pt required vcs to correct increase lean to left in right LE weightbearing; gait 50 feet X 2 bilateral PFRW  Mod assist for guiding AD and tactile cues for standing erect.   1300-1340 (40 minutes) individual Pain: no reported pain Focus of treatment: gait training with appropriate assistive device Treatment: wc mobility - occasional min assist using bilateral LEs for propulsion ; Kinetron (min assist)  in standing 3 X 20 reps without UE assist to facilitate alternate weight shifts and challenge balance; gait 80 feet PFRW min/mod for guiding and balance + mod vcs to stay closer to AD for safety/balance; transfer wc to bed with vcs for second brake and close SBA for squat/pivot (without cues for technique).  Alanzo Lamb,[name] 09/02/2012, 7:23 AM

## 2012-09-02 NOTE — Progress Notes (Signed)
ANTICOAGULATION CONSULT NOTE - Follow Up Consult  Pharmacy Consult:  Coumadin Indication:  RUE DVT  Allergies  Allergen Reactions  . Penicillins    Patient Measurements: Weight: 209 lb 7 oz (95 kg)  Vital Signs: Temp: 98 F (36.7 C) (12/02 0500) Temp src: Oral (12/02 0500) BP: 158/84 mmHg (12/02 0500) Pulse Rate: 60  (12/02 0500)  Labs:  Alvira Philips 09/02/12 0610 09/01/12 0745 08/31/12 0620  HGB -- -- --  HCT -- -- --  PLT -- -- --  APTT -- -- --  LABPROT 21.3* 21.5* 22.8*  INR 1.93* 1.95* 2.11*  HEPARINUNFRC -- -- --  CREATININE -- -- --  CKTOTAL -- -- --  CKMB -- -- --  TROPONINI -- -- --   Assessment: 61 YOM admitted to rehab s/p TBI / multi-trauma with hemorrhagic contusion to the head.  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  He developed and upper extremity DVT and is now continued on Coumadin.  INR is slightly low at 1.93. No CBC today, No bleeding noted.  Goal of Therapy:  INR 2 - 3 (aim for 2 - 2.5 d/t recent TBI with [facility])   Plan:  1. Repeat Coumadin 7.5mg  PO x 1 tonight 2. F/u AM INR  Thanks, Yanet Balliet K. Allena Katz, PharmD, BCPS.  Clinical Pharmacist Pager [phone]. 09/02/2012 10:50 AM

## 2012-09-02 NOTE — Progress Notes (Signed)
Occupational Therapy Session Note  Patient Details  Name: Alex Foster MRN: [id number] Date of Birth: Aug 09, 1946  Today's Date: 09/02/2012 Time: 4696-2952 Time Calculation (min): 45 min  Short Term Goals: Week 4:  OT Short Term Goal 1 (Week 4): Pt will don shirt wtih Mod A OT Short Term Goal 2 (Week 4): Pt will perform LB dressing wtih mod A OT Short Term Goal 3 (Week 4): Pt will perform grooming when not in pain with min A OT Short Term Goal 4 (Week 4): Pt will demonstrate overall memory with min A  Skilled Therapeutic Interventions/Progress Updates:    1:1 self care retraining focus on activity tolerance, frustration tolerance with self and situation, use of hemi dressing techniques with encouragement and support, sit to stands without UE support (maintaining weight bearing precautions), functional ambulation into bathroom with bilateral platform RW with min A with mod A to steer RW with turning, functional use of left UE with active assist, simple problem solving, memory, emergent to anticipatory awareness.  Therapy Documentation Precautions:  Precautions Precautions: Fall Precaution Comments: L shoulder subluxation and L side hemiparesis. Peg tube , right wrist fracture: in a short arm cast  Required Braces or Orthoses: Other Brace/Splint (R arm cast) Other Brace/Splint: short arm cast R UE Restrictions Weight Bearing Restrictions: Yes RUE Weight Bearing: Non weight bearing Pain: Pain Assessment Pain Assessment: 0-10 Pain Score:   2 Pain Type: Acute pain Pain Location: Back Pain Orientation: Upper Pain Descriptors: Aching Pain Frequency: Intermittent Pain Onset: Gradual Pain Intervention(s): Medication (See eMAR)  See FIM for current functional status  Therapy/Group: Individual Therapy  [name] [facility] 09/02/2012, 11:14 AM

## 2012-09-02 NOTE — Progress Notes (Signed)
Recreational Therapy Session Note  Patient Details  Name: Alex Foster MRN: [id number] Date of Birth: April 18, 1946 Today's Date: 09/02/2012 Time:  1030-1110 Pain: no c/o Skilled Therapeutic Interventions/Progress Updates: Session focused on dynamic standing balance, standing tolerance, & LUE use during ball tap activity with min- mod assist.  Therapy/Group: Co-Treatment   Tyton Abdallah 09/02/2012, 11:58 AM

## 2012-09-02 NOTE — Progress Notes (Signed)
Physical Therapy Note  Patient Details  Name: Alex Foster MRN: [id number] Date of Birth: 1946-09-16 Today's Date: 09/02/2012  Patient missed 30 minutes physical therapy this PM. Attempted to make-up 30 minutes of therapy time, but patient unwilling to participate stating "it isn't on my schedule at this time and I just had my pain meds which make me tired. Maybe we can do extra tomorrow." Will follow up when able.  Zella Richer Mazal Ebey S. Kyal Arts, PT, DPT  09/02/2012, 3:44 PM

## 2012-09-02 NOTE — Progress Notes (Signed)
Speech Language Pathology Daily Session Note  Patient Details  Name: Alex Foster MRN: [id number] Date of Birth: 25-Jun-1946  Today's Date: 09/02/2012 Time: 0835-0900 Time Calculation (min): 25 min  Short Term Goals: Week 3: SLP Short Term Goal 1 (Week 3): Pt will demonstrate problem solving for mildly complex tasks with Mod A verbal cues.  SLP Short Term Goal 2 (Week 3): Pt will demonstrate alternating attention between two functional tasks with Mod A verbal cues.  SLP Short Term Goal 3 (Week 3): Pt will demonstrate recall new, daily information with supervision verbal cues.  SLP Short Term Goal 4 (Week 3): Pt will consume Dys. 2 textures and nectar-thick liquids with supervision verbal cues for utilization of compensatory strategies.   Skilled Therapeutic Interventions: Skilled treatment session focused on dysphagia goals. SLP facilitated session with trials of thin liquids via cup (10oz) with no overt s/s of aspiration and Dys.3 trails of cracker with throat clear x1.  Patient demonstrated a mild delay in swallow initiation and given previous MBSS observation of silent penetration with thin.  It is recommended that this patient initiate the water protocol at this time.  SLP educated patient regarding procedures for having water.      FIM:  Comprehension Comprehension Mode: Auditory Comprehension: 5-Follows basic conversation/direction: With extra time/assistive device Expression Expression Mode: Verbal Expression: 5-Expresses complex 90% of the time/cues < 10% of the time Social Interaction Social Interaction: 5-Interacts appropriately 90% of the time - Needs monitoring or encouragement for participation or interaction. Problem Solving Problem Solving: 5-Solves basic 90% of the time/requires cueing < 10% of the time Memory Memory: 4-Recognizes or recalls 75 - 89% of the time/requires cueing 10 - 24% of the time FIM - Eating Eating Activity: 5: Set-up assist for open  containers  Pain Pain Assessment Pain Assessment: No/denies pain  Therapy/Group: Individual Therapy  Charlane Ferretti., CCC-SLP [phone]  Alex Foster 09/02/2012, 2:11 PM

## 2012-09-02 NOTE — Progress Notes (Signed)
Occupational Therapy Note  Patient Details  Name: Alex Foster MRN: [id number] Date of Birth: [birth year] Today's Date: 09/02/2012  Time In:  10:00 Time Out:  10:30.  Individual session.  Patient complains of chronic back pain with standing activities and states this is long standing.  Relieved by taking a short break in sitting.  Treatment focused on sit to stand, standing balance, therapeutic activities with emphasis on  activity tolerance and dynamic standing balance, chair to mat transfers.  Patient able to complete 3/5 transfers with very close supervision and mod vc's, 2/5 with min assist.  Patient participated well.    Norton Pastel 09/02/2012, 10:37 AM

## 2012-09-02 NOTE — Progress Notes (Signed)
Patient ID: Alex Foster, male   DOB: Feb 08, 1946, 66 y.o.   MRN: [id number]  Subjective/Complaints:  need to go to the bathroom. Pain under control. Right wrist sometimes sore.   A 12 point review of systems has been performed and if not noted above is otherwise negative.   Objective: Vital Signs: Blood pressure 158/84, pulse 60, temperature 98 F (36.7 C), temperature source Oral, resp. rate 18, weight 95 kg (209 lb 7 oz), SpO2 96.00%. No results found. Results for orders placed during the hospital encounter of 08/08/12 (from the past 72 hour(s))  GLUCOSE, CAPILLARY     Status: Abnormal   Collection Time   08/30/12 11:25 AM      Component Value Range Comment   Glucose-Capillary 157 (*) 70 - 99 mg/dL    Comment 1 Notify RN     GLUCOSE, CAPILLARY     Status: Abnormal   Collection Time   08/30/12  4:22 PM      Component Value Range Comment   Glucose-Capillary 186 (*) 70 - 99 mg/dL    Comment 1 Notify RN     URINE CULTURE     Status: Normal   Collection Time   08/30/12  6:01 PM      Component Value Range Comment   Specimen Description URINE, CLEAN CATCH      Special Requests NONE      Culture  Setup Time 08/21/2012 18:59      Colony Count 3,000 COLONIES/ML      Culture INSIGNIFICANT GROWTH      Report Status 08/31/2012 FINAL     URINALYSIS, ROUTINE W REFLEX MICROSCOPIC     Status: Normal   Collection Time   08/30/12  6:01 PM      Component Value Range Comment   Color, Urine YELLOW  YELLOW    APPearance CLEAR  CLEAR    Specific Gravity, Urine 1.025  1.005 - 1.030    pH 6.0  5.0 - 8.0    Glucose, UA NEGATIVE  NEGATIVE mg/dL    Hgb urine dipstick NEGATIVE  NEGATIVE    Bilirubin Urine NEGATIVE  NEGATIVE    Ketones, ur NEGATIVE  NEGATIVE mg/dL    Protein, ur NEGATIVE  NEGATIVE mg/dL    Urobilinogen, UA 1.0  0.0 - 1.0 mg/dL    Nitrite NEGATIVE  NEGATIVE    Leukocytes, UA NEGATIVE  NEGATIVE MICROSCOPIC NOT DONE ON URINES WITH NEGATIVE PROTEIN, BLOOD, LEUKOCYTES, NITRITE, OR  GLUCOSE <1000 mg/dL.  GLUCOSE, CAPILLARY     Status: Abnormal   Collection Time   08/30/12  9:20 PM      Component Value Range Comment   Glucose-Capillary 197 (*) 70 - 99 mg/dL    Comment 1 Notify RN     PROTIME-INR     Status: Abnormal   Collection Time   08/31/12  6:20 AM      Component Value Range Comment   Prothrombin Time 22.8 (*) 11.6 - 15.2 seconds    INR 2.11 (*) 0.00 - 1.49   GLUCOSE, CAPILLARY     Status: Abnormal   Collection Time   08/31/12  8:01 AM      Component Value Range Comment   Glucose-Capillary 125 (*) 70 - 99 mg/dL    Comment 1 Notify RN     GLUCOSE, CAPILLARY     Status: Abnormal   Collection Time   08/31/12 11:45 AM      Component Value Range Comment   Glucose-Capillary 124 (*) 70 -  99 mg/dL   GLUCOSE, CAPILLARY     Status: Abnormal   Collection Time   08/31/12  4:32 PM      Component Value Range Comment   Glucose-Capillary 116 (*) 70 - 99 mg/dL   GLUCOSE, CAPILLARY     Status: Abnormal   Collection Time   08/31/12  8:39 PM      Component Value Range Comment   Glucose-Capillary 132 (*) 70 - 99 mg/dL    Comment 1 Notify RN     PROTIME-INR     Status: Abnormal   Collection Time   09/01/12  7:45 AM      Component Value Range Comment   Prothrombin Time 21.5 (*) 11.6 - 15.2 seconds    INR 1.95 (*) 0.00 - 1.49   GLUCOSE, CAPILLARY     Status: Abnormal   Collection Time   09/01/12  7:46 AM      Component Value Range Comment   Glucose-Capillary 103 (*) 70 - 99 mg/dL   GLUCOSE, CAPILLARY     Status: Abnormal   Collection Time   09/01/12 11:09 AM      Component Value Range Comment   Glucose-Capillary 156 (*) 70 - 99 mg/dL   GLUCOSE, CAPILLARY     Status: Abnormal   Collection Time   09/01/12  4:08 PM      Component Value Range Comment   Glucose-Capillary 116 (*) 70 - 99 mg/dL   GLUCOSE, CAPILLARY     Status: Abnormal   Collection Time   09/01/12  8:57 PM      Component Value Range Comment   Glucose-Capillary 171 (*) 70 - 99 mg/dL   PROTIME-INR      Status: Abnormal   Collection Time   09/02/12  6:10 AM      Component Value Range Comment   Prothrombin Time 21.3 (*) 11.6 - 15.2 seconds    INR 1.93 (*) 0.00 - 1.49     HENT:  Patient with multiple healing abrasions.  Neck:  Tracheostomy tube in place,  No drainage. Able to speak with PMV with good vocal quality Cardiovascular: Normal rate and regular rhythm.  Pulmonary/Chest:  Decreased breath sounds at the bases coarse upper airway sounds still. No distress. Abdominal: Soft. He exhibits no distension.  Gastrostomy tube in place with some scant drainage around tube without odor.  Neurological: Reflex Scores:  Tricep reflexes are 2+ on the right side and 3+ on the left side.  Bicep reflexes are 2+ on the right side and 3+ on the left side.  Brachioradialis reflexes are 2+ on the right side and 3+ on the left side.  Patellar reflexes are 2+ on the right side and 3+ on the left side.  Achilles reflexes are 2+ on the right side and 3+ on the left side.  very alert, answers simple questions appropriately. Calls to use the bathroom. L field cut seems to be biggest visual issue at present. Exam more consistent. 4 /5 strength in the left deltoid bicep tricep and grip 4/5 in the right deltoid bicep tricep and grip 3 + to 4 in the left hip extensor knee extensor synergistic pattern and 4/5 in the right lower extremity     Assessment/Plan: 1. Functional deficits secondary to Severe TBI multitrauma which require 3+ hours per day of interdisciplinary therapy in a comprehensive inpatient rehab setting. Physiatrist is providing close team supervision and 24 hour management of active medical problems listed below. Physiatrist and rehab team continue to assess  barriers to discharge/monitor patient progress toward functional and medical goals.  Placement pending  FIM: FIM - Bathing Bathing Steps Patient Completed: Chest;Abdomen;Front perineal area;Right upper leg;Left upper leg Bathing: 3:  Mod-Patient completes 5-7 33f 10 parts or 50-74%  FIM - Upper Body Dressing/Undressing Upper body dressing/undressing steps patient completed: Thread/unthread right sleeve of pullover shirt/dresss;Thread/unthread left sleeve of pullover shirt/dress Upper body dressing/undressing: 3: Mod-Patient completed 50-74% of tasks FIM - Lower Body Dressing/Undressing Lower body dressing/undressing steps patient completed: Thread/unthread right underwear leg;Thread/unthread left underwear leg;Thread/unthread right pants leg;Thread/unthread left pants leg;Fasten/unfasten right shoe;Fasten/unfasten left shoe Lower body dressing/undressing: 3: Mod-Patient completed 50-74% of tasks  FIM - Toileting Toileting steps completed by patient: Adjust clothing prior to toileting;Performs perineal hygiene Toileting Assistive Devices: Grab bar or rail for support Toileting: 3: Mod-Patient completed 2 of 3 steps  FIM - Diplomatic Services operational officer Devices: Grab bars Toilet Transfers: 4-From toilet/BSC: Min A (steadying Pt. > 75%);4-To toilet/BSC: Min A (steadying Pt. > 75%)  FIM - Bed/Chair Transfer Bed/Chair Transfer Assistive Devices: Arm rests Bed/Chair Transfer: 4: Chair or W/C > Bed: Min A (steadying Pt. > 75%)  FIM - Locomotion: Wheelchair Distance: 100 Locomotion: Wheelchair: 2: Travels 50 - 149 ft with minimal assistance (Pt.>75%) FIM - Locomotion: Ambulation Locomotion: Ambulation Assistive Devices: TEFL teacher Ambulation/Gait Assistance: 3: Mod assist Locomotion: Ambulation: 2: Travels 50 - 149 ft with moderate assistance (Pt: 50 - 74%)  Medical Problem List and Plan:  1. 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 DVT Prophylaxis/Anticoagulation: Right upper extremity DVT. Continue Coumadin therapy. Monitor platelet counts any signs of bleeding  3. pain management. Oxycodone 10 mg every 6 hours as needed  4. Mood: Amantadine 100 mg 2 times a day,  Klonopin0.5 mg twice a day, wean off, Inderal 30 mg every 12 hours, Ativan 1 mg every 4 as needed anxiety.t  Continue to monitor and modify behavior program,   -celexa qhs Agitation at noc increased Seroquel to 50mg ---will wean this also 5. Neuropsych: This patient is not capable of making decisions on his/her own behalf.  6. Multiple facial fractures. Conservative care per ENT Dr. Emeline Darling  7. L1 transverse process fracture. Conservative care,   scheduled oxycodone  8. VDRF/tracheostomy.     -trach out- site closed 9. Gastrostomy tube 07/03/2012.  .   On  D2/nectar diet --eating well  -bmet ok. Eating quite well. Drinking adequate liquids  -probably will leave in G-tube until he's on regular liquids 10. Bilateral pneumothorax. Resolved and chest tubes removed  11. MRSA tracheobronchitis. Vancomycin completed after 14 days. Afebrile, no cough  -pulmonary mentions abx course. i can't see where that it's indicated at this time  -continue close observation for fever, sx 12. History diabetes mellitus. Continue Lantus insulin and Glucophage as directed. Check blood sugars routine   -observe as we transition to diet 13. Urinary retention.Flomax 0.8 mg daily. Patient currently has a condom catheter. Normal PVRs x3.   -odor-check ua and culture 14.  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    R ulnar fx -cast - further separation on follow up xray    -ortho reviewed films-ordered replacement of SAC  -pt has better awareness now to protect the hand  LOS (Days) 25 A FACE TO FACE EVALUATION WAS PERFORMED  Audelia Knape T 09/02/2012, 7:46 AM

## 2012-09-02 NOTE — Progress Notes (Signed)
Physical Therapy Note  Patient Details  Name: Alex Foster MRN: [id number] Date of Birth: [birth year] Today's Date: 09/02/2012  Time: 1130-1200 30 minutes  No c/o pain.  Treatment focused on increasing independence with transfers.  Pt able to perform stand pivot transfer with close supervision x 2 attempts.  Required min steadying assist x 5 attempts for stand pivot transfer.  W/c mobility with min A for household propulsion, supervision controlled environment.  Pt improving balance and activity tolerance.  Individual therapy   Baine Decesare 09/02/2012, 3:07 PM

## 2012-09-03 ENCOUNTER — Inpatient Hospital Stay (HOSPITAL_COMMUNITY): Payer: Medicaid Other | Admitting: Occupational Therapy

## 2012-09-03 ENCOUNTER — Encounter (HOSPITAL_COMMUNITY): Payer: Self-pay | Admitting: Occupational Therapy

## 2012-09-03 ENCOUNTER — Inpatient Hospital Stay (HOSPITAL_COMMUNITY): Payer: Medicaid Other | Admitting: Physical Therapy

## 2012-09-03 DIAGNOSIS — S069X9A Unspecified intracranial injury with loss of consciousness of unspecified duration, initial encounter: Secondary | ICD-10-CM

## 2012-09-03 LAB — GLUCOSE, CAPILLARY
Glucose-Capillary: 134 mg/dL — ABNORMAL HIGH (ref 70–99)
Glucose-Capillary: 203 mg/dL — ABNORMAL HIGH (ref 70–99)
Glucose-Capillary: 100 mg/dL — ABNORMAL HIGH (ref 70–99)
Glucose-Capillary: 124 mg/dL — ABNORMAL HIGH (ref 70–99)

## 2012-09-03 MED ORDER — SENNOSIDES-DOCUSATE SODIUM 8.6-50 MG PO TABS
2.0000 | ORAL_TABLET | Freq: Two times a day (BID) | ORAL | Status: DC
  Administered 2012-09-03 – 2012-09-11 (×15): 2 via ORAL
  Filled 2012-09-03 (×5): qty 2
  Filled 2012-09-03: qty 1
  Filled 2012-09-03 (×2): qty 2
  Filled 2012-09-03: qty 1
  Filled 2012-09-03 (×4): qty 2

## 2012-09-03 MED ORDER — WARFARIN SODIUM 10 MG PO TABS
10.0000 mg | ORAL_TABLET | Freq: Once | ORAL | Status: AC
  Administered 2012-09-03: 10 mg via ORAL
  Filled 2012-09-03: qty 1

## 2012-09-03 NOTE — Progress Notes (Signed)
Occupational Therapy Session Note  Patient Details  Name: Alex Foster MRN: [id number] Date of Birth: Mar 19, 1946  Today's Date: 09/03/2012 Time: 4010-2725 Time Calculation (min): 60 min  Skilled Therapeutic Interventions/Progress Updates:    Pt perseverating on having a bowel movement during session.  Performed 3 toilet transfers, walking into and out of the bathroom with mod facilitation, hand held assist.  Pt needing assistance with pulling pants up and for peri hygiene after finishing, but able to push his pants down on each occasion.  Needs mod instructional cues to not attempt weightbearing over the LUE for sit to stand.  Also worked on [employer] and strengthening in supine position.  Focus on shoulder flexion to 90 degrees, horizontal abduction and internal rotation.  Performed 1 set of 15 reps before pt needed to return to the bathroom.  Left pt with nursing to assist with disimpacation.  Therapy Documentation Precautions:  Precautions Precautions: Fall Precaution Comments: L shoulder subluxation and L side hemiparesis. Peg tube , right wrist fracture: in a short arm cast  Required Braces or Orthoses: Other Brace/Splint (R arm cast) Other Brace/Splint: short arm cast R UE Restrictions Weight Bearing Restrictions: Yes RUE Weight Bearing: Non weight bearing  Pain: Pain Assessment Pain Assessment: Faces Faces Pain Scale: Hurts little more Pain Type: Acute pain Pain Location: Abdomen Pain Descriptors: Cramping Pain Intervention(s): RN made aware;Repositioned;Medication (See eMAR) ADL: See FIM for current functional status  Therapy/Group: Individual Therapy  Mica Releford OTR/L 09/03/2012, 10:57 AM

## 2012-09-03 NOTE — Progress Notes (Signed)
Occuaptional Therapy Note  Patient Details  Name: Alex Foster MRN: [id number] Date of Birth: Jan 13, 1946 Today's Date: 09/03/2012  Patient was scheduled for [employer] today at 11:30.  Patient came but stated he felt sick to his stomach and couldn't eat. Patient requested he be returned to his room.  Patient was taken to his room and nursing was notified. Patient missed 30 minutes of therapy.   Norton Pastel 09/03/2012, 5:56 PM

## 2012-09-03 NOTE — Progress Notes (Signed)
Physical Therapy Session Note  Patient Details  Name: Alex Foster MRN: [id number] Date of Birth: May 14, 1946  Today's Date: 09/03/2012 Time: 4403-4742 Time Calculation (min): 54 min  Short Term Goals: Week 1:  PT Short Term Goal 1 (Week 1): Pt will perform bed mobility with Mod A  PT Short Term Goal 1 - Progress (Week 1): Met PT Short Term Goal 2 (Week 1): Pt will perform bed<>chair transfer with Max A PT Short Term Goal 2 - Progress (Week 1): Met PT Short Term Goal 3 (Week 1): Pt will propell WC x 25' with Min A  PT Short Term Goal 3 - Progress (Week 1): Met PT Short Term Goal 4 (Week 1): Pt will demonstrate unsupported dynamic sitting balance with Min A for PT Short Term Goal 4 - Progress (Week 1): Met  Skilled Therapeutic Interventions/Progress Updates:  Patient performed WC propulsion for endurance with mod A 50'.  Patient performed squat/pivot transfers with min A for safe hand/foot placement due to visual deficits.  NuStep at level 4 for two 5 minute bouts with 2 minute rest between bouts for improved endurance.  Gait training with PFRW 99' x2 with mod A for guidance, upright posture and maintaining safe positioning within walker.  Patient worked on Sales executive to improve weight shifting with Kinetron standing 2 minutes x 3 bouts with 1 UE support with seated rest break between bouts; patient required min A.  Continued work on Editor, commissioning with PFRW standing while performing step-taps with alternate feet onto 4" step 10x each foot.  Session ended with squat/pivot transfer WC/bed with cuing to locate left WC brake prior to making transfer.      Therapy Documentation Pain: Pain Assessment Pain Assessment: 0-10 Pain Score:   6 Pain Type: Acute pain Pain Location: Abdomen Pain Orientation: Upper Pain Descriptors: Cramping Pain Onset: On-going Patients Stated Pain Goal: 2 Pain Intervention(s): RN made aware  See FIM for current functional  status  Therapy/Group: Individual Therapy  Rexene Agent 09/03/2012, 3:07 PM

## 2012-09-03 NOTE — Progress Notes (Signed)
ANTICOAGULATION CONSULT NOTE - Follow Up Consult  Pharmacy Consult:  Coumadin Indication:  RUE DVT  Allergies  Allergen Reactions  . Penicillins    Patient Measurements: Weight: 209 lb 7 oz (95 kg)  Vital Signs: Temp: 97.4 F (36.3 C) (12/03 0620) Temp src: Oral (12/03 0620) BP: 175/80 mmHg (12/03 0620) Pulse Rate: 67  (12/03 0620)  Labs:  Alvira Philips 09/03/12 0625 09/02/12 0610 09/01/12 0745  HGB -- -- --  HCT -- -- --  PLT -- -- --  APTT -- -- --  LABPROT 19.7* 21.3* 21.5*  INR 1.73* 1.93* 1.95*  HEPARINUNFRC -- -- --  CREATININE -- -- --  CKTOTAL -- -- --  CKMB -- -- --  TROPONINI -- -- --   Assessment: 32 YOM admitted to rehab s/p TBI / multi-trauma with hemorrhagic contusion to the head.  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  He developed and upper extremity DVT and is now continued on Coumadin.  INR continues to decrease and is now subtherapeutic at 1.73. All doses charted as given. No new drug interactions noted. No CBC today, No bleeding noted.  Goal of Therapy:  INR 2 - 3 (aim for 2 - 2.5 d/t recent TBI with [facility])   Plan:  1. Coumadin 10 mg PO x 1 tonight 2. F/u AM INR  Thanks, Edelmiro Innocent K. Allena Katz, PharmD, BCPS.  Clinical Pharmacist Pager [phone]. 09/03/2012 10:17 AM

## 2012-09-03 NOTE — Patient Care Conference (Signed)
Inpatient RehabilitationTeam Conference Note Date: 09/03/2012   Time: 2:45 PM    Patient Name: Alex Foster      Medical Record Number: [id number]  Date of Birth: 1946-05-02 Sex: Male         Room/Bed: 4002/4002-01 Payor Info: Payor: MEDICAID [place]  Plan: MEDICAID [place] ACCESS  Product Type: *No Product type*     Admitting Diagnosis: TRAUMA TBI AND CVA  Admit Date/Time:  08/08/2012  3:19 PM Admission Comments: No comment available   Primary Diagnosis:  Trauma Principal Problem: Trauma  Patient Active Problem List   Diagnosis Date Noted  . Trauma 08/09/2012  . DM (diabetes mellitus) 08/03/2012  . DVT of axillary vein, acute right 08/03/2012  . Anticoagulation goal of INR 2 to 3 08/03/2012  . Hypertension   . Myocardial infarction   . Pleural effusion 06/09/2012  . Mobitz (type) II atrioventricular block 06/04/2012  . Acute respiratory failure with hypoxia 06/02/2012  . Cardiac arrest 06/02/2012  . Cardiogenic shock 06/02/2012  . Tracheobronchitis, MRSA 06/02/2012  . Leukocytosis 06/01/2012  . Lactic acidosis 06/01/2012  . Multiple rib fractures 05/31/2012  . 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 Extensive facial fractures 05/31/2012  . Leg erythema 05/11/2012  . Health care maintenance 02/29/2012  . Dizziness 07/18/2011  . Hepatic encephalopathy 04/12/2011  . Low back pain 03/29/2011  . Physical deconditioning 03/28/2011  . Thrombocytopenia 03/24/2011  . Anasarca 03/24/2011  . Knee pain, left 02/21/2011  . Constipation 11/22/2010  . OTHER DISORDERS OF BONE AND CARTILAGE OTHER 08/17/2010  . RBBB 02/03/2010  . INSOMNIA, CHRONIC 12/16/2009  . BENIGN POSITIONAL VERTIGO 08/10/2008  . HYPERLIPIDEMIA, CONTROLLED 12/04/2007  . DIABETES MELLITUS, TYPE II, WITHOUT COMPLICATIONS 08/23/2007  . TOBACCO ABUSE 05/08/2007  . HYPERTENSION, BENIGN ESSENTIAL, CONTROLLED 05/08/2007  . COPD 05/08/2007    Expected Discharge Date: Expected Discharge Date: 09/06/12  Team Members  Present: Physician: Dr. Faith Rogue Social Worker Present: Amada Jupiter, LCSW Nurse Present: Carlean Purl, RN PT Present: Reggy Eye, PT OT Present: [name], OT;Ardis Rowan, COTA SLP Present: Feliberto Gottron, SLP Other (Discipline and Name): Tora Duck, PPS Coordinator     Current Status/Progress Goal Weekly Team Focus  Medical   see prior. eating well.  no change  see prior   Bowel/Bladder   continent bowel and bladder.  LBM 11/29, but refusing supp.  Agreed to take Sorbitol  continent bowel and bladder  continue to monitor   Swallow/Nutrition/ Hydration   Dys. 2 textures with nectar thick liquids, Supervision for utilization of strategies  Min A  trials of thin liquids and Dys. 3 textures.    ADL's   steady A,toileting mod A, bathing and dressing mod A  overall mod A, min A transfers  funcitonal use of left UE, dynamic standing balance, activity tolerance   Mobility   min A  min A  balance, activity tolerance, gait training   Communication   Supervision  Min A  Multi-step directions.    Safety/Cognition/ Behavioral Observations  Min A with complex  Min A  awareness, problem solving, alternating attention    Pain   receives oxy  10 mg prn for pain with relief  decrease pain levels   free of pain   Skin   PEG site continues to be red and tender;  dsg changes BID with Neosporin cream  no new skin breakdown  remain free of breakdown and infection    Rehab Goals Patient on target to meet rehab goals: Yes *See Interdisciplinary Assessment  and Plan and progress notes for long and short-term goals  Barriers to Discharge: no change    Possible Resolutions to Barriers:  no change    Discharge Planning/Teaching Needs:  SNF      Team Discussion:  Continues to make gains.  Started H2O protocol but still coughing with thin liquids.    Revisions to Treatment Plan:  None   Continued Need for Acute Rehabilitation Level of Care: The patient requires daily medical  management by a physician with specialized training in physical medicine and rehabilitation for the following conditions: Daily direction of a multidisciplinary physical rehabilitation program to ensure safe treatment while eliciting the highest outcome that is of practical value to the patient.: Yes Daily medical management of patient stability for increased activity during participation in an intensive rehabilitation regime.: Yes Daily analysis of laboratory values and/or radiology reports with any subsequent need for medication adjustment of medical intervention for : Post surgical problems;Pulmonary problems;Neurological problems  Aniza Shor 09/03/2012, 4:29 PM

## 2012-09-03 NOTE — Progress Notes (Signed)
Patient ID: Alex Foster, male   DOB: March 22, 1946, 66 y.o.   MRN: [id number]  Subjective/Complaints:  slept fairly well. Anxious to go. Anxious to get g tube.   A 12 point review of systems has been performed and if not noted above is otherwise negative.   Objective: Vital Signs: Blood pressure 175/80, pulse 67, temperature 97.4 F (36.3 C), temperature source Oral, resp. rate 18, weight 95 kg (209 lb 7 oz), SpO2 96.00%. No results found. Results for orders placed during the hospital encounter of 08/08/12 (from the past 72 hour(s))  GLUCOSE, CAPILLARY     Status: Abnormal   Collection Time   08/31/12  8:01 AM      Component Value Range Comment   Glucose-Capillary 125 (*) 70 - 99 mg/dL    Comment 1 Notify RN     GLUCOSE, CAPILLARY     Status: Abnormal   Collection Time   08/31/12 11:45 AM      Component Value Range Comment   Glucose-Capillary 124 (*) 70 - 99 mg/dL   GLUCOSE, CAPILLARY     Status: Abnormal   Collection Time   08/31/12  4:32 PM      Component Value Range Comment   Glucose-Capillary 116 (*) 70 - 99 mg/dL   GLUCOSE, CAPILLARY     Status: Abnormal   Collection Time   08/31/12  8:39 PM      Component Value Range Comment   Glucose-Capillary 132 (*) 70 - 99 mg/dL    Comment 1 Notify RN     PROTIME-INR     Status: Abnormal   Collection Time   09/01/12  7:45 AM      Component Value Range Comment   Prothrombin Time 21.5 (*) 11.6 - 15.2 seconds    INR 1.95 (*) 0.00 - 1.49   GLUCOSE, CAPILLARY     Status: Abnormal   Collection Time   09/01/12  7:46 AM      Component Value Range Comment   Glucose-Capillary 103 (*) 70 - 99 mg/dL   GLUCOSE, CAPILLARY     Status: Abnormal   Collection Time   09/01/12 11:09 AM      Component Value Range Comment   Glucose-Capillary 156 (*) 70 - 99 mg/dL   GLUCOSE, CAPILLARY     Status: Abnormal   Collection Time   09/01/12  4:08 PM      Component Value Range Comment   Glucose-Capillary 116 (*) 70 - 99 mg/dL   GLUCOSE, CAPILLARY      Status: Abnormal   Collection Time   09/01/12  8:57 PM      Component Value Range Comment   Glucose-Capillary 171 (*) 70 - 99 mg/dL   PROTIME-INR     Status: Abnormal   Collection Time   09/02/12  6:10 AM      Component Value Range Comment   Prothrombin Time 21.3 (*) 11.6 - 15.2 seconds    INR 1.93 (*) 0.00 - 1.49   GLUCOSE, CAPILLARY     Status: Abnormal   Collection Time   09/02/12  7:55 AM      Component Value Range Comment   Glucose-Capillary 108 (*) 70 - 99 mg/dL    Comment 1 Notify RN     GLUCOSE, CAPILLARY     Status: Abnormal   Collection Time   09/02/12 11:46 AM      Component Value Range Comment   Glucose-Capillary 100 (*) 70 - 99 mg/dL    Comment 1 Notify  RN     GLUCOSE, CAPILLARY     Status: Abnormal   Collection Time   09/02/12  4:23 PM      Component Value Range Comment   Glucose-Capillary 148 (*) 70 - 99 mg/dL    Comment 1 Notify RN     GLUCOSE, CAPILLARY     Status: Normal   Collection Time   09/02/12  8:46 PM      Component Value Range Comment   Glucose-Capillary 97  70 - 99 mg/dL    Comment 1 Notify RN       HENT:  Patient with multiple healing abrasions.  Neck:  Tracheostomy tube in place,  No drainage. Able to speak with PMV with good vocal quality Cardiovascular: Normal rate and regular rhythm.  Pulmonary/Chest:  Decreased breath sounds at the bases coarse upper airway sounds still. No distress. Abdominal: Soft. He exhibits no distension.  Gastrostomy tube in place with some scant drainage around tube without odor.  Neurological: Reflex Scores:  Tricep reflexes are 2+ on the right side and 3+ on the left side.  Bicep reflexes are 2+ on the right side and 3+ on the left side.  Brachioradialis reflexes are 2+ on the right side and 3+ on the left side.  Patellar reflexes are 2+ on the right side and 3+ on the left side.  Achilles reflexes are 2+ on the right side and 3+ on the left side.  very alert, answers simple questions appropriately. Calls to use  the bathroom. L field cut seems to be biggest visual issue at present. Exam more consistent. 4 /5 strength in the left deltoid bicep tricep and grip 4/5 in the right deltoid bicep tricep and grip 3 + to 4 in the left hip extensor knee extensor synergistic pattern and 4/5 in the right lower extremity     Assessment/Plan: 1. Functional deficits secondary to Severe TBI multitrauma which require 3+ hours per day of interdisciplinary therapy in a comprehensive inpatient rehab setting. Physiatrist is providing close team supervision and 24 hour management of active medical problems listed below. Physiatrist and rehab team continue to assess barriers to discharge/monitor patient progress toward functional and medical goals.  Placement pending  FIM: FIM - Bathing Bathing Steps Patient Completed: Chest;Abdomen;Right Arm;Right upper leg;Left upper leg Bathing: 3: Mod-Patient completes 5-7 97f 10 parts or 50-74%  FIM - Upper Body Dressing/Undressing Upper body dressing/undressing steps patient completed: Thread/unthread right sleeve of pullover shirt/dresss;Thread/unthread left sleeve of pullover shirt/dress Upper body dressing/undressing: 3: Mod-Patient completed 50-74% of tasks FIM - Lower Body Dressing/Undressing Lower body dressing/undressing steps patient completed: Thread/unthread right underwear leg;Thread/unthread right pants leg;Thread/unthread left pants leg;Fasten/unfasten right shoe;Fasten/unfasten left shoe Lower body dressing/undressing: 3: Mod-Patient completed 50-74% of tasks  FIM - Toileting Toileting steps completed by patient: Adjust clothing prior to toileting;Performs perineal hygiene Toileting Assistive Devices: Grab bar or rail for support Toileting: 3: Mod-Patient completed 2 of 3 steps  FIM - Diplomatic Services operational officer Devices: Grab bars Toilet Transfers: 4-To toilet/BSC: Min A (steadying Pt. > 75%);4-From toilet/BSC: Min A (steadying Pt. > 75%)  FIM -  Bed/Chair Transfer Bed/Chair Transfer Assistive Devices: Arm rests Bed/Chair Transfer: 4: Bed > Chair or W/C: Min A (steadying Pt. > 75%);4: Supine > Sit: Min A (steadying Pt. > 75%/lift 1 leg)  FIM - Locomotion: Wheelchair Distance: 100 Locomotion: Wheelchair: 2: Travels 50 - 149 ft with minimal assistance (Pt.>75%) FIM - Locomotion: Ambulation Locomotion: Ambulation Assistive Devices: TEFL teacher Ambulation/Gait Assistance: 3:  Mod assist Locomotion: Ambulation: 2: Travels 50 - 149 ft with moderate assistance (Pt: 50 - 74%)  Medical Problem List and Plan:  1. 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 DVT Prophylaxis/Anticoagulation: Right upper extremity DVT. Continue Coumadin therapy. Monitor platelet counts any signs of bleeding  3. pain management. Oxycodone 10 mg every 6 hours as needed  4. Mood: Amantadine 100 mg 2 times a day, Klonopin0.5 mg  -- wean off, Inderal 30 mg every 12 hours, Ativan 1 mg every 4 as needed anxiety.t  Continue to monitor and modify behavior program,   -celexa qhs Agitation at noc increased Seroquel to 50mg ---will wean this also to off.  5. Neuropsych: This patient is not capable of making decisions on his/her own behalf.  6. Multiple facial fractures. Conservative care per ENT Dr. Emeline Darling  7. L1 transverse process fracture. Conservative care,   scheduled oxycodone  8. VDRF/tracheostomy.     -trach out- site closed 9. Gastrostomy tube 07/03/2012.  .   On  D2/nectar diet --eating well  -bmet ok. Eating quite well. Drinking adequate liquids  -probably will leave in G-tube until he's on regular liquids 10. Bilateral pneumothorax. Resolved and chest tubes removed  11. MRSA tracheobronchitis. Vancomycin completed after 14 days. Afebrile, no cough  -pulmonary mentions abx course. i can't see where that it's indicated at this time  -continue close observation for fever, sx 12. History diabetes mellitus. Continue Lantus  insulin and Glucophage as directed. Check blood sugars routine   -observe as we transition to diet 13. Urinary retention.Flomax 0.8 mg daily. Patient currently has a condom catheter. Normal PVRs x3.   -urine sample negative 14.  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    R ulnar fx -cast - further separation on follow up xray    -ortho reviewed films-ordered replacement of SAC  -pt has better awareness now to protect the hand  LOS (Days) 26 A FACE TO FACE EVALUATION WAS PERFORMED  SWARTZ,ZACHARY T 09/03/2012, 6:58 AM

## 2012-09-03 NOTE — Progress Notes (Signed)
Occupational Therapy Session Note  Patient Details  Name: Alex Foster MRN: [id number] Date of Birth: 16-Dec-1945  Today's Date: 09/03/2012 Time: 0730-0830 Time Calculation (min): 60 min   Skilled Therapeutic Interventions/Progress Updates:    1:1 self care retraining at shower level: continue to focus on activity tolerance, frustration tolerance (being patient with self), functional use of left UE with all tasks. Pt able to thread pants with only set up today with extra time and praise. Pt demonstrated good working memory and being an advocate for himself with min cuing. Stand pivot transfers steady A with mod cuing for hand placement with toilet transfers and shower stall transfers for attention to left UE and maintaining NWB through left UE.  Therapy Documentation Precautions:  Precautions Precautions: Fall Precaution Comments: L shoulder subluxation and L side hemiparesis. Peg tube , right wrist fracture: in a short arm cast  Required Braces or Orthoses: Other Brace/Splint (R arm cast) Other Brace/Splint: short arm cast R UE Restrictions Weight Bearing Restrictions: Yes RUE Weight Bearing: Non weight bearing Pain: Pain Assessment Pain Assessment: Faces Faces Pain Scale: Hurts little more Pain Type: Acute pain Pain Location: Abdomen Pain Descriptors: Cramping Pain Intervention(s): RN made aware;Repositioned;Medication (See eMAR)  See FIM for current functional status  Therapy/Group: Individual Therapy  [name] [facility] 09/03/2012, 12:15 PM

## 2012-09-03 NOTE — Progress Notes (Signed)
Physical Therapy Note  Patient Details  Name: Alex Foster MRN: [id number] Date of Birth: [birth year] Today's Date: 09/03/2012  Time: 900-930 30 minutes  No c/o pain.  Pt c/o constipation, RN made aware.  Pt agreeable to attempt PT treatment despite constipation.  W/c mobility with min A 100' x 2 with multiple seated rest breaks due to LE fatigue.  Gait training with PFRW x 120' with min A for RW control and trunk stability.  Pt then reports he needs to use restroom.  Pt becoming increasingly frustrated with RN and PT, impatient with assist to get to toilet and assist with meds.  Pt refuses to participate in last 30 minutes of PT session due to toileting, constipation issues.  Individual therapy  Cadell Gabrielson 09/03/2012, 10:39 AM

## 2012-09-03 NOTE — Progress Notes (Signed)
Recreational Therapy Session Note  Patient Details  Name: Gwyn Mehring MRN: [id number] Date of Birth: 24-Aug-1946 Today's Date: 09/03/2012  Session cancelled due to bowel issues, RN aware.  Kru Allman 09/03/2012, 12:33 PM

## 2012-09-03 NOTE — Progress Notes (Signed)
SLP Cancellation Note  Patient Details Name: Alex Foster MRN: [id number] DOB: 03/07/1946   Cancelled treatment: Pt missed 30 minutes of skilled SLP and OT treatment in [place] due to not feeling well and bowel issues. RN notified.   Alixandra Alfieri 09/03/2012, 2:33 PM

## 2012-09-04 ENCOUNTER — Inpatient Hospital Stay (HOSPITAL_COMMUNITY): Payer: Medicaid Other | Admitting: Occupational Therapy

## 2012-09-04 ENCOUNTER — Inpatient Hospital Stay (HOSPITAL_COMMUNITY): Payer: Medicaid Other | Admitting: Physical Therapy

## 2012-09-04 ENCOUNTER — Inpatient Hospital Stay (HOSPITAL_COMMUNITY): Payer: Medicaid Other | Admitting: Speech Pathology

## 2012-09-04 LAB — GLUCOSE, CAPILLARY: Glucose-Capillary: 121 mg/dL — ABNORMAL HIGH (ref 70–99)

## 2012-09-04 LAB — PROTIME-INR
INR: 1.95 — ABNORMAL HIGH (ref 0.00–1.49)
Prothrombin Time: 21.5 seconds — ABNORMAL HIGH (ref 11.6–15.2)

## 2012-09-04 MED ORDER — AMANTADINE HCL 100 MG PO CAPS
100.0000 mg | ORAL_CAPSULE | Freq: Every day | ORAL | Status: DC
  Administered 2012-09-04 – 2012-09-11 (×8): 100 mg via ORAL
  Filled 2012-09-04 (×9): qty 1

## 2012-09-04 MED ORDER — WARFARIN SODIUM 10 MG PO TABS
10.0000 mg | ORAL_TABLET | Freq: Once | ORAL | Status: AC
  Administered 2012-09-04: 10 mg via ORAL
  Filled 2012-09-04: qty 1

## 2012-09-04 MED ORDER — GUAIFENESIN 100 MG/5ML PO SOLN
300.0000 mg | Freq: Four times a day (QID) | ORAL | Status: DC | PRN
  Filled 2012-09-04: qty 15

## 2012-09-04 NOTE — Progress Notes (Signed)
Speech Language Pathology Daily Session Note  Patient Details  Name: Alex Foster MRN: [id number] Date of Birth: 1946-07-17  Today's Date: 09/04/2012 Time: 1130-1145 Time Calculation (min): 15 min  Short Term Goals: Week 4: SLP Short Term Goal 1 (Week 4): Pt will consume Dys. 3 textures and demonstrate efficient mastication without overt s/s of aspiration with  Mod I. SLP Short Term Goal 2 (Week 4): Pt will consume thin liquids via cup without overt s/s of aspiration with Min A verbal cues.  SLP Short Term Goal 3 (Week 4): Pt will demonstrate functional problem solving for mildly complex tasks with Min verbal cues.  SLP Short Term Goal 4 (Week 4): Pt will demonstrate divided attention in a group setting with Mod A verbal cues.  SLP Short Term Goal 5 (Week 4): Pt will recall new, daily information with Mod I.   Skilled Therapeutic Interventions: Co-treatment, group session focused on addressing dysphagia goals.  Patient consumed Dys.3 textures and nectar-thick liquids with supervision verbal cues to initiate self-feeding and mod assist verbal cues to attend to left upper extremity.   SLP provided patient with increased wait time to self-monitor wet vocal quality and perform intermittent throat clears; however, SLP suspects wet vocal quality is secondary to phlegm vs. penetration.    Pain Pain Assessment Pain Assessment: No/denies pain  Therapy/Group: Individual Therapy  Charlane Ferretti., CCC-SLP [phone]  Alex Foster 09/04/2012, 3:40 PM

## 2012-09-04 NOTE — Progress Notes (Signed)
Recreational Therapy Session Note  Patient Details  Name: Pasqual Farias MRN: [id number] Date of Birth: 1946/08/06 Today's Date: 09/04/2012 Time:  1300-1500  Pain: no c/o Skilled Therapeutic Interventions/Progress Updates: Participated in community reintegration/outing to coffee shop w/c level.  Pt propelled w/c on even/uneven community surfaces with feet, performed stand pivot transfers, with min A,  recalling and carrying out swallowing precautions with drinking, intellectual to emergent awareness, abstracting thinking, mental math with cost of drink and receiving change back, simple problem solving, recognizing differences in self now versus before the BI and relating it to being in the community, discussion about good life choices, short term memory with supervision.  Therapy/Group: [employer]  Braven Wolk 09/04/2012, 4:33 PM

## 2012-09-04 NOTE — Progress Notes (Signed)
Occupational Therapy Session Note  Patient Details  Name: Alex Foster MRN: [id number] Date of Birth: 1946-06-29  Today's Date: 09/04/2012 Time: 1015-1120 Time Calculation (min): 65 min  Short Term Goals: Week 4:  OT Short Term Goal 1 (Week 4): Pt will don shirt wtih Mod A OT Short Term Goal 2 (Week 4): Pt will perform LB dressing wtih mod A OT Short Term Goal 3 (Week 4): Pt will perform grooming when not in pain with min A OT Short Term Goal 4 (Week 4): Pt will demonstrate overall memory with min A  Skilled Therapeutic Interventions/Progress Updates:    1:1 self care retraining at shower level with focus on transfers, slowing down pace to improve coordination and normal patterns of movement with left UE, sit to stand with proper hand placement, discussion about outing and generating goals to work towards, recall of water protocol and carrying out steps in order to drink water from tsp. Able to self propel w/c with feet short distance before fatigue and get stuck and unable to continue (decrec momentum and traction with getting started)  Therapy Documentation Precautions:  Precautions Precautions: Fall Precaution Comments: L shoulder subluxation and L side hemiparesis. Peg tube , right wrist fracture: in a short arm cast  Required Braces or Orthoses: Other Brace/Splint (R arm cast) Other Brace/Splint: short arm cast R UE Restrictions Weight Bearing Restrictions: Yes RUE Weight Bearing: Non weight bearing Pain:  mild back pain  See FIM for current functional status  Therapy/Group: Individual Therapy  [name] [facility] 09/04/2012, 11:28 AM

## 2012-09-04 NOTE — Progress Notes (Signed)
Occupational Therapy Note  Patient Details  Name: Alex Foster MRN: [id number] Date of Birth: 1946/06/04 Today's Date: 09/04/2012  Time: 9147-8295 Pt denies pain Group therapy (cotx with Speech Therapy - total time of group 1130-1155) Pt engaged in self feeding group with focus on portion control, swallowing strategies, and increased socialization with other participants.  Pt encourage to increase use of LUE but patient adamant that he has used his right hand for 65 years.  Lavone Neri [facility] 09/04/2012, 1:05 PM

## 2012-09-04 NOTE — Progress Notes (Signed)
Patient ID: Alex Foster, male   DOB: 27-Oct-1945, 66 y.o.   MRN: [id number]  Subjective/Complaints:  slept fairly well. Anxious to go. Anxious to get g tube.   A 12 point review of systems has been performed and if not noted above is otherwise negative.   Objective: Vital Signs: Blood pressure 173/86, pulse 58, temperature 97.4 F (36.3 C), temperature source Oral, resp. rate 20, weight 95 kg (209 lb 7 oz), SpO2 96.00%. No results found. Results for orders placed during the hospital encounter of 08/08/12 (from the past 72 hour(s))  PROTIME-INR     Status: Abnormal   Collection Time   09/01/12  7:45 AM      Component Value Range Comment   Prothrombin Time 21.5 (*) 11.6 - 15.2 seconds    INR 1.95 (*) 0.00 - 1.49   GLUCOSE, CAPILLARY     Status: Abnormal   Collection Time   09/01/12  7:46 AM      Component Value Range Comment   Glucose-Capillary 103 (*) 70 - 99 mg/dL   GLUCOSE, CAPILLARY     Status: Abnormal   Collection Time   09/01/12 11:09 AM      Component Value Range Comment   Glucose-Capillary 156 (*) 70 - 99 mg/dL   GLUCOSE, CAPILLARY     Status: Abnormal   Collection Time   09/01/12  4:08 PM      Component Value Range Comment   Glucose-Capillary 116 (*) 70 - 99 mg/dL   GLUCOSE, CAPILLARY     Status: Abnormal   Collection Time   09/01/12  8:57 PM      Component Value Range Comment   Glucose-Capillary 171 (*) 70 - 99 mg/dL   PROTIME-INR     Status: Abnormal   Collection Time   09/02/12  6:10 AM      Component Value Range Comment   Prothrombin Time 21.3 (*) 11.6 - 15.2 seconds    INR 1.93 (*) 0.00 - 1.49   GLUCOSE, CAPILLARY     Status: Abnormal   Collection Time   09/02/12  7:55 AM      Component Value Range Comment   Glucose-Capillary 108 (*) 70 - 99 mg/dL    Comment 1 Notify RN     GLUCOSE, CAPILLARY     Status: Abnormal   Collection Time   09/02/12 11:46 AM      Component Value Range Comment   Glucose-Capillary 100 (*) 70 - 99 mg/dL    Comment 1 Notify RN      GLUCOSE, CAPILLARY     Status: Abnormal   Collection Time   09/02/12  4:23 PM      Component Value Range Comment   Glucose-Capillary 148 (*) 70 - 99 mg/dL    Comment 1 Notify RN     GLUCOSE, CAPILLARY     Status: Normal   Collection Time   09/02/12  8:46 PM      Component Value Range Comment   Glucose-Capillary 97  70 - 99 mg/dL    Comment 1 Notify RN     PROTIME-INR     Status: Abnormal   Collection Time   09/03/12  6:25 AM      Component Value Range Comment   Prothrombin Time 19.7 (*) 11.6 - 15.2 seconds    INR 1.73 (*) 0.00 - 1.49   GLUCOSE, CAPILLARY     Status: Abnormal   Collection Time   09/03/12  7:30 AM  Component Value Range Comment   Glucose-Capillary 134 (*) 70 - 99 mg/dL    Comment 1 Notify RN     GLUCOSE, CAPILLARY     Status: Abnormal   Collection Time   09/03/12 11:57 AM      Component Value Range Comment   Glucose-Capillary 203 (*) 70 - 99 mg/dL    Comment 1 Notify RN     GLUCOSE, CAPILLARY     Status: Abnormal   Collection Time   09/03/12  4:06 PM      Component Value Range Comment   Glucose-Capillary 100 (*) 70 - 99 mg/dL   GLUCOSE, CAPILLARY     Status: Abnormal   Collection Time   09/03/12  9:48 PM      Component Value Range Comment   Glucose-Capillary 124 (*) 70 - 99 mg/dL   PROTIME-INR     Status: Abnormal   Collection Time   09/04/12  6:15 AM      Component Value Range Comment   Prothrombin Time 21.5 (*) 11.6 - 15.2 seconds    INR 1.95 (*) 0.00 - 1.49     HENT:  Patient with multiple healing abrasions.  Neck:  Neck clean. No drainage from former trach site. Cardiovascular: Normal rate and regular rhythm.  Pulmonary/Chest:  Decreased breath sounds at the bases coarse upper airway sounds still. No distress. Abdominal: Soft. He exhibits no distension.  Gastrostomy tube in place, granulation tissue present  Neurological: Reflex Scores:  Tricep reflexes are 2+ on the right side and 3+ on the left side.  Bicep reflexes are 2+ on the right side  and 3+ on the left side.  Brachioradialis reflexes are 2+ on the right side and 3+ on the left side.  Patellar reflexes are 2+ on the right side and 3+ on the left side.  Achilles reflexes are 2+ on the right side and 3+ on the left side.  very alert, answers simple questions appropriately. Calls to use the bathroom. Has no gross visual field deficits. 4 /5 strength in the left deltoid bicep tricep and grip 4/5 in the right deltoid bicep tricep and grip   4 in the left hip extensor knee extensor synergistic pattern and 4/5 in the right lower extremity     Assessment/Plan: 1. Functional deficits secondary to Severe TBI multitrauma which require 3+ hours per day of interdisciplinary therapy in a comprehensive inpatient rehab setting. Physiatrist is providing close team supervision and 24 hour management of active medical problems listed below. Physiatrist and rehab team continue to assess barriers to discharge/monitor patient progress toward functional and medical goals.  Placement pending  FIM: FIM - Bathing Bathing Steps Patient Completed: Chest;Right Arm;Abdomen;Left upper leg;Right upper leg Bathing: 3: Mod-Patient completes 5-7 65f 10 parts or 50-74%  FIM - Upper Body Dressing/Undressing Upper body dressing/undressing steps patient completed: Thread/unthread right sleeve of pullover shirt/dresss;Thread/unthread left sleeve of pullover shirt/dress Upper body dressing/undressing: 3: Mod-Patient completed 50-74% of tasks FIM - Lower Body Dressing/Undressing Lower body dressing/undressing steps patient completed: Thread/unthread right underwear leg;Thread/unthread left underwear leg;Thread/unthread right pants leg;Thread/unthread left pants leg;Don/Doff left shoe;Fasten/unfasten right shoe;Fasten/unfasten left shoe;Don/Doff right shoe Lower body dressing/undressing: 3: Mod-Patient completed 50-74% of tasks  FIM - Toileting Toileting steps completed by patient: Adjust clothing prior to  toileting Toileting Assistive Devices: Grab bar or rail for support Toileting: 2: Max-Patient completed 1 of 3 steps  FIM - Diplomatic Services operational officer Devices: Grab bars Toilet Transfers: 4-To toilet/BSC: Min A (steadying Pt. >  75%);4-From toilet/BSC: Min A (steadying Pt. > 75%)  FIM - Bed/Chair Transfer Bed/Chair Transfer Assistive Devices: Bed rails Bed/Chair Transfer: 5: Supine > Sit: Supervision (verbal cues/safety issues);3: Sit > Supine: Mod A (lifting assist/Pt. 50-74%/lift 2 legs);4: Bed > Chair or W/C: Min A (steadying Pt. > 75%);4: Chair or W/C > Bed: Min A (steadying Pt. > 75%)  FIM - Locomotion: Wheelchair Distance: 100 Locomotion: Wheelchair: 2: Travels 50 - 149 ft with moderate assistance (Pt: 50 - 74%) FIM - Locomotion: Ambulation Locomotion: Ambulation Assistive Devices: TEFL teacher Ambulation/Gait Assistance: 3: Mod assist Locomotion: Ambulation: 2: Travels 50 - 149 ft with moderate assistance (Pt: 50 - 74%)  Medical Problem List and Plan:  1. 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 DVT Prophylaxis/Anticoagulation: Right upper extremity DVT. Continue Coumadin therapy. Monitor platelet counts any signs of bleeding  3. pain management. Oxycodone 10 mg every 6 hours as needed  4. Mood: Amantadine 100 mg 2 times a day, Klonopin0.5 mg  -- wean off, Inderal 30 mg every 12 hours, Ativan 1 mg every 4 as needed anxiety.t  Continue to monitor and modify behavior program,   -celexa qhs Agitation at noc increased Seroquel to 50mg ---will wean this also to off.  5. Neuropsych: This patient is not capable of making decisions on his/her own behalf.  6. Multiple facial fractures. Conservative care per ENT Dr. Emeline Darling  7. L1 transverse process fracture. Conservative care,   scheduled oxycodone  8. VDRF/tracheostomy.     -trach out- site closed 9. Gastrostomy tube 07/03/2012.  .   On  D2/nectar diet --eating well  -bmet ok.  Eating quite well. Drinking adequate liquids  -discuss with RN regarding meds thru tube. Would like to go ahead and take it out tomorrow 10. Bilateral pneumothorax. Resolved and chest tubes removed  11. MRSA tracheobronchitis. -resolved 12. History diabetes mellitus. Continue Lantus insulin and Glucophage as directed. Check blood sugars routine     13. Urinary retention.Flomax 0.8 mg daily. Has been continent  14.  R PCA infarct. Doesn't appear to have gross visual field deficits.   15.    R ulnar fx -cast - further separation on follow up xray    -ortho reviewed films-ordered replacement of SAC  -pt has better awareness now to protect the hand  LOS (Days) 27 A FACE TO FACE EVALUATION WAS PERFORMED  Cassidey Barrales T 09/04/2012, 7:20 AM

## 2012-09-04 NOTE — Progress Notes (Signed)
Occupational Therapy Session Note  Patient Details  Name: Alex Foster MRN: [id number] Date of Birth: 1946/07/26  Today's Date: 09/04/2012 Time: 1300-1500 Time Calculation (min): 120 min   Skilled Therapeutic Interventions/Progress Updates:    Participated in community outing with Rec Therapy to coffee shop. Focus on w/c propelling in community on uneven surfaces with feet, transferring to different surfaces with min A, navigate w/c in community environment with min A, recalling and carrying out swallowing precautions with drinking, intellectual to emergent awareness, abstracting thinking, mental math with cost of drink and receiving change back, simple problem solving, recognizing differences in self now versus before the BI and relating it to being in the community, discussion about good life choices, short term memory with supervision.  Therapy Documentation Precautions:  Precautions Precautions: Fall Precaution Comments: L shoulder subluxation and L side hemiparesis. Peg tube , right wrist fracture: in a short arm cast  Required Braces or Orthoses: Other Brace/Splint (R arm cast) Other Brace/Splint: short arm cast R UE Restrictions Weight Bearing Restrictions: Yes RUE Weight Bearing: Non weight bearing Pain: Pain Assessment Pain Assessment: 0-10 Pain Score:  (on RT outing) Pain Type: Chronic pain Pain Location: Back Pain Orientation: Mid;Lower Pain Descriptors: Aching;Sore;Tightness Pain Onset: On-going Patients Stated Pain Goal: 2 Pain Intervention(s): Medication (See eMAR) Multiple Pain Sites: Yes  See FIM for current functional status  Therapy/Group: Individual Therapy  [name] [facility] 09/04/2012, 3:35 PM

## 2012-09-04 NOTE — Progress Notes (Signed)
Physical Therapy Note  Patient Details  Name: Alex Foster MRN: [id number] Date of Birth: [birth year] Today's Date: 09/04/2012  Time: 900-956 56 minutes  Pt c/o R UE pain, RN aware.  Gait training with B PFRW 2 x 75' with focus on safety with turning.  Pt requires min steadying assist throughout gait and min A for RW control and balance during turns.  Pt improved performance with cues to slow down and attend to obstacles in his way.  Standing NMR with focus on hip stability.  Static stance with 1 UE support, 1 LE on a step 2 x 1 minute bilaterally with supervision.  Tap ups and step ups to 4'' step with 1 UE support with min A.  Pt fatigues easily requiring frequent rest breaks.  Bathroom mobility and transfers with min A - close supervision using grab bar.  Pt continues with low frustration tolerance and impaired awareness into deficits.  Individual therapy   Ioana Louks 09/04/2012, 9:57 AM

## 2012-09-04 NOTE — Progress Notes (Signed)
ANTICOAGULATION CONSULT NOTE - Follow Up Consult  Pharmacy Consult:  Coumadin Indication:  RUE DVT  Allergies  Allergen Reactions  . Penicillins    Patient Measurements: Weight: 209 lb 7 oz (95 kg)  Vital Signs: Temp: 97.4 F (36.3 C) (12/04 0630) Temp src: Oral (12/04 0630) BP: 173/86 mmHg (12/04 0630) Pulse Rate: 58  (12/04 0630)  Labs:  Alex Foster 09/04/12 0615 09/03/12 0625 09/02/12 0610  HGB -- -- --  HCT -- -- --  PLT -- -- --  APTT -- -- --  LABPROT 21.5* 19.7* 21.3*  INR 1.95* 1.73* 1.93*  HEPARINUNFRC -- -- --  CREATININE -- -- --  CKTOTAL -- -- --  CKMB -- -- --  TROPONINI -- -- --   Assessment: 50 YOM admitted to rehab s/p TBI / multi-trauma with hemorrhagic contusion to the head.  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  He developed and upper extremity DVT and is now continued on Coumadin.  INR slowly trending up. All doses charted as given. No new drug interactions noted. No CBC today, No bleeding noted.  Goal of Therapy:  INR 2 - 3 (aim for 2 - 2.5 d/t recent TBI with [facility])   Plan:  - Coumadin 10 mg PO x 1 again tonight - F/u AM INR - Will check CBC in AM  Thanks, [employer] Patella K. Allena Katz, PharmD, BCPS.  Clinical Pharmacist Pager [phone]. 09/04/2012 9:12 AM

## 2012-09-04 NOTE — Progress Notes (Signed)
Speech Language Pathology Weekly Progress Note  Patient Details  Name: Alex Foster MRN: [id number] Date of Birth: 03/29/1946  Today's Date: 09/04/2012  Short Term Goals: Week 3: SLP Short Term Goal 1 (Week 3): Pt will demonstrate problem solving for mildly complex tasks with Mod A verbal cues.  SLP Short Term Goal 1 - Progress (Week 3): Met SLP Short Term Goal 2 (Week 3): Pt will demonstrate alternating attention between two functional tasks with Mod A verbal cues.  SLP Short Term Goal 2 - Progress (Week 3): Met SLP Short Term Goal 3 (Week 3): Pt will demonstrate recall new, daily information with supervision verbal cues.  SLP Short Term Goal 3 - Progress (Week 3): Met SLP Short Term Goal 4 (Week 3): Pt will consume Dys. 2 textures and nectar-thick liquids with supervision verbal cues for utilization of compensatory strategies.  SLP Short Term Goal 4 - Progress (Week 3): Met  New Short Term Goals:  Week 4: SLP Short Term Goal 1 (Week 4): Pt will consume Dys. 3 textures and demonstrate efficient mastication without overt s/s of aspiration with  Mod I. SLP Short Term Goal 2 (Week 4): Pt will consume thin liquids via cup without overt s/s of aspiration with Min A verbal cues.  SLP Short Term Goal 3 (Week 4): Pt will demonstrate functional problem solving for mildly complex tasks with Min verbal cues.  SLP Short Term Goal 4 (Week 4): Pt will demonstrate divided attention in a group setting with Mod A verbal cues.  SLP Short Term Goal 5 (Week 4): Pt will recall new, daily information with Mod I.   Weekly Progress Updates: Pt continues to make functional gains and has met 4 of 4 STG's this reporting period. Currently, pt is demonstrating behaviors consistent with a [place]o Level VII and is overall supervision-Min A for alternating attention, mildly complex problem solving, emergent awareness and recall of new information.  Pt is also currently consuming Dys. 3 textures with nectar-thick liquids  and is overall supervision for utilization of swallowing compensatory strategies. Pt will have MBSS tomorrow to assess readiness for possible upgrade to thin liquids. Pt would benefit from continued skilled SLP intervention to maximize cognitive recovery and swallowing function with least restrictive diet.    SLP Frequency: 1-2 X/day, 30-60 minutes Estimated Length of Stay: 1 week SLP Treatment/Interventions: Cognitive remediation/compensation;Cueing hierarchy;Environmental controls;Dysphagia/aspiration precaution training;Functional tasks;Internal/external aids;Patient/family education;Therapeutic Activities     Shaneika Rossa 09/04/2012, 3:36 PM

## 2012-09-05 ENCOUNTER — Inpatient Hospital Stay (HOSPITAL_COMMUNITY): Payer: Medicaid Other

## 2012-09-05 ENCOUNTER — Inpatient Hospital Stay (HOSPITAL_COMMUNITY): Payer: Medicaid Other | Admitting: Physical Therapy

## 2012-09-05 ENCOUNTER — Inpatient Hospital Stay (HOSPITAL_COMMUNITY): Payer: Medicaid Other | Admitting: Speech Pathology

## 2012-09-05 ENCOUNTER — Inpatient Hospital Stay (HOSPITAL_COMMUNITY): Payer: Self-pay | Admitting: Occupational Therapy

## 2012-09-05 ENCOUNTER — Encounter (HOSPITAL_COMMUNITY): Payer: Self-pay | Admitting: Occupational Therapy

## 2012-09-05 LAB — CBC
HCT: 34.5 % — ABNORMAL LOW (ref 39.0–52.0)
Hemoglobin: 11.2 g/dL — ABNORMAL LOW (ref 13.0–17.0)
MCV: 83.9 fL (ref 78.0–100.0)
Platelets: 136 10*3/uL — ABNORMAL LOW (ref 150–400)
RBC: 4.11 MIL/uL — ABNORMAL LOW (ref 4.22–5.81)
WBC: 5.4 10*3/uL (ref 4.0–10.5)

## 2012-09-05 LAB — GLUCOSE, CAPILLARY
Glucose-Capillary: 139 mg/dL — ABNORMAL HIGH (ref 70–99)
Glucose-Capillary: 151 mg/dL — ABNORMAL HIGH (ref 70–99)
Glucose-Capillary: 145 mg/dL — ABNORMAL HIGH (ref 70–99)

## 2012-09-05 MED ORDER — WARFARIN SODIUM 6 MG PO TABS
6.0000 mg | ORAL_TABLET | Freq: Once | ORAL | Status: AC
  Administered 2012-09-05: 6 mg via ORAL
  Filled 2012-09-05: qty 1

## 2012-09-05 NOTE — Progress Notes (Signed)
Physical Therapy Note  Patient Details  Name: Alex Foster MRN: [id number] Date of Birth: 1946/06/22 Today's Date: 09/05/2012  Time: 1030-1130 60 minutes (30 minutes co-tx with TR)  No c/o pain.  Gait training with B PFRW 2 x 60' with min A, pt with much controlled balance and RW control, able to negotiate obstacles with min A for balance, pt able to control PFRW.  Standing balance training with LE taps up/forward and laterally, heel raises and squats all with min A.  Standing card game for standing tolerance and balance as well as B UE control.  Pt improving with functional use of L UE.  Pt able to stand 45-60 seconds before fatigue without UE support with min steadying assist.  Overall pt with much improved balance noted today.  Individual therapy   Tynisha Ogan 09/05/2012, 11:28 AM

## 2012-09-05 NOTE — Progress Notes (Signed)
Occupational Therapy Session Note  Patient Details  Name: Alex Foster MRN: [id number] Date of Birth: 11-Aug-1946  Today's Date: 09/05/2012 Time: 0730-0805 Time Calculation (min): 35 min  Short Term Goals: Week 4:  OT Short Term Goal 1 (Week 4): Pt will don shirt wtih Mod A OT Short Term Goal 2 (Week 4): Pt will perform LB dressing wtih mod A OT Short Term Goal 3 (Week 4): Pt will perform grooming when not in pain with min A OT Short Term Goal 4 (Week 4): Pt will demonstrate overall memory with min A  Skilled Therapeutic Interventions/Progress Updates:    1:1 self care retraining at EOB. Pt had PEG just removed at bedside and we decided to keep it dry this am. Sponge bathe the hot spots with attention to position of left UE with functional use, sit to stands without UE support with min A, recall of events in therapy tomorrow, making goals to works towards today.  Therapy Documentation Precautions:  Precautions Precautions: Fall Precaution Comments: L shoulder subluxation and L side hemiparesis. Peg tube , right wrist fracture: in a short arm cast  Required Braces or Orthoses: Other Brace/Splint (R arm cast) Other Brace/Splint: short arm cast R UE Restrictions Weight Bearing Restrictions: Yes RUE Weight Bearing: Non weight bearing General: General Amount of Missed OT Time (min): 10 Minutes due to fatigue  Pain:  no c/o pain this am   See FIM for current functional status  Therapy/Group: Individual Therapy  [name] [facility] 09/05/2012, 8:16 AM

## 2012-09-05 NOTE — Progress Notes (Signed)
Occupational Therapy Session Note  Patient Details  Name: Alex Foster MRN: [id number] Date of Birth: August 13, 1946  Today's Date: 09/05/2012 Time: 0950-1030 Time Calculation (min): 40 min  Short Term Goals: Week 4:  OT Short Term Goal 1 (Week 4): Pt will don shirt wtih Mod A OT Short Term Goal 2 (Week 4): Pt will perform LB dressing wtih mod A OT Short Term Goal 3 (Week 4): Pt will perform grooming when not in pain with min A OT Short Term Goal 4 (Week 4): Pt will demonstrate overall memory with min A  Skilled Therapeutic Interventions/Progress Updates:    1:1 focus on community mobility with w/c down to errand solutions to pay for and get watch (he turned in for new batteries), activity tolerance and problem solving how to work through frustration, how to obtain assistance with different tasks in different situations.   Therapy Documentation Precautions:  Precautions Precautions: Fall Precaution Comments: L shoulder subluxation and L side hemiparesis. Peg tube , right wrist fracture: in a short arm cast  Required Braces or Orthoses: Other Brace/Splint (R arm cast) Other Brace/Splint: short arm cast R UE Restrictions Weight Bearing Restrictions: Yes (right wrist/arm) RUE Weight Bearing: Non weight bearing Pain: Pain Assessment Pain Assessment: 0-10 Pain Score:   5 Pain Type: Chronic pain Pain Location: Back Pain Orientation: Lower Pain Descriptors: Aching Pain Frequency: Occasional Pain Onset: On-going Patients Stated Pain Goal: 2 Pain Intervention(s): RN made aware Multiple Pain Sites: No  See FIM for current functional status  Therapy/Group: Individual Therapy  [name] [facility] 09/05/2012, 3:23 PM

## 2012-09-05 NOTE — Progress Notes (Addendum)
Occupational Therapy Note  Patient Details  Name: Alex Foster MRN: [id number] Date of Birth: 24-Apr-1946 Today's Date: 09/05/2012  Time: 1200-1220 (cotx with Speech therapy - actual group time 1130-1220) Pt denies pain Group therapy Pt participated in self feeding group with focus on increased use of LUE to assist with opening containers, swallowing strategies, and social engagement.  Pt using LUE to help scoop food items onto spoon.     Lavone Neri [facility] 09/05/2012, 3:53 PM

## 2012-09-05 NOTE — Progress Notes (Signed)
Physical Therapy Session Note  Patient Details  Name: Alex Foster MRN: [id number] Date of Birth: 10-19-1945  Today's Date: 09/05/2012 Time: 1300-1330 Time Calculation (min): 30 min.  Skilled Therapeutic Interventions/Progress Updates:  Patient ambulated with PFRW in controlled environment with min-mod A to maintain safe proximity within walker due to visual deficits 65' x2 with one seated rest break between bouts.  Patient ambulated with PFRW around obstacles in obstacle course with mod A for 25' x3 bouts with seated rest break between bouts.  Patient performed WC propulsion using bilateral LEs with mod A 25'.     Therapy Documentation Precautions:  Precautions Precautions: Fall Precaution Comments: L shoulder subluxation and L side hemiparesis. Peg tube , right wrist fracture: in a short arm cast  Required Braces or Orthoses: Other Brace/Splint (R arm cast) Other Brace/Splint: short arm cast R UE Restrictions Weight Bearing Restrictions: Yes (right wrist/arm) RUE Weight Bearing: Non weight bearing  See FIM for current functional status  Therapy/Group: Individual Therapy  Rexene Agent 09/05/2012, 2:31 PM

## 2012-09-05 NOTE — Progress Notes (Signed)
Speech Language Pathology Daily Session Note  Patient Details  Name: Alex Foster MRN: [id number] Date of Birth: [birth year]  Today's Date: 09/05/2012 Time: 1130-1200 Time Calculation (min): 30 min  Short Term Goals: Week 4: SLP Short Term Goal 1 (Week 4): Pt will consume Dys. 3 textures and demonstrate efficient mastication without overt s/s of aspiration with  Mod I. SLP Short Term Goal 2 (Week 4): Pt will consume trials of thin liquids via cup without overt s/s of aspiration with Min A verbal cues.  SLP Short Term Goal 3 (Week 4): Pt will demonstrate functional problem solving for mildly complex tasks with Min verbal cues.  SLP Short Term Goal 4 (Week 4): Pt will demonstrate divided attention in a group setting with Mod A verbal cues.  SLP Short Term Goal 5 (Week 4): Pt will recall new, daily information with Mod I.   Skilled Therapeutic Interventions: Pt seen in [employer] with focus on dysphagia goals. Pt consumed Dys. 3 textures with nectar-thick liquids with cough X 1 and required Min verbal cues to utilize compensatory strategies with multiple swallows and intermittent cough    FIM:  Comprehension Comprehension Mode: Auditory Comprehension: 5-Follows basic conversation/direction: With extra time/assistive device Expression Expression Mode: Verbal Expression: 5-Expresses complex 90% of the time/cues < 10% of the time Social Interaction Social Interaction: 5-Interacts appropriately 90% of the time - Needs monitoring or encouragement for participation or interaction. Problem Solving Problem Solving: 5-Solves basic 90% of the time/requires cueing < 10% of the time Memory Memory: 4-Recognizes or recalls 75 - 89% of the time/requires cueing 10 - 24% of the time FIM - Eating Eating Activity: 5: Needs verbal cues/supervision  Pain Pain Assessment Pain Assessment: 0-10 Pain Score:   5 Pain Type: Chronic pain Pain Location: Back Pain Orientation: Lower Pain Descriptors:  Aching Pain Frequency: Occasional Pain Onset: On-going Patients Stated Pain Goal: 2 Pain Intervention(s): RN made aware Multiple Pain Sites: No  Therapy/Group: Dysphagia Group  Nicholaos Schippers 09/05/2012, 4:32 PM

## 2012-09-05 NOTE — Progress Notes (Signed)
Social Work Patient ID: Alex Foster, male   DOB: 24-Jun-1946, 66 y.o.   MRN: [id number]  Have reviewed team conference with pt and brother Duffy Rhody) - both aware that I am continuing to pursue SNF placement, however, pt having only Medicaid is causing placement barrier with finding bed.  Will keep staff posted.  Kayvan Hoefling, LCSW
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
## 2012-09-05 NOTE — Progress Notes (Signed)
Recreational Therapy Session Note  Patient Details  Name: Alex Foster MRN: [id number] Date of Birth: Feb 19, 1946 Today's Date: 09/05/2012 Time:08-1129 Pain: no c/o Skilled Therapeutic Interventions/Progress Updates:  Pt stood to play card game,  5 card draw, using BUE's to manipulate cards with close supervision for balance & UE use.  Pt did not require verbal cuing for attention, memory, or problem solving. Therapy/Group: Co-Treatment  Raul Torrance 09/05/2012, 1:28 PM

## 2012-09-05 NOTE — Progress Notes (Signed)
Physical Therapy Note  Patient Details  Name: Argel Pablo MRN: [id number] Date of Birth: Aug 26, 1946 Today's Date: 09/05/2012  Time: 1430-1455 25 minutes  No c/o pain.  Seated therex performed B for LE strength and endurance.  Pt able to perform 2 x 20 all exercises.  Individual therapy   Gen Clagg 09/05/2012, 2:55 PM
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
## 2012-09-06 ENCOUNTER — Encounter (HOSPITAL_COMMUNITY): Payer: Self-pay | Admitting: Occupational Therapy

## 2012-09-06 ENCOUNTER — Inpatient Hospital Stay (HOSPITAL_COMMUNITY): Payer: Medicaid Other | Admitting: Physical Therapy

## 2012-09-06 ENCOUNTER — Inpatient Hospital Stay (HOSPITAL_COMMUNITY): Payer: Medicaid Other | Admitting: Speech Pathology

## 2012-09-06 ENCOUNTER — Inpatient Hospital Stay (HOSPITAL_COMMUNITY): Payer: Self-pay | Admitting: Occupational Therapy

## 2012-09-06 LAB — PROTIME-INR: Prothrombin Time: 28 seconds — ABNORMAL HIGH (ref 11.6–15.2)

## 2012-09-06 LAB — GLUCOSE, CAPILLARY: Glucose-Capillary: 158 mg/dL — ABNORMAL HIGH (ref 70–99)

## 2012-09-06 MED ORDER — WARFARIN SODIUM 6 MG PO TABS
7.0000 mg | ORAL_TABLET | Freq: Every day | ORAL | Status: DC
  Administered 2012-09-06: 7 mg via ORAL
  Filled 2012-09-06 (×2): qty 1
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
## 2012-09-06 NOTE — Progress Notes (Signed)
Physical Therapy Note  Patient Details  Name: Alex Foster MRN: [id number] Date of Birth: 08/11/1946 Today's Date: 09/06/2012  Time: 1000-1055 55 minutes  No c/o pain.  Gait training with B PFRW with min A 3 x 100'.  Pt continues to require increased time to problem solve obstacle negotiation, improving with maneuvering RW and improved balance reactions noted.  Stair training with 1 handrail with min A, cues for safety and slowing down.  Requires assist for balance when descending stairs.  Nu step for LE/UE endurance 2 x 5 minutes, level 4, >60 steps per minute throughout.  Transfer and balance training working to decrease assist needed for transfers.  Pt requires cuing for safe technique and requires min A for balance when fatigued.  Individual therapy   Meylin Stenzel 09/06/2012, 10:57 AM

## 2012-09-06 NOTE — Progress Notes (Signed)
Physical Therapy Session Note  Patient Details  Name: Alex Foster MRN: [id number] Date of Birth: [birth year]  Today's Date: 09/06/2012 Time: 1300-1330 Time Calculation (min): 30 min  Skilled Therapeutic Interventions/Progress Updates:  Patient ambulated in controlled environment with PFRW and min A 86' x2 with one minor LOB and seated rest break between bouts.  Stair training on 10 steps with 2 handrails and close supervision x 2 bouts without loss of balance.  Seated bilateral LE strengthening exercises to improve functional mobility 2x20 reps:  LAQ, marching in place, hip adduction (pillow squeeze).  Patient performed WC propulsion to return to room >150' with min A for increased endurance.     Therapy Documentation Precautions:  Precautions Precautions: Fall Precaution Comments: L shoulder subluxation and L side hemiparesis. Peg tube , right wrist fracture: in a short arm cast  Required Braces or Orthoses: Other Brace/Splint (R arm cast) Other Brace/Splint: short arm cast R UE Restrictions Weight Bearing Restrictions: Yes (right wrist/arm) RUE Weight Bearing: Non weight bearing   Pain: Pain Assessment Pain Assessment: No/denies pain Mobility:   Locomotion : Ambulation Ambulation/Gait Assistance: 4: Min assist     See FIM for current functional status  Therapy/Group: Individual Therapy  Rexene Agent 09/06/2012, 1:42 PM

## 2012-09-06 NOTE — Progress Notes (Signed)
Speech Language Pathology Daily Session Notes  Patient Details  Name: Alex Foster MRN: [id number] Date of Birth: Jun 29, 1946  Today's Date: 09/06/2012  Session 1 Time: 1100-1130 Time Calculation (min): 30 min  Session 2 Time: 1130-1145 Time Calculation: 15 min  Session 3 Time: 1430-1445 Time Calculation: 15 min  Short Term Goals: Week 4: SLP Short Term Goal 1 (Week 4): Pt will consume Dys. 3 textures and demonstrate efficient mastication without overt s/s of aspiration with  Mod I. SLP Short Term Goal 2 (Week 4): Pt will consume trials of thin liquids via cup without overt s/s of aspiration with Min A verbal cues.  SLP Short Term Goal 3 (Week 4): Pt will demonstrate functional problem solving for mildly complex tasks with Min verbal cues.  SLP Short Term Goal 4 (Week 4): Pt will demonstrate divided attention in a group setting with Mod A verbal cues.  SLP Short Term Goal 5 (Week 4): Pt will recall new, daily information with Mod I.   Skilled Therapeutic Interventions:  Session 1: Treatment focus on cognitive goals. Pt participated in 4 and 6 step sequencing task with focus on problem solving, organization and left attention. Pt completed task with supervision question cues.    Session 2: Pt seen in [place] with focus on dysphagia goals. Pt required Min verbal cues to utilize swallowing compensatory strategies and demonstrated intermittent wet vocal quality X 2. Pt required Mod question cues to self-monitor and correct.   Session 3: Pt missed last 15 minutes of session due to pain in lower back, pt transferred back to bed. Treatment focus on anticipatory awareness for discharge planning. Pt demonstrates appropriate insight/awarness into deficits but requires Mod verbal and question cues to self-monitor and correct frustration tolerance with functional tasks and conversation.   FIM:  Comprehension Comprehension Mode: Auditory Comprehension: 5-Follows basic conversation/direction:  With extra time/assistive device Expression Expression Mode: Verbal Expression: 5-Expresses complex 90% of the time/cues < 10% of the time Social Interaction Social Interaction: 5-Interacts appropriately 90% of the time - Needs monitoring or encouragement for participation or interaction. Problem Solving Problem Solving: 5-Solves basic 90% of the time/requires cueing < 10% of the time Memory Memory: 4-Recognizes or recalls 75 - 89% of the time/requires cueing 10 - 24% of the time FIM - Eating Eating Activity: 5: Needs verbal cues/supervision  Pain Pt reported pain in back in last session, pt premedicated and repositioned (transferred back to bed)  Therapy/Group: Individual Therapy and Dysphagia Group  Saron Vanorman 09/06/2012, 3:38 PM
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
## 2012-09-06 NOTE — Progress Notes (Signed)
Physical Therapy Weekly Progress Note  Patient Details  Name: Alex Foster MRN: [id number] Date of Birth: 1946/05/19  Today's Date: 09/06/2012  Patient is currently min A with mobility, transfers and gait with PFRW.  He continues to make progress and long term goals have been upgraded to supervision level for transfers and bed mobility.  Pt continues with low frustration tolerance which limits functional progress, decreased strength B hips and core, decreased L UE/LE strength.  Patient continues to demonstrate the following deficits: impaired awareness, decreased activity tolerance, impaired gait and balance and therefore will continue to benefit from skilled PT intervention to enhance overall performance with activity tolerance, balance, postural control, ability to compensate for deficits, functional use of  left upper extremity and left lower extremity and awareness.  See Patient's Care Plan for progression toward long term goals.  Patient progressing toward long term goals..  Continue plan of care.  Skilled Therapeutic Interventions/Progress Updates:  Ambulation/gait training;Community reintegration;DME/adaptive equipment instruction;Neuromuscular re-education;Therapeutic Exercise;UE/LE Strength taining/ROM;Splinting/orthotics;Pain management;Stair training;UE/LE Coordination activities;Wheelchair propulsion/positioning;Therapeutic Activities;Functional mobility training;Patient/family education;Cognitive remediation/compensation;Balance/vestibular training;Discharge planning   See FIM for current functional status    DONAWERTH,KAREN 09/06/2012, 7:45 AM

## 2012-09-06 NOTE — Progress Notes (Signed)
Occupational Therapy Note  Patient Details  Name: Alex Foster MRN: [id number] Date of Birth: [birth year] Today's Date: 09/06/2012  Time: 5284-1324 (cotx with Speech Therapy - group time 1130-1155) Pt denies pain Individual therapy Pt participated in self feeding group with focus on swallowing strategies, portion control, and using LUE to assist with feeding. Pt very reluctant in attempting to use LUE for assist with self feeding.  Pt did agree to use LUE as gross assist with opening containers.   Lavone Neri [facility] 09/06/2012, 2:53 PM

## 2012-09-06 NOTE — Progress Notes (Signed)
Occupational Therapy Session Note  Patient Details  Name: Alex Foster MRN: [id number] Date of Birth: Jun 04, 1946  Today's Date: 09/06/2012 Time: 0830-0910 Time Calculation (min): 40 min  Skilled Therapeutic Interventions/Progress Updates:    1:1 self care retraining at shower level with focus on decrease amount of cuing with functional use of left UE, sit to stand and stand to sit with control, standing balance with and without UE support, dressing using hemi techniques (pt able to thread bilateral LEs when dress left side first and with extra time, able to don and fasten shoes with setup and extra time. Pt demonstrated better frustration tolerance  1:1 2nd session: 13:45-14:30 focus on neuro muscular reeducation with left UE with focus on normal patterns of movement, trunk posture with UE movement, shoulder/ scapular movement in gravity eliminated planes (UE supported on therapy ball and supported by therapist. Noted pt with decreased subluxation depth in shoulder. Functional ambulation with bilateral platform RW with min A (especially with turning). Practiced donning sock with one hand - "catching toes"- able to perform with min A  Therapy Documentation Precautions:  Precautions Precautions: Fall Precaution Comments: L shoulder subluxation and L side hemiparesis. Peg tube , right wrist fracture: in a short arm cast  Required Braces or Orthoses: Other Brace/Splint (R arm cast) Other Brace/Splint: short arm cast R UE Restrictions Weight Bearing Restrictions: Yes (right wrist/arm) RUE Weight Bearing: Non weight bearing Pain: Pain Assessment Pain Assessment: No/denies pain  See FIM for current functional status  Therapy/Group: Individual Therapy  [name] [facility] 09/06/2012, 3:09 PM

## 2012-09-07 ENCOUNTER — Inpatient Hospital Stay (HOSPITAL_COMMUNITY): Payer: Medicaid Other | Admitting: Physical Therapy

## 2012-09-07 ENCOUNTER — Inpatient Hospital Stay (HOSPITAL_COMMUNITY): Payer: Medicaid Other | Admitting: Speech Pathology

## 2012-09-07 LAB — GLUCOSE, CAPILLARY
Glucose-Capillary: 126 mg/dL — ABNORMAL HIGH (ref 70–99)
Glucose-Capillary: 143 mg/dL — ABNORMAL HIGH (ref 70–99)

## 2012-09-07 LAB — PROTIME-INR: INR: 3.02 — ABNORMAL HIGH (ref 0.00–1.49)

## 2012-09-07 MED ORDER — WARFARIN SODIUM 1 MG PO TABS
1.0000 mg | ORAL_TABLET | Freq: Once | ORAL | Status: AC
  Administered 2012-09-07: 1 mg via ORAL
  Filled 2012-09-07: qty 1

## 2012-09-07 NOTE — Progress Notes (Signed)
Speech Language Pathology Daily Session Note  Patient Details  Name: Alex Foster MRN: [id number] Date of Birth: 1946-08-07  Today's Date: 09/07/2012 Time: 1130-1200 Time Calculation (min): 30 min  Short Term Goals: Week 4: SLP Short Term Goal 1 (Week 4): Pt will consume Dys. 3 textures and demonstrate efficient mastication without overt s/s of aspiration with  Mod I. SLP Short Term Goal 2 (Week 4): Pt will consume trials of thin liquids via cup without overt s/s of aspiration with Min A verbal cues.  SLP Short Term Goal 3 (Week 4): Pt will demonstrate functional problem solving for mildly complex tasks with Min verbal cues.  SLP Short Term Goal 4 (Week 4): Pt will demonstrate divided attention in a group setting with Mod A verbal cues.  SLP Short Term Goal 5 (Week 4): Pt will recall new, daily information with Mod I.   Skilled Therapeutic Interventions: Pt seen in [employer] with focus on dysphagia goals. Pt required Min verbal cues to utilize swallowing compensatory strategies and demonstrated intermittent wet vocal quality X 2. Pt required Mod question cues to self-monitor and correct.    FIM:  Comprehension Comprehension Mode: Auditory Comprehension: 5-Follows basic conversation/direction: With extra time/assistive device Expression Expression Mode: Verbal Expression: 5-Expresses complex 90% of the time/cues < 10% of the time Social Interaction Social Interaction: 5-Interacts appropriately 90% of the time - Needs monitoring or encouragement for participation or interaction. Problem Solving Problem Solving: 5-Solves basic 90% of the time/requires cueing < 10% of the time Memory Memory: 5-Recognizes or recalls 90% of the time/requires cueing < 10% of the time FIM - Eating Eating Activity: 5: Needs verbal cues/supervision  Pain Pain Assessment Pain Assessment: No/denies pain  Therapy/Group: Dysphagia Group  Zadkiel Dragan 09/07/2012, 1:55 PM

## 2012-09-07 NOTE — Progress Notes (Signed)
Occupational Therapy Session Note  Patient Details  Name: Victoriano Campion MRN: [id number] Date of Birth: [birth year]  Today's Date: 09/07/2012 Time: 1200-1220 Time Calculation (min): 20 min     Skilled Therapeutic Interventions/Progress Updates: [employer] with cues for attention and bilateral hand use as able     Therapy Documentation Precautions:    Other Treatments:    See FIM for current functional status  Therapy/Group: Group Therapy  Rozelle Logan 09/07/2012, 11:45 PM

## 2012-09-07 NOTE — Progress Notes (Signed)
ANTICOAGULATION CONSULT NOTE - Follow Up Consult  Pharmacy Consult:  Coumadin Indication:  RUE DVT  Allergies  Allergen Reactions  . 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  He developed and upper extremity DVT and is now continued on Coumadin.  INR slightly above goal, no bleeding reported.  Noted his platelets has trended down and will monitor.   Goal of Therapy:  INR 2 - 3 (aim for 2 - 2.5 d/t recent TBI with SAH)    Plan:  - Coumadin 1mg  PO today - Daily PT / INR  - Weekly CBC     Lang Zingg D. Laney Potash, PharmD, BCPS Pager:  [phone] 09/07/2012, 10:32 AM

## 2012-09-07 NOTE — Progress Notes (Signed)
Physical Therapy Note  Patient Details  Name: Alex Foster MRN: [id number] Date of Birth: September 11, 1946 Today's Date: 09/07/2012  1030-1100 (30 minutes) individual Pain: no reported pain Focus of treatment: gait training Treatment: gait 30 feet X 3 mod assist for balance without UE assist . Pt internally rotates Lt LE during swing to stance with increased lean to right or left. Gait with bilateral PFRW min /mod assist with gait as above and assist to avoid objects.     Trenita Hulme,[name] 09/07/2012, 7:36 AM
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
## 2012-09-08 LAB — GLUCOSE, CAPILLARY
Glucose-Capillary: 110 mg/dL — ABNORMAL HIGH (ref 70–99)
Glucose-Capillary: 107 mg/dL — ABNORMAL HIGH (ref 70–99)

## 2012-09-08 LAB — PROTIME-INR: INR: 2.69 — ABNORMAL HIGH (ref 0.00–1.49)

## 2012-09-08 MED ORDER — WARFARIN SODIUM 7.5 MG PO TABS
7.5000 mg | ORAL_TABLET | ORAL | Status: DC
  Administered 2012-09-08 – 2012-09-10 (×2): 7.5 mg via ORAL
  Filled 2012-09-08 (×2): qty 1

## 2012-09-08 MED ORDER — WARFARIN SODIUM 5 MG PO TABS
5.0000 mg | ORAL_TABLET | ORAL | Status: DC
  Administered 2012-09-09: 5 mg via ORAL
  Filled 2012-09-08 (×2): qty 1

## 2012-09-08 NOTE — Plan of Care (Signed)
Problem: RH PAIN MANAGEMENT Goal: RH STG PAIN MANAGED AT OR BELOW PT'S PAIN GOAL 3 or less 1-10 scale  Outcome: Progressing Oxy IR 10mg  effective, uses 2-3 doses/day on average
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
## 2012-09-08 NOTE — Progress Notes (Signed)
Patient ID: Alex Foster, male   DOB: 1946-02-28, 66 y.o.   MRN: [id number] Patient ID: Alex Foster, male   DOB: Dec 26, 1945, 66 y.o.   MRN: [id number] Patient ID: Alex Foster, male   DOB: 09/08/1946, 66 y.o.   MRN: [id number]  Subjective/Complaints: 12/8. Feels well without concerns or complaints. No further headaches A 12 point review of systems has been performed and if not noted above is otherwise negative. ENT unremarkable. Chest clear. Cardiovascular normal heart sounds no murmur. Abdomen benign. Prior PEG site clean and healing ; Extremities no edema right lower arm casted  Lab Results  Component Value Date   INR 3.02* 09/07/2012   INR 2.79* 09/06/2012   INR 2.56* 09/05/2012    CBG (last 3)   Basename 09/08/12 0750 09/07/12 2122 09/07/12 1644  GLUCAP 110* 143* 112*   BP Readings from Last 3 Encounters:  09/08/12 120/71  08/08/12 142/75  08/08/12 142/75    Objective: Vital Signs: Blood pressure 120/71, pulse 56, temperature 98.8 F (37.1 C), temperature source Oral, resp. rate 18, weight 95.7 kg (210 lb 15.7 oz), SpO2 94.00%. No results found. Results for orders placed during the hospital encounter of 08/08/12 (from the past 72 hour(s))  GLUCOSE, CAPILLARY     Status: Abnormal   Collection Time   09/05/12 11:37 AM      Component Value Range Comment   Glucose-Capillary 151 (*) 70 - 99 mg/dL    Comment 1 Notify RN     GLUCOSE, CAPILLARY     Status: Abnormal   Collection Time   09/05/12  5:40 PM      Component Value Range Comment   Glucose-Capillary 148 (*) 70 - 99 mg/dL    Comment 1 Notify RN     GLUCOSE, CAPILLARY     Status: Abnormal   Collection Time   09/05/12  9:00 PM      Component Value Range Comment   Glucose-Capillary 145 (*) 70 - 99 mg/dL    Comment 1 Notify RN     PROTIME-INR     Status: Abnormal   Collection Time   09/06/12  6:56 AM      Component Value Range Comment   Prothrombin Time 28.0 (*) 11.6 - 15.2 seconds    INR 2.79 (*) 0.00 - 1.49    GLUCOSE, CAPILLARY     Status: Abnormal   Collection Time   09/06/12  7:52 AM      Component Value Range Comment   Glucose-Capillary 148 (*) 70 - 99 mg/dL    Comment 1 Notify RN     GLUCOSE, CAPILLARY     Status: Abnormal   Collection Time   09/06/12 11:28 AM      Component Value Range Comment   Glucose-Capillary 158 (*) 70 - 99 mg/dL    Comment 1 Notify RN     GLUCOSE, CAPILLARY     Status: Abnormal   Collection Time   09/06/12  4:26 PM      Component Value Range Comment   Glucose-Capillary 134 (*) 70 - 99 mg/dL    Comment 1 Notify RN     GLUCOSE, CAPILLARY     Status: Abnormal   Collection Time   09/06/12  9:16 PM      Component Value Range Comment   Glucose-Capillary 154 (*) 70 - 99 mg/dL   PROTIME-INR     Status: Abnormal   Collection Time   09/07/12  6:30 AM      Component Value Range  Comment   Prothrombin Time 29.7 (*) 11.6 - 15.2 seconds    INR 3.02 (*) 0.00 - 1.49   GLUCOSE, CAPILLARY     Status: Abnormal   Collection Time   09/07/12  7:32 AM      Component Value Range Comment   Glucose-Capillary 126 (*) 70 - 99 mg/dL    Comment 1 Notify RN     GLUCOSE, CAPILLARY     Status: Abnormal   Collection Time   09/07/12 11:43 AM      Component Value Range Comment   Glucose-Capillary 182 (*) 70 - 99 mg/dL    Comment 1 Notify RN     GLUCOSE, CAPILLARY     Status: Abnormal   Collection Time   09/07/12  4:44 PM      Component Value Range Comment   Glucose-Capillary 112 (*) 70 - 99 mg/dL    Comment 1 Notify RN     GLUCOSE, CAPILLARY     Status: Abnormal   Collection Time   09/07/12  9:22 PM      Component Value Range Comment   Glucose-Capillary 143 (*) 70 - 99 mg/dL    Comment 1 Notify RN     GLUCOSE, CAPILLARY     Status: Abnormal   Collection Time   09/08/12  7:50 AM      Component Value Range Comment   Glucose-Capillary 110 (*) 70 - 99 mg/dL     HENT:  Patient with multiple healing abrasions.  Neck:  Neck clean. No drainage from former trach site. Cardiovascular:  Normal rate and regular rhythm.  Pulmonary/Chest:  occ rhonchi, no distress Abdominal: Soft. He exhibits no distension.  Former tube site already granulating in with minimal drainage. Neurological: Reflex Scores:  Tricep reflexes are 2+ on the right side and 3+ on the left side.  Bicep reflexes are 2+ on the right side and 3+ on the left side.  Brachioradialis reflexes are 2+ on the right side and 3+ on the left side.  Patellar reflexes are 2+ on the right side and 3+ on the left side.  Achilles reflexes are 2+ on the right side and 3+ on the left side.  very alert, answers simple questions appropriately. Calls to use the bathroom. Insight and awareness have improved dramatically Has no gross visual field deficits. 4 /5 strength in the left deltoid bicep tricep and grip 4/5 in the right deltoid bicep tricep and grip   4 in the left hip extensor knee extensor synergistic pattern and 4/5 in the right lower extremity     Assessment/Plan: 1. Functional deficits secondary to Severe TBI multitrauma which require 3+ hours per day of interdisciplinary therapy in a comprehensive inpatient rehab setting. Physiatrist is providing close team supervision and 24 hour management of active medical problems listed below. Physiatrist and rehab team continue to assess barriers to discharge/monitor patient progress toward functional and medical goals.  Placement pending  FIM: FIM - Bathing Bathing Steps Patient Completed: Chest;Right Arm;Left Arm;Abdomen;Front perineal area;Right upper leg;Left upper leg Bathing: 3: Mod-Patient completes 5-7 33f 10 parts or 50-74%  FIM - Upper Body Dressing/Undressing Upper body dressing/undressing steps patient completed: Thread/unthread right sleeve of pullover shirt/dresss;Thread/unthread left sleeve of pullover shirt/dress;Put head through opening of pull over shirt/dress Upper body dressing/undressing: 4: Min-Patient completed 75 plus % of tasks FIM - Lower Body  Dressing/Undressing Lower body dressing/undressing steps patient completed: Thread/unthread right underwear leg;Thread/unthread left underwear leg;Thread/unthread right pants leg;Thread/unthread left pants leg;Don/Doff right shoe;Don/Doff left shoe;Fasten/unfasten  right shoe;Fasten/unfasten left shoe;Don/Doff right sock;Don/Doff left sock Lower body dressing/undressing: 4: Min-Patient completed 75 plus % of tasks  FIM - Toileting Toileting steps completed by patient: Adjust clothing prior to toileting Toileting Assistive Devices: Grab bar or rail for support Toileting: 2: Max-Patient completed 1 of 3 steps  FIM - Diplomatic Services operational officer Devices: Grab bars Toilet Transfers: 4-To toilet/BSC: Min A (steadying Pt. > 75%);4-From toilet/BSC: Min A (steadying Pt. > 75%)  FIM - Bed/Chair Transfer Bed/Chair Transfer Assistive Devices: Bed rails Bed/Chair Transfer: 4: Bed > Chair or W/C: Min A (steadying Pt. > 75%);4: Chair or W/C > Bed: Min A (steadying Pt. > 75%)  FIM - Locomotion: Wheelchair Distance: 100 Locomotion: Wheelchair: 2: Travels 50 - 149 ft with minimal assistance (Pt.>75%) FIM - Locomotion: Ambulation Locomotion: Ambulation Assistive Devices: TEFL teacher Ambulation/Gait Assistance: 4: Min assist Locomotion: Ambulation: 2: Travels 50 - 149 ft with minimal assistance (Pt.>75%)  Medical Problem List and Plan:  1. 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 DVT Prophylaxis/Anticoagulation: Right upper extremity DVT. Continue Coumadin therapy. Monitor platelet counts any signs of bleeding  3. pain management. Oxycodone 10 mg every 6 hours as needed  4. Mood: Amantadine 100 mg 2 times a day, Klonopin0.5 mg  -- wean off, Inderal 30 mg every 12 hours, Ativan 1 mg every 4 as needed anxiety.t  Continue to monitor and modify behavior program,   -celexa qhs Agitation at noc increased Seroquel to 50mg ---will wean this also to off.  5.  Neuropsych: This patient is not capable of making decisions on his/her own behalf.  6. Multiple facial fractures. Conservative care per ENT Dr. Emeline Darling  7. L1 transverse process fracture. Conservative care,   scheduled oxycodone  8. VDRF/tracheostomy.     -trach out- site closed 9. Gastrostomy tube 07/03/2012.  Marland Kitchen   On  D3/nectar. Managing well. slp follow up  -removed  G-tube, no issues 11. MRSA tracheobronchitis. -resolved 12. History diabetes mellitus. Continue Lantus insulin and Glucophage as directed. Check blood sugars routine     13. Urinary retention.Flomax 0.8 mg daily. Has been continent  14.  R PCA infarct. Doesn't appear to have gross visual field deficits.   15.    R ulnar fx -cast - consider follow up films, check with ortho  -pt has better awareness now to protect the hand  LOS (Days) 31 A FACE TO FACE EVALUATION WAS PERFORMED  Rogelia Boga 09/08/2012, 8:09 AM

## 2012-09-09 ENCOUNTER — Inpatient Hospital Stay (HOSPITAL_COMMUNITY): Payer: Medicaid Other | Admitting: Occupational Therapy

## 2012-09-09 ENCOUNTER — Inpatient Hospital Stay (HOSPITAL_COMMUNITY): Payer: Self-pay | Admitting: Physical Therapy

## 2012-09-09 DIAGNOSIS — S069X9A Unspecified intracranial injury with loss of consciousness of unspecified duration, initial encounter: Secondary | ICD-10-CM

## 2012-09-09 LAB — GLUCOSE, CAPILLARY
Glucose-Capillary: 125 mg/dL — ABNORMAL HIGH (ref 70–99)
Glucose-Capillary: 127 mg/dL — ABNORMAL HIGH (ref 70–99)

## 2012-09-09 LAB — CBC
MCH: 27.4 pg (ref 26.0–34.0)
MCV: 84.2 fL (ref 78.0–100.0)
Platelets: 136 10*3/uL — ABNORMAL LOW (ref 150–400)
RBC: 4.19 MIL/uL — ABNORMAL LOW (ref 4.22–5.81)

## 2012-09-09 NOTE — Progress Notes (Signed)
Occupational Therapy Session Note  Patient Details  Name: Zebulen Simonis MRN: [id number] Date of Birth: 12-28-1945  Today's Date: 09/09/2012 Time: 1200-1210 Time Calculation (min): 10 min  Skilled Therapeutic Interventions/Progress Updates:    Pt seen in [employer] today with focus on attention and bilateral hand use during self feeding tasks.   Therapy Documentation Precautions:  Precautions Precautions: Fall Precaution Comments: L shoulder subluxation and L side hemiparesis. Peg tube , right wrist fracture: in a short arm cast  Required Braces or Orthoses: Other Brace/Splint (R arm cast) Other Brace/Splint: short arm cast R UE Restrictions Weight Bearing Restrictions: Yes RUE Weight Bearing: Non weight bearing     Pain: No c/o pain         See FIM for current functional status  Therapy/Group: Group Therapy  Charletta Cousin, Amy Beth Dixon 09/09/2012, 12:26 PM

## 2012-09-09 NOTE — Progress Notes (Signed)
Nutrition Follow-up  Intervention:   Monitor diet advancement Continue double portion entrees.  Assessment:   Patient has been tolerating diet very well; Continues to eat 100% of meals.  Protein and calorie needs remain very high.  G tube to remain in place until patient is tolerating thin liquids.  Diet Order:  Dysphagia 3 Nectar thick   Free Water:  100 ml daily per G tube  Meds: Scheduled Meds:    . amantadine  100 mg Oral Daily  . antiseptic oral rinse  15 mL Mouth Rinse QID  . citalopram  10 mg Oral Daily  . free water  100 mL Per Tube Daily  . insulin aspart  0-5 Units Subcutaneous QHS  . insulin aspart  0-9 Units Subcutaneous TID WC  . metFORMIN  500 mg Oral BID WC  . neomycin-polymyxin-pramoxine   Topical BID  . pantoprazole  40 mg Oral Daily  . propranolol  30 mg Oral BID  . QUEtiapine  25 mg Oral QHS  . senna-docusate  2 tablet Oral BID  . Tamsulosin HCl  0.8 mg Oral QHS  . warfarin  5 mg Oral Q48H  . warfarin  7.5 mg Oral Q48H  . Warfarin - Pharmacist Dosing Inpatient   Does not apply q1800   Continuous Infusions:  PRN Meds:.acetaminophen (TYLENOL) oral liquid 160 mg/5 mL, acetaminophen, albuterol, bisacodyl, guaiFENesin, guaifenesin, ipratropium, LORazepam, methocarbamol, ondansetron (ZOFRAN) IV, ondansetron, oxyCODONE, senna-docusate, sorbitol, traZODone   CMP     Component Value Date/Time   NA 137 08/26/2012 0759   K 4.4 08/26/2012 0759   CL 97 08/26/2012 0759   CO2 30 08/26/2012 0759   GLUCOSE 127* 08/26/2012 0759   BUN 19 08/26/2012 0759   CREATININE 1.01 08/26/2012 0759   CREATININE 1.15 03/13/2012 1043   CALCIUM 9.4 08/26/2012 0759   PROT 6.8 08/09/2012 0505   ALBUMIN 2.7* 08/09/2012 0505   AST 18 08/09/2012 0505   ALT 10 08/09/2012 0505   ALKPHOS 151* 08/09/2012 0505   BILITOT 0.5 08/09/2012 0505   GFRNONAA 77* 08/26/2012 0759   GFRAA 89* 08/26/2012 0759    CBG (last 3)   Basename 09/09/12 1706 09/09/12 1129 09/09/12 0735  GLUCAP 127* 125* 118*      Intake/Output Summary (Last 24 hours) at 09/09/12 1815 Last data filed at 09/09/12 1607  Gross per 24 hour  Intake   1200 ml  Output    950 ml  Net    250 ml    Weight Status:  95.7 kg (12/5) stable; 93.9 kg (11/15)   Estimated needs:  2340-2630 kcal, 130-150 gm protein  Nutrition Dx:  Increased nutrient needs related to brain injury and size AEB estimated needs, ongoing.  Goal:  Tolerate diet with intake of meals and supplements to meet 100% estimated needs, met.  Monitor:  Oral intake of meals and supplements, labs, weight   Oran Rein, RD, LDN Clinical Inpatient Dietitian Pager:  [phone] Weekend and after hours pager:  [phone]

## 2012-09-09 NOTE — Progress Notes (Signed)
Patient ID: Alex Foster, male   DOB: January 07, 1946, 66 y.o.   MRN: [id number]  Subjective/Complaints: No major problems over the weekend. Occasional pain A 12 point review of systems has been performed and if not noted above is otherwise negative.   Objective: Vital Signs: Blood pressure 116/54, pulse 53, temperature 98.2 F (36.8 C), temperature source Oral, resp. rate 18, weight 95.7 kg (210 lb 15.7 oz), SpO2 94.00%. No results found. Results for orders placed during the hospital encounter of 08/08/12 (from the past 72 hour(s))  PROTIME-INR     Status: Abnormal   Collection Time   09/06/12  6:56 AM      Component Value Range Comment   Prothrombin Time 28.0 (*) 11.6 - 15.2 seconds    INR 2.79 (*) 0.00 - 1.49   GLUCOSE, CAPILLARY     Status: Abnormal   Collection Time   09/06/12  7:52 AM      Component Value Range Comment   Glucose-Capillary 148 (*) 70 - 99 mg/dL    Comment 1 Notify RN     GLUCOSE, CAPILLARY     Status: Abnormal   Collection Time   09/06/12 11:28 AM      Component Value Range Comment   Glucose-Capillary 158 (*) 70 - 99 mg/dL    Comment 1 Notify RN     GLUCOSE, CAPILLARY     Status: Abnormal   Collection Time   09/06/12  4:26 PM      Component Value Range Comment   Glucose-Capillary 134 (*) 70 - 99 mg/dL    Comment 1 Notify RN     GLUCOSE, CAPILLARY     Status: Abnormal   Collection Time   09/06/12  9:16 PM      Component Value Range Comment   Glucose-Capillary 154 (*) 70 - 99 mg/dL   PROTIME-INR     Status: Abnormal   Collection Time   09/07/12  6:30 AM      Component Value Range Comment   Prothrombin Time 29.7 (*) 11.6 - 15.2 seconds    INR 3.02 (*) 0.00 - 1.49   GLUCOSE, CAPILLARY     Status: Abnormal   Collection Time   09/07/12  7:32 AM      Component Value Range Comment   Glucose-Capillary 126 (*) 70 - 99 mg/dL    Comment 1 Notify RN     GLUCOSE, CAPILLARY     Status: Abnormal   Collection Time   09/07/12 11:43 AM      Component Value Range Comment    Glucose-Capillary 182 (*) 70 - 99 mg/dL    Comment 1 Notify RN     GLUCOSE, CAPILLARY     Status: Abnormal   Collection Time   09/07/12  4:44 PM      Component Value Range Comment   Glucose-Capillary 112 (*) 70 - 99 mg/dL    Comment 1 Notify RN     GLUCOSE, CAPILLARY     Status: Abnormal   Collection Time   09/07/12  9:22 PM      Component Value Range Comment   Glucose-Capillary 143 (*) 70 - 99 mg/dL    Comment 1 Notify RN     PROTIME-INR     Status: Abnormal   Collection Time   09/08/12  7:00 AM      Component Value Range Comment   Prothrombin Time 27.3 (*) 11.6 - 15.2 seconds    INR 2.69 (*) 0.00 - 1.49   GLUCOSE, CAPILLARY  Status: Abnormal   Collection Time   09/08/12  7:50 AM      Component Value Range Comment   Glucose-Capillary 110 (*) 70 - 99 mg/dL   GLUCOSE, CAPILLARY     Status: Abnormal   Collection Time   09/08/12 11:53 AM      Component Value Range Comment   Glucose-Capillary 107 (*) 70 - 99 mg/dL    Comment 1 Notify RN     GLUCOSE, CAPILLARY     Status: Abnormal   Collection Time   09/08/12  4:56 PM      Component Value Range Comment   Glucose-Capillary 131 (*) 70 - 99 mg/dL    Comment 1 Notify RN     GLUCOSE, CAPILLARY     Status: Abnormal   Collection Time   09/08/12  8:51 PM      Component Value Range Comment   Glucose-Capillary 135 (*) 70 - 99 mg/dL    Comment 1 Notify RN       HENT:  Patient with multiple healing abrasions.  Neck:  Neck clean. No drainage from former trach site. Cardiovascular: Normal rate and regular rhythm.  Pulmonary/Chest:  occ rhonchi, no distress Abdominal: Soft. He exhibits no distension.  Former tube site already granulating in with minimal drainage. Neurological: Reflex Scores:  Tricep reflexes are 2+ on the right side and 3+ on the left side.  Bicep reflexes are 2+ on the right side and 3+ on the left side.  Brachioradialis reflexes are 2+ on the right side and 3+ on the left side.  Patellar reflexes are 2+ on the  right side and 3+ on the left side.  Achilles reflexes are 2+ on the right side and 3+ on the left side.  very alert, answers simple questions appropriately. Calls to use the bathroom. Insight and awareness have improved dramatically Has no gross visual field deficits--visual acuity is impaired 4 /5 strength in the left deltoid bicep tricep and grip 4/5 in the right deltoid bicep tricep and grip   4 in the left hip extensor knee extensor synergistic pattern and 4/5 in the right lower extremity     Assessment/Plan: 1. Functional deficits secondary to Severe TBI multitrauma which require 3+ hours per day of interdisciplinary therapy in a comprehensive inpatient rehab setting. Physiatrist is providing close team supervision and 24 hour management of active medical problems listed below. Physiatrist and rehab team continue to assess barriers to discharge/monitor patient progress toward functional and medical goals.  Placement pending  FIM: FIM - Bathing Bathing Steps Patient Completed: Chest;Right Arm;Left Arm;Abdomen;Front perineal area;Right upper leg;Left upper leg Bathing: 3: Mod-Patient completes 5-7 69f 10 parts or 50-74%  FIM - Upper Body Dressing/Undressing Upper body dressing/undressing steps patient completed: Thread/unthread right sleeve of pullover shirt/dresss;Thread/unthread left sleeve of pullover shirt/dress;Put head through opening of pull over shirt/dress Upper body dressing/undressing: 4: Min-Patient completed 75 plus % of tasks FIM - Lower Body Dressing/Undressing Lower body dressing/undressing steps patient completed: Thread/unthread right underwear leg;Thread/unthread left underwear leg;Thread/unthread right pants leg;Thread/unthread left pants leg;Don/Doff right shoe;Don/Doff left shoe;Fasten/unfasten right shoe;Fasten/unfasten left shoe;Don/Doff right sock;Don/Doff left sock Lower body dressing/undressing: 4: Min-Patient completed 75 plus % of tasks  FIM -  Toileting Toileting steps completed by patient: Adjust clothing prior to toileting;Performs perineal hygiene;Adjust clothing after toileting Toileting Assistive Devices: Grab bar or rail for support Toileting: 4: Steadying assist  FIM - Diplomatic Services operational officer Devices: Art gallery manager Transfers: 4-To toilet/BSC: Min A (steadying Pt. > 75%);4-From toilet/BSC:  Min A (steadying Pt. > 75%)  FIM - Bed/Chair Transfer Bed/Chair Transfer Assistive Devices: Therapist, occupational: 4: Bed > Chair or W/C: Min A (steadying Pt. > 75%);4: Chair or W/C > Bed: Min A (steadying Pt. > 75%)  FIM - Locomotion: Wheelchair Distance: 100 Locomotion: Wheelchair: 2: Travels 50 - 149 ft with minimal assistance (Pt.>75%) FIM - Locomotion: Ambulation Locomotion: Ambulation Assistive Devices: TEFL teacher Ambulation/Gait Assistance: 4: Min assist Locomotion: Ambulation: 2: Travels 50 - 149 ft with minimal assistance (Pt.>75%)  Medical Problem List and Plan:  1. 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 DVT Prophylaxis/Anticoagulation: Right upper extremity DVT. Continue Coumadin therapy. Monitor platelet counts any signs of bleeding  3. pain management. Oxycodone 10 mg every 6 hours as needed- under control 4. Mood: Amantadine 100 mg 2 times a day, Klonopin0.5 mg  -- wean off, Inderal 30 mg every 12 hours, Ativan 1 mg every 4 as needed anxiety.t  Continue to monitor and modify behavior program,   -celexa qhs Agitation at noc increased Seroquel to 50mg ---will wean this also to off.  5. Neuropsych: This patient is not capable of making decisions on his/her own behalf.  6. Multiple facial fractures. Conservative care per ENT Dr. Emeline Darling  7. L1 transverse process fracture. Conservative care,   scheduled oxycodone  8. VDRF/tracheostomy.     -trach out- site closed 9. Gastrostomy tube 07/03/2012.  Marland Kitchen   On  D3/nectar. Managing well.   -removed  G-tube, no  issues 11. MRSA tracheobronchitis. -resolved 12. History diabetes mellitus. Continue Lantus insulin and Glucophage as directed. Sugars under good control     13. Urinary retention.Flomax 0.8 mg daily. Has been continent  14.  R PCA infarct. Doesn't appear to have gross visual field deficits.   15.    R ulnar fx -cast - consider follow up films, check with ortho  -pt has better awareness now to protect the hand  -check with ortho regarding follow up before we dc him  LOS (Days) 32 A FACE TO FACE EVALUATION WAS PERFORMED  Ikea Demicco T 09/09/2012, 6:54 AM

## 2012-09-09 NOTE — Progress Notes (Signed)
Occupational Therapy Session Note  Patient Details  Name: Alex Foster MRN: [id number] Date of Birth: 06-23-1946  Today's Date: 09/09/2012 Time: 0730-0815 Time Calculation (min): 45 min  Skilled Therapeutic Interventions/Progress Updates:    1:1 self care retraining at shower level. Pt demonstrated decreased frustration during tasks and increased ability to don shirt and pants with extra time and praise. Focus on sit to stands, standing balance, simple problem solving  Therapy Documentation Precautions:  Precautions Precautions: Fall Precaution Comments: L shoulder subluxation and L side hemiparesis. Peg tube , right wrist fracture: in a short arm cast  Required Braces or Orthoses: Other Brace/Splint (R arm cast) Other Brace/Splint: short arm cast R UE Restrictions Weight Bearing Restrictions: Yes RUE Weight Bearing: Non weight bearing Pain:  no c/o pain  See FIM for current functional status  Therapy/Group: Individual Therapy  [name] [facility] 09/09/2012, 11:26 AM

## 2012-09-09 NOTE — Progress Notes (Signed)
Physical Therapy Session Note  Patient Details  Name: Alex Foster MRN: [id number] Date of Birth: Aug 14, 1946  Today's Date: 09/09/2012 Time: 1000-1045 Time Calculation (min): 45 min  Short Term Goals: Week 3:  PT Short Term Goal 1 (Week 3): Pt will propel w/c with supervision 25' in controlled environment PT Short Term Goal 2 (Week 3): Pt will gait with mod A 25' in controlled environment  Skilled Therapeutic Interventions/Progress Updates:   Pt propels w/c in hospital environment x 150 x 2 with min assist. Nustep x 4 minutes x 2 at level 3 with BLE's.  Ambulated 110' x 2 with B platform RW and min assist.  Needs cues during treatment for safety and carryover of instruction.   Therapy Documentation Precautions:  Precautions Precautions: Fall Precaution Comments: L shoulder subluxation and L side hemiparesis. Peg tube , right wrist fracture: in a short arm cast  Required Braces or Orthoses: Other Brace/Splint (R arm cast) Other Brace/Splint: short arm cast R UE Restrictions Weight Bearing Restrictions: Yes RUE Weight Bearing: Non weight bearing   Pain:  No c/o pain during session.     See FIM for current functional status  Therapy/Group: Individual Therapy  Newell Coral 09/09/2012, 12:49 PM

## 2012-09-09 NOTE — Progress Notes (Signed)
Speech Language Pathology Daily Session Note  Patient Details  Name: Alex Foster MRN: [id number] Date of Birth: 08-Nov-1945  Today's Date: 09/09/2012 Time: 11:30-12:00 Time Calculation (min): 30 min  Short Term Goals: Week 4: SLP Short Term Goal 1 (Week 4): Pt will consume Dys. 3 textures and demonstrate efficient mastication without overt s/s of aspiration with  Mod I. SLP Short Term Goal 1 - Progress (Week 4): Progressing toward goal SLP Short Term Goal 2 (Week 4): Pt will consume trials of thin liquids via cup without overt s/s of aspiration with Min A verbal cues.  SLP Short Term Goal 3 (Week 4): Pt will demonstrate functional problem solving for mildly complex tasks with Min verbal cues.  SLP Short Term Goal 4 (Week 4): Pt will demonstrate divided attention in a group setting with Mod A verbal cues.  SLP Short Term Goal 5 (Week 4): Pt will recall new, daily information with Mod I.   Skilled Therapeutic Interventions: Treatment focused on utilization of swallow precautions and safety with diet/liquids during [employer].  Pt. required intermittent min cues to implement swallow precautions with one mild coughing episode.  Pt. appears to be tolerating diet/liquids.   FIM:  FIM - Eating Eating Activity: 5: Needs verbal cues/supervision  Pain Pain Assessment Pain Assessment: No/denies pain  Therapy/Group: Group Therapy  Royce Macadamia 09/09/2012, 3:33 PM

## 2012-09-09 NOTE — Progress Notes (Signed)
ANTICOAGULATION CONSULT NOTE - Follow Up Consult  Pharmacy Consult:  Coumadin Indication:  RUE DVT  Allergies  Allergen Reactions  . Penicillins    Patient Measurements: Weight: 210 lb 15.7 oz (95.7 kg)  Vital Signs: Temp: 98.2 F (36.8 C) (12/09 0500) Temp src: Oral (12/09 0500) BP: 116/54 mmHg (12/09 0500) Pulse Rate: 53  (12/09 0500)  Labs:  Basename 09/09/12 0900 09/08/12 0700 09/07/12 0630  HGB 11.5* -- --  HCT 35.3* -- --  PLT 136* -- --  APTT -- -- --  LABPROT 23.8* 27.3* 29.7*  INR 2.24* 2.69* 3.02*  HEPARINUNFRC -- -- --  CREATININE -- -- --  CKTOTAL -- -- --  CKMB -- -- --  TROPONINI -- -- --       Assessment: 24 YOM admitted to rehab s/p TBI / multi-trauma with hemorrhagic contusion to the head.  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  He developed and upper extremity DVT and is now continued on Coumadin.  INR therapeutic no bleeding reported.  PTLC stable   Goal of Therapy:  INR 2 - 3 (aim for 2 - 2.5 d/t recent TBI with SAH)    Plan:  - Coumadin 7.5mg  alternating with 5mg  PO daily - Daily PT / INR for now - Weekly CBC    Talbert Cage, PharmD Pager:  [phone] 09/09/2012, 12:37 PM

## 2012-09-10 ENCOUNTER — Encounter (HOSPITAL_COMMUNITY): Payer: Self-pay | Admitting: Occupational Therapy

## 2012-09-10 ENCOUNTER — Inpatient Hospital Stay (HOSPITAL_COMMUNITY): Payer: Medicaid Other | Admitting: Speech Pathology

## 2012-09-10 ENCOUNTER — Inpatient Hospital Stay (HOSPITAL_COMMUNITY): Payer: Medicaid Other | Admitting: *Deleted

## 2012-09-10 LAB — GLUCOSE, CAPILLARY
Glucose-Capillary: 126 mg/dL — ABNORMAL HIGH (ref 70–99)
Glucose-Capillary: 161 mg/dL — ABNORMAL HIGH (ref 70–99)
Glucose-Capillary: 140 mg/dL — ABNORMAL HIGH (ref 70–99)

## 2012-09-10 NOTE — Progress Notes (Signed)
Speech Language Pathology Daily Session Note  Patient Details  Name: Alex Foster MRN: [id number] Date of Birth: 1945/10/25  Today's Date: 09/10/2012 Time: 1130-1145 Time Calculation (min): 15 min  Short Term Goals: Week 4: SLP Short Term Goal 1 (Week 4): Pt will consume Dys. 3 textures and demonstrate efficient mastication without overt s/s of aspiration with  Mod I. SLP Short Term Goal 1 - Progress (Week 4): Progressing toward goal SLP Short Term Goal 2 (Week 4): Pt will consume trials of thin liquids via cup without overt s/s of aspiration with Min A verbal cues.  SLP Short Term Goal 3 (Week 4): Pt will demonstrate functional problem solving for mildly complex tasks with Min verbal cues.  SLP Short Term Goal 4 (Week 4): Pt will demonstrate divided attention in a group setting with Mod A verbal cues.  SLP Short Term Goal 5 (Week 4): Pt will recall new, daily information with Mod I.   Skilled Therapeutic Interventions: Group session, co-treatment with OT; SLP focused on addressing dysphagia goals. Patient consumed Dys.3 textures and nectar-thick liquids with supervision verbal cues to carryover safe swallow compensatory strategies of monitoring and correcting intermittent wet vocal quality X 1. SLP recommends upgrade to intermittent staff supervision with p.o. after tray set up.    FIM:  FIM - Eating Eating Activity: 5: Supervision/cues;5: Set-up assist for open containers  Pain Pain Assessment Pain Assessment: No/denies pain  Therapy/Group: Individual Therapy  Charlane Ferretti., CCC-SLP [phone]  Alex Foster 09/10/2012, 1:22 PM

## 2012-09-10 NOTE — Discharge Summary (Signed)
  Discharge summary job [id number]

## 2012-09-10 NOTE — Progress Notes (Signed)
ANTICOAGULATION CONSULT NOTE - Follow Up Consult  Pharmacy Consult:  Coumadin Indication:  RUE DVT  Allergies  Allergen Reactions  . Penicillins    Patient Measurements: Weight: 210 lb 15.7 oz (95.7 kg)  Vital Signs: Temp: 97.4 F (36.3 C) (12/10 0622) Temp src: Oral (12/10 0622) BP: 141/84 mmHg (12/10 0622) Pulse Rate: 65  (12/10 0622)  Labs:  Basename 09/10/12 0700 09/09/12 0900 09/08/12 0700  HGB -- 11.5* --  HCT -- 35.3* --  PLT -- 136* --  APTT -- -- --  LABPROT 23.9* 23.8* 27.3*  INR 2.25* 2.24* 2.69*  HEPARINUNFRC -- -- --  CREATININE -- -- --  CKTOTAL -- -- --  CKMB -- -- --  TROPONINI -- -- --     Assessment: 33 YOM admitted to rehab s/p TBI / multi-trauma with hemorrhagic contusion to the head.  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  He developed upper extremity DVT and is now continued on Coumadin.  INR therapeutic no bleeding reported.     Goal of Therapy:  INR 2 - 3 (aim for 2 - 2.5 d/t recent TBI with [facility])    Plan:  - Coumadin 7.5mg  alternating with 5mg  PO daily - Daily PT / INR for now - noted pt may d/c home soon - Weekly CBC   Christoper Fabian, PharmD, BCPS Clinical pharmacist, pager [phone] 09/10/2012, 1:53 PM

## 2012-09-10 NOTE — Progress Notes (Signed)
Patient ID: Alex Foster, male   DOB: [birth year], 66 y.o.   MRN: [id number]  Subjective/Complaints: Rolled in bed and hit left brow. A 12 point review of systems has been performed and if not noted above is otherwise negative.   Objective: Vital Signs: Blood pressure 141/84, pulse 65, temperature 97.4 F (36.3 C), temperature source Oral, resp. rate 18, weight 95.7 kg (210 lb 15.7 oz), SpO2 95.00%. No results found. Results for orders placed during the hospital encounter of 08/08/12 (from the past 72 hour(s))  GLUCOSE, CAPILLARY     Status: Abnormal   Collection Time   09/07/12  7:32 AM      Component Value Range Comment   Glucose-Capillary 126 (*) 70 - 99 mg/dL    Comment 1 Notify RN     GLUCOSE, CAPILLARY     Status: Abnormal   Collection Time   09/07/12 11:43 AM      Component Value Range Comment   Glucose-Capillary 182 (*) 70 - 99 mg/dL    Comment 1 Notify RN     GLUCOSE, CAPILLARY     Status: Abnormal   Collection Time   09/07/12  4:44 PM      Component Value Range Comment   Glucose-Capillary 112 (*) 70 - 99 mg/dL    Comment 1 Notify RN     GLUCOSE, CAPILLARY     Status: Abnormal   Collection Time   09/07/12  9:22 PM      Component Value Range Comment   Glucose-Capillary 143 (*) 70 - 99 mg/dL    Comment 1 Notify RN     PROTIME-INR     Status: Abnormal   Collection Time   09/08/12  7:00 AM      Component Value Range Comment   Prothrombin Time 27.3 (*) 11.6 - 15.2 seconds    INR 2.69 (*) 0.00 - 1.49   GLUCOSE, CAPILLARY     Status: Abnormal   Collection Time   09/08/12  7:50 AM      Component Value Range Comment   Glucose-Capillary 110 (*) 70 - 99 mg/dL   GLUCOSE, CAPILLARY     Status: Abnormal   Collection Time   09/08/12 11:53 AM      Component Value Range Comment   Glucose-Capillary 107 (*) 70 - 99 mg/dL    Comment 1 Notify RN     GLUCOSE, CAPILLARY     Status: Abnormal   Collection Time   09/08/12  4:56 PM      Component Value Range Comment   Glucose-Capillary  131 (*) 70 - 99 mg/dL    Comment 1 Notify RN     GLUCOSE, CAPILLARY     Status: Abnormal   Collection Time   09/08/12  8:51 PM      Component Value Range Comment   Glucose-Capillary 135 (*) 70 - 99 mg/dL    Comment 1 Notify RN     GLUCOSE, CAPILLARY     Status: Abnormal   Collection Time   09/09/12  7:35 AM      Component Value Range Comment   Glucose-Capillary 118 (*) 70 - 99 mg/dL    Comment 1 Notify RN     PROTIME-INR     Status: Abnormal   Collection Time   09/09/12  9:00 AM      Component Value Range Comment   Prothrombin Time 23.8 (*) 11.6 - 15.2 seconds    INR 2.24 (*) 0.00 - 1.49   CBC  Status: Abnormal   Collection Time   09/09/12  9:00 AM      Component Value Range Comment   WBC 5.4  4.0 - 10.5 K/uL    RBC 4.19 (*) 4.22 - 5.81 MIL/uL    Hemoglobin 11.5 (*) 13.0 - 17.0 g/dL    HCT 16.1 (*) 09.6 - 52.0 %    MCV 84.2  78.0 - 100.0 fL    MCH 27.4  26.0 - 34.0 pg    MCHC 32.6  30.0 - 36.0 g/dL    RDW 04.5  40.9 - 81.1 %    Platelets 136 (*) 150 - 400 K/uL   GLUCOSE, CAPILLARY     Status: Abnormal   Collection Time   09/09/12 11:29 AM      Component Value Range Comment   Glucose-Capillary 125 (*) 70 - 99 mg/dL    Comment 1 Notify RN     GLUCOSE, CAPILLARY     Status: Abnormal   Collection Time   09/09/12  5:06 PM      Component Value Range Comment   Glucose-Capillary 127 (*) 70 - 99 mg/dL    Comment 1 Documented in Chart      Comment 2 Notify RN     GLUCOSE, CAPILLARY     Status: Abnormal   Collection Time   09/09/12  9:44 PM      Component Value Range Comment   Glucose-Capillary 130 (*) 70 - 99 mg/dL    Comment 1 Notify RN       HENT:  Patient with multiple healing abrasions.  Neck:  Neck clean. No drainage from former trach site. Cardiovascular: Normal rate and regular rhythm.  Pulmonary/Chest:  occ rhonchi, no distress Abdominal: Soft. He exhibits no distension.  Former tube site already granulating in with minimal drainage. Neurological: Reflex  Scores:  Tricep reflexes are 2+ on the right side and 3+ on the left side.  Bicep reflexes are 2+ on the right side and 3+ on the left side.  Brachioradialis reflexes are 2+ on the right side and 3+ on the left side.  Patellar reflexes are 2+ on the right side and 3+ on the left side.  Achilles reflexes are 2+ on the right side and 3+ on the left side.  very alert, answers simple questions appropriately. Calls to use the bathroom. Insight and awareness have improved dramatically Has no gross visual field deficits--visual acuity is impaired 4 /5 strength in the left deltoid bicep tricep and grip 4/5 in the right deltoid bicep tricep and grip   4 in the left hip extensor knee extensor synergistic pattern and 4/5 in the right lower extremity  Skin: small wound/bandaid over left eye brow    Assessment/Plan: 1. Functional deficits secondary to Severe TBI multitrauma which require 3+ hours per day of interdisciplinary therapy in a comprehensive inpatient rehab setting. Physiatrist is providing close team supervision and 24 hour management of active medical problems listed below. Physiatrist and rehab team continue to assess barriers to discharge/monitor patient progress toward functional and medical goals.  Placement pending  FIM: FIM - Bathing Bathing Steps Patient Completed: Chest;Right Arm;Left Arm;Abdomen;Front perineal area;Right upper leg;Left upper leg Bathing: 3: Mod-Patient completes 5-7 41f 10 parts or 50-74%  FIM - Upper Body Dressing/Undressing Upper body dressing/undressing steps patient completed: Thread/unthread right sleeve of pullover shirt/dresss;Thread/unthread left sleeve of pullover shirt/dress;Put head through opening of pull over shirt/dress Upper body dressing/undressing: 4: Min-Patient completed 75 plus % of tasks FIM - Lower Body  Dressing/Undressing Lower body dressing/undressing steps patient completed: Thread/unthread right underwear leg;Thread/unthread left  underwear leg;Thread/unthread right pants leg;Thread/unthread left pants leg;Don/Doff right shoe;Don/Doff left shoe;Fasten/unfasten right shoe;Fasten/unfasten left shoe;Don/Doff right sock;Don/Doff left sock Lower body dressing/undressing: 4: Min-Patient completed 75 plus % of tasks  FIM - Toileting Toileting steps completed by patient: Adjust clothing prior to toileting;Performs perineal hygiene;Adjust clothing after toileting Toileting Assistive Devices: Grab bar or rail for support Toileting: 4: Steadying assist  FIM - Diplomatic Services operational officer Devices: Art gallery manager Transfers: 4-To toilet/BSC: Min A (steadying Pt. > 75%);4-From toilet/BSC: Min A (steadying Pt. > 75%)  FIM - Bed/Chair Transfer Bed/Chair Transfer Assistive Devices: Arm rests Bed/Chair Transfer: 4: Bed > Chair or W/C: Min A (steadying Pt. > 75%);4: Chair or W/C > Bed: Min A (steadying Pt. > 75%)  FIM - Locomotion: Wheelchair Distance: 100 Locomotion: Wheelchair: 2: Travels 50 - 149 ft with minimal assistance (Pt.>75%) FIM - Locomotion: Ambulation Locomotion: Ambulation Assistive Devices: TEFL teacher Ambulation/Gait Assistance: 4: Min assist Locomotion: Ambulation: 2: Travels 50 - 149 ft with minimal assistance (Pt.>75%)  Medical Problem List and Plan:  1. 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 DVT Prophylaxis/Anticoagulation: Right upper extremity DVT. Continue Coumadin therapy. Monitor platelet counts any signs of bleeding  3. pain management. Oxycodone 10 mg every 6 hours as needed- under control 4. Mood: Amantadine 100 mg 2 times a day, Klonopin0.5 mg  -- wean off, Inderal 30 mg every 12 hours, Ativan 1 mg every 4 as needed anxiety.t  Continue to monitor and modify behavior program,   -celexa qhs Agitation at noc increased Seroquel to 50mg ---will wean this also to off.  5. Neuropsych: This patient is not capable of making decisions on his/her own behalf.   6. Multiple facial fractures. Conservative care per ENT Dr. Emeline Darling  7. L1 transverse process fracture. Conservative care,   scheduled oxycodone  8. VDRF/tracheostomy.     -trach out- site closed 9. Gastrostomy tube 07/03/2012.  Marland Kitchen   On  D3/nectar. Managing well.   -removed  G-tube, no issues 11. MRSA tracheobronchitis. -resolved 12. History diabetes mellitus. Continue Lantus insulin and Glucophage as directed. Sugars under good control     13. Urinary retention.Flomax 0.8 mg daily. Has been continent  14.  R PCA infarct. Doesn't appear to have gross visual field deficits.   15.    R ulnar fx -cast - consider follow up films, check with ortho  -pt has better awareness now to protect the hand  -check with ortho regarding follow up before we dc him  LOS (Days) 33 A FACE TO FACE EVALUATION WAS PERFORMED  SWARTZ,ZACHARY T 09/10/2012, 7:04 AM

## 2012-09-10 NOTE — Progress Notes (Addendum)
Occupational Therapy Note  Patient Details  Name: Alex Foster MRN: [id number] Date of Birth: 05-16-1946 Today's Date: 09/10/2012  Time: 1145-1200 (cotx with Speech Therapy-group time 1130-1200) Pt denies pain Group Therapy Pt participated in self feeding group with focus on increased use of LUE to assist with opening containers and setting up for meal.  Pt required min verbal cues for portion control.   Lavone Neri [facility] 09/10/2012, 3:18 PM

## 2012-09-10 NOTE — Progress Notes (Signed)
Occupational Therapy Discharge Summary  Patient Details  Name: Alex Foster MRN: [id number] Date of Birth: 04-May-1946  Today's Date: 09/10/2012 Time: 1308-6578 Time Calculation (min): 45 min  1:1 GRAD DAY! Self care retraining at shower level with focus on pt leading session with requesting, sequencing and organizing ADL task- asking for assist as needed. Pt able to perform sit to stands with supervision with increased control. Able to self feed breakfast with only setup- able to pace self and monitor bite size.  Patient has met 13 of 13 long term goals due to improved activity tolerance, improved balance, postural control, ability to compensate for deficits, functional use of  LEFT upper and LEFT lower extremity, improved attention, improved awareness and improved coordination.  Patient to discharge at overall supervision to min A level for al basic ADLs; pt can demonstrate functional ambulation with bilateral platform RW with min A and participate in group outing at w/c level with min A . Pt's behavior is consistent with [place] Level VII. Pt continues to demonstrate poor frustration tolerance and needs A with ownership of self and his care.  Pt and pt's family is to continue to receive therapy at SNF level to further advance his dynamic balance, functional ambulation and independence  with ADLs  Reasons goals not met: n/a  Recommendation:  Patient will benefit from ongoing skilled OT services in skilled nursing facility setting to continue to advance functional skills in the area of BADL.  Equipment: No equipment provided  Reasons for discharge: treatment goals met and discharge from hospital  Patient/family agrees with progress made and goals achieved: Yes  OT Discharge Precautions/Restrictions  Precautions Precautions: Fall Precaution Comments: L shoulder subluxation and L side hemiparesis,, right wrist fracture: in a short arm cast  Restrictions Weight Bearing Restrictions:  Yes RUE Weight Bearing: Non weight bearing Pain Pain Assessment Pain Assessment: No/denies pain ADL  see FIM Vision/Perception  Vision - History Baseline Vision: Wears glasses all the time Patient Visual Report: Blurring of vision;Unable to keep objects in focus Vision - Assessment Eye Alignment: Within Functional Limits Alignment/Gaze Preference:  (much improved- able to track both ways) Saccades: Decreased speed of saccadic movement Perception Perception:  (improved significantly) Praxis Praxis: Impaired Praxis Impairment Details: Motor planning  Cognition Overall Cognitive Status: Impaired Arousal/Alertness: Awake/alert Orientation Level: Oriented X4 Attention: Divided Focused Attention: Appears intact Sustained Attention: Appears intact Alternating Attention: Appears intact Divided Attention: Impaired Divided Attention Impairment: Verbal basic;Functional basic Memory: Impaired Memory Impairment: Decreased recall of new information;Storage deficit;Decreased short term memory Decreased Short Term Memory: Verbal basic;Functional basic Awareness: Impaired Awareness Impairment: Emergent impairment Problem Solving: Impaired Problem Solving Impairment: Verbal complex;Functional complex Reasoning: Impaired Reasoning Impairment: Verbal complex;Functional complex Sequencing: Appears intact Organizing: Appears intact Decision Making: Impaired Decision Making Impairment: Verbal complex;Functional complex Initiating: Appears intact Self Monitoring: Appears intact Self Correcting: Appears intact Behaviors: Poor frustration tolerance Safety/Judgment: Appears intact Comments: improved, able to be left alone without safety belt or bed alarm [place] Scales of Cognitive Functioning: Purposeful/appropriate Sensation Sensation Light Touch: Impaired Detail Light Touch Impaired Details: Impaired LUE Stereognosis: Impaired Detail Stereognosis Impaired Details: Impaired  LUE Hot/Cold: Appears Intact Proprioception Impaired Details: Impaired LUE Additional Comments: improved  Coordination Gross Motor Movements are Fluid and Coordinated: No Coordination and Movement Description: improved  Motor  Motor Motor: Hemiplegia Motor - Discharge Observations: improved alignment and postural control in free space Mobility  Bed Mobility Supine to Sit: 6: Modified independent (Device/Increase time) Sit to Supine: 6: Modified independent (Device/Increase time)  Scooting to [facility]: 6: Modified independent (Device/Increase time) Transfers Sit to Stand: 5: Supervision Stand to Sit: 5: Supervision  Trunk/Postural Assessment  Cervical AROM Overall Cervical AROM Comments: cervical protraction  Thoracic Assessment Thoracic Assessment: Exceptions to [facility] (scapula downward tilted and abducted from spine) Lumbar Assessment Lumbar Assessment:  (posterior pelvic tilt) Postural Control Trunk Control: improved upright posture unsupport by UEs Righting Reactions: delayed but present  Balance Balance Balance Assessed: Yes Static Sitting Balance Static Sitting - Level of Assistance: 6: Modified independent (Device/Increase time) Dynamic Sitting Balance Dynamic Sitting - Level of Assistance: 6: Modified independent (Device/Increase time) Static Standing Balance Static Standing - Balance Support: During functional activity;Bilateral upper extremity supported Static Standing - Level of Assistance: 5: Stand by assistance Dynamic Standing Balance Dynamic Standing - Balance Support: During functional activity Dynamic Standing - Level of Assistance: 4: Min assist Extremity/Trunk Assessment RUE Assessment RUE Assessment: Within Functional Limits (remains in short carm cast/ NWB) LUE Assessment LUE Assessment: Exceptions to [facility] LUE Strength LUE Overall Strength Comments: 3/5; more strength distally than proximal; pt able to close sublux on his own, abnormal posturaling with ROM  greater than 50degrees of shoulder flexion, no tone  noted  See FIM for current functional status  [name] [facility] 09/10/2012, 4:28 PM
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
## 2012-09-10 NOTE — Progress Notes (Signed)
Physical Therapy Session Note  Patient Details  Name: Alex Foster MRN: [id number] Date of Birth: 1946/01/08  Today's Date: 09/10/2012 Time: 4098-1191 Time Calculation (min): 45 min  Skilled Therapeutic Interventions/Progress Updates:  Tx focused on transfers, gait and WC mobility in controlled environment, stairs, and therex for LE strengthening and activity tolerance.   Pt able to navigate WC on room with S only, but needing Min A for distance in controlled environment.  Stand-pivot transfers WC<>bed and WC<>nustep with Min A for steadying.  Sit<>supine with S only, no rails, flat HOB with increased effort.   Sit<>stand with Min A for steadying and anterior translation.   Gait with PFRW and Min A x100 and x30 in controlled environment with steadying assist needed due to increased forward flexion. Pt repeatedly hits L RW leg with foot, needing cues to adjust.   Stairs x 5 with bil rails and Min A for steadying, step-to and cues for safe technique with descent.   Nustep x31min, level 5 with bil UE/LE for strengthening and activity tolerance, average >60spm, requiring only 1 seated rest break at .   Pt needing seated rest breaks between activities, reinforcing safety wit mobility.        Therapy Documentation Precautions:  Precautions Precautions: Fall Precaution Comments: L shoulder subluxation and L side hemiparesis. Peg tube , right wrist fracture: in a short arm cast  Required Braces or Orthoses: Other Brace/Splint (R arm cast) Other Brace/Splint: short arm cast R UE Restrictions Weight Bearing Restrictions: Yes RUE Weight Bearing: Non weight bearing   Pain: Pain Assessment Pain Assessment: No/denies pain Mobility:   Locomotion : Ambulation Ambulation/Gait Assistance: 4: Min assist Wheelchair Mobility Distance: 100   See FIM for current functional status  Therapy/Group: Individual Therapy  Clydene Laming, PT, DPT   Eulogio Ditch, Richardson Dopp M 09/10/2012, 3:04 PM

## 2012-09-11 ENCOUNTER — Encounter (HOSPITAL_COMMUNITY): Payer: Self-pay | Admitting: Occupational Therapy

## 2012-09-11 DIAGNOSIS — S069X9A Unspecified intracranial injury with loss of consciousness of unspecified duration, initial encounter: Secondary | ICD-10-CM
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
## 2012-09-11 NOTE — Progress Notes (Signed)
Report called to admission nurse at [facility], [facility] transported pt to facility, Pt belongings sent with pt

## 2012-09-11 NOTE — Progress Notes (Signed)
Physical Therapy Discharge Summary  Patient Details  Name: Omega Slager MRN: [id number] Date of Birth: 1945-11-08  Today's Date: 09/11/2012  Patient has met 5 of 8 long term goals due to improved activity tolerance, improved balance, improved postural control, increased strength, decreased pain, ability to compensate for deficits, functional use of  left upper extremity and left lower extremity, improved attention, improved awareness and improved coordination.  Patient to discharge at a wheelchair level Min Assist, min A with gait with B PFRW.   Reasons goals not met: n/a  Recommendation:  Patient will benefit from ongoing skilled PT services in skilled nursing facility setting to continue to advance safe functional mobility, address ongoing impairments in balance, gait, activity tolerance, and minimize fall risk.  Equipment: to be provided by next level of care  Reasons for discharge: treatment goals met and discharge from hospital  Patient/family agrees with progress made and goals achieved: Yes  PT Discharge Pt is currently min A for gait, w/c mobility and transfers, supervision for bed mobility.  Pt limited by decreased balance, weakness on L side, poor frustration tolerance, NWB status on R wrist, impaired gait.  Pt making good gains and progressing towards supervision level goals.  See FIM for current functional status  Quiera Diffee 09/11/2012, 3:54 PM

## 2012-09-11 NOTE — Progress Notes (Signed)
Speech Language Pathology Discharge Summary  Patient Details  Name: Alex Foster MRN: [id number] Date of Birth: 14-Nov-1945  Today's Date: 09/11/2012  Patient has met 7 of 7 long term goals.  Patient to discharge at overall Supervision;Min level.   Reasons goals not met: N/A   Clinical Impression/Discharge Summary: Pt has made excellent progress and has met 7 of 7 LTG's this reporting period. Currently, pt is demonstrating behaviors consistent with a Rancho Level VIII and requires overall Supervision-Min A for complex problem solving, anticipatory awareness, working memory and divided attention. Pt continues to demonstrate poor frustration tolerance at times but it is easily redirected. Pt is consuming Dys. 3 textures with nectar thick liquids and requires intermittent supervision for utilization of swallowing compensatory strategies. Recommend repeat MBSS to assess for upgrade to thin liquids. Pt is to continue to receive skilled SLP intervention at SNF level to maximize cognitive and swallowing function and reduce caregiver burden.   Care Partner:  Caregiver Able to Provide Assistance: No  Type of Caregiver Assistance: Physical;Cognitive  Recommendation:  24 hour supervision/assistance;Skilled Nursing facility  Rationale for SLP Follow Up: Maximize cognitive function and independence;Maximize swallowing safety;Reduce caregiver burden   Equipment: N/A   Reasons for discharge: Treatment goals met;Discharged from hospital   Patient/Family Agrees with Progress Made and Goals Achieved: Yes   See FIM for current functional status  Micky Sheller 09/11/2012, 7:40 AM

## 2012-09-11 NOTE — Progress Notes (Signed)
Social Work  Discharge Note  The overall goal for the admission was met for:   Discharge location: Yes - plan upon CIR admit was for pt to d/c to SNF  Length of Stay: Yes - 34 days  Discharge activity level: Yes - minimal assistance overall  Home/community participation: Yes  Services provided included: MD, RD, PT, OT, SLP, RN, TR, Pharmacy, Neuropsych and SW  Financial Services: Medicaid  Follow-up services arranged: Other: SNF at [facility]  Comments (or additional information): Have provided pt's brother with all contact information about SNF - brothers plan to continue visiting pt each weekend at local facility  Patient/Family verbalized understanding of follow-up arrangements: Yes  Individual responsible for coordination of the follow-up plan: patient  Confirmed correct DME delivered:  NA  Cambreigh Dearing, LCSW

## 2012-09-11 NOTE — Patient Care Conference (Signed)
Inpatient RehabilitationTeam Conference Note Date: 09/10/2012   Time: 3:25 PM    Patient Name: Alex Foster      Medical Record Number: [id number]  Date of Birth: 1946/06/22 Sex: Male         Room/Bed: 4002/4002-01 Payor Info: Payor: MEDICAID [place]  Plan: MEDICAID [place] ACCESS  Product Type: *No Product type*     Admitting Diagnosis: TRAUMA TBI AND CVA  Admit Date/Time:  08/08/2012  3:19 PM Admission Comments: No comment available   Primary Diagnosis:  Trauma Principal Problem: Trauma  Patient Active Problem List   Diagnosis Date Noted  . Trauma 08/09/2012  . DM (diabetes mellitus) 08/03/2012  . DVT of axillary vein, acute right 08/03/2012  . Anticoagulation goal of INR 2 to 3 08/03/2012  . Hypertension   . Myocardial infarction   . Pleural effusion 06/09/2012  . Mobitz (type) II atrioventricular block 06/04/2012  . Acute respiratory failure with hypoxia 06/02/2012  . Cardiac arrest 06/02/2012  . Cardiogenic shock 06/02/2012  . Tracheobronchitis, MRSA 06/02/2012  . Leukocytosis 06/01/2012  . Lactic acidosis 06/01/2012  . Multiple rib fractures 05/31/2012  . 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 Extensive facial fractures 05/31/2012  . Leg erythema 05/11/2012  . Health care maintenance 02/29/2012  . Dizziness 07/18/2011  . Hepatic encephalopathy 04/12/2011  . Low back pain 03/29/2011  . Physical deconditioning 03/28/2011  . Thrombocytopenia 03/24/2011  . Anasarca 03/24/2011  . Knee pain, left 02/21/2011  . Constipation 11/22/2010  . OTHER DISORDERS OF BONE AND CARTILAGE OTHER 08/17/2010  . RBBB 02/03/2010  . INSOMNIA, CHRONIC 12/16/2009  . BENIGN POSITIONAL VERTIGO 08/10/2008  . HYPERLIPIDEMIA, CONTROLLED 12/04/2007  . DIABETES MELLITUS, TYPE II, WITHOUT COMPLICATIONS 08/23/2007  . TOBACCO ABUSE 05/08/2007  . HYPERTENSION, BENIGN ESSENTIAL, CONTROLLED 05/08/2007  . COPD 05/08/2007    Expected Discharge Date: Expected Discharge Date: 09/11/12 (to  SNF)  Team Members Present: Social Worker Present: Amada Jupiter, LCSW Nurse Present: Other (comment) Milly Jakob Rcom, RN) PT Present: Other (comment) Oretha Milch, PT) OT Present: [name], OT;Ardis Rowan, Corky Crafts, OT SLP Present: Feliberto Gottron, SLP Other (Discipline and Name): Tora Duck, PPS Coordinator     Current Status/Progress Goal Weekly Team Focus  Medical   peg out, no problems  no change  no change   Bowel/Bladder   continent bowel and bladder. LBM 09/09/12  continent bowel and bladder      Swallow/Nutrition/ Hydration             ADL's   goals upgraded to min A, wokring on min A consistantly   overall min A  frustration tolerance, functional ambulation, use of left UE,   Mobility   min A  min A  d/c planning   Communication             Safety/Cognition/ Behavioral Observations  min assist with verbal cues  min assist      Pain   receives oxy 10mg  prn for pain releif  pain less than or equal to two      Skin   peg site red/neosporin cream to area. abraision to left forehead/dry dsg to area  no new skin breakdown         *See Interdisciplinary Assessment and Plan and progress notes for long and short-term goals  Barriers to Discharge: safety, balance, vision    Possible Resolutions to Barriers:  supervision at discharge    Discharge Planning/Teaching Needs:  SNF bed available - plan d/c tomorrow  Team Discussion:  Have received SNF bed offer - plan to d/c tomorrow  Revisions to Treatment Plan:  None   Continued Need for Acute Rehabilitation Level of Care: The patient requires daily medical management by a physician with specialized training in physical medicine and rehabilitation for the following conditions: Daily direction of a multidisciplinary physical rehabilitation program to ensure safe treatment while eliciting the highest outcome that is of practical value to the patient.: Yes Daily medical management of patient stability for  increased activity during participation in an intensive rehabilitation regime.: Yes Daily analysis of laboratory values and/or radiology reports with any subsequent need for medication adjustment of medical intervention for : Post surgical problems;Neurological problems  Alex Foster 09/11/2012, 1:37 PM

## 2012-11-01 ENCOUNTER — Emergency Department (HOSPITAL_COMMUNITY): Payer: Medicare Other

## 2012-11-01 ENCOUNTER — Encounter (HOSPITAL_COMMUNITY): Payer: Self-pay | Admitting: Radiology

## 2012-11-01 ENCOUNTER — Inpatient Hospital Stay (HOSPITAL_COMMUNITY)
Admission: EM | Admit: 2012-11-01 | Discharge: 2012-11-05 | DRG: 004 | Disposition: A | Discharge status: Other Acute Inpatient Hospital | Payer: Medicare Other | Attending: Emergency Medicine | Admitting: Emergency Medicine

## 2012-11-01 ENCOUNTER — Other Ambulatory Visit: Payer: Self-pay

## 2012-11-01 DIAGNOSIS — J9600 Acute respiratory failure, unspecified whether with hypoxia or hypercapnia: Secondary | ICD-10-CM | POA: Diagnosis present

## 2012-11-01 DIAGNOSIS — Y9210 Unspecified residential institution as the place of occurrence of the external cause: Secondary | ICD-10-CM | POA: Diagnosis present

## 2012-11-01 DIAGNOSIS — Z8673 Personal history of transient ischemic attack (TIA), and cerebral infarction without residual deficits: Secondary | ICD-10-CM

## 2012-11-01 DIAGNOSIS — S2242XA Multiple fractures of ribs, left side, initial encounter for closed fracture: Secondary | ICD-10-CM | POA: Diagnosis present

## 2012-11-01 DIAGNOSIS — I10 Essential (primary) hypertension: Secondary | ICD-10-CM | POA: Diagnosis present

## 2012-11-01 DIAGNOSIS — S065XAA Traumatic subdural hemorrhage with loss of consciousness status unknown, initial encounter: Secondary | ICD-10-CM

## 2012-11-01 DIAGNOSIS — J9601 Acute respiratory failure with hypoxia: Secondary | ICD-10-CM | POA: Diagnosis present

## 2012-11-01 DIAGNOSIS — E785 Hyperlipidemia, unspecified: Secondary | ICD-10-CM | POA: Diagnosis present

## 2012-11-01 DIAGNOSIS — I82A19 Acute embolism and thrombosis of unspecified axillary vein: Secondary | ICD-10-CM

## 2012-11-01 DIAGNOSIS — W07XXXA Fall from chair, initial encounter: Secondary | ICD-10-CM | POA: Diagnosis present

## 2012-11-01 DIAGNOSIS — M545 Low back pain, unspecified: Secondary | ICD-10-CM | POA: Diagnosis present

## 2012-11-01 DIAGNOSIS — S065X0A Traumatic subdural hemorrhage without loss of consciousness, initial encounter: Principal | ICD-10-CM | POA: Diagnosis present

## 2012-11-01 DIAGNOSIS — S065X9A Traumatic subdural hemorrhage with loss of consciousness of unspecified duration, initial encounter: Secondary | ICD-10-CM

## 2012-11-01 DIAGNOSIS — Z86718 Personal history of other venous thrombosis and embolism: Secondary | ICD-10-CM

## 2012-11-01 DIAGNOSIS — J95821 Acute postprocedural respiratory failure: Secondary | ICD-10-CM

## 2012-11-01 DIAGNOSIS — Z8614 Personal history of Methicillin resistant Staphylococcus aureus infection: Secondary | ICD-10-CM

## 2012-11-01 DIAGNOSIS — S069X9A Unspecified intracranial injury with loss of consciousness of unspecified duration, initial encounter: Secondary | ICD-10-CM | POA: Diagnosis present

## 2012-11-01 DIAGNOSIS — Z87820 Personal history of traumatic brain injury: Secondary | ICD-10-CM

## 2012-11-01 DIAGNOSIS — E119 Type 2 diabetes mellitus without complications: Secondary | ICD-10-CM | POA: Diagnosis present

## 2012-11-01 DIAGNOSIS — Z79899 Other long term (current) drug therapy: Secondary | ICD-10-CM

## 2012-11-01 DIAGNOSIS — J449 Chronic obstructive pulmonary disease, unspecified: Secondary | ICD-10-CM | POA: Diagnosis present

## 2012-11-01 DIAGNOSIS — J4489 Other specified chronic obstructive pulmonary disease: Secondary | ICD-10-CM | POA: Diagnosis present

## 2012-11-01 DIAGNOSIS — F17200 Nicotine dependence, unspecified, uncomplicated: Secondary | ICD-10-CM | POA: Diagnosis present

## 2012-11-01 DIAGNOSIS — I252 Old myocardial infarction: Secondary | ICD-10-CM

## 2012-11-01 DIAGNOSIS — S2249XA Multiple fractures of ribs, unspecified side, initial encounter for closed fracture: Secondary | ICD-10-CM | POA: Diagnosis present

## 2012-11-01 DIAGNOSIS — W19XXXA Unspecified fall, initial encounter: Secondary | ICD-10-CM | POA: Diagnosis present

## 2012-11-01 LAB — POCT I-STAT 3, ART BLOOD GAS (G3+)
Acid-Base Excess: 4 mmol/L — ABNORMAL HIGH (ref 0.0–2.0)
Bicarbonate: 29.1 mEq/L — ABNORMAL HIGH (ref 20.0–24.0)
TCO2: 30 mmol/L (ref 0–100)
pO2, Arterial: 216 mmHg — ABNORMAL HIGH (ref 80.0–100.0)

## 2012-11-01 LAB — CBC WITH DIFFERENTIAL/PLATELET
Eosinophils Absolute: 0 10*3/uL (ref 0.0–0.7)
Eosinophils Relative: 0 % (ref 0–5)
HCT: 36.7 % — ABNORMAL LOW (ref 39.0–52.0)
Hemoglobin: 12.8 g/dL — ABNORMAL LOW (ref 13.0–17.0)
Lymphs Abs: 1.5 10*3/uL (ref 0.7–4.0)
MCH: 28.9 pg (ref 26.0–34.0)
MCV: 82.8 fL (ref 78.0–100.0)
Monocytes Absolute: 0.7 10*3/uL (ref 0.1–1.0)
Monocytes Relative: 8 % (ref 3–12)
Neutrophils Relative %: 77 % (ref 43–77)
RBC: 4.43 MIL/uL (ref 4.22–5.81)

## 2012-11-01 LAB — URINALYSIS, ROUTINE W REFLEX MICROSCOPIC
Glucose, UA: 250 mg/dL — AB
Protein, ur: >300 mg/dL — AB

## 2012-11-01 LAB — URINE MICROSCOPIC-ADD ON

## 2012-11-01 LAB — OCCULT BLOOD, POC DEVICE: Fecal Occult Bld: POSITIVE — AB

## 2012-11-01 LAB — POCT I-STAT, CHEM 8
BUN: 24 mg/dL — ABNORMAL HIGH (ref 6–23)
Calcium, Ion: 1.17 mmol/L (ref 1.13–1.30)
Chloride: 97 mEq/L (ref 96–112)
Sodium: 134 mEq/L — ABNORMAL LOW (ref 135–145)

## 2012-11-01 LAB — ABO/RH: ABO/RH(D): O POS

## 2012-11-01 LAB — PROTIME-INR: INR: 1.23 (ref 0.00–1.49)

## 2012-11-01 MED ORDER — NITROPRUSSIDE SODIUM 25 MG/ML IV SOLN
0.2500 ug/kg/min | INTRAVENOUS | Status: DC
  Administered 2012-11-02: 0.5 ug/kg/min via INTRAVENOUS
  Administered 2012-11-03: 0.4 ug/kg/min via INTRAVENOUS
  Filled 2012-11-01 (×3): qty 2

## 2012-11-01 MED ORDER — CHLORHEXIDINE GLUCONATE 0.12 % MT SOLN
15.0000 mL | Freq: Two times a day (BID) | OROMUCOSAL | Status: DC
  Administered 2012-11-01 – 2012-11-05 (×8): 15 mL via OROMUCOSAL
  Filled 2012-11-01 (×8): qty 15

## 2012-11-01 MED ORDER — POTASSIUM CHLORIDE IN NACL 20-0.9 MEQ/L-% IV SOLN
INTRAVENOUS | Status: DC
  Administered 2012-11-01: 16:00:00 via INTRAVENOUS
  Administered 2012-11-02: 1000 mL via INTRAVENOUS
  Administered 2012-11-02 – 2012-11-03 (×3): 100 mL/h via INTRAVENOUS
  Administered 2012-11-03 – 2012-11-04 (×2): via INTRAVENOUS
  Administered 2012-11-04: 100 mL/h via INTRAVENOUS
  Administered 2012-11-04 – 2012-11-05 (×2): via INTRAVENOUS
  Filled 2012-11-01 (×11): qty 1000

## 2012-11-01 MED ORDER — PANTOPRAZOLE SODIUM 40 MG PO TBEC: 40.0000 mg | DELAYED_RELEASE_TABLET | Freq: Every day | ORAL | Status: DC

## 2012-11-01 MED ORDER — PIVOT 1.5 CAL PO LIQD
1000.0000 mL | ORAL | Status: DC
  Administered 2012-11-01: 1000 mL
  Filled 2012-11-01: qty 1000

## 2012-11-01 MED ORDER — SUCCINYLCHOLINE CHLORIDE 20 MG/ML IJ SOLN
INTRAMUSCULAR | Status: AC
  Filled 2012-11-01: qty 1

## 2012-11-01 MED ORDER — ETOMIDATE 2 MG/ML IV SOLN
INTRAVENOUS | Status: AC
  Filled 2012-11-01: qty 20

## 2012-11-01 MED ORDER — ROCURONIUM BROMIDE 50 MG/5ML IV SOLN
INTRAVENOUS | Status: AC
  Filled 2012-11-01: qty 2

## 2012-11-01 MED ORDER — ADULT MULTIVITAMIN LIQUID CH
5.0000 mL | Freq: Every day | ORAL | Status: DC
  Administered 2012-11-01 – 2012-11-05 (×5): 5 mL via ORAL
  Filled 2012-11-01 (×5): qty 5

## 2012-11-01 MED ORDER — ANTIINHIBITOR COAGULANT CMPLX IV SOLR
477.0000 [IU] | INTRAVENOUS | Status: AC
  Administered 2012-11-01: 477 [IU] via INTRAVENOUS
  Filled 2012-11-01: qty 477

## 2012-11-01 MED ORDER — SODIUM CHLORIDE 0.9 % IV SOLN
25.0000 ug/h | INTRAVENOUS | Status: DC
  Administered 2012-11-01 – 2012-11-04 (×2): 50 ug/h via INTRAVENOUS
  Filled 2012-11-01 (×2): qty 50

## 2012-11-01 MED ORDER — IOHEXOL 300 MG/ML  SOLN
100.0000 mL | Freq: Once | INTRAMUSCULAR | Status: AC | PRN
  Administered 2012-11-01: 100 mL via INTRAVENOUS

## 2012-11-01 MED ORDER — ONDANSETRON HCL 4 MG PO TABS: 4.0000 mg | ORAL_TABLET | Freq: Four times a day (QID) | ORAL | Status: DC | PRN

## 2012-11-01 MED ORDER — LORAZEPAM 2 MG/ML IJ SOLN
2.0000 mg | Freq: Once | INTRAMUSCULAR | Status: AC
  Administered 2012-11-01: 2 mg via INTRAVENOUS

## 2012-11-01 MED ORDER — PIVOT 1.5 CAL PO LIQD
1000.0000 mL | ORAL | Status: DC
Start: 2012-11-01 — End: 2012-11-01
  Filled 2012-11-01: qty 1000

## 2012-11-01 MED ORDER — ANTIINHIBITOR COAGULANT CMPLX IV SOLR
500.0000 [IU] | INTRAVENOUS | Status: DC
  Filled 2012-11-01: qty 500

## 2012-11-01 MED ORDER — FENTANYL BOLUS VIA INFUSION
25.0000 ug | Freq: Four times a day (QID) | INTRAVENOUS | Status: DC | PRN
  Filled 2012-11-01: qty 100

## 2012-11-01 MED ORDER — ACETAMINOPHEN 160 MG/5ML PO SOLN
650.0000 mg | Freq: Four times a day (QID) | ORAL | Status: DC | PRN
  Administered 2012-11-02 – 2012-11-04 (×2): 650 mg
  Filled 2012-11-01 (×2): qty 20.3

## 2012-11-01 MED ORDER — SUCCINYLCHOLINE CHLORIDE 20 MG/ML IJ SOLN
150.0000 mg | Freq: Once | INTRAMUSCULAR | Status: AC
  Administered 2012-11-01: 150 mg via INTRAVENOUS

## 2012-11-01 MED ORDER — INSULIN ASPART 100 UNIT/ML ~~LOC~~ SOLN
0.0000 [IU] | SUBCUTANEOUS | Status: DC
Start: 2012-11-01 — End: 2012-11-05
  Administered 2012-11-01 (×2): 4 [IU] via SUBCUTANEOUS
  Administered 2012-11-01 – 2012-11-02 (×4): 7 [IU] via SUBCUTANEOUS
  Administered 2012-11-02: 4 [IU] via SUBCUTANEOUS
  Administered 2012-11-02: 3 [IU] via SUBCUTANEOUS
  Administered 2012-11-02: 7 [IU] via SUBCUTANEOUS
  Administered 2012-11-03 (×3): 4 [IU] via SUBCUTANEOUS
  Administered 2012-11-03 – 2012-11-04 (×2): 3 [IU] via SUBCUTANEOUS
  Administered 2012-11-04: 4 [IU] via SUBCUTANEOUS
  Administered 2012-11-04 – 2012-11-05 (×4): 3 [IU] via SUBCUTANEOUS
  Administered 2012-11-05: 4 [IU] via SUBCUTANEOUS
  Administered 2012-11-05: 3 [IU] via SUBCUTANEOUS

## 2012-11-01 MED ORDER — ACETAMINOPHEN 650 MG RE SUPP: 650.0000 mg | Freq: Four times a day (QID) | RECTAL | Status: DC | PRN

## 2012-11-01 MED ORDER — PRO-STAT SUGAR FREE PO LIQD
60.0000 mL | Freq: Four times a day (QID) | ORAL | Status: DC
  Administered 2012-11-01 – 2012-11-03 (×10): 60 mL
  Filled 2012-11-01 (×14): qty 60

## 2012-11-01 MED ORDER — SODIUM CHLORIDE 0.9 % IV SOLN
INTRAVENOUS | Status: DC
  Administered 2012-11-01: 11:00:00 via INTRAVENOUS

## 2012-11-01 MED ORDER — ONDANSETRON HCL 4 MG/2ML IJ SOLN: 4.0000 mg | Freq: Four times a day (QID) | INTRAMUSCULAR | Status: DC | PRN

## 2012-11-01 MED ORDER — ANTIINHIBITOR COAGULANT CMPLX IV SOLR: 500.0000 [IU] | Freq: Once | INTRAVENOUS | Status: DC | PRN

## 2012-11-01 MED ORDER — LIDOCAINE HCL (CARDIAC) 20 MG/ML IV SOLN
INTRAVENOUS | Status: AC
  Filled 2012-11-01: qty 5

## 2012-11-01 MED ORDER — PANTOPRAZOLE SODIUM 40 MG IV SOLR
40.0000 mg | Freq: Every day | INTRAVENOUS | Status: DC
  Administered 2012-11-01 – 2012-11-02 (×2): 40 mg via INTRAVENOUS
  Filled 2012-11-01 (×2): qty 40

## 2012-11-01 MED ORDER — BIOTENE DRY MOUTH MT LIQD
15.0000 mL | Freq: Four times a day (QID) | OROMUCOSAL | Status: DC
  Administered 2012-11-01 – 2012-11-05 (×16): 15 mL via OROMUCOSAL

## 2012-11-01 MED ORDER — PROPOFOL 10 MG/ML IV EMUL
5.0000 ug/kg/min | INTRAVENOUS | Status: DC
  Administered 2012-11-01: 30 ug/kg/min via INTRAVENOUS
  Administered 2012-11-01: 33.33 ug/kg/min via INTRAVENOUS
  Administered 2012-11-01: 25 ug/kg/min via INTRAVENOUS
  Administered 2012-11-02: 20 ug/kg/min via INTRAVENOUS
  Administered 2012-11-02 (×2): 30 ug/kg/min via INTRAVENOUS
  Administered 2012-11-02: 20 ug/kg/min via INTRAVENOUS
  Administered 2012-11-03: 15 ug/kg/min via INTRAVENOUS
  Administered 2012-11-03: 27 ug/kg/min via INTRAVENOUS
  Administered 2012-11-03 (×2): 30 ug/kg/min via INTRAVENOUS
  Administered 2012-11-03 – 2012-11-04 (×2): 15 ug/kg/min via INTRAVENOUS
  Administered 2012-11-04 (×2): 25 ug/kg/min via INTRAVENOUS
  Administered 2012-11-04: 27 ug/kg/min via INTRAVENOUS
  Administered 2012-11-05: 25 ug/kg/min via INTRAVENOUS
  Filled 2012-11-01 (×18): qty 100

## 2012-11-01 MED ORDER — ROCURONIUM BROMIDE 50 MG/5ML IV SOLN
1.0000 mg/kg | Freq: Once | INTRAVENOUS | Status: AC
  Administered 2012-11-01: 100 mg via INTRAVENOUS

## 2012-11-01 MED ORDER — VITAMIN E 15 UNIT/0.3ML PO SOLN
400.0000 [IU] | Freq: Three times a day (TID) | ORAL | Status: DC
  Filled 2012-11-01 (×2): qty 8

## 2012-11-01 MED ORDER — ETOMIDATE 2 MG/ML IV SOLN
30.0000 mg | Freq: Once | INTRAVENOUS | Status: AC
  Administered 2012-11-01: 30 mg via INTRAVENOUS

## 2012-11-01 MED ORDER — LORAZEPAM 2 MG/ML IJ SOLN
4.0000 mg | Freq: Once | INTRAMUSCULAR | Status: AC
  Administered 2012-11-01: 4 mg via INTRAVENOUS

## 2012-11-01 MED ORDER — LORAZEPAM 2 MG/ML IJ SOLN
1.0000 mg | Freq: Once | INTRAMUSCULAR | Status: AC
  Administered 2012-11-01: 1 mg via INTRAVENOUS
  Filled 2012-11-01: qty 1

## 2012-11-01 MED ORDER — SELENIUM 50 MCG PO TABS
200.0000 ug | ORAL_TABLET | Freq: Every day | ORAL | Status: DC
  Administered 2012-11-01 – 2012-11-05 (×5): 200 ug
  Filled 2012-11-01 (×5): qty 4

## 2012-11-01 MED ORDER — VITAMIN C 500 MG PO TABS
1000.0000 mg | ORAL_TABLET | Freq: Three times a day (TID) | ORAL | Status: DC
  Administered 2012-11-01 – 2012-11-05 (×12): 1000 mg
  Filled 2012-11-01 (×15): qty 2

## 2012-11-01 MED ORDER — LIDOCAINE HCL (CARDIAC) 20 MG/ML IV SOLN
100.0000 mg | Freq: Once | INTRAVENOUS | Status: AC
  Administered 2012-11-01: 100 mg via INTRAVENOUS

## 2012-11-01 MED ORDER — LORAZEPAM 2 MG/ML IJ SOLN
INTRAMUSCULAR | Status: AC
  Filled 2012-11-01: qty 1

## 2012-11-01 MED ORDER — VITAMIN K1 10 MG/ML IJ SOLN
10.0000 mg | INTRAVENOUS | Status: AC
  Administered 2012-11-01: 10 mg via INTRAVENOUS
  Filled 2012-11-01: qty 1

## 2012-11-01 MED ORDER — NITROPRUSSIDE SODIUM 25 MG/ML IV SOLN
0.2500 ug/kg/min | INTRAVENOUS | Status: AC
  Administered 2012-11-01: 0.333 ug/kg/min via INTRAVENOUS
  Filled 2012-11-01: qty 2

## 2012-11-01 MED ORDER — LORAZEPAM 2 MG/ML IJ SOLN
INTRAMUSCULAR | Status: AC
  Filled 2012-11-01: qty 2

## 2012-11-01 NOTE — Progress Notes (Signed)
*  PRELIMINARY RESULTS* Vascular Ultrasound Right upper extremity venous duplex has been completed.  Preliminary findings: No evidence of Deep or Superficial thrombosis involving the right upper extremity. Cannot clearly visualize right subclavian vein, but Color Doppler flow does not suggest DVT.  Farrel Demark, RDMS, RVT 11/01/2012, 4:51 PM

## 2012-11-01 NOTE — ED Notes (Signed)
Pt returned from CT °

## 2012-11-01 NOTE — ED Notes (Signed)
Dr. Jacubowitz back at the bedside.  

## 2012-11-01 NOTE — Progress Notes (Signed)
UR completed.  Pt known to trauma service from previous 69-day stay in late 2013.  Pt turned 67yrs old the day before yesterday which should make him Medicare eligible which would give him ability to transfer to [facility] after trach placed.  CSW checking with financial counseling and I have notified [facility] of potential admission if Medicare is in fact effective. Await progression of care into next week to make further plans.

## 2012-11-01 NOTE — ED Notes (Signed)
Pt transported to CT via stretcher with this RN and monitor.

## 2012-11-01 NOTE — ED Notes (Signed)
Dr. Danielle Dess at the bedside.

## 2012-11-01 NOTE — Clinical Social Work Note (Signed)
Patient is know to trauma service from a previous 69 day stay in late 2013.  Patient is from [facility] - CSW to contact patient brother, Duffy Rhody, to complete full assessment and discuss patient plan of care during this hospitalization.  CSW spoke with Frances Maywood in financial counseling who confirmed active Medicare part A and B with ID [id number].  CSW available for support as needed.  Macario Golds, [place] [phone]

## 2012-11-01 NOTE — H&P (Signed)
Bruising on the right chest wall appears to be old.  Currently, he is extremely hypertensive with SBP > 240.  Has been started on Nipride drip.  May need to be switched to Esmolol later.    Dr. Danielle Dess has seen the patient.  Not sure if he will need surgery, but I doubt it.  Have reversed his minimally elevated INR with Vit K, FEIBA, and FFP.  Will recheck INR.  He is not that far our from a prior TBI.  Not sure how much functional rrecovery he will have this time.  This patient has been seen and I agree with the findings and treatment plan.  Marta Lamas. Gae Bon, MD, FACS [phone] (pager) [phone] (direct pager) Trauma Surgeon

## 2012-11-01 NOTE — Progress Notes (Signed)
INITIAL NUTRITION ASSESSMENT  DOCUMENTATION CODES Per approved criteria  -Obesity Unspecified   INTERVENTION:  Initiate TF via OG tube with Pivot 1.5 at 10 ml/h, increase by 10 ml every 4 hours to goal rate of 15 ml/h with Prostat 60 ml QID to provide 1340 kcals, 154 gm protein, 273 ml free water daily.  TF plus Propofol will provide a total of 1868 kcals (25 kcals/kg ideal weight).   Liquid MVI daily via tube to help meet 100% DRI's.  NUTRITION DIAGNOSIS: Inadequate oral intake related to inability to eat as evidenced by NPO status.   Goal: Enteral nutrition to provide 60-70% of estimated calorie needs (22-25 kcals/kg ideal body weight) and >/= 90% of estimated protein needs, based on ASPEN guidelines for permissive underfeeding in critically ill obese individuals.  Monitor:  TF tolerance/adequacy, labs, weight trend, vent status.  Reason for Assessment: MD Consult for TF initiation and management.  67 y.o. male  Admitting Dx: TBI w/SDH S/P fall at nursing facility; acute respiratory failure; Left rib fxs x2  ASSESSMENT: Patient suffered an unwitnessed fall yesterday at the nursing home. Today nursing reported that today he would not speak. His mental status gradually declined throughout the course of the morning. Patient normally ambulates with assistance and is talkative. He was intubated by EDP due to low GCS.  Patient was previously in the hospital for several months at the end of last year with a TBI and extensive facial fractures.  Had a trach placed during this hospitalization.  Patient is currently intubated on ventilator support.  MV: 12.3 Temp:Temp (24hrs), Avg:99.1 F (37.3 C), Min:98.2 F (36.8 C), Max:99.5 F (37.5 C)  Propofol: 20 ml/hr providing 528 kcals/d   Height: Ht Readings from Last 1 Encounters:  11/01/12 5\' 9"  (1.753 m)    Weight: Wt Readings from Last 1 Encounters:  11/01/12 220 lb 7.4 oz (100 kg)    Ideal Body Weight: 72.7 kg  % Ideal  Body Weight: 138%  Wt Readings from Last 10 Encounters:  11/01/12 220 lb 7.4 oz (100 kg)  09/05/12 210 lb 15.7 oz (95.7 kg)  08/06/12 207 lb 10.8 oz (94.2 kg)  08/06/12 207 lb 10.8 oz (94.2 kg)  08/06/12 207 lb 10.8 oz (94.2 kg)  08/06/12 207 lb 10.8 oz (94.2 kg)  05/24/12 234 lb (106.142 kg)  05/10/12 231 lb 3.2 oz (104.872 kg)  02/29/12 224 lb (101.606 kg)  11/24/11 219 lb 1 oz (99.366 kg)    Usual Body Weight: 210 lb during last hospitalization  % Usual Body Weight: 105%  BMI:  Body mass index is 32.56 kg/(m^2). class 1 obesity  Estimated Nutritional Needs: Kcal: 2200 (calorie goal for underfeeding = 1600-1820) Protein: >145 gm Fluid: 2.2 L  Skin: old bruising and abrasions to chest and back; ulceration noted under the flange of trach where suture sites were at all 4 sites--stage 3 wound  Diet Order: NPO; Pivot 1.5, goal rate 30 ml/h  EDUCATION NEEDS: -Education not appropriate at this time   Intake/Output Summary (Last 24 hours) at 11/01/12 1552 Last data filed at 11/01/12 1451  Gross per 24 hour  Intake   1071 ml  Output   1500 ml  Net   -429 ml    Labs:   Lab 11/01/12 1108  NA 134*  K 4.2  CL 97  CO2 --  BUN 24*  CREATININE 1.30  CALCIUM --  MG --  PHOS --  GLUCOSE 249*    CBG (last 3)  No  results found for this basename: GLUCAP:3 in the last 72 hours  Scheduled Meds:   . antiseptic oral rinse  15 mL Mouth Rinse QID  . chlorhexidine  15 mL Mouth Rinse BID  . insulin aspart  0-20 Units Subcutaneous Q4H  . pantoprazole  40 mg Oral Daily   Or  . pantoprazole (PROTONIX) IV  40 mg Intravenous Daily  . selenium  200 mcg Per Tube Daily  . vitamin C  1,000 mg Per Tube Q8H  . vitamin e  400 Units Per Tube Q8H    Continuous Infusions:   . sodium chloride 20 mL/hr at 11/01/12 1318  . 0.9 % NaCl with KCl 20 mEq / L    . feeding supplement (PIVOT 1.5 CAL)    . fentaNYL infusion INTRAVENOUS    . propofol 25 mcg/kg/min (11/01/12 1419)    Past  Medical History  Diagnosis Date  . Stroke 01/2004    Intracebral hemorrhage in the setting of HTN  . Hyperlipidemia   . COPD (chronic obstructive pulmonary disease)     Active smoker, has oxygen for nightime use.  Cannot tolerate using mask.  . Low back pain     Go[facility] to [facility]  . H/O alcohol abuse   . Myocardial infarction   . Hypertension   . Diabetes mellitus   . 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 Extensive facial fractures 05/31/2012  . MRSA (methicillin resistant staphylococcus aureus) pneumonia 06/02/2012    Past Surgical History  Procedure Date  . Transthoracic echocardiogram 01/2010    EF 60%, mild LVH, mild RV dilation with normal systolic function  . Nm myoview ltd 01/2010    Lexixan myoview: EF 67%, no ischemia or infarction  . Abi 01/2006    0.93 Normal  . Anteriolisthesis 06/26/2006  . Electrocardiogram 01/02/2006    1st degree block  . Cataract extraction, bilateral     per patient    Joaquin Courts, RD, LDN, CNSC Pager# [phone] After Hours Pager# [phone]

## 2012-11-01 NOTE — ED Notes (Signed)
Per [facility], pt from [facility] for fall yesterday which he was not evaluated for. This morning staff noticed him acting differently from normal which is walkie talkie. Pt apparently hasn't talked since yesterday but staff reported

## 2012-11-01 NOTE — ED Notes (Signed)
MD made aware of pts BP 
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
## 2012-11-01 NOTE — ED Notes (Addendum)
Dr. Lindie Spruce and Earney Hamburg, PA for Surgery at the bedside.
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
## 2012-11-01 NOTE — ED Notes (Signed)
Portable xrays being completed 

## 2012-11-01 NOTE — ED Notes (Addendum)
10 mg Etomidate from RSI kit wasted 50 mg Succinlycholine from RSI kit wasted

## 2012-11-02 ENCOUNTER — Inpatient Hospital Stay (HOSPITAL_COMMUNITY): Payer: Medicare Other

## 2012-11-02 LAB — BASIC METABOLIC PANEL
Calcium: 8.8 mg/dL (ref 8.4–10.5)
Creatinine, Ser: 1.13 mg/dL (ref 0.50–1.35)
GFR calc non Af Amer: 66 mL/min — ABNORMAL LOW (ref >90)
Glucose, Bld: 229 mg/dL — ABNORMAL HIGH (ref 70–99)
Sodium: 134 mEq/L — ABNORMAL LOW (ref 135–145)

## 2012-11-02 LAB — PREPARE FRESH FROZEN PLASMA: Unit division: 0

## 2012-11-02 LAB — GLUCOSE, CAPILLARY
Glucose-Capillary: 195 mg/dL — ABNORMAL HIGH (ref 70–99)
Glucose-Capillary: 206 mg/dL — ABNORMAL HIGH (ref 70–99)
Glucose-Capillary: 241 mg/dL — ABNORMAL HIGH (ref 70–99)

## 2012-11-02 LAB — CBC
MCH: 28.7 pg (ref 26.0–34.0)
MCHC: 34.1 g/dL (ref 30.0–36.0)
MCV: 84 fL (ref 78.0–100.0)
Platelets: 157 10*3/uL (ref 150–400)

## 2012-11-02 LAB — PROTIME-INR
INR: 1.13 (ref 0.00–1.49)
Prothrombin Time: 14.3 seconds (ref 11.6–15.2)

## 2012-11-02 MED ORDER — INSULIN GLARGINE 100 UNIT/ML ~~LOC~~ SOLN
5.0000 [IU] | Freq: Every day | SUBCUTANEOUS | Status: AC
  Administered 2012-11-02: 5 [IU] via SUBCUTANEOUS

## 2012-11-02 MED ORDER — MUPIROCIN 2 % EX OINT
1.0000 "application " | TOPICAL_OINTMENT | Freq: Two times a day (BID) | CUTANEOUS | Status: DC
  Administered 2012-11-03 – 2012-11-05 (×6): 1 via NASAL
  Filled 2012-11-02: qty 22

## 2012-11-02 MED ORDER — CHLORHEXIDINE GLUCONATE CLOTH 2 % EX PADS
6.0000 | MEDICATED_PAD | Freq: Every day | CUTANEOUS | Status: DC
  Administered 2012-11-03 – 2012-11-05 (×3): 6 via TOPICAL

## 2012-11-02 MED ORDER — PANTOPRAZOLE SODIUM 40 MG PO PACK
40.0000 mg | PACK | Freq: Every day | ORAL | Status: DC
  Administered 2012-11-02 – 2012-11-05 (×4): 40 mg
  Filled 2012-11-02 (×7): qty 20

## 2012-11-02 NOTE — Progress Notes (Signed)
I saw the patient, participated in the history, exam and medical decision making, and concur with the physician assistant's note above.   Tolerating PS so far.  Will do SBT in am Repeat head ct stable Hold TF today.  Increase mIVF for low uop Diabetes Mellitus - on SSI, requiring almost 10u each time. Will see how blood sugars change with cessation of tube feeds. If still running high and requiring large coverage, will start lantus INR ok. hgb stable.  Cont nitroprusside for HTN.  Discussed care with pt's brothers  Additional comments:I reviewed the patient's new clinical lab test results. cbc/bmet and I reviewed the patients new imaging test results. initial radiologic studies.   Critical Care Total Time*: 30 Minutes  *Care during the described time interval was provided by me and/or other providers on the critical care team. I have reviewed this patient's available data, including medical history, events of note, physical examination and test results as part of my evaluation.   Mary Sella. Andrey Campanile, MD, FACS General, Bariatric, & Minimally Invasive Surgery [facility], [place]
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
## 2012-11-02 NOTE — Progress Notes (Addendum)
Family called from Nursing Home, patient missing cell phone, wallet, wrist watch, blue jeans and T-shirt that was brought with him to ED.  RN, called ED to report missing items spoke with ED Secretary, who is checking for patient belongings.  Family updated.  ED Secretary called back to say.  Alex Foster arrived in the ED yesterday with T-shirt and Diaper only.  Family called and updated.  Watch found in denture cup by bedside.  Called family and they asked that we leave watch in denture cup in patient drawer.

## 2012-11-03 ENCOUNTER — Inpatient Hospital Stay (HOSPITAL_COMMUNITY): Payer: Medicare Other

## 2012-11-03 LAB — BLOOD GAS, ARTERIAL
Bicarbonate: 27.1 mEq/L — ABNORMAL HIGH (ref 20.0–24.0)
Drawn by: 331001
O2 Saturation: 94.5 %
O2 Saturation: 89.8 %
PEEP: 5 cmH2O
PEEP: 5 cmH2O
RATE: 14 resp/min
TCO2: 28.4 mmol/L (ref 0–100)
pCO2 arterial: 58.6 mmHg (ref 35.0–45.0)
pH, Arterial: 7.325 — ABNORMAL LOW (ref 7.350–7.450)
pO2, Arterial: 102 mmHg — ABNORMAL HIGH (ref 80.0–100.0)
pO2, Arterial: 60.3 mmHg — ABNORMAL LOW (ref 80.0–100.0)

## 2012-11-03 LAB — BASIC METABOLIC PANEL
BUN: 36 mg/dL — ABNORMAL HIGH (ref 6–23)
Calcium: 8.6 mg/dL (ref 8.4–10.5)
Creatinine, Ser: 1.01 mg/dL (ref 0.50–1.35)
GFR calc Af Amer: 88 mL/min — ABNORMAL LOW (ref >90)
GFR calc non Af Amer: 76 mL/min — ABNORMAL LOW (ref >90)

## 2012-11-03 LAB — CBC
HCT: 33.2 % — ABNORMAL LOW (ref 39.0–52.0)
MCHC: 33.7 g/dL (ref 30.0–36.0)
MCV: 85.3 fL (ref 78.0–100.0)
Platelets: 134 10*3/uL — ABNORMAL LOW (ref 150–400)
RDW: 15.4 % (ref 11.5–15.5)

## 2012-11-03 LAB — GLUCOSE, CAPILLARY
Glucose-Capillary: 201 mg/dL — ABNORMAL HIGH (ref 70–99)
Glucose-Capillary: 126 mg/dL — ABNORMAL HIGH (ref 70–99)
Glucose-Capillary: 179 mg/dL — ABNORMAL HIGH (ref 70–99)
Glucose-Capillary: 87 mg/dL (ref 70–99)

## 2012-11-03 MED ORDER — SODIUM CHLORIDE 0.9 % IV SOLN
1250.0000 mg | Freq: Two times a day (BID) | INTRAVENOUS | Status: DC
  Administered 2012-11-03 – 2012-11-05 (×5): 1250 mg via INTRAVENOUS
  Filled 2012-11-03 (×6): qty 1250

## 2012-11-03 MED ORDER — PROPRANOLOL HCL 20 MG/5ML PO SOLN
20.0000 mg | Freq: Three times a day (TID) | ORAL | Status: DC
  Administered 2012-11-03 – 2012-11-05 (×4): 20 mg via ORAL
  Filled 2012-11-03 (×9): qty 5

## 2012-11-03 MED ORDER — LEVOFLOXACIN IN D5W 500 MG/100ML IV SOLN
500.0000 mg | INTRAVENOUS | Status: DC
  Administered 2012-11-03 – 2012-11-05 (×3): 500 mg via INTRAVENOUS
  Filled 2012-11-03 (×3): qty 100

## 2012-11-03 MED ORDER — PIVOT 1.5 CAL PO LIQD
1000.0000 mL | ORAL | Status: DC
  Administered 2012-11-03 – 2012-11-05 (×2): 1000 mL
  Filled 2012-11-03 (×2): qty 1000

## 2012-11-03 NOTE — Progress Notes (Signed)
Patient ID: Alex Foster, male   DOB: 1946/08/02, 67 y.o.   MRN: [id number] Minimal eye opening on today's exam. Not following commands. Clinically doing poorly.we'll plan to repeat CT scan in the a.m.

## 2012-11-03 NOTE — Progress Notes (Signed)
ANTIBIOTIC CONSULT NOTE - INITIAL  Pharmacy Consult for vanc Indication: pneumonia  Allergies  Allergen Reactions  . Penicillins     Patient Measurements: Height: 5\' 9"  (175.3 cm) Weight: 205 lb 4 oz (93.1 kg) IBW/kg (Calculated) : 70.7   Vital Signs: Temp: 100.2 F (37.9 C) (02/02 0900) BP: 135/60 mmHg (02/02 0900) Pulse Rate: 71  (02/02 0900) Intake/Output from previous day: 02/01 0701 - 02/02 0700 In: 2540 [I.V.:2200; NG/GT:340] Out: 1470 [Urine:1470] Intake/Output from this shift: Total I/O In: 200 [I.V.:200] Out: 140 [Urine:140]  Labs:  Basename 11/03/12 0520 11/02/12 0530 11/01/12 1108 11/01/12 1051  WBC 11.4* 8.6 -- 9.3  HGB 11.2* 11.5* 13.3 --  PLT 134* 157 -- 173  LABCREA -- -- -- --  CREATININE 1.01 1.13 1.30 --   Estimated Creatinine Clearance: 82.2 ml/min (by C-G formula based on Cr of 1.01). No results found for this basename: VANCOTROUGH:2,VANCOPEAK:2,VANCORANDOM:2,GENTTROUGH:2,GENTPEAK:2,GENTRANDOM:2,TOBRATROUGH:2,TOBRAPEAK:2,TOBRARND:2,AMIKACINPEAK:2,AMIKACINTROU:2,AMIKACIN:2, in the last 72 hours   Microbiology: Recent Results (from the past 720 hour(s))  MRSA PCR SCREENING     Status: Abnormal   Collection Time   11/01/12  3:19 PM      Component Value Range Status Comment   MRSA by PCR POSITIVE (*) NEGATIVE Final     Medical History: Past Medical History  Diagnosis Date  . Stroke 01/2004    Intracebral hemorrhage in the setting of HTN  . Hyperlipidemia   . COPD (chronic obstructive pulmonary disease)     Active smoker, has oxygen for nightime use.  Cannot tolerate using mask.  . Low back pain     Go[facility] to [facility]  . H/O alcohol abuse   . Myocardial infarction   . Hypertension   . Diabetes mellitus   . 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 Extensive facial fractures 05/31/2012  . MRSA (methicillin resistant staphylococcus aureus) pneumonia 06/02/2012    Medications:  Scheduled:    . antiseptic oral rinse  15 mL Mouth  Rinse QID  . chlorhexidine  15 mL Mouth Rinse BID  . Chlorhexidine Gluconate Cloth  6 each Topical Q0600  . feeding supplement  60 mL Per Tube QID  . insulin aspart  0-20 Units Subcutaneous Q4H  . [COMPLETED] insulin glargine  5 Units Subcutaneous Daily  . levofloxacin (LEVAQUIN) IV  500 mg Intravenous Q24H  . multivitamin  5 mL Oral Daily  . mupirocin ointment  1 application Nasal BID  . pantoprazole sodium  40 mg Per Tube Daily  . propranolol  20 mg Oral TID  . selenium  200 mcg Per Tube Daily  . vitamin C  1,000 mg Per Tube Q8H  . [DISCONTINUED] pantoprazole  40 mg Oral Daily  . [DISCONTINUED] pantoprazole (PROTONIX) IV  40 mg Intravenous Daily   Assessment: 67 yo who was admitted for SDH after a fall. Now with new fever. Empiric vanc/levaquin added to r/o PNA.   Goal of Therapy:  Vancomycin trough level 15-20 mcg/ml  Plan:  Vanc 1.25g IV q12 F/u with trough if needed  Ulyses Southward [place] 11/03/2012,10:16 AM

## 2012-11-03 NOTE — Progress Notes (Addendum)
Patient ID: Alex Foster, male   DOB: [birth year], 67 y.o.   MRN: [id number] Follow up - Trauma Critical Care  Patient Details:    Alex Foster is an 67 y.o. male.  Lines/tubes : Airway 7 mm (Active)  Secured at (cm) 24 cm 11/03/2012  8:40 AM  Measured From Lips 11/03/2012  8:40 AM  Secured Location Left 11/03/2012  8:40 AM  Secured By [employer] 11/03/2012  8:40 AM  Tube Holder Repositioned Yes 11/03/2012  8:40 AM  Cuff Pressure (cm H2O) 24 cm H2O 11/03/2012  3:39 AM  Site Condition Dry 11/03/2012  8:40 AM     NG/OG Tube Orogastric 14 Fr. Center mouth (Active)  Placement Verification Auscultation 11/03/2012  8:00 AM  Site Assessment Clean;Dry;Intact 11/03/2012  8:00 AM  Status Clamped 11/03/2012  8:00 AM  Gastric Residual 128 mL 11/02/2012  4:00 PM  Intake (mL) 120 mL 11/02/2012 10:00 PM     Urethral Catheter (Active)  Indication for Insertion or Continuance of Catheter Urinary output monitoring;Prolonged immobilization 11/03/2012  8:00 AM  Site Assessment Clean;Intact 11/03/2012  8:00 AM  Collection Container Standard drainage bag 11/03/2012  8:00 AM  Securement Method Leg strap 11/03/2012  8:00 AM  Urinary Catheter Interventions Unclamped 11/03/2012  8:00 AM    Microbiology/Sepsis markers: Results for orders placed during the hospital encounter of 11/01/12  MRSA PCR SCREENING     Status: Abnormal   Collection Time   11/01/12  3:19 PM      Component Value Range Status Comment   MRSA by PCR POSITIVE (*) NEGATIVE Final     Anti-infectives:  Anti-infectives    None      Best Practice/Protocols:  VTE Prophylaxis: Mechanical Continous Sedation  Consults:      Studies:    Events:  Subjective:    Overnight Issues: very thick secretions  Objective:  Vital signs for last 24 hours: Temp:  [100 F (37.8 C)-100.9 F (38.3 C)] 100.2 F (37.9 C) (02/02 0900) Pulse Rate:  [63-92] 71  (02/02 0900) Resp:  [11-23] 17  (02/02 0900) BP: (125-168)/(55-88) 135/60 mmHg (02/02  0900) SpO2:  [92 %-100 %] 96 % (02/02 0900) FiO2 (%):  [30 %-60 %] 50 % (02/02 0840)  Hemodynamic parameters for last 24 hours:    Intake/Output from previous day: 02/01 0701 - 02/02 0700 In: 2540 [I.V.:2200; NG/GT:340] Out: 1470 [Urine:1470]  Intake/Output this shift: Total I/O In: 200 [I.V.:200] Out: 140 [Urine:140]  Vent settings for last 24 hours: Vent Mode:  [-] PRVC FiO2 (%):  [30 %-60 %] 50 % Set Rate:  [14 bmp-16 bmp] 16 bmp Vt Set:  [560 mL] 560 mL PEEP:  [5 cmH20] 5 cmH20 Pressure Support:  [10 cmH20] 10 cmH20 Plateau Pressure:  [14 cmH20-17 cmH20] 14 cmH20  Physical Exam:  General: on vent Neuro: sedated, does not F/C, pupils 2mm reactive HEENT/Neck: ETT Resp: few rhonchi CVS: reg 60s GI: soft, NT, ND, large UH reduces easily, +BS  Results for orders placed during the hospital encounter of 11/01/12 (from the past 24 hour(s))  GLUCOSE, CAPILLARY     Status: Abnormal   Collection Time   11/02/12 12:22 PM      Component Value Range   Glucose-Capillary 219 (*) 70 - 99 mg/dL  GLUCOSE, CAPILLARY     Status: Abnormal   Collection Time   11/02/12  3:28 PM      Component Value Range   Glucose-Capillary 157 (*) 70 - 99 mg/dL  GLUCOSE, CAPILLARY  Status: Abnormal   Collection Time   11/02/12  8:01 PM      Component Value Range   Glucose-Capillary 184 (*) 70 - 99 mg/dL  GLUCOSE, CAPILLARY     Status: Abnormal   Collection Time   11/02/12 11:30 PM      Component Value Range   Glucose-Capillary 201 (*) 70 - 99 mg/dL  BLOOD GAS, ARTERIAL     Status: Abnormal   Collection Time   11/03/12  3:45 AM      Component Value Range   FIO2 0.60     Delivery systems VENTILATOR     Mode PRESSURE REGULATED VOLUME CONTROL     VT 560.0     Rate 14.0     Peep/cpap 5.0     pH, Arterial 7.325 (*) 7.350 - 7.450   pCO2 arterial 58.6 (*) 35.0 - 45.0 mmHg   pO2, Arterial 102.0 (*) 80.0 - 100.0 mmHg   Bicarbonate 27.3 (*) 20.0 - 24.0 mEq/L   TCO2 28.6  0 - 100 mmol/L   Acid-Base  Excess 3.1 (*) 0.0 - 2.0 mmol/L   O2 Saturation 94.5     Patient temperature 110.4     Collection site LEFT RADIAL     Drawn by [id number]     Sample type ARTERIAL DRAW     Allens test (pass/fail) PASS  PASS  GLUCOSE, CAPILLARY     Status: Abnormal   Collection Time   11/03/12  3:54 AM      Component Value Range   Glucose-Capillary 181 (*) 70 - 99 mg/dL  BLOOD GAS, ARTERIAL     Status: Abnormal   Collection Time   11/03/12  5:16 AM      Component Value Range   FIO2 0.50     Delivery systems VENTILATOR     Mode PRESSURE REGULATED VOLUME CONTROL     VT 560.0     Rate 16.0     Peep/cpap 5.0     pH, Arterial 7.408  7.350 - 7.450   pCO2 arterial 44.4  35.0 - 45.0 mmHg   pO2, Arterial 60.3 (*) 80.0 - 100.0 mmHg   Bicarbonate 27.1 (*) 20.0 - 24.0 mEq/L   TCO2 28.4  0 - 100 mmol/L   Acid-Base Excess 3.0 (*) 0.0 - 2.0 mmol/L   O2 Saturation 89.8     Patient temperature 100.4     Collection site LEFT RADIAL     Drawn by [id number]     Sample type ARTERIAL DRAW     Allens test (pass/fail) PASS  PASS  CBC     Status: Abnormal   Collection Time   11/03/12  5:20 AM      Component Value Range   WBC 11.4 (*) 4.0 - 10.5 K/uL   RBC 3.89 (*) 4.22 - 5.81 MIL/uL   Hemoglobin 11.2 (*) 13.0 - 17.0 g/dL   HCT 16.1 (*) 09.6 - 04.5 %   MCV 85.3  78.0 - 100.0 fL   MCH 28.8  26.0 - 34.0 pg   MCHC 33.7  30.0 - 36.0 g/dL   RDW 40.9  81.1 - 91.4 %   Platelets 134 (*) 150 - 400 K/uL  BASIC METABOLIC PANEL     Status: Abnormal   Collection Time   11/03/12  5:20 AM      Component Value Range   Sodium 135  135 - 145 mEq/L   Potassium 3.7  3.5 - 5.1 mEq/L   Chloride 99  96 -  112 mEq/L   CO2 27  19 - 32 mEq/L   Glucose, Bld 167 (*) 70 - 99 mg/dL   BUN 36 (*) 6 - 23 mg/dL   Creatinine, Ser 4.09  0.50 - 1.35 mg/dL   Calcium 8.6  8.4 - 81.1 mg/dL   GFR calc non Af Amer 76 (*) >90 mL/min   GFR calc Af Amer 88 (*) >90 mL/min  PROTIME-INR     Status: Normal   Collection Time   11/03/12  5:20 AM      Component  Value Range   Prothrombin Time 14.3  11.6 - 15.2 seconds   INR 1.13  0.00 - 1.49    Assessment & Plan: Present on Admission:  **None**   LOS: 2 days   Additional comments:I reviewed the patients new imaging test results. and labs Fall TBI with falcine SDH/small B SDH - F/U CT stable, Dr, Danielle Dess following and I D/W him at the bedside, patient not doing much neurologically VTE/Hx DVT - holding anticoagulation, PAS DM - SSI ID - fever and sputum production, Cx sent, Vanc and Levaquin (PCN allergy) empiric VDRF - improved PCO2 with vent changes, full support today, ABD AM FEN - try to restart TF, held for residuals HTN - trying to wean off nipride, try to resume meds Critical Care Total Time*: 35 Minutes  Violeta Gelinas, MD, MPH, FACS Pager: [phone]  11/03/2012  *Care during the described time interval was provided by me and/or other providers on the critical care team.  I have reviewed this patient's available data, including medical history, events of note, physical examination and test results as part of my evaluation.

## 2012-11-04 ENCOUNTER — Inpatient Hospital Stay (HOSPITAL_COMMUNITY): Payer: Medicare Other

## 2012-11-04 ENCOUNTER — Encounter (HOSPITAL_COMMUNITY)
Admission: EM | Disposition: A | Discharge status: Nursing Home (Includes ICF) | Payer: Self-pay | Source: Home / Self Care

## 2012-11-04 DIAGNOSIS — S2242XA Multiple fractures of ribs, left side, initial encounter for closed fracture: Secondary | ICD-10-CM

## 2012-11-04 DIAGNOSIS — W19XXXA Unspecified fall, initial encounter: Secondary | ICD-10-CM | POA: Diagnosis present

## 2012-11-04 HISTORY — DX: Multiple fractures of ribs, left side, initial encounter for closed fracture: S22.42XA

## 2012-11-04 HISTORY — DX: Unspecified fall, initial encounter: W19.XXXA

## 2012-11-04 HISTORY — PX: PERCUTANEOUS TRACHEOSTOMY: SHX5288

## 2012-11-04 LAB — BLOOD GAS, ARTERIAL
Drawn by: 331001
MECHVT: 560 mL
PEEP: 5 cmH2O
Patient temperature: 98.6
TCO2: 27.3 mmol/L (ref 0–100)
pCO2 arterial: 43.9 mmHg (ref 35.0–45.0)
pH, Arterial: 7.389 (ref 7.350–7.450)

## 2012-11-04 LAB — CBC
HCT: 30 % — ABNORMAL LOW (ref 39.0–52.0)
Hemoglobin: 9.8 g/dL — ABNORMAL LOW (ref 13.0–17.0)
MCHC: 32.7 g/dL (ref 30.0–36.0)
MCV: 87.2 fL (ref 78.0–100.0)
RDW: 15.6 % — ABNORMAL HIGH (ref 11.5–15.5)
WBC: 7.7 10*3/uL (ref 4.0–10.5)

## 2012-11-04 LAB — GLUCOSE, CAPILLARY

## 2012-11-04 LAB — BASIC METABOLIC PANEL
BUN: 38 mg/dL — ABNORMAL HIGH (ref 6–23)
Chloride: 104 mEq/L (ref 96–112)
Creatinine, Ser: 1.08 mg/dL (ref 0.50–1.35)
GFR calc Af Amer: 81 mL/min — ABNORMAL LOW (ref >90)
Glucose, Bld: 170 mg/dL — ABNORMAL HIGH (ref 70–99)
Potassium: 4.2 mEq/L (ref 3.5–5.1)

## 2012-11-04 SURGERY — CREATION, TRACHEOSTOMY, PERCUTANEOUS
Anesthesia: LOCAL | Site: Neck | Wound class: Clean Contaminated

## 2012-11-04 SURGERY — INSERTION, PEG TUBE
Anesthesia: Moderate Sedation | Laterality: Bilateral

## 2012-11-04 MED ORDER — LIDOCAINE-EPINEPHRINE (PF) 1 %-1:200000 IJ SOLN
INTRAMUSCULAR | Status: DC | PRN
  Administered 2012-11-04: 3 mL

## 2012-11-04 MED ORDER — VECURONIUM BROMIDE 10 MG IV SOLR
INTRAVENOUS | Status: AC
  Administered 2012-11-04: 10 mg
  Filled 2012-11-04: qty 10

## 2012-11-04 MED ORDER — VECURONIUM BROMIDE 10 MG IV SOLR: 10.0000 mg | Freq: Once | INTRAVENOUS | Status: DC

## 2012-11-04 MED ORDER — SODIUM CHLORIDE 0.9 % IR SOLN
Status: DC | PRN
  Administered 2012-11-04: 60 mL

## 2012-11-04 SURGICAL SUPPLY — 23 items
DRAPE PROXIMA HALF (DRAPES) ×6 IMPLANT
DRAPE UTILITY 15X26 W/TAPE STR (DRAPE) ×6 IMPLANT
ELECT CAUTERY BLADE 6.4 (BLADE) ×3 IMPLANT
ELECT REM PT RETURN 9FT ADLT (ELECTROSURGICAL) ×3
ELECTRODE REM PT RTRN 9FT ADLT (ELECTROSURGICAL) ×2 IMPLANT
GAUZE SPONGE 4X4 16PLY XRAY LF (GAUZE/BANDAGES/DRESSINGS) ×3 IMPLANT
GLOVE BIO SURGEON STRL SZ8 (GLOVE) ×3 IMPLANT
GLOVE BIOGEL PI IND STRL 8 (GLOVE) ×2 IMPLANT
GLOVE BIOGEL PI INDICATOR 8 (GLOVE) ×1
GLOVE ECLIPSE 7.5 STRL STRAW (GLOVE) ×5 IMPLANT
GOWN PREVENTION PLUS XLARGE (GOWN DISPOSABLE) ×3 IMPLANT
GOWN STRL NON-REIN LRG LVL3 (GOWN DISPOSABLE) ×6 IMPLANT
INTRODUCER TRACH BLUE RHINO 6F (TUBING) ×2 IMPLANT
INTRODUCER TRACH BLUE RHINO 8F (TUBING) ×2 IMPLANT
NS IRRIG 1000ML POUR BTL (IV SOLUTION) ×1 IMPLANT
PENCIL BUTTON HOLSTER BLD 10FT (ELECTRODE) ×3 IMPLANT
SPONGE DRAIN TRACH 4X4 STRL 2S (GAUZE/BANDAGES/DRESSINGS) ×2 IMPLANT
SPONGE INTESTINAL PEANUT (DISPOSABLE) ×3 IMPLANT
SUT SILK 2 0 SH CR/8 (SUTURE) ×3 IMPLANT
SUT VICRYL AB 3 0 TIES (SUTURE) ×3 IMPLANT
TOWEL OR 17X26 10 PK STRL BLUE (TOWEL DISPOSABLE) ×3 IMPLANT
TUBE CONNECTING 12X1/4 (SUCTIONS) ×3 IMPLANT
YANKAUER SUCT BULB TIP NO VENT (SUCTIONS) ×3 IMPLANT

## 2012-11-04 NOTE — Procedures (Signed)
Bedside Tracheostomy Insertion Procedure Note   Patient Details:   Name: Alex Foster DOB: [birth year] MRN: [id number]  Procedure: Tracheostomy  Pre Procedure Assessment: Bite block in place: Yes Breath Sounds: Rhonch  Post Procedure Assessment: BP 151/85  Pulse 62  Temp 98.6 F (37 C) (Core (Comment))  Resp 25  Ht 5\' 9"  (1.753 m)  Wt 205 lb 14.6 oz (93.4 kg)  BMI 30.41 kg/m2  SpO2 95% O2 sats: stable throughout Complications: No apparent complications Patient did tolerate procedure well Tracheostomy Brand:Shiley Tracheostomy Style:Cuffed Tracheostomy Size: 8.0 Tracheostomy Secured WUJ:[id number] Tracheostomy Placement Confirmation:Trach cuff visualized and in place    Leonard Downing 11/04/2012, 2:34 PM

## 2012-11-04 NOTE — Progress Notes (Signed)
Orthopedic Tech Progress Note Patient Details:  Alex Foster 1946/06/05 [id number]  Ortho Devices Type of Ortho Device: Abdominal binder Ortho Device/Splint Interventions: Application   Cammer, Mickie Bail 11/04/2012, 4:35 PM

## 2012-11-04 NOTE — Progress Notes (Signed)
Pt placed back on full support by MD at this time due to pt getting trach. RT will monitor.

## 2012-11-04 NOTE — Op Note (Signed)
OPERATIVE REPORT  DATE OF OPERATION: 11/04/2012  PATIENT:  [name]  66 y.o. male  PRE-OPERATIVE DIAGNOSIS:  Prolonged intubation  POST-OPERATIVE DIAGNOSIS: Severe TBI  PROCEDURE:  Procedure(s): PERCUTANEOUS TRACHEOSTOMY PERCUTANEOUS ENDOSCOPIC GASTROSTOMY (PEG) PLACEMENT  SURGEON:  Surgeon(s): Cherylynn Ridges, MD  ASSISTANTLeotis Shames, PA-C  ANESTHESIA:   IV sedation  EBL: <50 ml  BLOOD ADMINISTERED: none  DRAINS: New #8 Shiley   SPECIMEN:  No Specimen  COUNTS CORRECT:  YES  PROCEDURE DETAILS: This procedure was done at the patient's bedside and [facility]100 neuro intensive care unit.  After proper time out was performed identifying the patient and the procedures to be performed, we prepared the patient for the initial procedure which was a percutaneous endoscopic gastrostomy tube placement. The abdominal wall was prepped by the assistant. A small Pentax endoscope was passed through the patient's oropharynx into the esophagus, stomach, and then subsequently through the pylorus into the duodenum. There was no abnormal pathology noted.  The impulse from the assistance of finger on the intra-abdominal wall was noted. A large angiocatheter was passed into the stomach under direct vision. A looped the blue wire was passed through the Angiocath. The wire was snared with a snare passed through the endoscope. The blue wire was brought out through the patient's mouth.  We looped the blue wire with the pull-through gastrostomy tube back through the patient's mouth through the esophagus and into the stomach. We followed the gastrostomy tube through the esophagus into the stomach up to its final resting place on the anterior abdominal wall. Pictures were taken of its placement on the inner portion of the anterior gastric wall.  It was secured in place in usual sterile manner.  We subsequently prepped and draped the patient for the percutaneous tracheostomy tube. A separate time out was  performed identifying the patient and the procedure to be performed.  We anesthetized the patient with a 25-gauge needle and 1% Xylocaine blade and taken down to the pretracheal fascia. The patient had a previous tracheostomy tube and therefore there was minimal fatty tissue in place. We dissected down to the tracheal fascia. We palpated the trachea as the endotracheal tube was pulled back just proximal to the surgeon's finger. We then pierced the tracheal cartilage with a large needle through which a green wire was passed into the tracheal lumen. Intraluminal placement was confirmed by aspiration of air as we entered the trachea. Once wire was in place a short stubby blue dilator was passed over the wire into the trachea. Subsequently a large tapering Blue Rhino dilator was passed over the wire into the tracheotomy. This enlarged to proper size.  We initially tried to pass a 24 [place] introducer and dilator with a #6 Shiley tracheostomy tube into the tracheotomy. Once it was in place however it was noted that the patient had a significant air leak likely because of the size of his trachea and the small size of the tracheostomy tube. The we therefore exchanged the #6 Shiley tracheostomy tube for #8 Shiley tracheostomy tube over the guidewire and the guide.  Once this was done the air leak was no longer present. We bronchoscoped the patient through the endotracheal tube and the tracheostomy showing good intratracheal placement of the tube. Once this was done we removed the endotracheal tube and secured the tracheostomy tube in place with 4 corner stitches of 3-0 nylon. A drain sponge was placed underneath the flanges of the tracheostomy tube. All counts were correct.  PATIENT DISPOSITION:  New trach in ICU 3100   Cherylynn Ridges 2/3/20141:40 PM

## 2012-11-04 NOTE — Clinical Social Work Note (Signed)
Patient is known to this CSW from a previous trauma admission.  Patient is from [facility] where the fall occurred.   Patient is now trached with plans to discharge to [facility] 11/05/2012 due to ventilator support and inability to return to [place] at this time.  SBIRT not appropriate for patient due to current medical status.  Clinical Social Worker will sign off for now as social work intervention is no longer needed. Please consult [place] again if new need arises.  Macario Golds, [place] [phone]

## 2012-11-04 NOTE — Progress Notes (Signed)
Follow up - Trauma and Critical Care  Patient Details:    Alex Foster is an 67 y.o. male.  Lines/tubes : Airway 7 mm (Active)  Secured at (cm) 24 cm 11/04/2012  3:31 AM  Measured From Lips 11/04/2012  3:31 AM  Secured Location Center 11/04/2012  3:31 AM  Secured By [employer] 11/04/2012  4:18 AM  Tube Holder Repositioned Yes 11/04/2012  3:31 AM  Cuff Pressure (cm H2O) 26 cm H2O 11/04/2012  3:31 AM  Site Condition Dry 11/04/2012  3:31 AM     NG/OG Tube Orogastric 14 Fr. Center mouth (Active)  Placement Verification Auscultation 11/03/2012  8:00 PM  Site Assessment Clean;Dry;Intact 11/03/2012  8:00 PM  Status Infusing tube feed 11/03/2012  8:00 PM  Drainage Appearance Other (Comment) 11/03/2012  8:00 PM  Gastric Residual 3 mL 11/03/2012  8:00 PM  Intake (mL) 15 mL 11/04/2012  7:00 AM  Output (mL) 0 mL 11/03/2012  6:00 PM     Urethral Catheter (Active)  Indication for Insertion or Continuance of Catheter Urinary output monitoring;Prolonged immobilization 11/03/2012  8:00 PM  Site Assessment Clean;Intact 11/03/2012  8:00 PM  Collection Container Standard drainage bag 11/03/2012  8:00 PM  Securement Method Leg strap 11/03/2012  8:00 PM  Urinary Catheter Interventions Unclamped 11/03/2012  8:00 AM    Microbiology/Sepsis markers: Results for orders placed during the hospital encounter of 11/01/12  MRSA PCR SCREENING     Status: Abnormal   Collection Time   11/01/12  3:19 PM      Component Value Range Status Comment   MRSA by PCR POSITIVE (*) NEGATIVE Final     Anti-infectives:  Anti-infectives     Start     Dose/Rate Route Frequency Ordered Stop   11/03/12 1100   levofloxacin (LEVAQUIN) IVPB 500 mg        500 mg 100 mL/hr over 60 Minutes Intravenous Every 24 hours 11/03/12 1008     11/03/12 1100   vancomycin (VANCOCIN) 1,250 mg in sodium chloride 0.9 % 250 mL IVPB        1,250 mg 166.7 mL/hr over 90 Minutes Intravenous Every 12 hours 11/03/12 1019            Best Practice/Protocols:  VTE  Prophylaxis: Mechanical GI Prophylaxis: Proton Pump Inhibitor Continous Sedation  Consults:      Events:  Subjective:    Overnight Issues: Secretions. Has not been able to wean.  Objective:  Vital signs for last 24 hours: Temp:  [99.5 F (37.5 C)-100.6 F (38.1 C)] 99.5 F (37.5 C) (02/03 0800) Pulse Rate:  [57-74] 58  (02/03 0800) Resp:  [16-18] 16  (02/03 0800) BP: (123-166)/(47-76) 166/72 mmHg (02/03 0800) SpO2:  [93 %-100 %] 100 % (02/03 0800) FiO2 (%):  [40 %-50 %] 40 % (02/03 0331) Weight:  [93.4 kg (205 lb 14.6 oz)] 93.4 kg (205 lb 14.6 oz) (02/03 0331)  Hemodynamic parameters for last 24 hours:    Intake/Output from previous day: 02/02 0701 - 02/03 0700 In: 3178 [I.V.:2423; NG/GT:505; IV Piggyback:250] Out: 1375 [Urine:1365; Emesis/NG output:10]  Intake/Output this shift:    Vent settings for last 24 hours: Vent Mode:  [-] PRVC FiO2 (%):  [40 %-50 %] 40 % Set Rate:  [16 bmp] 16 bmp Vt Set:  [560 mL] 560 mL PEEP:  [5 cmH20] 5 cmH20 Plateau Pressure:  [14 cmH20-18 cmH20] 18 cmH20  Physical Exam:  General: no respiratory distress and Peak airway pressures are high. Neuro: RASS -3 or deeper  Resp: rhonchi bilaterally Extremities: no edema, no erythema, pulses WNL and No DVT signs or symptoms.  Patient has history of DVT  Results for orders placed during the hospital encounter of 11/01/12 (from the past 24 hour(s))  GLUCOSE, CAPILLARY     Status: Abnormal   Collection Time   11/03/12 12:16 PM      Component Value Range   Glucose-Capillary 126 (*) 70 - 99 mg/dL  GLUCOSE, CAPILLARY     Status: Abnormal   Collection Time   11/03/12  4:10 PM      Component Value Range   Glucose-Capillary 172 (*) 70 - 99 mg/dL   Comment 1 Notify RN    GLUCOSE, CAPILLARY     Status: Normal   Collection Time   11/03/12  7:34 PM      Component Value Range   Glucose-Capillary 87  70 - 99 mg/dL  GLUCOSE, CAPILLARY     Status: Abnormal   Collection Time   11/03/12 11:35 PM       Component Value Range   Glucose-Capillary 121 (*) 70 - 99 mg/dL  BLOOD GAS, ARTERIAL     Status: Abnormal   Collection Time   11/04/12  3:34 AM      Component Value Range   FIO2 0.40     Delivery systems VENTILATOR     Mode PRESSURE REGULATED VOLUME CONTROL     VT 560     Rate 16     Peep/cpap 5.0     pH, Arterial 7.389  7.350 - 7.450   pCO2 arterial 43.9  35.0 - 45.0 mmHg   pO2, Arterial 67.0 (*) 80.0 - 100.0 mmHg   Bicarbonate 25.9 (*) 20.0 - 24.0 mEq/L   TCO2 27.3  0 - 100 mmol/L   Acid-Base Excess 1.5  0.0 - 2.0 mmol/L   O2 Saturation 94.2     Patient temperature 98.6     Collection site LEFT RADIAL     Drawn by [id number]     Sample type ARTERIAL DRAW     Allens test (pass/fail) PASS  PASS  CBC     Status: Abnormal   Collection Time   11/04/12  5:30 AM      Component Value Range   WBC 7.7  4.0 - 10.5 K/uL   RBC 3.44 (*) 4.22 - 5.81 MIL/uL   Hemoglobin 9.8 (*) 13.0 - 17.0 g/dL   HCT 16.1 (*) 09.6 - 04.5 %   MCV 87.2  78.0 - 100.0 fL   MCH 28.5  26.0 - 34.0 pg   MCHC 32.7  30.0 - 36.0 g/dL   RDW 40.9 (*) 81.1 - 91.4 %   Platelets 123 (*) 150 - 400 K/uL  BASIC METABOLIC PANEL     Status: Abnormal   Collection Time   11/04/12  5:30 AM      Component Value Range   Sodium 136  135 - 145 mEq/L   Potassium 4.2  3.5 - 5.1 mEq/L   Chloride 104  96 - 112 mEq/L   CO2 25  19 - 32 mEq/L   Glucose, Bld 170 (*) 70 - 99 mg/dL   BUN 38 (*) 6 - 23 mg/dL   Creatinine, Ser 7.82  0.50 - 1.35 mg/dL   Calcium 8.3 (*) 8.4 - 10.5 mg/dL   GFR calc non Af Amer 70 (*) >90 mL/min   GFR calc Af Amer 81 (*) >90 mL/min  TRIGLYCERIDES     Status: Normal   Collection  Time   11/04/12  5:50 AM      Component Value Range   Triglycerides 88  <150 mg/dL     Assessment/Plan:   NEURO  Altered Mental Status:  obtundation   Plan: Sedate until able to perform trach and G-tube  PULM  Atelectasis/collapse (bilateral bases)   Plan: Continue ventilator support, get BAL  CARDIO  No issues   Plan: CPM   RENAL  Urine output has been good   Plan: CPM  GI  Benign   Plan: Plan PEG later today if we can get consent.  ID  No known infections, but empirically palced on antibiotics yesterday and respiratory culture sent.   Plan: CPM  HEME  Anemia anemia of chronic disease and anemia of critical illness)   Plan: No blood  ENDO No specific issues   Plan: CPM  Global Issues  Neurologically the patient is not awakening as well as we would want to consider extubation.  Will probably go ahead and trach the patient today if we can find family for consent.  He has a left flank incision and a large umbilical hernia which could complicate the PEG.    LOS: 3 days   Additional comments:I reviewed the patient's new clinical lab test results. cbc/bmet and I reviewed the patients new imaging test results. cxr  Critical Care Total Time*: 33 minutes of time   Cherylynn Ridges 11/04/2012  *Care during the described time interval was provided by me and/or other providers on the critical care team.  I have reviewed this patient's available data, including medical history, events of note, physical examination and test results as part of my evaluation.

## 2012-11-04 NOTE — Progress Notes (Signed)
UR completed 

## 2012-11-04 NOTE — Progress Notes (Signed)
Patient ID: Alex Foster, male   DOB: 05-04-1946, 67 y.o.   MRN: [id number] CT shows stability of interhemispheric subdural hemorrhage. Small petechial hemorrhage over convexity on left side.  Exam demonstrates some eye opening. Patient has some purposeful movement of right upper extremity to deep central pain. Does not move for withdrawal left upper extremity.  Surgical intervention for this lesion will not help his clinical situation or improve his overall outcome.  Can continue conservative care, but at some point may need to discuss goals of realistic therapy with family. Patient had been significantly disabled prior to this hospitalization and a full recovery is not likely at all.

## 2012-11-04 NOTE — Progress Notes (Signed)
Spoke with Deatra Ina over the phone and received consent for trach and PEG placement. Verified with a second RN Bernadette Hoit. Notified Charma Igo.

## 2012-11-05 ENCOUNTER — Inpatient Hospital Stay (HOSPITAL_COMMUNITY): Payer: Medicare Other

## 2012-11-05 ENCOUNTER — Other Ambulatory Visit (HOSPITAL_COMMUNITY): Payer: Self-pay

## 2012-11-05 ENCOUNTER — Inpatient Hospital Stay
Admission: AD | Admit: 2012-11-05 | Discharge: 2012-11-29 | Disposition: A | Discharge status: Skilled Nursing Facility | Payer: Self-pay | Source: Ambulatory Visit | Attending: Internal Medicine | Admitting: Internal Medicine

## 2012-11-05 DIAGNOSIS — R601 Generalized edema: Secondary | ICD-10-CM

## 2012-11-05 DIAGNOSIS — J9601 Acute respiratory failure with hypoxia: Secondary | ICD-10-CM

## 2012-11-05 DIAGNOSIS — J90 Pleural effusion, not elsewhere classified: Secondary | ICD-10-CM

## 2012-11-05 DIAGNOSIS — Z930 Tracheostomy status: Secondary | ICD-10-CM

## 2012-11-05 DIAGNOSIS — H8110 Benign paroxysmal vertigo, unspecified ear: Secondary | ICD-10-CM

## 2012-11-05 DIAGNOSIS — Z7901 Long term (current) use of anticoagulants: Secondary | ICD-10-CM

## 2012-11-05 DIAGNOSIS — Z5181 Encounter for therapeutic drug level monitoring: Secondary | ICD-10-CM

## 2012-11-05 DIAGNOSIS — R5381 Other malaise: Secondary | ICD-10-CM

## 2012-11-05 DIAGNOSIS — I469 Cardiac arrest, cause unspecified: Secondary | ICD-10-CM

## 2012-11-05 DIAGNOSIS — S069X9A Unspecified intracranial injury with loss of consciousness of unspecified duration, initial encounter: Secondary | ICD-10-CM

## 2012-11-05 DIAGNOSIS — J209 Acute bronchitis, unspecified: Secondary | ICD-10-CM

## 2012-11-05 DIAGNOSIS — J449 Chronic obstructive pulmonary disease, unspecified: Secondary | ICD-10-CM

## 2012-11-05 LAB — BASIC METABOLIC PANEL
Calcium: 8.7 mg/dL (ref 8.4–10.5)
Chloride: 105 mEq/L (ref 96–112)

## 2012-11-05 LAB — CBC WITH DIFFERENTIAL/PLATELET
Basophils Absolute: 0 10*3/uL (ref 0.0–0.1)
Basophils Relative: 0 % (ref 0–1)
MCHC: 32.4 g/dL (ref 30.0–36.0)
Monocytes Absolute: 1 10*3/uL (ref 0.1–1.0)
Neutro Abs: 5.2 10*3/uL (ref 1.7–7.7)
Neutrophils Relative %: 71 % (ref 43–77)
RDW: 15.4 % (ref 11.5–15.5)

## 2012-11-05 LAB — BLOOD GAS, ARTERIAL
Drawn by: 331761
O2 Saturation: 91.9 %
PEEP: 5 cmH2O
Patient temperature: 99.7
RATE: 16 resp/min
pH, Arterial: 7.35 (ref 7.350–7.450)

## 2012-11-05 LAB — GLUCOSE, CAPILLARY
Glucose-Capillary: 141 mg/dL — ABNORMAL HIGH (ref 70–99)
Glucose-Capillary: 126 mg/dL — ABNORMAL HIGH (ref 70–99)

## 2012-11-05 MED ORDER — HYDROCODONE-ACETAMINOPHEN 7.5-325 MG/15ML PO SOLN: 10.0000 mL | ORAL | Status: DC | PRN

## 2012-11-05 MED ORDER — AMANTADINE HCL 100 MG PO CAPS: 100.0000 mg | ORAL_CAPSULE | Freq: Every day | ORAL | Status: DC

## 2012-11-05 MED ORDER — LEVOFLOXACIN IN D5W 500 MG/100ML IV SOLN: 500.0000 mg | INTRAVENOUS | Status: DC

## 2012-11-05 MED ORDER — VANCOMYCIN HCL 10 G IV SOLR: 1250.0000 mg | Freq: Two times a day (BID) | INTRAVENOUS | Status: DC

## 2012-11-05 MED ORDER — POTASSIUM CHLORIDE IN NACL 20-0.9 MEQ/L-% IV SOLN: 100.0000 mL/h | INTRAVENOUS | Status: DC

## 2012-11-05 MED ORDER — HYDRALAZINE HCL 20 MG/ML IJ SOLN
10.0000 mg | Freq: Four times a day (QID) | INTRAMUSCULAR | Status: DC | PRN
  Administered 2012-11-05 (×2): 10 mg via INTRAVENOUS
  Filled 2012-11-05 (×2): qty 1

## 2012-11-05 MED ORDER — QUETIAPINE FUMARATE 25 MG PO TABS: 25.0000 mg | ORAL_TABLET | Freq: Three times a day (TID) | ORAL | Status: DC

## 2012-11-05 MED ORDER — PANTOPRAZOLE SODIUM 40 MG PO PACK: 40.0000 mg | PACK | Freq: Every day | ORAL | Status: DC

## 2012-11-05 MED ORDER — INSULIN ASPART 100 UNIT/ML ~~LOC~~ SOLN: 0.0000 [IU] | SUBCUTANEOUS | Status: DC

## 2012-11-05 MED ORDER — CITALOPRAM HYDROBROMIDE 10 MG PO TABS: 10.0000 mg | ORAL_TABLET | Freq: Every day | ORAL | Status: DC

## 2012-11-05 MED ORDER — QUETIAPINE FUMARATE 25 MG PO TABS
25.0000 mg | ORAL_TABLET | Freq: Three times a day (TID) | ORAL | Status: DC
  Administered 2012-11-05 (×2): 25 mg
  Filled 2012-11-05 (×3): qty 1

## 2012-11-05 MED ORDER — PIVOT 1.5 CAL PO LIQD: 1000.0000 mL | ORAL | Status: DC

## 2012-11-05 NOTE — Discharge Summary (Signed)
Brittni Hult, MD, MPH, FACS Pager: [phone]  
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
## 2012-11-05 NOTE — Progress Notes (Signed)
Patient ID: Alex Foster, male   DOB: Feb 25, 1946, 67 y.o.   MRN: [id number] Patient has had tracheostomy and gastrostomy placed.  On current exam patient opens eyes, weakly moves right upper extremity. No functional movement on the left upper extremity. Patient is purposeful with right upper extremity. Pupils are equal round and reactive to light and accommodation.  Likely outcome is long-term placement. However patient's intracranial processes have been stable for 4 days time. This interhemispheric subdural hematoma will likely take months to resolve. Neuro status may take longer and will not likely fully resolve. Will remain available to consult on the patient however I will sign off at this time please recontact [place] if intervention is necessary.

## 2012-11-06 ENCOUNTER — Encounter (HOSPITAL_COMMUNITY): Payer: Self-pay | Admitting: General Surgery

## 2012-11-06 DIAGNOSIS — J9600 Acute respiratory failure, unspecified whether with hypoxia or hypercapnia: Secondary | ICD-10-CM

## 2012-11-06 DIAGNOSIS — J449 Chronic obstructive pulmonary disease, unspecified: Secondary | ICD-10-CM

## 2012-11-06 LAB — COMPREHENSIVE METABOLIC PANEL
Albumin: 2.9 g/dL — ABNORMAL LOW (ref 3.5–5.2)
Alkaline Phosphatase: 130 U/L — ABNORMAL HIGH (ref 39–117)
BUN: 19 mg/dL (ref 6–23)
Creatinine, Ser: 0.79 mg/dL (ref 0.50–1.35)
Potassium: 3.5 mEq/L (ref 3.5–5.1)
Total Protein: 6.8 g/dL (ref 6.0–8.3)

## 2012-11-06 LAB — TSH: TSH: 1.043 u[IU]/mL (ref 0.350–4.500)

## 2012-11-06 LAB — CBC
Hemoglobin: 11.9 g/dL — ABNORMAL LOW (ref 13.0–17.0)
RBC: 4.15 MIL/uL — ABNORMAL LOW (ref 4.22–5.81)

## 2012-11-06 LAB — PREALBUMIN: Prealbumin: 10.6 mg/dL — ABNORMAL LOW (ref 17.0–34.0)

## 2012-11-06 LAB — PROCALCITONIN: Procalcitonin: <0.1 ng/mL

## 2012-11-06 LAB — CULTURE, RESPIRATORY W GRAM STAIN: Special Requests: NORMAL
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
## 2012-11-07 LAB — PROCALCITONIN: Procalcitonin: <0.1 ng/mL

## 2012-11-07 LAB — VANCOMYCIN, TROUGH: Vancomycin Tr: 20.9 ug/mL — ABNORMAL HIGH (ref 10.0–20.0)

## 2012-11-08 DIAGNOSIS — S069X9A Unspecified intracranial injury with loss of consciousness of unspecified duration, initial encounter: Secondary | ICD-10-CM

## 2012-11-08 DIAGNOSIS — R5381 Other malaise: Secondary | ICD-10-CM

## 2012-11-08 NOTE — Progress Notes (Signed)
PULMONARY  / CRITICAL CARE MEDICINE  Name: Alex Foster MRN: [id number] DOB: 01/04/1946    ADMISSION DATE:  11/05/2012 CONSULTATION DATE:  2/5  REFERRING MD :  Hijazi   CHIEF COMPLAINT:  Vent weaning   BRIEF PATIENT DESCRIPTION:  67 yom fell at SNF, resulting in: both falcine and tentorial subdural hematomas and a few new left rib fractures. He was admitted by the trauma service to the neurologic ICU and neurosurgery was consulted.Neurosurgery felt that the patient's brain lesions were non-operable. Follow-up CT scans were stable. Neurosurgery was of the opinion that there was a good chance the patient would not recover well given his recent brain trauma. Because of the respiratory difficulty the patient had during his hospitalization 6 months ago and the fact that he was minimally responsive on the ventilator, so he underwent  tracheostomy and PEG tube placement. Arrives to [facility] for vent weaning.    SIGNIFICANT EVENTS / STUDIES:   LINES / TUBES: PERC trach and PEG 2/3>>>  CULTURES: Sputum 2/2: MRSA  Results for orders placed during the hospital encounter of 11/01/12  MRSA PCR SCREENING     Status: Abnormal   Collection Time   11/01/12  3:19 PM      Component Value Range Status Comment   MRSA by PCR POSITIVE (*) NEGATIVE Final   CULTURE, RESPIRATORY     Status: Normal   Collection Time   11/03/12  9:09 AM      Component Value Range Status Comment   Specimen Description TRACHEAL ASPIRATE   Final    Special Requests Normal   Final    Gram Stain     Final    Value: MODERATE WBC PRESENT, PREDOMINANTLY PMN     RARE SQUAMOUS EPITHELIAL CELLS PRESENT     MODERATE GRAM POSITIVE COCCI     IN PAIRS IN CLUSTERS   Culture     Final    Value: ABUNDANT METHICILLIN RESISTANT STAPHYLOCOCCUS AUREUS     Note: RIFAMPIN AND GENTAMICIN SHOULD NOT BE USED AS SINGLE DRUGS FOR TREATMENT OF STAPH INFECTIONS. CRITICAL RESULT CALLED TO, READ BACK BY AND VERIFIED WITH: MARION L@7 :41AM ON 11/06/12 BY DANTS   Report Status 11/06/2012 FINAL   Final    Organism ID, Bacteria METHICILLIN RESISTANT STAPHYLOCOCCUS AUREUS   Final      ANTIBIOTICS: Internal medicine   SUBJECTIVE:  11/08/2012 lot of respiratory secretions but is on 40% tracheostomy collar. Plan is to change from 8 to #6 trach [place]  VITAL SIGNS:   temperature 97.3. Pulse of 91. Respirator 20. Blood pressure 110/72. Pulse ox of 92%  PHYSICAL EXAMINATION: General:  Chronically ill appearing male, confused Neuro:  Awake, trys to speak. Left sided weakness. C collar in place  HEENT:  C collar in place. Trach mid-line  Cardiovascular:  rrr Lungs:  Scattered rhonchi  Abdomen:  PEG intact  Musculoskeletal:  Weak, left sided weakness, anasarca    Lab 11/06/12 0640 11/05/12 0435 11/04/12 0530  NA 135 139 136  K 3.5 4.0 4.2  CL 99 105 104  CO2 24 26 25   BUN 19 26* 38*  CREATININE 0.79 0.92 1.08  GLUCOSE 211* 142* 170*    Lab 11/06/12 0640 11/05/12 0435 11/04/12 0530  HGB 11.9* 9.9* 9.8*  HCT 34.8* 30.6* 30.0*  WBC 7.4 7.4 7.7  PLT 184 142* 123*   No results found.  ASSESSMENT / PLAN: 1) TBI w/ small non-op traumatic SDH 2) Trach dependent d/t #1 3) MRSA PNA 4) h/o CVA  5) DM w/ hyperglycemia  6) anemia  7) HTN 8) multiple rib fractures  9) DVT of right axillary vein Now off vent and looks comfortable on ATC. The decision to decannulate will depend on how much he improves.  rec Cont abx Cont routine trach care SLP eval when MS appropriate, otherwise strict NPO F/u CXR intermittently    Dr. Kalman Shan, M.D., [facility].C.P Pulmonary and Critical Care Medicine Staff Physician [place] [place] Pulmonary and Critical Care Pager: [phone], If no answer or between  15:00h - 7:00h: call [phone]  11/08/2012 11:28 AM

## 2012-11-09 LAB — VANCOMYCIN, TROUGH: Vancomycin Tr: 20.3 ug/mL — ABNORMAL HIGH (ref 10.0–20.0)

## 2012-11-09 LAB — PROCALCITONIN: Procalcitonin: <0.1 ng/mL

## 2012-11-11 DIAGNOSIS — Z930 Tracheostomy status: Secondary | ICD-10-CM

## 2012-11-11 DIAGNOSIS — J209 Acute bronchitis, unspecified: Secondary | ICD-10-CM

## 2012-11-11 DIAGNOSIS — J90 Pleural effusion, not elsewhere classified: Secondary | ICD-10-CM

## 2012-11-11 HISTORY — DX: Tracheostomy status: Z93.0

## 2012-11-11 LAB — BASIC METABOLIC PANEL
BUN: 24 mg/dL — ABNORMAL HIGH (ref 6–23)
CO2: 28 mEq/L (ref 19–32)
Calcium: 8.8 mg/dL (ref 8.4–10.5)
GFR calc non Af Amer: >90 mL/min (ref >90)
Glucose, Bld: 242 mg/dL — ABNORMAL HIGH (ref 70–99)
Potassium: 3.7 mEq/L (ref 3.5–5.1)
Sodium: 130 mEq/L — ABNORMAL LOW (ref 135–145)

## 2012-11-11 LAB — CBC
HCT: 33.7 % — ABNORMAL LOW (ref 39.0–52.0)
Hemoglobin: 11.6 g/dL — ABNORMAL LOW (ref 13.0–17.0)
MCH: 28.6 pg (ref 26.0–34.0)
MCHC: 34.4 g/dL (ref 30.0–36.0)
RBC: 4.05 MIL/uL — ABNORMAL LOW (ref 4.22–5.81)

## 2012-11-11 NOTE — Progress Notes (Signed)
PULMONARY  / CRITICAL CARE MEDICINE  Name: Alex Foster MRN: [id number] DOB: 06-08-1946    ADMISSION DATE:  11/05/2012 CONSULTATION DATE:  2/5  REFERRING MD :  Hijazi   CHIEF COMPLAINT:  Vent weaning   BRIEF PATIENT DESCRIPTION:  67 yom fell at SNF, resulting in: both falcine and tentorial subdural hematomas and a few new left rib fractures. He was admitted by the trauma service to the neurologic ICU and neurosurgery was consulted.Neurosurgery felt that the patient's brain lesions were non-operable. Follow-up CT scans were stable. Neurosurgery was of the opinion that there was a good chance the patient would not recover well given his recent brain trauma. Because of the respiratory difficulty the patient had during his hospitalization 6 months ago and the fact that he was minimally responsive on the ventilator, so he underwent  tracheostomy and PEG tube placement. Arrives to [facility] for vent weaning.    SIGNIFICANT EVENTS / STUDIES:   LINES / TUBES: PERC trach and PEG 2/3>>>  CULTURES: Sputum 2/2: MRSA  Results for orders placed during the hospital encounter of 11/01/12  MRSA PCR SCREENING     Status: Abnormal   Collection Time    11/01/12  3:19 PM      Result Value Range Status   MRSA by PCR POSITIVE (*) NEGATIVE Final   Comment:            The GeneXpert MRSA Assay (FDA     approved for NASAL specimens     only), is one component of a     comprehensive MRSA colonization     surveillance program. It is not     intended to diagnose MRSA     infection nor to guide or     monitor treatment for     MRSA infections.     RESULT CALLED TO, READ BACK BY AND VERIFIED WITH:     Festus Holts RN 16:50 11/01/12 ([email or web address])  CULTURE, RESPIRATORY     Status: None   Collection Time    11/03/12  9:09 AM      Result Value Range Status   Specimen Description TRACHEAL ASPIRATE   Final   Special Requests Normal   Final   Gram Stain     Final   Value: MODERATE WBC PRESENT, PREDOMINANTLY PMN   RARE SQUAMOUS EPITHELIAL CELLS PRESENT     MODERATE GRAM POSITIVE COCCI     IN PAIRS IN CLUSTERS   Culture     Final   Value: ABUNDANT METHICILLIN RESISTANT STAPHYLOCOCCUS AUREUS     Note: RIFAMPIN AND GENTAMICIN SHOULD NOT BE USED AS SINGLE DRUGS FOR TREATMENT OF STAPH INFECTIONS. CRITICAL RESULT CALLED TO, READ BACK BY AND VERIFIED WITH: MARION L@7 :41AM ON 11/06/12 BY DANTS   Report Status 11/06/2012 FINAL   Final   Organism ID, Bacteria METHICILLIN RESISTANT STAPHYLOCOCCUS AUREUS   Final     ANTIBIOTICS: Per Internal medicine   SUBJECTIVE: Tolerating ATC 24/7 28%.  C-Collar in place.  No distress.    VITAL SIGNS: Reviewed.    PHYSICAL EXAMINATION: General:  Chronically ill appearing male, confused Neuro:  Awake, trys to speak. Left sided weakness. C collar in place  HEENT:  C collar in place. #8 trach mid-line  Cardiovascular:  rrr Lungs:  Scattered rhonchi  Abdomen:  PEG intact  Musculoskeletal:  Weak, left sided weakness, anasarca    Recent Labs Lab 11/05/12 0435 11/06/12 0640 11/11/12 0728  NA 139 135 130*  K 4.0 3.5 3.7  CL 105  99 94*  CO2 26 24 28   BUN 26* 19 24*  CREATININE 0.92 0.79 0.78  GLUCOSE 142* 211* 242*    Recent Labs Lab 11/05/12 0435 11/06/12 0640 11/11/12 0728  HGB 9.9* 11.9* 11.6*  HCT 30.6* 34.8* 33.7*  WBC 7.4 7.4 5.5  PLT 142* 184 199   No results found.  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 The decision to decannulate will depend on how much he improves from neurological standpoint & physical mobility.  He remains in C-Collar.     Plan: Cont abx Cont routine trach care SLP eval when MS appropriate, otherwise strict NPO F/u CXR intermittently Will need to determine when c-collar can come off. He should not have a  trach change or consideration decannulation until out of collar.   PCCM will be available PRN.  Please contact us when the c-collar issues are sorted out and we will follow for potential decannulation.    Alex Brim, NP-C [facility] Pgr: [phone] or [phone]   11/11/2012 2:34 PM   Patient seen and examined, agree with above note.  I dictated the care and orders written for this patient under my direction.  Alyson Reedy, MD [phone]

## 2012-11-13 ENCOUNTER — Other Ambulatory Visit (HOSPITAL_COMMUNITY): Payer: Self-pay

## 2012-11-13 LAB — BASIC METABOLIC PANEL
BUN: 29 mg/dL — ABNORMAL HIGH (ref 6–23)
Chloride: 96 mEq/L (ref 96–112)
Creatinine, Ser: 0.86 mg/dL (ref 0.50–1.35)
Glucose, Bld: 148 mg/dL — ABNORMAL HIGH (ref 70–99)
Potassium: 3.6 mEq/L (ref 3.5–5.1)

## 2012-11-14 ENCOUNTER — Other Ambulatory Visit (HOSPITAL_COMMUNITY): Payer: Self-pay

## 2012-11-15 ENCOUNTER — Other Ambulatory Visit (HOSPITAL_COMMUNITY): Payer: Self-pay

## 2012-11-15 DIAGNOSIS — R609 Edema, unspecified: Secondary | ICD-10-CM

## 2012-11-15 DIAGNOSIS — I469 Cardiac arrest, cause unspecified: Secondary | ICD-10-CM

## 2012-11-15 MED ORDER — IOHEXOL 300 MG/ML  SOLN
30.0000 mL | Freq: Once | INTRAMUSCULAR | Status: AC | PRN
  Administered 2012-11-15: 30 mL

## 2012-11-15 NOTE — Progress Notes (Signed)
PULMONARY  / CRITICAL CARE MEDICINE  Name: Alex Foster MRN: [id number] DOB: Aug 22, 1946    ADMISSION DATE:  11/05/2012 CONSULTATION DATE:  2/5  REFERRING MD :  Hijazi   CHIEF COMPLAINT:  Vent weaning   BRIEF PATIENT DESCRIPTION:  67 yom fell at SNF, resulting in: both falcine and tentorial subdural hematomas and a few new left rib fractures. He was admitted by the trauma service to the neurologic ICU and neurosurgery was consulted.Neurosurgery felt that the patient's brain lesions were non-operable. Follow-up CT scans were stable. Neurosurgery was of the opinion that there was a good chance the patient would not recover well given his recent brain trauma. Because of the respiratory difficulty the patient had during his hospitalization 6 months ago and the fact that he was minimally responsive on the ventilator, so he underwent  tracheostomy and PEG tube placement. Arrives to [facility] for vent weaning.    SIGNIFICANT EVENTS / STUDIES:   LINES / TUBES: PERC trach and PEG 2/3>>>  CULTURES: Sputum 2/2: MRSA  Results for orders placed during the hospital encounter of 11/01/12  MRSA PCR SCREENING     Status: Abnormal   Collection Time    11/01/12  3:19 PM      Result Value Range Status   MRSA by PCR POSITIVE (*) NEGATIVE Final   Comment:            The GeneXpert MRSA Assay (FDA     approved for NASAL specimens     only), is one component of a     comprehensive MRSA colonization     surveillance program. It is not     intended to diagnose MRSA     infection nor to guide or     monitor treatment for     MRSA infections.     RESULT CALLED TO, READ BACK BY AND VERIFIED WITH:     Festus Holts RN 16:50 11/01/12 ([email or web address])  CULTURE, RESPIRATORY     Status: None   Collection Time    11/03/12  9:09 AM      Result Value Range Status   Specimen Description TRACHEAL ASPIRATE   Final   Special Requests Normal   Final   Gram Stain     Final   Value: MODERATE WBC PRESENT, PREDOMINANTLY PMN   RARE SQUAMOUS EPITHELIAL CELLS PRESENT     MODERATE GRAM POSITIVE COCCI     IN PAIRS IN CLUSTERS   Culture     Final   Value: ABUNDANT METHICILLIN RESISTANT STAPHYLOCOCCUS AUREUS     Note: RIFAMPIN AND GENTAMICIN SHOULD NOT BE USED AS SINGLE DRUGS FOR TREATMENT OF STAPH INFECTIONS. CRITICAL RESULT CALLED TO, READ BACK BY AND VERIFIED WITH: MARION L@7 :41AM ON 11/06/12 BY DANTS   Report Status 11/06/2012 FINAL   Final   Organism ID, Bacteria METHICILLIN RESISTANT STAPHYLOCOCCUS AUREUS   Final     ANTIBIOTICS: Per Internal medicine   SUBJECTIVE: Tolerating ATC 24/7 28%.  C-Collar in place.  No distress.    VITAL SIGNS: Reviewed.    PHYSICAL EXAMINATION: General:  Chronically ill appearing male, confused Neuro:  Awake, trys to speak. Left sided weakness. C collar in place  HEENT:  C collar in place. #8 trach mid-line  Cardiovascular:  rrr Lungs:  Scattered rhonchi  Abdomen:  PEG intact  Musculoskeletal:  Weak, left sided weakness, anasarca    Recent Labs Lab 11/11/12 0728 11/13/12 0647  NA 130* 133*  K 3.7 3.6  CL 94* 96  CO2 28  28  BUN 24* 29*  CREATININE 0.78 0.86  GLUCOSE 242* 148*    Recent Labs Lab 11/11/12 0728  HGB 11.6*  HCT 33.7*  WBC 5.5  PLT 199   Dg Abd Portable 1v  11/15/2012  *RADIOLOGY REPORT*  Clinical Data: PEG placement  PORTABLE ABDOMEN - 1 VIEW  Comparison: None.  Findings: Again noted a percutaneous gastric tube with the colon just above the transverse colon  in  left upper abdomen.  Stool and gas noted within colon.  IMPRESSION: Again noted a percutaneous gastric tube with the colon just above the transverse colon  in left upper abdomen.  Stool and gas noted within colon.   Original Report Authenticated By: Natasha Mead, M.D.    Dg Abd Portable 1v  11/15/2012  *RADIOLOGY REPORT*  Clinical Data: Peg tube placement.  PORTABLE ABDOMEN - 1 VIEW  Comparison: 11/05/2012  Findings: The peg tube projects over the left upper quadrant, above the transverse  colon air column.  Egbert Garibaldi shot projects over the left abdomen.  Gas and stool present in the colon.  Old bilateral lower rib deformities are present.  IMPRESSION:  1.  A peg tube projects over the left upper quadrant, with the balloon or bell just above the level of the transverse colon. 2.  Mild prominence of gas and stool in the colon. 3.  Numerous old bilateral lower rib fractures.   Original Report Authenticated By: Gaylyn Rong, M.D.     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 The decision to decannulate will depend on how much he improves from neurological standpoint & physical mobility.  He remains in C-Collar.     Plan: Cont abx Cont routine trach care SLP eval when MS appropriate, otherwise strict NPO F/u CXR intermittently C collar removed.  Tracheostomy to be changed to size 6, hold decannulation for now.  Will likely progress to decannulation if physical condition improves but not now.  Alyson Reedy, M.D. [facility]/Critical Care Medicine. Pager: [phone]. After hours pager: [phone].

## 2012-11-16 LAB — BASIC METABOLIC PANEL
BUN: 23 mg/dL (ref 6–23)
CO2: 26 mEq/L (ref 19–32)
Glucose, Bld: 175 mg/dL — ABNORMAL HIGH (ref 70–99)
Potassium: 3.5 mEq/L (ref 3.5–5.1)
Sodium: 130 mEq/L — ABNORMAL LOW (ref 135–145)

## 2012-11-17 LAB — URINALYSIS, ROUTINE W REFLEX MICROSCOPIC
Ketones, ur: NEGATIVE mg/dL
Nitrite: NEGATIVE
pH: 5 (ref 5.0–8.0)

## 2012-11-17 LAB — BASIC METABOLIC PANEL
BUN: 46 mg/dL — ABNORMAL HIGH (ref 6–23)
CO2: 26 mEq/L (ref 19–32)
Chloride: 91 mEq/L — ABNORMAL LOW (ref 96–112)
Creatinine, Ser: 1.68 mg/dL — ABNORMAL HIGH (ref 0.50–1.35)
Glucose, Bld: 222 mg/dL — ABNORMAL HIGH (ref 70–99)
Potassium: 4.2 mEq/L (ref 3.5–5.1)

## 2012-11-17 LAB — URINE MICROSCOPIC-ADD ON

## 2012-11-18 ENCOUNTER — Other Ambulatory Visit (HOSPITAL_COMMUNITY): Payer: Medicare Other

## 2012-11-18 LAB — CBC WITH DIFFERENTIAL/PLATELET
Eosinophils Absolute: 0.2 10*3/uL (ref 0.0–0.7)
Hemoglobin: 11.9 g/dL — ABNORMAL LOW (ref 13.0–17.0)
Lymphocytes Relative: 23 % (ref 12–46)
Lymphs Abs: 1.4 10*3/uL (ref 0.7–4.0)
MCH: 28.3 pg (ref 26.0–34.0)
Monocytes Relative: 13 % — ABNORMAL HIGH (ref 3–12)
Neutro Abs: 3.7 10*3/uL (ref 1.7–7.7)
Neutrophils Relative %: 60 % (ref 43–77)
RBC: 4.2 MIL/uL — ABNORMAL LOW (ref 4.22–5.81)

## 2012-11-18 LAB — COMPREHENSIVE METABOLIC PANEL
Alkaline Phosphatase: 135 U/L — ABNORMAL HIGH (ref 39–117)
BUN: 60 mg/dL — ABNORMAL HIGH (ref 6–23)
CO2: 27 mEq/L (ref 19–32)
Chloride: 91 mEq/L — ABNORMAL LOW (ref 96–112)
GFR calc Af Amer: 50 mL/min — ABNORMAL LOW (ref >90)
Glucose, Bld: 186 mg/dL — ABNORMAL HIGH (ref 70–99)
Potassium: 4.3 mEq/L (ref 3.5–5.1)
Total Bilirubin: 0.6 mg/dL (ref 0.3–1.2)

## 2012-11-18 LAB — URINE CULTURE: Culture: NO GROWTH

## 2012-11-18 LAB — OSMOLALITY, URINE: Osmolality, Ur: 425 mOsm/kg (ref 390–1090)

## 2012-11-18 NOTE — Progress Notes (Signed)
PULMONARY  / CRITICAL CARE MEDICINE  Name: Alex Foster MRN: [id number] DOB: 1945-10-30    ADMISSION DATE:  11/05/2012 CONSULTATION DATE:  2/5  REFERRING MD :  Hijazi   CHIEF COMPLAINT:  Vent weaning   BRIEF PATIENT DESCRIPTION:  67 yom fell at SNF, resulting in: both falcine and tentorial subdural hematomas and a few new left rib fractures. He was admitted by the trauma service to the neurologic ICU and neurosurgery was consulted.Neurosurgery felt that the patient's brain lesions were non-operable. Follow-up CT scans were stable. Neurosurgery was of the opinion that there was a good chance the patient would not recover well given his recent brain trauma. Because of the respiratory difficulty the patient had during his hospitalization 6 months ago and the fact that he was minimally responsive on the ventilator, so he underwent  tracheostomy and PEG tube placement. Arrives to [facility] for vent weaning.    SIGNIFICANT EVENTS / STUDIES:   LINES / TUBES: PERC trach and PEG 2/3>>>  CULTURES: Sputum 2/2>>>MRSA  UC 2/16>>>   ANTIBIOTICS: Per Internal medicine   SUBJECTIVE: Tolerating ATC 24/7 28%.  No distress.    VITAL SIGNS: Reviewed.    PHYSICAL EXAMINATION: General:  Chronically ill appearing male, confused Neuro:  Awake, trys to speak. Left sided weakness.  HEENT:  #6 trach mid-line  Cardiovascular:  rrr Lungs:  Scattered rhonchi  Abdomen:  PEG intact  Musculoskeletal:  Weak, left sided weakness, anasarca    Recent Labs Lab 11/16/12 0530 11/17/12 0654 11/18/12 0622  NA 130* 129* 128*  K 3.5 4.2 4.3  CL 92* 91* 91*  CO2 26 26 27   BUN 23 46* 60*  CREATININE 0.88 1.68* 1.61*  GLUCOSE 175* 222* 186*    Recent Labs Lab 11/18/12 0622  HGB 11.9*  HCT 34.4*  WBC 6.1  PLT 292   No results found.  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 The decision to decannulate will depend on how much he improves from neurological standpoint & physical mobility.  C-Collar cleared 2/14.     Plan: -abx per primary MD -Cont routine trach care -SLP eval when MS appropriate, otherwise strict NPO -F/u CXR intermittently -C collar removed, continue #6 trach for now.  Will consider decannulation if physical conditioning improves.    Canary Brim, NP-C [facility] Pgr: [phone] or [phone]  Reviewed above, examined pt, and agree with assessment/plan.  He needs to have improvement in conditioning and cough prior to considering decannulation.  Will f/u next week.  Call if help needed sooner.   Coralyn Helling, MD [facility] Pulmonary/Critical Care 11/18/2012, 3:33 PM Pager:  [phone] After 3pm call: [phone]

## 2012-11-19 ENCOUNTER — Other Ambulatory Visit (HOSPITAL_COMMUNITY): Payer: Self-pay

## 2012-11-19 LAB — BASIC METABOLIC PANEL
Calcium: 9 mg/dL (ref 8.4–10.5)
Creatinine, Ser: 1.92 mg/dL — ABNORMAL HIGH (ref 0.50–1.35)
GFR calc non Af Amer: 35 mL/min — ABNORMAL LOW (ref >90)
Sodium: 129 mEq/L — ABNORMAL LOW (ref 135–145)

## 2012-11-19 LAB — PRO B NATRIURETIC PEPTIDE: Pro B Natriuretic peptide (BNP): 184.9 pg/mL — ABNORMAL HIGH (ref 0–125)

## 2012-11-20 ENCOUNTER — Other Ambulatory Visit (HOSPITAL_COMMUNITY): Payer: Self-pay

## 2012-11-20 LAB — PROTIME-INR
INR: 1.02 (ref 0.00–1.49)
Prothrombin Time: 13.3 seconds (ref 11.6–15.2)

## 2012-11-20 LAB — BASIC METABOLIC PANEL
Calcium: 8.5 mg/dL (ref 8.4–10.5)
Creatinine, Ser: 2.39 mg/dL — ABNORMAL HIGH (ref 0.50–1.35)
GFR calc non Af Amer: 27 mL/min — ABNORMAL LOW (ref >90)
Glucose, Bld: 182 mg/dL — ABNORMAL HIGH (ref 70–99)
Sodium: 130 mEq/L — ABNORMAL LOW (ref 135–145)

## 2012-11-21 ENCOUNTER — Other Ambulatory Visit (HOSPITAL_COMMUNITY): Payer: Self-pay

## 2012-11-21 LAB — BASIC METABOLIC PANEL
BUN: 86 mg/dL — ABNORMAL HIGH (ref 6–23)
CO2: 27 mEq/L (ref 19–32)
Calcium: 8.9 mg/dL (ref 8.4–10.5)
Creatinine, Ser: 1.76 mg/dL — ABNORMAL HIGH (ref 0.50–1.35)

## 2012-11-21 LAB — CBC
MCHC: 34 g/dL (ref 30.0–36.0)
MCV: 83.8 fL (ref 78.0–100.0)
Platelets: 252 10*3/uL (ref 150–400)
RDW: 14.4 % (ref 11.5–15.5)
WBC: 5.5 10*3/uL (ref 4.0–10.5)

## 2012-11-22 LAB — CBC
MCH: 28.3 pg (ref 26.0–34.0)
MCHC: 33.3 g/dL (ref 30.0–36.0)
MCV: 85 fL (ref 78.0–100.0)
Platelets: 270 10*3/uL (ref 150–400)
RBC: 3.81 MIL/uL — ABNORMAL LOW (ref 4.22–5.81)
RDW: 14.4 % (ref 11.5–15.5)

## 2012-11-22 LAB — RENAL FUNCTION PANEL
Albumin: 2.9 g/dL — ABNORMAL LOW (ref 3.5–5.2)
BUN: 59 mg/dL — ABNORMAL HIGH (ref 6–23)
Calcium: 9.4 mg/dL (ref 8.4–10.5)
Chloride: 104 mEq/L (ref 96–112)
Creatinine, Ser: 1.27 mg/dL (ref 0.50–1.35)
GFR calc non Af Amer: 58 mL/min — ABNORMAL LOW (ref >90)
Phosphorus: 3.3 mg/dL (ref 2.3–4.6)

## 2012-11-23 LAB — BASIC METABOLIC PANEL
Calcium: 9.7 mg/dL (ref 8.4–10.5)
Creatinine, Ser: 1.18 mg/dL (ref 0.50–1.35)
GFR calc Af Amer: 73 mL/min — ABNORMAL LOW (ref >90)
GFR calc non Af Amer: 63 mL/min — ABNORMAL LOW (ref >90)
Sodium: 140 mEq/L (ref 135–145)

## 2012-11-24 LAB — BASIC METABOLIC PANEL
Calcium: 9.7 mg/dL (ref 8.4–10.5)
GFR calc Af Amer: 87 mL/min — ABNORMAL LOW (ref >90)
GFR calc non Af Amer: 75 mL/min — ABNORMAL LOW (ref >90)
Glucose, Bld: 78 mg/dL (ref 70–99)
Potassium: 5.2 mEq/L — ABNORMAL HIGH (ref 3.5–5.1)
Sodium: 139 mEq/L (ref 135–145)

## 2012-11-25 LAB — BASIC METABOLIC PANEL
BUN: 52 mg/dL — ABNORMAL HIGH (ref 6–23)
Calcium: 9.3 mg/dL (ref 8.4–10.5)
GFR calc Af Amer: 73 mL/min — ABNORMAL LOW (ref >90)
GFR calc non Af Amer: 63 mL/min — ABNORMAL LOW (ref >90)
Glucose, Bld: 120 mg/dL — ABNORMAL HIGH (ref 70–99)
Potassium: 4.8 mEq/L (ref 3.5–5.1)
Sodium: 140 mEq/L (ref 135–145)

## 2012-11-25 NOTE — Progress Notes (Signed)
PULMONARY  / CRITICAL CARE MEDICINE  Name: Alex Foster MRN: [id number] DOB: [birth year]    ADMISSION DATE:  11/05/2012 CONSULTATION DATE:  2/5  REFERRING MD :  Hijazi   CHIEF COMPLAINT:  Vent weaning   BRIEF PATIENT DESCRIPTION:  67 yom fell at SNF, resulting in: both falcine and tentorial subdural hematomas and a few new left rib fractures. He was admitted by the trauma service to the neurologic ICU and neurosurgery was consulted.Neurosurgery felt that the patient's brain lesions were non-operable. Follow-up CT scans were stable. Neurosurgery was of the opinion that there was a good chance the patient would not recover well given his recent brain trauma. Because of the respiratory difficulty the patient had during his hospitalization 6 months ago and the fact that he was minimally responsive on the ventilator, so he underwent  tracheostomy and PEG tube placement. Arrives to [facility] for vent weaning.    SIGNIFICANT EVENTS / STUDIES:   LINES / TUBES: PERC trach and PEG 2/3>>>  CULTURES: Sputum 2/2>>>MRSA  UC 2/16>>>   ANTIBIOTICS: Per Internal medicine   SUBJECTIVE: Tolerating ATC 24/7 28%.  No distress.    VITAL SIGNS: Vital signs reviewed. Abnormal values will appear under impression plan section.    PHYSICAL EXAMINATION: General:  Chronically ill appearing male, confused Neuro:  Awake, trys to speak. Left sided weakness.  HEENT:  Trach out  Cardiovascular:  rrr Lungs:  Scattered rhonchi  Abdomen:  PEG intact  Musculoskeletal:  Weak, left sided weakness, anasarca    Recent Labs Lab 11/23/12 0700 11/24/12 0630 11/24/12 1100 11/25/12 0621  NA 140 139  --  140  K 4.8 5.2* 5.1 4.8  CL 103 102  --  103  CO2 32 26  --  29  BUN 47* 45*  --  52*  CREATININE 1.18 1.02  --  1.18  GLUCOSE 131* 78  --  120*    Recent Labs Lab 11/21/12 0827 11/22/12 0617  HGB 10.7* 10.8*  HCT 31.5* 32.4*  WBC 5.5 5.1  PLT 252 270   No results found.  ASSESSMENT / PLAN: 1) TBI  w/ small non-op traumatic SDH 2) Trach dependent d/t #1, decan self 19th 3) MRSA PNA 4) h/o CVA 5) DM w/ hyperglycemia  6) anemia  7) HTN 8) multiple rib fractures  9) DVT of right axillary vein      Plan: -SLP eval when MS appropriate, otherwise strict NPO With trach out, he has progressed , maintain secretions fine, on RA -F/u CXR intermittently for atx s/p self decannulation -C collar removed,trach out PCCM will sign off  Alex Foster ACNP Alex Foster PCCM Pager [phone] till 3 pm If no answer page [phone] 11/25/2012, 12:09 PM   I have fully examined this patient and agree with above findings.    And edited in full  Alex Foster. Alex Alias, MD, FACP Pgr: [phone] [place] [facility]

## 2012-11-26 LAB — BASIC METABOLIC PANEL
BUN: 49 mg/dL — ABNORMAL HIGH (ref 6–23)
Chloride: 103 mEq/L (ref 96–112)
Creatinine, Ser: 1.2 mg/dL (ref 0.50–1.35)
GFR calc Af Amer: 72 mL/min — ABNORMAL LOW (ref >90)
GFR calc non Af Amer: 62 mL/min — ABNORMAL LOW (ref >90)
Potassium: 4.6 mEq/L (ref 3.5–5.1)

## 2012-11-27 LAB — CBC
Platelets: 180 10*3/uL (ref 150–400)
RBC: 4.11 MIL/uL — ABNORMAL LOW (ref 4.22–5.81)
RDW: 13.9 % (ref 11.5–15.5)
WBC: 4.7 10*3/uL (ref 4.0–10.5)

## 2012-11-27 LAB — BASIC METABOLIC PANEL
Calcium: 9.3 mg/dL (ref 8.4–10.5)
Creatinine, Ser: 1.1 mg/dL (ref 0.50–1.35)
GFR calc Af Amer: 79 mL/min — ABNORMAL LOW (ref >90)
Sodium: 140 mEq/L (ref 135–145)

## 2012-11-28 LAB — BASIC METABOLIC PANEL
BUN: 39 mg/dL — ABNORMAL HIGH (ref 6–23)
Calcium: 9.2 mg/dL (ref 8.4–10.5)
GFR calc non Af Amer: 61 mL/min — ABNORMAL LOW (ref >90)
Glucose, Bld: 79 mg/dL (ref 70–99)

## 2012-11-29 LAB — BASIC METABOLIC PANEL
BUN: 25 mg/dL — ABNORMAL HIGH (ref 6–23)
Chloride: 102 mEq/L (ref 96–112)
Creatinine, Ser: 1.01 mg/dL (ref 0.50–1.35)
GFR calc Af Amer: 88 mL/min — ABNORMAL LOW (ref >90)
GFR calc non Af Amer: 76 mL/min — ABNORMAL LOW (ref >90)
Glucose, Bld: 103 mg/dL — ABNORMAL HIGH (ref 70–99)

## 2012-11-30 ENCOUNTER — Emergency Department (HOSPITAL_COMMUNITY)
Admission: EM | Admit: 2012-11-30 | Discharge: 2012-11-30 | Disposition: A | Discharge status: Skilled Nursing Facility | Payer: Medicare Other | Attending: Emergency Medicine | Admitting: Emergency Medicine

## 2012-11-30 ENCOUNTER — Encounter (HOSPITAL_COMMUNITY): Payer: Self-pay | Admitting: *Deleted

## 2012-11-30 DIAGNOSIS — Z8673 Personal history of transient ischemic attack (TIA), and cerebral infarction without residual deficits: Secondary | ICD-10-CM | POA: Insufficient documentation

## 2012-11-30 DIAGNOSIS — F17200 Nicotine dependence, unspecified, uncomplicated: Secondary | ICD-10-CM | POA: Insufficient documentation

## 2012-11-30 DIAGNOSIS — Z79899 Other long term (current) drug therapy: Secondary | ICD-10-CM | POA: Insufficient documentation

## 2012-11-30 DIAGNOSIS — Z8701 Personal history of pneumonia (recurrent): Secondary | ICD-10-CM | POA: Insufficient documentation

## 2012-11-30 DIAGNOSIS — Z8614 Personal history of Methicillin resistant Staphylococcus aureus infection: Secondary | ICD-10-CM | POA: Insufficient documentation

## 2012-11-30 DIAGNOSIS — Z862 Personal history of diseases of the blood and blood-forming organs and certain disorders involving the immune mechanism: Secondary | ICD-10-CM | POA: Insufficient documentation

## 2012-11-30 DIAGNOSIS — Z87820 Personal history of traumatic brain injury: Secondary | ICD-10-CM | POA: Insufficient documentation

## 2012-11-30 DIAGNOSIS — Z8639 Personal history of other endocrine, nutritional and metabolic disease: Secondary | ICD-10-CM | POA: Insufficient documentation

## 2012-11-30 DIAGNOSIS — E119 Type 2 diabetes mellitus without complications: Secondary | ICD-10-CM | POA: Insufficient documentation

## 2012-11-30 DIAGNOSIS — Z043 Encounter for examination and observation following other accident: Secondary | ICD-10-CM | POA: Insufficient documentation

## 2012-11-30 DIAGNOSIS — Z8781 Personal history of (healed) traumatic fracture: Secondary | ICD-10-CM | POA: Insufficient documentation

## 2012-11-30 DIAGNOSIS — Z794 Long term (current) use of insulin: Secondary | ICD-10-CM | POA: Insufficient documentation

## 2012-11-30 DIAGNOSIS — J4489 Other specified chronic obstructive pulmonary disease: Secondary | ICD-10-CM | POA: Insufficient documentation

## 2012-11-30 DIAGNOSIS — I252 Old myocardial infarction: Secondary | ICD-10-CM | POA: Insufficient documentation

## 2012-11-30 NOTE — ED Provider Notes (Signed)
History     CSN: [id number]  Arrival date & time 11/30/12  1616   First MD Initiated Contact with Patient 11/30/12 1617      Chief Complaint  Patient presents with  . Fall    (Consider location/radiation/quality/duration/timing/severity/associated sxs/prior treatment) Patient is a 67 y.o. male presenting with fall. The history is provided by the patient.  Fall Pertinent negatives include no numbness, no abdominal pain, no nausea, no vomiting and no headaches.   patient was found on the floor next to his bed in the nursing home. Patient is without complaints now. He states he is not sure what happened. He has been transferred to the nursing home yesterday. Previous head injury. He is at his neurologic baseline per nursing home note. He states he ambulates a little to go to the bathroom. He recently had the trach and PEG which have both been removed.  Past Medical History  Diagnosis Date  . Stroke 01/2004    Intracebral hemorrhage in the setting of HTN  . Hyperlipidemia   . COPD (chronic obstructive pulmonary disease)     Active smoker, has oxygen for nightime use.  Cannot tolerate using mask.  . Low back pain     Goes to [facility]  . H/O alcohol abuse   . Myocardial infarction   . Hypertension   . Diabetes mellitus   . 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 Extensive facial fractures 05/31/2012  . MRSA (methicillin resistant staphylococcus aureus) pneumonia 06/02/2012    Past Surgical History  Procedure Laterality Date  . Transthoracic echocardiogram  01/2010    EF 60%, mild LVH, mild RV dilation with normal systolic function  . Nm myoview ltd  01/2010    Lexixan myoview: EF 67%, no ischemia or infarction  . Abi  01/2006    0.93 Normal  . Anteriolisthesis  06/26/2006  . Electrocardiogram  01/02/2006    1st degree block  . Cataract extraction, bilateral      per patient  . Percutaneous tracheostomy  11/04/2012    Procedure: PERCUTANEOUS TRACHEOSTOMY;  Surgeon: Cherylynn Ridges, MD;  Location: [facility] OR;  Service: General;  Laterality: N/A;    Family History  Problem Relation Age of Onset  . Heart disease Father   . Heart disease Sister     History  Substance Use Topics  . Smoking status: Current Every Day Smoker -- 0.25 packs/day for 52 years    Types: Cigarettes  . Smokeless tobacco: Not on file     Comment: smokes electronic cigarettes. trying his best to quitt  . Alcohol Use:       Review of Systems  Constitutional: Negative for activity change and appetite change.  HENT: Negative for neck stiffness.   Eyes: Negative for pain.  Respiratory: Negative for chest tightness and shortness of breath.   Cardiovascular: Negative for chest pain and leg swelling.  Gastrointestinal: Negative for nausea, vomiting, abdominal pain and diarrhea.  Genitourinary: Negative for flank pain.  Musculoskeletal: Negative for back pain.  Skin: Negative for rash.  Neurological: Negative for weakness, numbness and headaches.  Psychiatric/Behavioral: Negative for behavioral problems.    Allergies  Penicillins  Home Medications   Current Outpatient Rx  Name  Route  Sig  Dispense  Refill  . amantadine (SYMMETREL) 100 MG capsule   Oral   Take 100 mg by mouth every morning.         Marland Kitchen amLODipine (NORVASC) 5 MG tablet   Oral   Take  5 mg by mouth every morning.         . busPIRone (BUSPAR) 5 MG tablet   Oral   Take 5 mg by mouth 2 (two) times daily.         . citalopram (CELEXA) 20 MG tablet   Oral   Take 20 mg by mouth every morning.         . cyclobenzaprine (FLEXERIL) 5 MG tablet   Oral   Take 5 mg by mouth 2 (two) times daily.         . divalproex (DEPAKOTE) 500 MG DR tablet   Oral   Take 500 mg by mouth 2 (two) times daily.         Marland Kitchen glimepiride (AMARYL) 1 MG tablet   Oral   Take 1 mg by mouth 2 (two) times daily.         . insulin aspart (NOVOLOG) 100 UNIT/ML injection   Subcutaneous   Inject 5 Units into the skin 3 (three) times  daily as needed for high blood sugar (if CBG >150).         . memantine (NAMENDA) 5 MG tablet   Oral   Take 5 mg by mouth every morning.         Marland Kitchen oxyCODONE (OXY IR/ROXICODONE) 5 MG immediate release tablet   Oral   Take 5 mg by mouth every 6 (six) hours as needed for pain.         . pantoprazole (PROTONIX) 40 MG tablet   Oral   Take 40 mg by mouth every morning.         Marland Kitchen QUEtiapine (SEROQUEL) 25 MG tablet   Oral   Take 25 mg by mouth 2 (two) times daily.           BP 147/96  Pulse 107  Temp(Src) 98.4 F (36.9 C) (Oral)  Resp 18  SpO2 92%  Physical Exam  Constitutional: He appears well-developed and well-nourished.  HENT:  Head: Normocephalic and atraumatic.  Eyes: Pupils are equal, round, and reactive to light.  Neck: Normal range of motion. Neck supple.  Previous trach healing well.    Cardiovascular: Normal rate and regular rhythm.   Pulmonary/Chest: Effort normal.  Mildly harsh breath sounds.  Abdominal: Soft. Bowel sounds are normal. There is no tenderness.  Musculoskeletal: Normal range of motion. He exhibits no edema.  Neurological: He is alert.  Patient is awake and appropriate. He follows commands.  Skin: Skin is warm.    ED Course  Procedures (including critical care time)  Labs Reviewed - No data to display No results found.   1. Fall, initial encounter       MDM  Patient with possible fall in nursing home. Final before. No evidence of trauma on the patient. Patient is awake and at his baseline. His been stable for over 2 hours. Mental status remained stable. He'll be discharged home back to the nursing home and will have neuro checks on there. He will be discharged        Juliet Rude. Rubin Payor, MD 11/30/12 [phone]

## 2012-11-30 NOTE — ED Notes (Signed)
Unknown if there was head injury during fall

## 2012-11-30 NOTE — ED Notes (Signed)
Pt in from [facility], pt found on floor today @ 15:30, per EMS facility staff reports hearing the pts bed alarm & finding him on the floor close to his bed, no obvious deformities, pt able to bear weight with assistance & pt denies back tenderness, per staff & EMS pt neuro status is at baseline, per report the pts pupils are slightly constrictly bilaterally, pt released from [facility] hospital yesterday d/t fall x 2 wks ago, pt able to move all extremities

## 2012-12-30 ENCOUNTER — Ambulatory Visit (HOSPITAL_COMMUNITY)
Admission: RE | Admit: 2012-12-30 | Discharge: 2012-12-30 | Disposition: A | Discharge status: Home / Self Care | Payer: Medicare Other | Source: Ambulatory Visit | Attending: Internal Medicine | Admitting: Internal Medicine

## 2012-12-30 DIAGNOSIS — J690 Pneumonitis due to inhalation of food and vomit: Secondary | ICD-10-CM | POA: Insufficient documentation

## 2012-12-30 DIAGNOSIS — R1313 Dysphagia, pharyngeal phase: Secondary | ICD-10-CM | POA: Insufficient documentation

## 2012-12-30 DIAGNOSIS — R1310 Dysphagia, unspecified: Secondary | ICD-10-CM
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
## 2013-02-07 ENCOUNTER — Non-Acute Institutional Stay (SKILLED_NURSING_FACILITY): Payer: Medicare Other | Admitting: Adult Health

## 2013-02-07 DIAGNOSIS — I10 Essential (primary) hypertension: Secondary | ICD-10-CM

## 2013-02-07 DIAGNOSIS — E039 Hypothyroidism, unspecified: Secondary | ICD-10-CM

## 2013-02-07 DIAGNOSIS — K219 Gastro-esophageal reflux disease without esophagitis: Secondary | ICD-10-CM

## 2013-02-07 DIAGNOSIS — J449 Chronic obstructive pulmonary disease, unspecified: Secondary | ICD-10-CM

## 2013-02-07 DIAGNOSIS — F341 Dysthymic disorder: Secondary | ICD-10-CM

## 2013-02-07 DIAGNOSIS — F329 Major depressive disorder, single episode, unspecified: Secondary | ICD-10-CM

## 2013-02-07 DIAGNOSIS — F0150 Vascular dementia without behavioral disturbance: Secondary | ICD-10-CM

## 2013-02-07 DIAGNOSIS — M545 Low back pain: Secondary | ICD-10-CM

## 2013-03-07 ENCOUNTER — Non-Acute Institutional Stay (SKILLED_NURSING_FACILITY): Payer: Medicare Other | Admitting: Adult Health

## 2013-03-07 DIAGNOSIS — R1310 Dysphagia, unspecified: Secondary | ICD-10-CM

## 2013-03-07 DIAGNOSIS — M549 Dorsalgia, unspecified: Secondary | ICD-10-CM

## 2013-03-07 DIAGNOSIS — F419 Anxiety disorder, unspecified: Secondary | ICD-10-CM

## 2013-03-07 DIAGNOSIS — E039 Hypothyroidism, unspecified: Secondary | ICD-10-CM

## 2013-03-07 DIAGNOSIS — E118 Type 2 diabetes mellitus with unspecified complications: Secondary | ICD-10-CM

## 2013-03-07 DIAGNOSIS — I10 Essential (primary) hypertension: Secondary | ICD-10-CM

## 2013-03-07 DIAGNOSIS — F411 Generalized anxiety disorder: Secondary | ICD-10-CM

## 2013-04-10 ENCOUNTER — Other Ambulatory Visit: Payer: Self-pay

## 2013-04-11 ENCOUNTER — Emergency Department (HOSPITAL_COMMUNITY)
Admission: EM | Admit: 2013-04-11 | Discharge: 2013-04-11 | Disposition: A | Discharge status: Home / Self Care | Payer: Medicare Other | Attending: Emergency Medicine | Admitting: Emergency Medicine

## 2013-04-11 ENCOUNTER — Emergency Department (HOSPITAL_COMMUNITY): Payer: Medicare Other

## 2013-04-11 ENCOUNTER — Encounter (HOSPITAL_COMMUNITY): Payer: Self-pay

## 2013-04-11 DIAGNOSIS — Y9389 Activity, other specified: Secondary | ICD-10-CM | POA: Insufficient documentation

## 2013-04-11 DIAGNOSIS — S72453A Displaced supracondylar fracture without intracondylar extension of lower end of unspecified femur, initial encounter for closed fracture: Secondary | ICD-10-CM | POA: Insufficient documentation

## 2013-04-11 DIAGNOSIS — Z8614 Personal history of Methicillin resistant Staphylococcus aureus infection: Secondary | ICD-10-CM | POA: Insufficient documentation

## 2013-04-11 DIAGNOSIS — Z8659 Personal history of other mental and behavioral disorders: Secondary | ICD-10-CM | POA: Insufficient documentation

## 2013-04-11 DIAGNOSIS — Z794 Long term (current) use of insulin: Secondary | ICD-10-CM | POA: Insufficient documentation

## 2013-04-11 DIAGNOSIS — J449 Chronic obstructive pulmonary disease, unspecified: Secondary | ICD-10-CM | POA: Insufficient documentation

## 2013-04-11 DIAGNOSIS — I252 Old myocardial infarction: Secondary | ICD-10-CM | POA: Insufficient documentation

## 2013-04-11 DIAGNOSIS — Y929 Unspecified place or not applicable: Secondary | ICD-10-CM | POA: Insufficient documentation

## 2013-04-11 DIAGNOSIS — Z87891 Personal history of nicotine dependence: Secondary | ICD-10-CM | POA: Insufficient documentation

## 2013-04-11 DIAGNOSIS — Z880 Allergy status to penicillin: Secondary | ICD-10-CM | POA: Insufficient documentation

## 2013-04-11 DIAGNOSIS — S72451G Displaced supracondylar fracture without intracondylar extension of lower end of right femur, subsequent encounter for closed fracture with delayed healing: Secondary | ICD-10-CM

## 2013-04-11 DIAGNOSIS — W1809XA Striking against other object with subsequent fall, initial encounter: Secondary | ICD-10-CM | POA: Insufficient documentation

## 2013-04-11 DIAGNOSIS — Z79899 Other long term (current) drug therapy: Secondary | ICD-10-CM | POA: Insufficient documentation

## 2013-04-11 DIAGNOSIS — R040 Epistaxis: Secondary | ICD-10-CM | POA: Insufficient documentation

## 2013-04-11 DIAGNOSIS — D329 Benign neoplasm of meninges, unspecified: Secondary | ICD-10-CM

## 2013-04-11 DIAGNOSIS — E119 Type 2 diabetes mellitus without complications: Secondary | ICD-10-CM | POA: Insufficient documentation

## 2013-04-11 DIAGNOSIS — I10 Essential (primary) hypertension: Secondary | ICD-10-CM | POA: Insufficient documentation

## 2013-04-11 DIAGNOSIS — J4489 Other specified chronic obstructive pulmonary disease: Secondary | ICD-10-CM | POA: Insufficient documentation

## 2013-04-11 DIAGNOSIS — Z87820 Personal history of traumatic brain injury: Secondary | ICD-10-CM | POA: Insufficient documentation

## 2013-04-11 DIAGNOSIS — S02402A Zygomatic fracture, unspecified, initial encounter for closed fracture: Secondary | ICD-10-CM

## 2013-04-11 DIAGNOSIS — Z8701 Personal history of pneumonia (recurrent): Secondary | ICD-10-CM | POA: Insufficient documentation

## 2013-04-11 DIAGNOSIS — S0230XA Fracture of orbital floor, unspecified side, initial encounter for closed fracture: Secondary | ICD-10-CM | POA: Insufficient documentation

## 2013-04-11 DIAGNOSIS — E785 Hyperlipidemia, unspecified: Secondary | ICD-10-CM | POA: Insufficient documentation

## 2013-04-11 MED ORDER — OXYCODONE-ACETAMINOPHEN 5-325 MG PO TABS
1.0000 | ORAL_TABLET | Freq: Once | ORAL | Status: AC
  Administered 2013-04-11: 1 via ORAL
  Filled 2013-04-11: qty 1

## 2013-04-11 NOTE — ED Notes (Signed)
Bed:WHALA<BR> Expected date:<BR> Expected time:<BR> Means of arrival:<BR> Comments:<BR> ems 67 yo M, generalized weakness, increased x1 week

## 2013-04-11 NOTE — ED Notes (Signed)
Denies HA at this time

## 2013-04-11 NOTE — ED Notes (Signed)
[id number]<OV> Expected date:<BR> Expected time:<BR> Means of arrival:<BR> Comments:<BR> Closed for IT stuff

## 2013-04-11 NOTE — ED Notes (Signed)
Patient able to move all extremities. Left eye with bruising and swelling. Abrasion to left nostril-controlled bleeding.
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
## 2013-04-11 NOTE — ED Notes (Signed)
Attempting to call report to [facility]

## 2013-04-11 NOTE — ED Notes (Signed)
[facility] called for pt pick up 

## 2013-04-11 NOTE — ED Notes (Signed)
Per EMS patient had a fall at the nursing home. From [facility]. Patient normally in wheelchair due to left sided weakness. Patient helped back into wheelchair. Staff noticed bleeding from nose which was controlled by the time EMS arrived. Pt has hx of dysphagia otherwise alert and oriented. Fall was unwitnessed. Patient states that he was trying to take a few steps and fell. Per NH patient fell at 0800. E hit his left eye. At 1230 patient had a nosebleed. Has had continued complaint of HA not relieved with tylenol.

## 2013-04-18 ENCOUNTER — Non-Acute Institutional Stay (SKILLED_NURSING_FACILITY): Payer: Medicare Other | Admitting: Adult Health

## 2013-04-18 DIAGNOSIS — E118 Type 2 diabetes mellitus with unspecified complications: Secondary | ICD-10-CM

## 2013-04-18 DIAGNOSIS — I10 Essential (primary) hypertension: Secondary | ICD-10-CM

## 2013-04-18 DIAGNOSIS — K219 Gastro-esophageal reflux disease without esophagitis: Secondary | ICD-10-CM

## 2013-04-18 DIAGNOSIS — M549 Dorsalgia, unspecified: Secondary | ICD-10-CM

## 2013-04-18 DIAGNOSIS — E1165 Type 2 diabetes mellitus with hyperglycemia: Secondary | ICD-10-CM

## 2013-04-18 DIAGNOSIS — R1310 Dysphagia, unspecified: Secondary | ICD-10-CM

## 2013-04-18 DIAGNOSIS — F0391 Unspecified dementia with behavioral disturbance: Secondary | ICD-10-CM

## 2013-04-18 DIAGNOSIS — F03918 Unspecified dementia, unspecified severity, with other behavioral disturbance: Secondary | ICD-10-CM

## 2013-04-18 DIAGNOSIS — F411 Generalized anxiety disorder: Secondary | ICD-10-CM

## 2013-04-18 DIAGNOSIS — E039 Hypothyroidism, unspecified: Secondary | ICD-10-CM

## 2013-04-18 HISTORY — DX: Unspecified dementia, unspecified severity, with other behavioral disturbance: F03.918

## 2013-04-18 HISTORY — DX: Unspecified dementia with behavioral disturbance: F03.91

## 2013-05-10 ENCOUNTER — Emergency Department (HOSPITAL_COMMUNITY): Payer: Medicare Other

## 2013-05-10 ENCOUNTER — Emergency Department (HOSPITAL_COMMUNITY)
Admission: EM | Admit: 2013-05-10 | Discharge: 2013-05-10 | Disposition: A | Discharge status: Home / Self Care | Payer: Medicare Other | Attending: Emergency Medicine | Admitting: Emergency Medicine

## 2013-05-10 ENCOUNTER — Encounter (HOSPITAL_COMMUNITY): Payer: Self-pay | Admitting: Emergency Medicine

## 2013-05-10 DIAGNOSIS — Z79899 Other long term (current) drug therapy: Secondary | ICD-10-CM | POA: Insufficient documentation

## 2013-05-10 DIAGNOSIS — I10 Essential (primary) hypertension: Secondary | ICD-10-CM | POA: Insufficient documentation

## 2013-05-10 DIAGNOSIS — E119 Type 2 diabetes mellitus without complications: Secondary | ICD-10-CM | POA: Insufficient documentation

## 2013-05-10 DIAGNOSIS — J449 Chronic obstructive pulmonary disease, unspecified: Secondary | ICD-10-CM | POA: Insufficient documentation

## 2013-05-10 DIAGNOSIS — Z87891 Personal history of nicotine dependence: Secondary | ICD-10-CM | POA: Insufficient documentation

## 2013-05-10 DIAGNOSIS — R51 Headache: Secondary | ICD-10-CM | POA: Insufficient documentation

## 2013-05-10 DIAGNOSIS — Z87820 Personal history of traumatic brain injury: Secondary | ICD-10-CM | POA: Insufficient documentation

## 2013-05-10 DIAGNOSIS — J4489 Other specified chronic obstructive pulmonary disease: Secondary | ICD-10-CM | POA: Insufficient documentation

## 2013-05-10 DIAGNOSIS — Y9389 Activity, other specified: Secondary | ICD-10-CM | POA: Insufficient documentation

## 2013-05-10 DIAGNOSIS — S0100XA Unspecified open wound of scalp, initial encounter: Secondary | ICD-10-CM | POA: Insufficient documentation

## 2013-05-10 DIAGNOSIS — S0990XA Unspecified injury of head, initial encounter: Secondary | ICD-10-CM

## 2013-05-10 DIAGNOSIS — W1809XA Striking against other object with subsequent fall, initial encounter: Secondary | ICD-10-CM | POA: Insufficient documentation

## 2013-05-10 DIAGNOSIS — Z8614 Personal history of Methicillin resistant Staphylococcus aureus infection: Secondary | ICD-10-CM | POA: Insufficient documentation

## 2013-05-10 DIAGNOSIS — Z9981 Dependence on supplemental oxygen: Secondary | ICD-10-CM | POA: Insufficient documentation

## 2013-05-10 DIAGNOSIS — I252 Old myocardial infarction: Secondary | ICD-10-CM | POA: Insufficient documentation

## 2013-05-10 DIAGNOSIS — Z880 Allergy status to penicillin: Secondary | ICD-10-CM | POA: Insufficient documentation

## 2013-05-10 DIAGNOSIS — Z794 Long term (current) use of insulin: Secondary | ICD-10-CM | POA: Insufficient documentation

## 2013-05-10 DIAGNOSIS — Y9210 Unspecified residential institution as the place of occurrence of the external cause: Secondary | ICD-10-CM | POA: Insufficient documentation

## 2013-05-10 DIAGNOSIS — E785 Hyperlipidemia, unspecified: Secondary | ICD-10-CM | POA: Insufficient documentation

## 2013-05-10 DIAGNOSIS — W050XXA Fall from non-moving wheelchair, initial encounter: Secondary | ICD-10-CM | POA: Insufficient documentation

## 2013-05-10 DIAGNOSIS — S0101XA Laceration without foreign body of scalp, initial encounter: Secondary | ICD-10-CM

## 2013-05-10 MED ORDER — HYDROCODONE-ACETAMINOPHEN 5-325 MG PO TABS: 1.0000 | ORAL_TABLET | ORAL | Status: DC | PRN

## 2013-05-10 MED ORDER — HYDROCODONE-ACETAMINOPHEN 5-325 MG PO TABS
1.0000 | ORAL_TABLET | Freq: Once | ORAL | Status: AC
  Administered 2013-05-10: 1 via ORAL
  Filled 2013-05-10: qty 1

## 2013-05-10 NOTE — ED Notes (Addendum)
Per [facility], pt from [place] nursing home. EMS reports pt was trying to get into his wheelchair when he fell over and hit his head, pt has a 1' laceration to the right posterior part of his head. Ems reports no LOC, no hx of being on blood thinners. Pt has hx of traumatic brain injury and stroke with left sided deficits as a result.

## 2013-05-10 NOTE — ED Provider Notes (Signed)
CSN: [id number]     Arrival date & time 05/10/13  1123 History     First MD Initiated Contact with Patient 05/10/13 1131     Chief Complaint  Patient presents with  . Fall    The history is provided by the patient and the nursing home.   patient resides at a nursing home secondary to traumatic brain injury.  He states he was transferring and his wheelchair when he fell and struck his posterior head on the door frame.  He presents to the laceration of his left parietal scalp.  No reported loss consciousness.  The patient reports moderate left-sided headache at this time.  No change in his vision.  No neck pain.  No weakness of his arms or legs.  No chest pain shortness of breath.  No hip pain. He has not use anticoagulants.  His symptoms are mild to moderate in severity.  Nothing worsens or improves his symptoms.s wheelchair when he fell over and struck his head.    Past Medical History  Diagnosis Date  . Hyperlipidemia   . COPD (chronic obstructive pulmonary disease)     Active smoker, has oxygen for nightime use.  Cannot tolerate using mask.  . Low back pain     Goes to [facility]  . H/O alcohol abuse   . Myocardial infarction   . Hypertension   . Diabetes mellitus   . 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 Extensive facial fractures 05/31/2012  . MRSA (methicillin resistant staphylococcus aureus) pneumonia 06/02/2012   Past Surgical History  Procedure Laterality Date  . Transthoracic echocardiogram  01/2010    EF 60%, mild LVH, mild RV dilation with normal systolic function  . Nm myoview ltd  01/2010    Lexixan myoview: EF 67%, no ischemia or infarction  . Abi  01/2006    0.93 Normal  . Anteriolisthesis  06/26/2006  . Electrocardiogram  01/02/2006    1st degree block  . Cataract extraction, bilateral      per patient  . Percutaneous tracheostomy  11/04/2012    Procedure: PERCUTANEOUS TRACHEOSTOMY;  Surgeon: Cherylynn Ridges, MD;  Location: [facility] OR;  Service: General;  Laterality:  N/A;   Family History  Problem Relation Age of Onset  . Heart disease Father   . Heart disease Sister    History  Substance Use Topics  . Smoking status: Former Smoker -- 0.25 packs/day for 52 years  . Smokeless tobacco: Never Used     Comment: smokes electronic cigarettes. trying his best to quitt  . Alcohol Use: No    Review of Systems  All other systems reviewed and are negative.    Allergies  Penicillins  Home Medications   Current Outpatient Rx  Name  Route  Sig  Dispense  Refill  . acetaminophen (TYLENOL) 325 MG tablet   Oral   Take 650 mg by mouth every 4 (four) hours as needed for pain.         Marland Kitchen amantadine (SYMMETREL) 100 MG capsule   Oral   Take 100 mg by mouth every morning.         Marland Kitchen amLODipine (NORVASC) 5 MG tablet   Oral   Take 5 mg by mouth every morning.         Marland Kitchen atorvastatin (LIPITOR) 10 MG tablet   Oral   Take 10 mg by mouth daily.         . busPIRone (BUSPAR) 5 MG tablet   Oral  Take 5 mg by mouth 2 (two) times daily.         . citalopram (CELEXA) 20 MG tablet   Oral   Take 20 mg by mouth every morning.         . cyclobenzaprine (FLEXERIL) 5 MG tablet   Oral   Take 5 mg by mouth 2 (two) times daily.         . divalproex (DEPAKOTE) 500 MG DR tablet   Oral   Take 500 mg by mouth 2 (two) times daily.         Marland Kitchen glimepiride (AMARYL) 1 MG tablet   Oral   Take 1 mg by mouth 2 (two) times daily.         . insulin aspart (NOVOLOG) 100 UNIT/ML injection   Subcutaneous   Inject 5 Units into the skin 3 (three) times daily as needed for high blood sugar (if CBG >150).         Marland Kitchen levothyroxine (SYNTHROID, LEVOTHROID) 50 MCG tablet   Oral   Take 50 mcg by mouth daily before breakfast.         . Memantine HCl ER (NAMENDA XR) 14 MG CP24   Oral   Take 14 mg by mouth every morning.         . pantoprazole (PROTONIX) 40 MG tablet   Oral   Take 40 mg by mouth every morning.         Marland Kitchen HYDROcodone-acetaminophen  (NORCO/VICODIN) 5-325 MG per tablet   Oral   Take 1 tablet by mouth every 4 (four) hours as needed for pain.   6 tablet   0    BP 159/77  Pulse 73  Temp(Src) 98.7 F (37.1 C) (Oral)  Resp 16  SpO2 97% Physical Exam  Nursing note and vitals reviewed. Constitutional: He is oriented to person, place, and time. He appears well-developed and well-nourished.  HENT:  Head: Normocephalic and atraumatic.  Small nonbleeding laceration to left posterior parietal scalp.  Eyes: EOM are normal.  Neck: Normal range of motion. Neck supple.  C-spine nontender.  C-spine cleared by Nexus criteria  Cardiovascular: Normal rate, regular rhythm, normal heart sounds and intact distal pulses.   Pulmonary/Chest: Effort normal and breath sounds normal. No respiratory distress.  Abdominal: Soft. He exhibits no distension. There is no tenderness.  Genitourinary: Rectum normal.  Musculoskeletal: Normal range of motion.  Neurological: He is alert and oriented to person, place, and time.  Skin: Skin is warm and dry.  Psychiatric: He has a normal mood and affect. Judgment normal.    ED Course   Procedures (including critical care time)  LACERATION REPAIR Performed by: Lyanne Co Consent: Verbal consent obtained. Risks and benefits: risks, benefits and alternatives were discussed Patient identity confirmed: provided demographic data Time out performed prior to procedure Prepped and Draped in normal sterile fashion Wound explored Laceration Location: Posterior scalp Laceration Length: 3 cm No Foreign Bodies seen or palpated Anesthesia: None  Irrigation method: syringe Amount of cleaning: standard Skin closure: Staples  Number of sutures or staples: 4  Technique: Staples  Patient tolerance: Patient tolerated the procedure well with no immediate complications.   Labs Reviewed - No data to display Ct Head Wo Contrast  05/10/2013   *RADIOLOGY REPORT*  Clinical Data: Blunt trauma post fall,  laceration.  CT HEAD WITHOUT CONTRAST  Technique:  Contiguous axial images were obtained from the base of the skull through the vertex without contrast.  Comparison: 04/11/2013  Findings: Atherosclerotic  and physiologic intracranial calcifications.  Old bilateral zygomatic arch fractures.  Metallic foreign body projects in the right frontal soft tissues at the level of the superior orbital rim, stable.  Old right frontal, parietal, and occipital infarcts. Diffuse parenchymal atrophy. Patchy areas of hypoattenuation in deep and periventricular white matter bilaterally. Negative for acute intracranial hemorrhage, mass lesion, acute infarction, midline shift, or mass-effect. Acute infarct may be inapparent on noncontrast CT. Ventricles and sulci symmetric. Bone windows demonstrate no focal lesion.  IMPRESSION:  1. Negative for bleed or other acute intracranial process.  2. Atrophy and nonspecific white matter changes. 3.  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   Original Report Authenticated By: D. Andria Rhein, MD   I personally reviewed the imaging tests through PACS system I reviewed available ER/hospitalization records through the EMR   1. Scalp laceration, initial encounter   2. Head injury, initial encounter     MDM  Minor head injury.  CT head given ongoing headache and a.  CT head without acute intracranial abnormality.  Laceration repaired with 4 staples.  Discharge home in good condition.  Head injury and infection warnings given.  Lyanne Co, MD 05/10/13 1302

## 2013-05-10 NOTE — ED Notes (Signed)
WUJ:[id number]<JY> Expected date:05/10/13<BR> Expected time:11:25 AM<BR> Means of arrival:Ambulance<BR> Comments:<BR> ?fall

## 2013-05-27 ENCOUNTER — Other Ambulatory Visit: Payer: Self-pay | Admitting: *Deleted

## 2013-05-27 MED ORDER — HYDROCODONE-ACETAMINOPHEN 5-325 MG PO TABS: ORAL_TABLET | ORAL | Status: DC

## 2013-06-17 ENCOUNTER — Non-Acute Institutional Stay (SKILLED_NURSING_FACILITY): Payer: Medicare Other | Admitting: Internal Medicine

## 2013-06-17 DIAGNOSIS — E3450 Androgen insensitivity syndrome, unspecified: Secondary | ICD-10-CM

## 2013-06-17 DIAGNOSIS — S2249XA Multiple fractures of ribs, unspecified side, initial encounter for closed fracture: Secondary | ICD-10-CM

## 2013-06-17 DIAGNOSIS — R269 Unspecified abnormalities of gait and mobility: Secondary | ICD-10-CM

## 2013-06-17 DIAGNOSIS — S2242XA Multiple fractures of ribs, left side, initial encounter for closed fracture: Secondary | ICD-10-CM
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
## 2013-06-19 NOTE — Progress Notes (Signed)
Patient ID: Alex Foster, male   DOB: 25-Nov-1945, 67 y.o.   MRN: [id number]  [place]   Allergies  Allergen Reactions  . Penicillins Other (See Comments)    Per [facility]    Chief Complaint  Patient presents with  . Medical Managment of Chronic Issues    HPI  He is being seen for the management of his chronic illnesses. There is no significant change in his status; there are no concerns being voiced by the nursing staff at this time.   Past Medical History  Diagnosis Date  . Hyperlipidemia   . COPD (chronic obstructive pulmonary disease)     Active smoker, has oxygen for nightime use.  Cannot tolerate using mask.  . Low back pain     Goes to [facility]  . H/O alcohol abuse   . Myocardial infarction   . Hypertension   . Diabetes mellitus   . 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 Extensive facial fractures 05/31/2012  . MRSA (methicillin resistant staphylococcus aureus) pneumonia 06/02/2012    Past Surgical History  Procedure Laterality Date  . Transthoracic echocardiogram  01/2010    EF 60%, mild LVH, mild RV dilation with normal systolic function  . Nm myoview ltd  01/2010    Lexixan myoview: EF 67%, no ischemia or infarction  . Abi  01/2006    0.93 Normal  . Anteriolisthesis  06/26/2006  . Electrocardiogram  01/02/2006    1st degree block  . Cataract extraction, bilateral      per patient  . Percutaneous tracheostomy  11/04/2012    Procedure: PERCUTANEOUS TRACHEOSTOMY;  Surgeon: Cherylynn Ridges, MD;  Location: [facility] OR;  Service: General;  Laterality: N/A;    Filed Vitals:   02/07/13 1456  BP: 126/72  Pulse: 98  Height: 5\' 11"  (1.803 m)  Weight: 197 lb (89.359 kg)    MEDICATIONS namenda xr 14 mg daily Amantadine 100 mg daily norvasc 5 mg daily celexa 20 mg daily protonix 40 mg daily buspar 5 mg twice daily Flexeril 5 mg twice daily depakote 500 mg twice daily   SIGNIFICANT STUDIES:   12-27-12: Right elbow x-ray: intercondylar nondisplaced  fracture  LABS REVIEWED:   12-04-12: wbc 5.7;  hgb 11.0; hct 32.8; mcv 84.5;plt 122; glucose 54; bun 24; creat 1.26; k+3.8; na++ 141 Liver normal albumin 3.2 tsh 8.353   Review of Systems  Constitutional: Negative for malaise/fatigue.  Respiratory: Negative for cough and sputum production.   Cardiovascular: Negative for chest pain.  Gastrointestinal: Negative for heartburn and abdominal pain.  Musculoskeletal: Negative for myalgias and joint pain.  Skin: Negative.   Neurological: Negative for dizziness and headaches.  Psychiatric/Behavioral: Negative for depression. The patient is not nervous/anxious.      Physical Exam  Constitutional: He appears well-developed and well-nourished.  Overweight   Neck: Neck supple. No JVD present.  Cardiovascular: Normal rate, regular rhythm and intact distal pulses.   Respiratory: Effort normal and breath sounds normal. No respiratory distress.  GI: Soft. Bowel sounds are normal. He exhibits no distension. There is no tenderness.  Musculoskeletal: Normal range of motion. He exhibits no edema.  Neurological: He is alert.  Skin: Skin is warm and dry.     ASSESSMENT/PLAN:  1. Hypertension: is stable will continue norvasc 5 mg daily and will monitor  2. Hypothyroidism: his tsh is elevated at 8.353 will being synthroid 50 mcg daily will check tsh in 6 weeks and will monitor his status   3. Copd:  Will  not make changes will monitor his status   4. Gerd: will continue protonix 40 mg daily   5. Low back pain: is stable will continue flexeril 5 mg twice daily and will monitor  6. Dementia: is without change in status will continue namenda xr 14 mg daily and will monitor   7. Anxiety and depression: will continue buspar 5 mg twice daily and celexa 20 mg; his seroquel was stopped; will continue his amantadine 100 mg daily for eps. Will monitor his status.

## 2013-07-14 ENCOUNTER — Non-Acute Institutional Stay (SKILLED_NURSING_FACILITY): Payer: Medicare Other | Admitting: Adult Health

## 2013-07-14 DIAGNOSIS — F411 Generalized anxiety disorder: Secondary | ICD-10-CM

## 2013-07-14 DIAGNOSIS — I10 Essential (primary) hypertension: Secondary | ICD-10-CM

## 2013-07-14 DIAGNOSIS — F03918 Unspecified dementia, unspecified severity, with other behavioral disturbance: Secondary | ICD-10-CM

## 2013-07-14 DIAGNOSIS — R1310 Dysphagia, unspecified: Secondary | ICD-10-CM

## 2013-07-14 DIAGNOSIS — M549 Dorsalgia, unspecified: Secondary | ICD-10-CM

## 2013-07-14 DIAGNOSIS — R609 Edema, unspecified: Secondary | ICD-10-CM

## 2013-07-14 DIAGNOSIS — E039 Hypothyroidism, unspecified: Secondary | ICD-10-CM

## 2013-07-14 DIAGNOSIS — F0391 Unspecified dementia with behavioral disturbance: Secondary | ICD-10-CM

## 2013-07-14 DIAGNOSIS — IMO0002 Reserved for concepts with insufficient information to code with codable children: Secondary | ICD-10-CM

## 2013-07-14 DIAGNOSIS — K219 Gastro-esophageal reflux disease without esophagitis: Secondary | ICD-10-CM

## 2013-07-14 DIAGNOSIS — E1165 Type 2 diabetes mellitus with hyperglycemia: Secondary | ICD-10-CM

## 2013-07-14 DIAGNOSIS — F419 Anxiety disorder, unspecified: Secondary | ICD-10-CM

## 2013-07-21 ENCOUNTER — Encounter: Payer: Self-pay | Admitting: Adult Health

## 2013-07-21 DIAGNOSIS — E039 Hypothyroidism, unspecified: Secondary | ICD-10-CM | POA: Insufficient documentation

## 2013-07-21 DIAGNOSIS — E1165 Type 2 diabetes mellitus with hyperglycemia: Secondary | ICD-10-CM

## 2013-07-21 DIAGNOSIS — IMO0002 Reserved for concepts with insufficient information to code with codable children: Secondary | ICD-10-CM

## 2013-07-21 DIAGNOSIS — R1310 Dysphagia, unspecified: Secondary | ICD-10-CM

## 2013-07-21 DIAGNOSIS — F419 Anxiety disorder, unspecified: Secondary | ICD-10-CM | POA: Insufficient documentation

## 2013-07-21 DIAGNOSIS — M549 Dorsalgia, unspecified: Secondary | ICD-10-CM | POA: Insufficient documentation

## 2013-07-21 HISTORY — DX: Type 2 diabetes mellitus with hyperglycemia: E11.65

## 2013-07-21 HISTORY — DX: Dysphagia, unspecified: R13.10

## 2013-07-21 HISTORY — DX: Reserved for concepts with insufficient information to code with codable children: IMO0002

## 2013-07-21 NOTE — Progress Notes (Signed)
Patient ID: Alex Foster, male   DOB: 1945-10-30, 67 y.o.   MRN: [id number]  [place] Allergies  Allergen Reactions  . Penicillins Other (See Comments)    Per [facility]    Chief Complaint  Patient presents with  . Medical Managment of Chronic Issues    HPI: He is being seen for the management of his chronic illnesses there are no concerns being voiced by the nursing staff at this time. He tells me that he is feeling good. There is no significant change in his status at this time.   Past Medical History  Diagnosis Date  . Hyperlipidemia   . COPD (chronic obstructive pulmonary disease)     Active smoker, has oxygen for nightime use.  Cannot tolerate using mask.  . Low back pain     Goes to [facility]  . H/O alcohol abuse   . Myocardial infarction   . Hypertension   . Diabetes mellitus   . 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 Extensive facial fractures 05/31/2012  . MRSA (methicillin resistant staphylococcus aureus) pneumonia 06/02/2012    Past Surgical History  Procedure Laterality Date  . Transthoracic echocardiogram  01/2010    EF 60%, mild LVH, mild RV dilation with normal systolic function  . Nm myoview ltd  01/2010    Lexixan myoview: EF 67%, no ischemia or infarction  . Abi  01/2006    0.93 Normal  . Anteriolisthesis  06/26/2006  . Electrocardiogram  01/02/2006    1st degree block  . Cataract extraction, bilateral      per patient  . Percutaneous tracheostomy  11/04/2012    Procedure: PERCUTANEOUS TRACHEOSTOMY;  Surgeon: Cherylynn Ridges, MD;  Location: [facility] OR;  Service: General;  Laterality: N/A;    VITAL SIGNS BP 110/68  Pulse 84  Ht 5\' 11"  (1.803 m)  Wt 202 lb (91.627 kg)  BMI 28.19 kg/m2   Patient's Medications  New Prescriptions   No medications on file  Previous Medications   ACETAMINOPHEN (TYLENOL) 325 MG TABLET    Take 650 mg by mouth every 4 (four) hours as needed for pain.   AMANTADINE (SYMMETREL) 100 MG CAPSULE    Take 100 mg by mouth every  morning.   AMLODIPINE (NORVASC) 5 MG TABLET    Take 5 mg by mouth every morning.   ATORVASTATIN (LIPITOR) 10 MG TABLET    Take 10 mg by mouth daily.   BUSPIRONE (BUSPAR) 5 MG TABLET    Take 5 mg by mouth 2 (two) times daily.   CYCLOBENZAPRINE (FLEXERIL) 5 MG TABLET    Take 5 mg by mouth 2 (two) times daily.   DIVALPROEX (DEPAKOTE) 500 MG DR TABLET    Take 500 mg by mouth 2 (two) times daily.   GLIMEPIRIDE (AMARYL) 1 MG TABLET    Take 1 mg by mouth 2 (two) times daily.   INSULIN ASPART (NOVOLOG) 100 UNIT/ML INJECTION    Inject 5 Units into the skin 3 (three) times daily as needed for high blood sugar (if CBG >150).   LEVOTHYROXINE (SYNTHROID, LEVOTHROID) 50 MCG TABLET    Take 50 mcg by mouth daily before breakfast.   MEMANTINE HCL ER (NAMENDA XR) 14 MG CP24    Take 14 mg by mouth every morning.   PANTOPRAZOLE (PROTONIX) 40 MG TABLET    Take 40 mg by mouth every morning.  Modified Medications   No medications on file  Discontinued Medications   No medications on file  SIGNIFICANT DIAGNOSTIC EXAMS    LABS REVIEWED:  11-06-12: tsh 1.043 11-27-12: wbc 4.7; hgb 11.5; hct 35.6 ;mcv 86.6 plt 180 11-29-12: glucose 103; bun 25; creat 1.01; k+4.1;na++139   Review of Systems  Constitutional: Negative.   Respiratory: Negative for cough.   Cardiovascular: Negative for chest pain.  Gastrointestinal: Negative for heartburn and abdominal pain.  Musculoskeletal: Negative for back pain.  Skin: Negative.   Neurological: Negative for headaches.  Psychiatric/Behavioral: Negative for depression. The patient is not nervous/anxious.     Physical Exam  Constitutional: He appears well-developed and well-nourished.  overweight  Neck: Neck supple. No JVD present.  Cardiovascular: Normal rate, regular rhythm and intact distal pulses.   Respiratory: Effort normal and breath sounds normal. No respiratory distress. He has no wheezes.  GI: Soft. Bowel sounds are normal. He exhibits no distension. There is no  tenderness.  Musculoskeletal: Normal range of motion. He exhibits no edema.  Neurological: He is alert.  Skin: Skin is warm and dry.      ASSESSMENT/ PLAN:  1. Hypertension: is stable will continue norvasc 5 mg daily and will monitor his status   2. Diabetes: he is stable will continue amaryl 1 mg daily and novolog 5 units prior to meals for cbg >=150   3. Hypothyroidism: will continue synthroid 50 mcg daily and will monitor  4. Gerd: will continue protonix 40 mg daily and will monitor  5. Dysphagia: no signs of aspiration present; will continue nectar thick liquids and monitor   6. Dementia: without change in his overall status; will continue namenda xr 14 mg daily   7. Low back pain: he is presently with pain controlled; will continue flexeril 5 mg twice daily and aleve twice daily and will monitor   8. Anxiety: he is emotionally stable; will continue buspar 5 mg twice daily and depakote 1 gm twice daily to help stabilize mood; with amantadine 100 mg daily to help control eps effects and will monitor his status.

## 2013-07-23 NOTE — Progress Notes (Signed)
Patient ID: Alex Foster, male   DOB: 15-Jul-1946, 67 y.o.   MRN: [id number]  [place]  Allergies  Allergen Reactions  . Penicillins Other (See Comments)    Per [facility]    Chief Complaint  Patient presents with  . Medical Managment of Chronic Issues    HPI  He is being seen for the management of his chronic illnesses.  He has had 2 falls in the recent past without injuries present. There are no other issues present for him he states he is feeling well. He denies any dizziness or loss of balance at this time.   Past Medical History  Diagnosis Date  . Hyperlipidemia   . COPD (chronic obstructive pulmonary disease)     Active smoker, has oxygen for nightime use.  Cannot tolerate using mask.  . Low back pain     Goes to [facility]  . H/O alcohol abuse   . Myocardial infarction   . Hypertension   . Diabetes mellitus   . 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 Extensive facial fractures 05/31/2012  . MRSA (methicillin resistant staphylococcus aureus) pneumonia 06/02/2012  . Cardiac arrest 06/02/2012  . Mobitz (type) II atrioventricular block 06/04/2012  . DVT of axillary vein, acute right 08/03/2012    Dx 06/17/2012 by Duplex   . COPD 05/08/2007    Pt continues to smoke.  He does not desire smoking cessation.  Currently he smokes about 1 1/2 ppd.     . Acute respiratory failure with hypoxia 06/02/2012  . Tracheobronchitis, MRSA 06/02/2012  . Pleural effusion 06/09/2012  . Type II or unspecified type diabetes mellitus with unspecified complication, uncontrolled   . Hepatic encephalopathy 04/12/2011     Past Surgical History  Procedure Laterality Date  . Transthoracic echocardiogram  01/2010    EF 60%, mild LVH, mild RV dilation with normal systolic function  . Nm myoview ltd  01/2010    Lexixan myoview: EF 67%, no ischemia or infarction  . Abi  01/2006    0.93 Normal  . Anteriolisthesis  06/26/2006  . Electrocardiogram  01/02/2006    1st degree block  . Cataract extraction, bilateral       per patient  . Percutaneous tracheostomy  11/04/2012    Procedure: PERCUTANEOUS TRACHEOSTOMY;  Surgeon: Cherylynn Ridges, MD;  Location: [facility] OR;  Service: General;  Laterality: N/A;    Current Outpatient Prescriptions on File Prior to Visit  Medication Sig Dispense Refill  . amantadine (SYMMETREL) 100 MG capsule Take 100 mg by mouth every morning.      Marland Kitchen amLODipine (NORVASC) 5 MG tablet Take 5 mg by mouth every morning.      . busPIRone (BUSPAR) 5 MG tablet Take 5 mg by mouth 2 (two) times daily.      . citalopram (CELEXA) 20 MG tablet Take 20 mg by mouth every morning.      . cyclobenzaprine (FLEXERIL) 5 MG tablet Take 5 mg by mouth 2 (two) times daily.      . divalproex (DEPAKOTE) 500 MG DR tablet Take 500 mg by mouth 2 (two) times daily.      Marland Kitchen glimepiride (AMARYL) 1 MG tablet Take 1 mg by mouth 2 (two) times daily.      . pantoprazole (PROTONIX) 40 MG tablet Take 40 mg by mouth every morning.       No current facility-administered medications on file prior to visit.    Filed Vitals:   04/18/13 1130  BP: 136/74  Pulse:  78  Height: 5\' 11"  (1.803 m)  Weight: 233 lb (105.688 kg)     SIGNIFICANT DIAGNOSTIC EXAMS    LABS REVIEWED:   11-06-12: tsh 1.043 11-27-12: wbc 4.7; hgb 11.5; hct 35.6 ;mcv 86.6 plt 180 11-29-12: glucose 103; bun 25; creat 1.01; k+4.1;na++139   03-10-13: hgb a1c 5.5; Depakote 38.0 03-21-13: tsh 3.691   Review of Systems  Constitutional: Negative.   Respiratory: Negative for cough.   Cardiovascular: Negative for chest pain.  Gastrointestinal: Negative for heartburn and abdominal pain.  Musculoskeletal: Negative for back pain.  Skin: Negative.   Neurological: Negative for headaches.  Psychiatric/Behavioral: Negative for depression. The patient is not nervous/anxious.     Physical Exam  Constitutional: He appears well-developed and well-nourished.  overweight  Neck: Neck supple. No JVD present.  Cardiovascular: Normal rate, regular rhythm and intact  distal pulses.   Respiratory: Effort normal and breath sounds normal. No respiratory distress. He has no wheezes.  GI: Soft. Bowel sounds are normal. He exhibits no distension. There is no tenderness.  Musculoskeletal: Normal range of motion. He exhibits no edema.  Neurological: He is alert.  Skin: Skin is warm and dry.      ASSESSMENT/ PLAN:  1. Hypertension: is stable will continue norvasc 5 mg daily and will monitor his status   2. Diabetes: he is stable will continue amaryl 1 mg twice daily   3. Hypothyroidism: will continue synthroid 50 mcg daily and will monitor  4. Gerd: will continue protonix 40 mg daily and will monitor  5. Dysphagia: no signs of aspiration present; will continue nectar thick liquids and monitor   6. Dementia: without change in his overall status; will continue namenda xr 14 mg daily   7. Low back pain: he is presently with pain controlled; will continue flexeril 5 mg twice daily and aleve twice daily and will monitor   8. Anxiety: he is emotionally stable; will continue buspar 5 mg twice daily and depakote 500 mg twice daily to help stabilize mood; with amantadine 100 mg daily to help control eps effects and will monitor his status.

## 2013-07-25 ENCOUNTER — Non-Acute Institutional Stay (SKILLED_NURSING_FACILITY): Payer: Medicare Other | Admitting: Internal Medicine

## 2013-07-25 DIAGNOSIS — M545 Low back pain: Secondary | ICD-10-CM

## 2013-07-25 DIAGNOSIS — L0390 Cellulitis, unspecified: Secondary | ICD-10-CM

## 2013-07-25 DIAGNOSIS — I10 Essential (primary) hypertension: Secondary | ICD-10-CM

## 2013-07-25 DIAGNOSIS — R609 Edema, unspecified: Secondary | ICD-10-CM

## 2013-07-25 DIAGNOSIS — IMO0001 Reserved for inherently not codable concepts without codable children: Secondary | ICD-10-CM

## 2013-07-25 DIAGNOSIS — E039 Hypothyroidism, unspecified: Secondary | ICD-10-CM

## 2013-07-25 DIAGNOSIS — E1165 Type 2 diabetes mellitus with hyperglycemia: Secondary | ICD-10-CM

## 2013-07-25 DIAGNOSIS — K219 Gastro-esophageal reflux disease without esophagitis: Secondary | ICD-10-CM

## 2013-07-25 DIAGNOSIS — S2242XD Multiple fractures of ribs, left side, subsequent encounter for fracture with routine healing: Secondary | ICD-10-CM

## 2013-07-25 DIAGNOSIS — L0291 Cutaneous abscess, unspecified: Secondary | ICD-10-CM

## 2013-07-25 NOTE — Progress Notes (Signed)
Patient ID: Alex Foster, male   DOB: 1946-09-14, 67 y.o.   MRN: [id number]        This is a routine visit.  Facilities [facility].  Level of care skilled.             Chief Complaint   Patient presents with   .  Medical Managment of Chronic Issues        HPI   He is being seen for the management of his chronic illnesses.  He has had  falls in the recent past without injuries present--apparently this is now less frequent-he did have a visit to the ER it appears last month CT scan of head  was negative for anything acute he does have some history of hip fractures these appear to be asymptomatic currently. There are no other  acute issues present for him he states he is feeling well. He denies any dizziness or loss of balance at this time. He does have a small raised area slightly tender  area on his right elbow that has developed over the past several days I do note he did have low dose Lasix started about 10 days ago for lower extremity edema apparently this is improving     Past Medical History   Diagnosis  Date   .  Hyperlipidemia     .  COPD (chronic obstructive pulmonary disease)         Active smoker, has oxygen for nightime use.  Cannot tolerate using mask.   .  Low back pain         Goes to [facility]   .  H/O alcohol abuse     .  Myocardial infarction     .  Hypertension     .  Diabetes mellitus     .  TBI (traumatic brain injury)  05/31/2012   .  Extensive facial fractures  05/31/2012   .  MRSA (methicillin resistant staphylococcus aureus) pneumonia  06/02/2012   .  Cardiac arrest  06/02/2012   .  Mobitz (type) II atrioventricular block  06/04/2012   .  DVT of axillary vein, acute right  08/03/2012       Dx 06/17/2012 by Duplex    .  COPD  05/08/2007       Pt continues to smoke.  He does not desire smoking cessation.  Currently he smokes about 1 1/2 ppd.      .  Acute respiratory failure with hypoxia  06/02/2012   .  Tracheobronchitis, MRSA  06/02/2012   .   Pleural effusion  06/09/2012   .  Type II or unspecified type diabetes mellitus with unspecified complication, uncontrolled     .  Hepatic encephalopathy  04/12/2011           Past Surgical History   Procedure  Laterality  Date   .  Transthoracic echocardiogram    01/2010       EF 60%, mild LVH, mild RV dilation with normal systolic function   .  Nm myoview ltd    01/2010       Lexixan myoview: EF 67%, no ischemia or infarction   .  Abi    01/2006       0.93 Normal   .  Anteriolisthesis    06/26/2006   .  Electrocardiogram    01/02/2006       1st degree block   .  Cataract extraction, bilateral  per patient   .  Percutaneous tracheostomy    11/04/2012       Procedure: PERCUTANEOUS TRACHEOSTOMY;  Surgeon: Cherylynn Ridges, MD;  Location: [facility] OR;  Service: General;  Laterality: N/A;          .     Medications have been reviewed per MAR include.  Amantadine 100 mg daily.  Norvasc 5 mg daily.  Celexa 20 mg daily.  Protonic 40 mg daily.  Synthroid 50 mcg daily.  BuSpar 5 mg twice a day.  Depakote 500 mg twice a day.  Namenda 5 mg twice a day.  Lipitor 10 mg daily.  Lasix 20 mg daily.  Flexeril 5 mg by mouth twice a day when necessary 2  Amaryl 1 mg twice a day.  Norco 5/325  one tab every 4 hours when necessary          SIGNIFICANT DIAGNOSTIC EXAMS       LABS REVIEWED 06/18/2013.  WBC 5.7 hemoglobin 12.6 platelets 133.  Sodium 140 potassium 4.5 BUN 28 creatinine 1.39.  Cholesterol 141 triglycerides 85 HDL 45 LDL 79.  Hemoglobin A1c 5.7.  :   11-06-12: tsh 1.043 11-27-12: wbc 4.7; hgb 11.5; hct 35.6 ;mcv 86.6 plt 180 11-29-12: glucose 103; bun 25; creat 1.01; k+4.1;na++139   03-10-13: hgb a1c 5.5; Depakote 38.0 03-21-13: tsh 3.691    Review of Systems   Constitutional: Negative--fever or chills  . Skin-has noted a small red slightly tender area right elbow Head ears nose and throat-does not complaining of any sore throat difficulty  swallowing or nasal discharge Eyes-has prescription lenses denies any visual changes     Respiratory: Negative for cough--or shortness of breath.    Cardiovascular: Negative for chest pain.   Gastrointestinal: Negative for heartburn and abdominal pain.   Musculoskeletal: Negative for back pain.   S.    Neurological: Negative for headaches.   Psychiatric/Behavioral: Negative for depression. The patient is not nervous/anxious.       Physical Exam Temperature 97.6 pulse 66 respirations 22 blood pressure systolics variable 120s to 140s diastolics 60-80's --- weight is 250  Constitutional: He appears well-developed and well-nourished.  overweight   Eyes-has prescription lenses pupils equal round reactive to light sclera and conjunctiva are clear visual acuity appears intact Oropharynx clear mucous membranes moist   Cardiovascular: Normal rate, regular rhythm--distant heart sounds-- --her staff he has some mild lower extremity edema this is improved.    Respiratory: Effort normal and breath sounds normal. No respiratory distress. He has no wheezes.   GI: Soft. Bowel sounds are normal. He exhibits no distension. There is no tenderness.  Musculoskeletal: Normal range of motion. He exhibits no edema--emulation wheelchair.  Neurological: He is alert.   Skin: Skin is warm and dry--right elbow there is a small erythematous area that is raised slightly tender to palpation there is no drainage or surrounding erythema Psych- is grossly alert and oriented pleasant and appropriate.          ASSESSMENT/ PLAN:   1. Hypertension: is  relatively stable will continue norvasc 5 mg daily and will monitor his status    2. Diabetes: he is fairly  Stable--a.m. sugars appear to run largely in the low to mid 100s at 4:30 PM more variability ranging from 109-275 but generally in the mid 100s- will continue amaryl 1 mg twice daily --recent hemoglobin A1c was satisfactory   3. Hypothyroidism: will  continue synthroid 50 mcg daily and will update TSH   4. Gerd:  will continue protonix 40 mg daily and will monitor   5. Dysphagia: no signs of aspiration present; will continue nectar thick liquids and monitor    6. Dementia: without change in his overall status; will continue namenda     7. Low back pain: he is presently with pain controlled on; current medications    8. Anxiety: he is emotionally stable; will continue buspar 5 mg twice daily and depakote 500 mg twice daily to help stabilize mood; with amantadine 100 mg daily to help control eps effects and will monitor his status.   #9-hyperlipidemia-continues on statin-recent lipid panel appeared satisfactory will update liver function tests.  #10-edema started on low-dose Lasix will need to keep an eye on his renal function and electrolytes metabolic panel has been ordered for next week--it looks like his weight has been steadily increasing and went up to 250 on October 14 subsequently Lasix has been started will order weekly weights to keep an eye on this clinically apparently he is doing better with less edema  #11-question cellulitis-cyst-secondary to right elbow issues Will start doxycycline 100 mg twice a day for 7 days-culture any drainage monitor for any increased erythema--also will order probiotic for 7 days  #12 past history of rib fractures-this appears to be clinically stable on current pain medications. He takes his Norco once every other day.  [id number] note 35 minutes spent assessing patient-discussing his status with nursing staff reviewing  his records-and coordinating and formulating a plan of care for numerous diagnoses

## 2013-07-29 ENCOUNTER — Encounter: Payer: Self-pay | Admitting: Adult Health

## 2013-07-29 MED ORDER — FUROSEMIDE 20 MG PO TABS: 20.0000 mg | ORAL_TABLET | Freq: Every day | ORAL | Status: DC

## 2013-07-29 NOTE — Progress Notes (Signed)
Patient ID: Alex Foster, male   DOB: September 22, 1946, 67 y.o.   MRN: [id number]  [place]  Allergies  Allergen Reactions  . Penicillins Other (See Comments)    Per [facility]     Chief Complaint  Patient presents with  . Medical Managment of Chronic Issues    HPI: He is being seen for the management of his chronic illnesses. There are no concerns being voiced by the nursing staff at this time. He is complaining of increased lower extremity edema present. He is denying any chest pain or shortness of breath present.   Past Medical History  Diagnosis Date  . Hyperlipidemia   . COPD (chronic obstructive pulmonary disease)     Active smoker, has oxygen for nightime use.  Cannot tolerate using mask.  . Low back pain     Goes to [facility]  . H/O alcohol abuse   . Myocardial infarction   . Hypertension   . Diabetes mellitus   . 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 Extensive facial fractures 05/31/2012  . MRSA (methicillin resistant staphylococcus aureus) pneumonia 06/02/2012  . Cardiac arrest 06/02/2012  . Mobitz (type) II atrioventricular block 06/04/2012  . DVT of axillary vein, acute right 08/03/2012    Dx 06/17/2012 by Duplex   . COPD 05/08/2007    Pt continues to smoke.  He does not desire smoking cessation.  Currently he smokes about 1 1/2 ppd.     . Acute respiratory failure with hypoxia 06/02/2012  . Tracheobronchitis, MRSA 06/02/2012  . Pleural effusion 06/09/2012  . Type II or unspecified type diabetes mellitus with unspecified complication, uncontrolled 07/21/2013  . Hepatic encephalopathy 04/12/2011    Past Surgical History  Procedure Laterality Date  . Transthoracic echocardiogram  01/2010    EF 60%, mild LVH, mild RV dilation with normal systolic function  . Nm myoview ltd  01/2010    Lexixan myoview: EF 67%, no ischemia or infarction  . Abi  01/2006    0.93 Normal  . Anteriolisthesis  06/26/2006  . Electrocardiogram  01/02/2006    1st degree block  . Cataract extraction,  bilateral      per patient  . Percutaneous tracheostomy  11/04/2012    Procedure: PERCUTANEOUS TRACHEOSTOMY;  Surgeon: Cherylynn Ridges, MD;  Location: [facility] OR;  Service: General;  Laterality: N/A;    VITAL SIGNS BP 118/68  Pulse 70  Ht 5\' 11"  (1.803 m)  Wt 250 lb (113.399 kg)  BMI 34.88 kg/m2   Patient's Medications  New Prescriptions   No medications on file  Previous Medications   ACETAMINOPHEN (TYLENOL) 325 MG TABLET    Take 650 mg by mouth every 4 (four) hours as needed for pain.   AMANTADINE (SYMMETREL) 100 MG CAPSULE    Take 100 mg by mouth every morning.   AMLODIPINE (NORVASC) 5 MG TABLET    Take 5 mg by mouth every morning.   ATORVASTATIN (LIPITOR) 10 MG TABLET    Take 10 mg by mouth daily.   BUSPIRONE (BUSPAR) 5 MG TABLET    Take 5 mg by mouth 2 (two) times daily.   CITALOPRAM (CELEXA) 20 MG TABLET    Take 20 mg by mouth every morning.   CYCLOBENZAPRINE (FLEXERIL) 5 MG TABLET    Take 5 mg by mouth 2 (two) times daily.   DIVALPROEX (DEPAKOTE) 500 MG DR TABLET    Take 500 mg by mouth 2 (two) times daily.   GLIMEPIRIDE (AMARYL) 1 MG TABLET  Take 1 mg by mouth 2 (two) times daily.   HYDROCODONE-ACETAMINOPHEN (NORCO/VICODIN) 5-325 MG PER TABLET    Take one tablet by mouth every 4 hours as needed for pain   INSULIN ASPART (NOVOLOG) 100 UNIT/ML INJECTION    Inject 5 Units into the skin 3 (three) times daily as needed for high blood sugar (if CBG >150).   LEVOTHYROXINE (SYNTHROID, LEVOTHROID) 50 MCG TABLET    Take 50 mcg by mouth daily before breakfast.   MEMANTINE HCL ER (NAMENDA XR) 14 MG CP24    Take 14 mg by mouth every morning.   PANTOPRAZOLE (PROTONIX) 40 MG TABLET    Take 40 mg by mouth every morning.  Modified Medications   No medications on file  Discontinued Medications   No medications on file    SIGNIFICANT DIAGNOSTIC EXAMS   06-17-13: left rib x-ray: age indeterminate nondisplaced 9-10 left rib fracture anterolateral portions. Healed fracture right rib cage   LABS  REVIEWED:   11-06-12: tsh 1.043 11-27-12: wbc 4.7; hgb 11.5; hct 35.6 ;mcv 86.6 plt 180 11-29-12: glucose 103; bun 25; creat 1.01; k+4.1;na++139   03-10-13: hgb a1c 5.5; Depakote 38.0 03-21-13: tsh 3.691  06-18-13; wbc 5.7; hgb 12.6; hct 37.1; mcv 86.9 ;plt 133; glucose 93; bun 28; creat 1.39;k+4.5; na++ 140; chol 141;ldl 79; trig 85; hgb a1c 5.7      Review of Systems   Constitutional: Negative.    Respiratory: Negative for cough.    Cardiovascular: Negative for chest pain. POSITIVE FOR EDEMA  Gastrointestinal: Negative for heartburn and abdominal pain.   Musculoskeletal: Negative for back pain.   Skin: Negative.    Neurological: Negative for headaches.   Psychiatric/Behavioral: Negative for depression. The patient is not nervous/anxious.     Physical Exam  Constitutional: He appears well-developed and well-nourished.  overweight  Neck: Neck supple. No JVD present.   Cardiovascular: Normal rate, regular rhythm and intact distal pulses.   2+ EDEMA PRESENT  Respiratory: Effort normal and breath sounds normal. No respiratory distress. He has no wheezes.   GI: Soft. Bowel sounds are normal. He exhibits no distension. There is no tenderness.  Musculoskeletal: Normal range of motion. He exhibits no edema.  Neurological: He is alert.   Skin: Skin is warm and dry.      ASSESSMENT/ PLAN:  1. Hypertension: is stable will continue norvasc 5 mg daily and will monitor his status   2. Diabetes: he is stable will continue amaryl 1 mg twice daily   3. Hypothyroidism: will continue synthroid 50 mcg daily and will monitor  4. Gerd: will continue protonix 40 mg daily and will monitor  5. Dysphagia: no signs of aspiration present; will continue nectar thick liquids and monitor   6. Dementia: without change in his overall status; will continue namenda xr 14 mg daily   7. Low back pain: he is presently with pain controlled; will continue flexeril 5 mg twice daily and aleve twice daily and will  monitor   8. Anxiety: he is emotionally stable; will continue buspar 5 mg twice daily and depakote 500 mg twice daily to help stabilize mood; with amantadine 100 mg daily to help control eps effects and will monitor his status.  9. Edema: will begin lasix 20 mg daily will monitor edema and will check bmp in 2 weeks.

## 2013-08-15 ENCOUNTER — Non-Acute Institutional Stay (SKILLED_NURSING_FACILITY): Payer: Medicare Other | Admitting: Adult Health

## 2013-08-15 DIAGNOSIS — R1310 Dysphagia, unspecified: Secondary | ICD-10-CM

## 2013-08-15 DIAGNOSIS — F411 Generalized anxiety disorder: Secondary | ICD-10-CM

## 2013-08-15 DIAGNOSIS — F419 Anxiety disorder, unspecified: Secondary | ICD-10-CM

## 2013-08-15 DIAGNOSIS — E1165 Type 2 diabetes mellitus with hyperglycemia: Secondary | ICD-10-CM

## 2013-08-15 DIAGNOSIS — E039 Hypothyroidism, unspecified: Secondary | ICD-10-CM

## 2013-08-15 DIAGNOSIS — R609 Edema, unspecified: Secondary | ICD-10-CM

## 2013-08-15 DIAGNOSIS — M549 Dorsalgia, unspecified: Secondary | ICD-10-CM

## 2013-08-15 DIAGNOSIS — I10 Essential (primary) hypertension: Secondary | ICD-10-CM

## 2013-08-15 DIAGNOSIS — M545 Low back pain: Secondary | ICD-10-CM

## 2013-08-15 DIAGNOSIS — F0391 Unspecified dementia with behavioral disturbance: Secondary | ICD-10-CM

## 2013-08-15 DIAGNOSIS — K219 Gastro-esophageal reflux disease without esophagitis: Secondary | ICD-10-CM

## 2013-08-19 ENCOUNTER — Encounter: Payer: Self-pay | Admitting: Adult Health

## 2013-08-19 MED ORDER — FUROSEMIDE 20 MG PO TABS: 40.0000 mg | ORAL_TABLET | Freq: Every day | ORAL | Status: DC

## 2013-08-19 MED ORDER — LISINOPRIL 10 MG PO TABS: 10.0000 mg | ORAL_TABLET | Freq: Every day | ORAL | Status: DC
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
## 2013-09-29 ENCOUNTER — Non-Acute Institutional Stay (SKILLED_NURSING_FACILITY): Payer: Medicare Other | Admitting: Nurse Practitioner

## 2013-09-29 ENCOUNTER — Other Ambulatory Visit: Payer: Self-pay | Admitting: *Deleted

## 2013-09-29 DIAGNOSIS — J4489 Other specified chronic obstructive pulmonary disease: Secondary | ICD-10-CM

## 2013-09-29 DIAGNOSIS — F411 Generalized anxiety disorder: Secondary | ICD-10-CM

## 2013-09-29 DIAGNOSIS — R609 Edema, unspecified: Secondary | ICD-10-CM

## 2013-09-29 DIAGNOSIS — K219 Gastro-esophageal reflux disease without esophagitis: Secondary | ICD-10-CM

## 2013-09-29 DIAGNOSIS — IMO0002 Reserved for concepts with insufficient information to code with codable children: Secondary | ICD-10-CM

## 2013-09-29 DIAGNOSIS — E785 Hyperlipidemia, unspecified: Secondary | ICD-10-CM

## 2013-09-29 DIAGNOSIS — E1165 Type 2 diabetes mellitus with hyperglycemia: Secondary | ICD-10-CM

## 2013-09-29 DIAGNOSIS — F0391 Unspecified dementia with behavioral disturbance: Secondary | ICD-10-CM

## 2013-09-29 DIAGNOSIS — F419 Anxiety disorder, unspecified: Secondary | ICD-10-CM

## 2013-09-29 DIAGNOSIS — F03918 Unspecified dementia, unspecified severity, with other behavioral disturbance: Secondary | ICD-10-CM

## 2013-09-29 DIAGNOSIS — J449 Chronic obstructive pulmonary disease, unspecified: Secondary | ICD-10-CM

## 2013-09-29 DIAGNOSIS — M545 Low back pain, unspecified: Secondary | ICD-10-CM

## 2013-09-29 DIAGNOSIS — E039 Hypothyroidism, unspecified: Secondary | ICD-10-CM

## 2013-09-29 DIAGNOSIS — I10 Essential (primary) hypertension: Secondary | ICD-10-CM

## 2013-09-29 MED ORDER — HYDROCODONE-ACETAMINOPHEN 5-325 MG PO TABS: ORAL_TABLET | ORAL | Status: DC
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
## 2013-11-25 ENCOUNTER — Non-Acute Institutional Stay (SKILLED_NURSING_FACILITY): Payer: PRIVATE HEALTH INSURANCE | Admitting: Internal Medicine

## 2013-11-25 DIAGNOSIS — IMO0002 Reserved for concepts with insufficient information to code with codable children: Secondary | ICD-10-CM

## 2013-11-25 DIAGNOSIS — E118 Type 2 diabetes mellitus with unspecified complications: Principal | ICD-10-CM

## 2013-11-25 DIAGNOSIS — I10 Essential (primary) hypertension: Secondary | ICD-10-CM

## 2013-11-25 DIAGNOSIS — E1165 Type 2 diabetes mellitus with hyperglycemia: Secondary | ICD-10-CM

## 2013-11-25 DIAGNOSIS — E039 Hypothyroidism, unspecified: Secondary | ICD-10-CM
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
## 2014-01-16 ENCOUNTER — Other Ambulatory Visit: Payer: Self-pay | Admitting: *Deleted

## 2014-01-16 MED ORDER — HYDROCODONE-ACETAMINOPHEN 5-325 MG PO TABS: ORAL_TABLET | ORAL | Status: DC

## 2014-01-16 NOTE — Telephone Encounter (Signed)
Rx faxed to [facility] @ [phone] 

## 2014-01-27 ENCOUNTER — Non-Acute Institutional Stay (SKILLED_NURSING_FACILITY): Payer: PRIVATE HEALTH INSURANCE | Admitting: Internal Medicine

## 2014-01-27 DIAGNOSIS — L0291 Cutaneous abscess, unspecified: Secondary | ICD-10-CM

## 2014-01-27 DIAGNOSIS — L0390 Cellulitis, unspecified: Secondary | ICD-10-CM

## 2014-01-27 DIAGNOSIS — L02419 Cutaneous abscess of limb, unspecified: Secondary | ICD-10-CM

## 2014-01-27 DIAGNOSIS — L03119 Cellulitis of unspecified part of limb: Secondary | ICD-10-CM

## 2014-01-27 NOTE — Progress Notes (Signed)
Patient ID: Alex Foster, male   DOB: 06/13/1946, 68 y.o.   MRN: [id number] Facility; [name] Chief complaint; review of area on his left posterior thigh History; that this is a patient through apparently had a small abscess on his past years by that I briefly saw 3 weeks ago. I ordered Septra and basically this almost resolved however it is apparently had become red and inflamed draining and reopened. Facility nurses are pointed out that this may be secondary to rubbing on the edge of his wheelchair cushion or the edge of his wheelchair. This is certainly possible.  Physical examination Posterior right thigh. This area has obviously opened. There is considerable amount of erythema around the actual open area and it is very tender. I have done a culture of this area. It probes widely around the small opening. There is nothing to suggest a deeper fluid collection. I don't think surgical opening of this is going to do anything additional to antibiotic and wound care.  Impression/plan #1 abscess in the posterior left thigh. I've got a culture of this area. We'll dress it with silver alginate strips and I'm going to start him on doxycycline empirically while we await further culture results. Facility is asking about a wheelchair cushion I think this makes good sense in any case.
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
## 2014-01-31 IMAGING — CR DG CHEST 1V PORT
1 series · 1 of 1 positions shown · non-contrast
Comparison: Yesterday.

CLINICAL DATA: a follow-up chest tubes and subcutaneous emphysema.

PORTABLE CHEST - 1 VIEW

[AP]
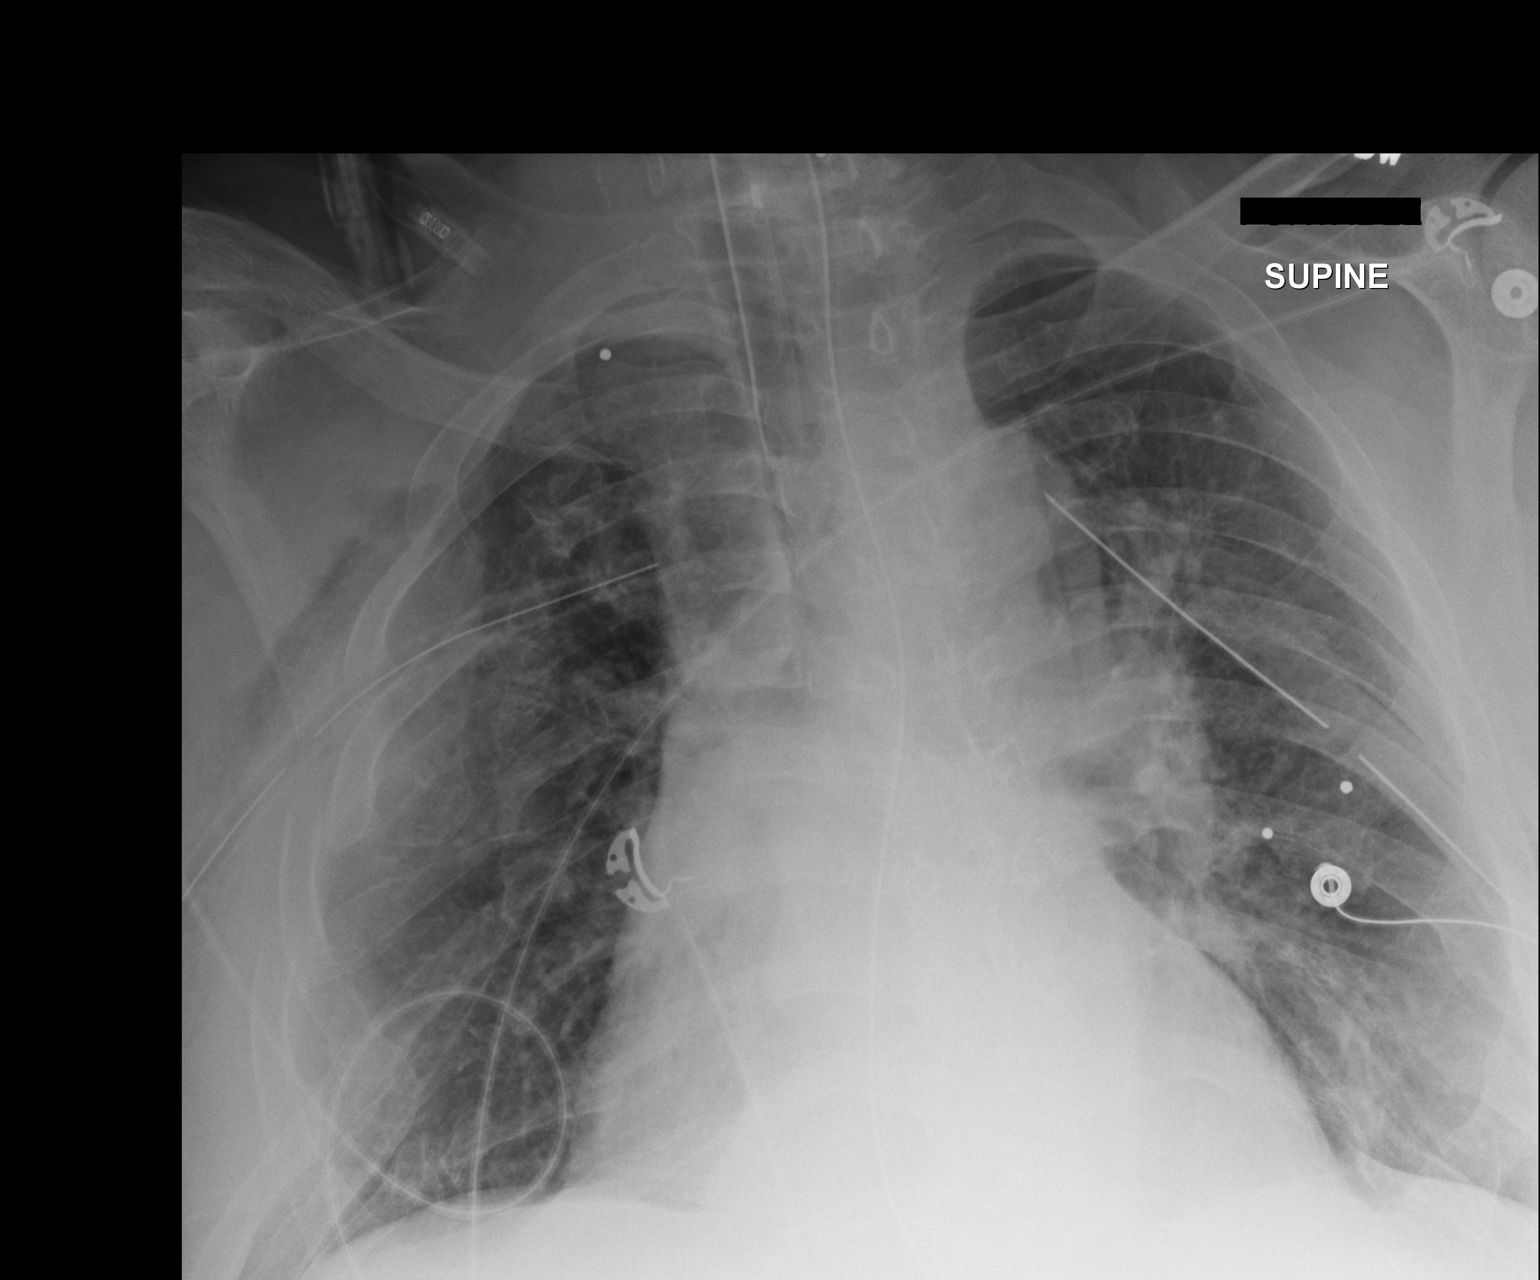

[1 of 1 positions shown; findings below may reference images not displayed]

FINDINGS: Bilateral chest tubes remain in place.  The side hole of
the chest tube on the right is in the soft tissues lateral to the
ribs.  There is slightly less adjacent subcutaneous emphysema on
the right.  The previously seen subcutaneous emphysema on the left
is not included on today's image.

Old, healed left rib fractures are again demonstrated as well as
probable old right rib fractures and possible thoracotomy defects
with nonunion.  Endotracheal tube in satisfactory position.
Nasogastric tube extending into the stomach.

A probable hiatal hernia is noted.  The cardiac silhouette is
enlarged.  The interstitial markings remain prominent.  No
pneumothorax on either side.
IMPRESSION: 1.  The right chest tube side hole is in the soft tissues with
slightly improved adjacent subcutaneous emphysema.
2.  Stable cardiomegaly and mild chronic interstitial lung disease.
3.  Probable hiatal hernia.

## 2014-03-03 ENCOUNTER — Non-Acute Institutional Stay (SKILLED_NURSING_FACILITY): Payer: PRIVATE HEALTH INSURANCE | Admitting: Internal Medicine

## 2014-03-03 DIAGNOSIS — J449 Chronic obstructive pulmonary disease, unspecified: Secondary | ICD-10-CM

## 2014-03-03 DIAGNOSIS — E1165 Type 2 diabetes mellitus with hyperglycemia: Principal | ICD-10-CM

## 2014-03-03 DIAGNOSIS — E1149 Type 2 diabetes mellitus with other diabetic neurological complication: Secondary | ICD-10-CM

## 2014-03-03 DIAGNOSIS — E1129 Type 2 diabetes mellitus with other diabetic kidney complication: Secondary | ICD-10-CM
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
## 2014-04-08 ENCOUNTER — Other Ambulatory Visit: Payer: Self-pay | Admitting: *Deleted

## 2014-04-08 MED ORDER — HYDROCODONE-ACETAMINOPHEN 5-325 MG PO TABS: ORAL_TABLET | ORAL | Status: DC

## 2014-04-08 NOTE — Telephone Encounter (Signed)
[facility] 

## 2014-04-26 ENCOUNTER — Non-Acute Institutional Stay (SKILLED_NURSING_FACILITY): Payer: PRIVATE HEALTH INSURANCE | Admitting: Internal Medicine

## 2014-04-26 DIAGNOSIS — E1165 Type 2 diabetes mellitus with hyperglycemia: Principal | ICD-10-CM

## 2014-04-26 DIAGNOSIS — E1129 Type 2 diabetes mellitus with other diabetic kidney complication: Secondary | ICD-10-CM

## 2014-04-26 DIAGNOSIS — J449 Chronic obstructive pulmonary disease, unspecified: Secondary | ICD-10-CM
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
## 2014-05-06 ENCOUNTER — Other Ambulatory Visit: Payer: Self-pay | Admitting: *Deleted

## 2014-05-06 MED ORDER — HYDROCODONE-ACETAMINOPHEN 5-325 MG PO TABS: ORAL_TABLET | ORAL | Status: DC

## 2014-05-06 NOTE — Telephone Encounter (Signed)
[facility] 

## 2014-05-18 ENCOUNTER — Other Ambulatory Visit: Payer: Self-pay | Admitting: Internal Medicine

## 2014-05-18 ENCOUNTER — Ambulatory Visit (HOSPITAL_COMMUNITY)
Admission: RE | Admit: 2014-05-18 | Discharge: 2014-05-18 | Disposition: A | Discharge status: Home / Self Care | Payer: PRIVATE HEALTH INSURANCE | Source: Ambulatory Visit | Attending: Internal Medicine | Admitting: Internal Medicine

## 2014-05-18 DIAGNOSIS — R4182 Altered mental status, unspecified: Secondary | ICD-10-CM | POA: Insufficient documentation

## 2014-05-18 NOTE — Procedures (Signed)
Called report to nursing home and spoke with Amy Black,PA

## 2014-05-26 ENCOUNTER — Non-Acute Institutional Stay (SKILLED_NURSING_FACILITY): Payer: PRIVATE HEALTH INSURANCE | Admitting: Internal Medicine

## 2014-05-26 DIAGNOSIS — R29818 Other symptoms and signs involving the nervous system: Secondary | ICD-10-CM

## 2014-05-26 DIAGNOSIS — R269 Unspecified abnormalities of gait and mobility: Secondary | ICD-10-CM

## 2014-05-26 DIAGNOSIS — R296 Repeated falls: Secondary | ICD-10-CM

## 2014-05-26 DIAGNOSIS — E559 Vitamin D deficiency, unspecified: Secondary | ICD-10-CM
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
## 2014-06-15 ENCOUNTER — Non-Acute Institutional Stay (SKILLED_NURSING_FACILITY): Payer: PRIVATE HEALTH INSURANCE | Admitting: Internal Medicine

## 2014-06-15 DIAGNOSIS — L02212 Cutaneous abscess of back [any part, except buttock]: Secondary | ICD-10-CM

## 2014-06-15 DIAGNOSIS — L03319 Cellulitis of trunk, unspecified: Secondary | ICD-10-CM

## 2014-06-15 DIAGNOSIS — L02219 Cutaneous abscess of trunk, unspecified: Secondary | ICD-10-CM

## 2014-06-22 NOTE — Progress Notes (Addendum)
Patient ID: Alex Foster, male   DOB: [birth year], 68 y.o.   MRN: [id number]               PROGRESS NOTE  DATE:  06/15/2014    FACILITY: [name]    LEVEL OF CARE:   SNF   Acute Visit   CHIEF COMPLAINT:  Abscess in the mid thoracic midline part of his back.    HISTORY OF PRESENT ILLNESS:  This is a patient who has had previous Staph abscesses, at least one earlier this year on his thigh.  This cultured methicillin-sensitive Staph aureus.  Over the last several days, he has developed a painful area in the lower mid part of his thoracic spine.  I was asked to look at this today.    PHYSICAL EXAMINATION:   SKIN:  INSPECTION:  There is an area measuring roughly 2 in x 1 in.  There is already purulent drainage out of this.   The area was cleansed, anesthetized with injectable lidocaine, and lanced using a #15 scalpel.  The patient tolerated this very well.  A culture was done.    ASSESSMENT/PLAN:  Cellulitis/abscess on the back.  I am going to start him on doxycycline as he is noted to be allergic to penicillins.

## 2014-06-29 ENCOUNTER — Non-Acute Institutional Stay (SKILLED_NURSING_FACILITY): Payer: PRIVATE HEALTH INSURANCE | Admitting: Internal Medicine

## 2014-06-29 DIAGNOSIS — E559 Vitamin D deficiency, unspecified: Secondary | ICD-10-CM

## 2014-06-29 DIAGNOSIS — R29818 Other symptoms and signs involving the nervous system: Secondary | ICD-10-CM

## 2014-06-29 DIAGNOSIS — R296 Repeated falls: Secondary | ICD-10-CM
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
## 2014-07-02 ENCOUNTER — Institutional Professional Consult (permissible substitution): Payer: Self-pay | Admitting: Cardiology

## 2014-07-06 ENCOUNTER — Ambulatory Visit (INDEPENDENT_AMBULATORY_CARE_PROVIDER_SITE_OTHER): Payer: PRIVATE HEALTH INSURANCE | Admitting: Neurology

## 2014-07-06 ENCOUNTER — Encounter: Payer: Self-pay | Admitting: Neurology

## 2014-07-06 VITALS — BP 122/70 | HR 68 | Temp 97.5°F | Resp 18 | Ht 72.0 in | Wt 258.9 lb

## 2014-07-06 DIAGNOSIS — I679 Cerebrovascular disease, unspecified: Secondary | ICD-10-CM

## 2014-07-06 DIAGNOSIS — F0391 Unspecified dementia with behavioral disturbance: Secondary | ICD-10-CM

## 2014-07-06 DIAGNOSIS — R296 Repeated falls: Secondary | ICD-10-CM

## 2014-07-06 DIAGNOSIS — S069X1A Unspecified intracranial injury with loss of consciousness of 30 minutes or less, initial encounter: Secondary | ICD-10-CM

## 2014-07-06 DIAGNOSIS — F03918 Unspecified dementia, unspecified severity, with other behavioral disturbance: Secondary | ICD-10-CM

## 2014-07-06 DIAGNOSIS — R4182 Altered mental status, unspecified: Secondary | ICD-10-CM

## 2014-07-06 NOTE — Patient Instructions (Signed)
Falls likely related to the knee and maybe complicated by prior pneumonia Altered behavior likely related to prior pneumonia  1.  I would have him on at least aspirin 81mg  daily for secondary stroke prevention 2.  Statin therapy with LDL goal less than 100 3.  Ambulate with assistance 4.  Follow up in 6 months.
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
## 2014-07-28 ENCOUNTER — Encounter: Payer: Self-pay | Admitting: Vascular Surgery

## 2014-08-03 ENCOUNTER — Encounter: Payer: Self-pay | Admitting: Vascular Surgery

## 2014-08-04 ENCOUNTER — Ambulatory Visit (INDEPENDENT_AMBULATORY_CARE_PROVIDER_SITE_OTHER): Payer: PRIVATE HEALTH INSURANCE | Admitting: Vascular Surgery

## 2014-08-04 ENCOUNTER — Encounter: Payer: Self-pay | Admitting: Vascular Surgery

## 2014-08-04 VITALS — BP 139/72 | HR 67 | Ht 72.0 in | Wt 258.0 lb

## 2014-08-04 DIAGNOSIS — I714 Abdominal aortic aneurysm, without rupture, unspecified: Secondary | ICD-10-CM | POA: Insufficient documentation

## 2014-08-04 NOTE — Addendum Note (Signed)
Addended by: Mena Goes on: 08/04/2014 04:27 PM   Modules accepted: Orders
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
## 2014-08-18 ENCOUNTER — Non-Acute Institutional Stay (SKILLED_NURSING_FACILITY): Payer: PRIVATE HEALTH INSURANCE | Admitting: Internal Medicine

## 2014-08-18 DIAGNOSIS — I798 Other disorders of arteries, arterioles and capillaries in diseases classified elsewhere: Secondary | ICD-10-CM

## 2014-08-18 DIAGNOSIS — I714 Abdominal aortic aneurysm, without rupture, unspecified: Secondary | ICD-10-CM

## 2014-08-18 DIAGNOSIS — R269 Unspecified abnormalities of gait and mobility: Secondary | ICD-10-CM

## 2014-08-18 DIAGNOSIS — I723 Aneurysm of iliac artery: Secondary | ICD-10-CM

## 2014-08-18 DIAGNOSIS — E1151 Type 2 diabetes mellitus with diabetic peripheral angiopathy without gangrene: Secondary | ICD-10-CM
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
## 2014-09-07 ENCOUNTER — Other Ambulatory Visit: Payer: Self-pay | Admitting: *Deleted

## 2014-09-07 ENCOUNTER — Encounter: Payer: Self-pay | Admitting: Vascular Surgery

## 2014-09-07 DIAGNOSIS — Z01812 Encounter for preprocedural laboratory examination: Secondary | ICD-10-CM

## 2014-09-08 ENCOUNTER — Inpatient Hospital Stay: Admission: RE | Admit: 2014-09-08 | Payer: Self-pay | Source: Ambulatory Visit

## 2014-09-08 ENCOUNTER — Ambulatory Visit: Payer: Self-pay | Admitting: Vascular Surgery

## 2014-10-06 ENCOUNTER — Non-Acute Institutional Stay (SKILLED_NURSING_FACILITY): Payer: Medicare Other | Admitting: Internal Medicine

## 2014-10-06 DIAGNOSIS — J441 Chronic obstructive pulmonary disease with (acute) exacerbation: Secondary | ICD-10-CM

## 2014-10-06 DIAGNOSIS — J9611 Chronic respiratory failure with hypoxia: Secondary | ICD-10-CM
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
## 2014-10-12 ENCOUNTER — Encounter: Payer: Self-pay | Admitting: Vascular Surgery

## 2014-10-13 ENCOUNTER — Ambulatory Visit: Payer: Medicaid Other | Admitting: Vascular Surgery

## 2014-10-13 ENCOUNTER — Inpatient Hospital Stay
Admission: RE | Admit: 2014-10-13 | Discharge: 2014-10-13 | Disposition: A | Discharge status: Home / Self Care | Payer: Self-pay | Source: Ambulatory Visit | Attending: Vascular Surgery | Admitting: Vascular Surgery

## 2014-10-15 ENCOUNTER — Encounter (HOSPITAL_COMMUNITY): Payer: Self-pay | Admitting: General Surgery

## 2014-10-26 ENCOUNTER — Encounter: Payer: Self-pay | Admitting: Vascular Surgery

## 2014-10-27 ENCOUNTER — Ambulatory Visit
Admission: RE | Admit: 2014-10-27 | Discharge: 2014-10-27 | Disposition: A | Discharge status: Home / Self Care | Payer: Medicare Other | Source: Ambulatory Visit | Attending: Vascular Surgery | Admitting: Vascular Surgery

## 2014-10-27 ENCOUNTER — Telehealth: Payer: Self-pay | Admitting: Vascular Surgery

## 2014-10-27 ENCOUNTER — Ambulatory Visit (INDEPENDENT_AMBULATORY_CARE_PROVIDER_SITE_OTHER): Payer: Medicare Other | Admitting: Vascular Surgery

## 2014-10-27 ENCOUNTER — Encounter: Payer: Self-pay | Admitting: Vascular Surgery

## 2014-10-27 VITALS — BP 156/73 | HR 70 | Resp 16 | Ht 72.0 in | Wt 260.0 lb

## 2014-10-27 DIAGNOSIS — R160 Hepatomegaly, not elsewhere classified: Secondary | ICD-10-CM

## 2014-10-27 DIAGNOSIS — I714 Abdominal aortic aneurysm, without rupture, unspecified: Secondary | ICD-10-CM

## 2014-10-27 MED ORDER — IOHEXOL 350 MG/ML SOLN
60.0000 mL | Freq: Once | INTRAVENOUS | Status: AC | PRN
  Administered 2014-10-27: 60 mL via INTRAVENOUS

## 2014-10-27 NOTE — Telephone Encounter (Signed)
Spoke with Judson Roch at [facility] and gave her the following appointment information: 11/11/14  9:40 am [place] [facility] - No food/liquid 4 hrs prior. 11/02/15 9 am lab and Dr. Donnetta Hutching - fasting info was given. Sarah verbalized understanding.

## 2014-10-27 NOTE — Progress Notes (Signed)
The patient resents today for follow-up of his abdominal and iliac artery aneurysm. We've seen him in November at which time he had no recent imaging studies. We recommend CT scan of his abdomen and pelvis for further follow-up. He does have multiple multiple medical problems and is in a wheelchair in a nursing facility. Alert and oriented. He has no symptoms referable to his aneurysm.  Past Medical History  Diagnosis Date  . Hyperlipidemia   . COPD (chronic obstructive pulmonary disease)     Active smoker, has oxygen for nightime use.  Cannot tolerate using mask.  . Low back pain     Goes to [facility]  . H/O alcohol abuse   . Myocardial infarction   . Hypertension   . Diabetes mellitus   . 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 Extensive facial fractures 05/31/2012  . MRSA (methicillin resistant staphylococcus aureus) pneumonia 06/02/2012  . Cardiac arrest 06/02/2012  . Mobitz (type) II atrioventricular block 06/04/2012  . DVT of axillary vein, acute right 08/03/2012    Dx 06/17/2012 by Duplex   . COPD 05/08/2007    Pt continues to smoke.  He does not desire smoking cessation.  Currently he smokes about 1 1/2 ppd.     . Acute respiratory failure with hypoxia 06/02/2012  . Tracheobronchitis, MRSA 06/02/2012  . Pleural effusion 06/09/2012  . Type II or unspecified type diabetes mellitus with unspecified complication, uncontrolled 07/21/2013  . Hepatic encephalopathy 04/12/2011  . Leukocytosis 06/01/2012  . Lactic acidosis 06/01/2012  . Dizziness 07/18/2011  . Fall 11/04/2012  . Tracheostomy status 11/11/2012  . Thrombocytopenia 03/24/2011    Transient   . Multiple rib fractures 05/31/2012  . Multiple fractures of ribs of left side 11/04/2012  . OTHER DISORDERS OF BONE AND CARTILAGE OTHER 08/17/2010    Narcotics prescribed by outside clinic.     Marland Kitchen BENIGN POSITIONAL VERTIGO 08/10/2008    Pt has been complaining of dizziness or sensations that he will fall or run into a wall.  His complaints date  back to 2010-2011.  His orthostatics were negative.  He refused MRI as part of work up for TIA concerns.  I have referred him to Vestibular clinic.      Marland Kitchen Dementia with behavioral disturbance 04/18/2013  . Dysphagia, unspecified(787.20) 07/21/2013  . RBBB 02/03/2010    Qualifier: Diagnosis of  By: Aundra Dubin, MD, [place]    . HYPERTENSION, BENIGN ESSENTIAL, CONTROLLED 05/08/2007    Qualifier: Diagnosis of  By: Laverle Hobby MD, JOSEPH      History  Substance Use Topics  . Smoking status: Former Smoker -- 0.25 packs/day for 84 years    Quit date: 04/01/2013  . Smokeless tobacco: Never Used  . Alcohol Use: No    Family History  Problem Relation Age of Onset  . Heart disease Father   . Heart disease Sister     Allergies  Allergen Reactions  . Penicillins Other (See Comments)    Per MAR     Current outpatient prescriptions:  .  acetaminophen (TYLENOL) 325 MG tablet, Take 650 mg by mouth every 4 (four) hours as needed for pain., Disp: , Rfl:  .  amantadine (SYMMETREL) 100 MG capsule, Take 100 mg by mouth every morning., Disp: , Rfl:  .  amLODipine (NORVASC) 5 MG tablet, Take 5 mg by mouth every morning., Disp: , Rfl:  .  aspirin 81 MG tablet, Take 81 mg by mouth daily., Disp: , Rfl:  .  atorvastatin (LIPITOR) 10 MG  tablet, Take 10 mg by mouth daily., Disp: , Rfl:  .  busPIRone (BUSPAR) 5 MG tablet, Take 5 mg by mouth 2 (two) times daily., Disp: , Rfl:  .  citalopram (CELEXA) 20 MG tablet, Take 20 mg by mouth every morning., Disp: , Rfl:  .  cyclobenzaprine (FLEXERIL) 5 MG tablet, Take 5 mg by mouth 2 (two) times daily as needed. , Disp: , Rfl:  .  divalproex (DEPAKOTE) 500 MG DR tablet, Take 500 mg by mouth 2 (two) times daily., Disp: , Rfl:  .  furosemide (LASIX) 20 MG tablet, Take 20 mg by mouth daily., Disp: , Rfl:  .  glimepiride (AMARYL) 1 MG tablet, Take 1 mg by mouth 2 (two) times daily., Disp: , Rfl:  .  HYDROcodone-acetaminophen (NORCO/VICODIN) 5-325 MG per tablet, Take one tablet by mouth  every morning at 10am; Take one tablet by mouth every 4 hours as needed for pain, Disp: 210 tablet, Rfl: 0 .  insulin aspart (NOVOLOG) 100 UNIT/ML injection, Inject 5 Units into the skin 3 (three) times daily as needed for high blood sugar (if CBG >150)., Disp: , Rfl:  .  levothyroxine (SYNTHROID, LEVOTHROID) 50 MCG tablet, Take 50 mcg by mouth daily before breakfast., Disp: , Rfl:  .  lisinopril (PRINIVIL,ZESTRIL) 10 MG tablet, Take 1 tablet (10 mg total) by mouth daily., Disp: 90 tablet, Rfl: 3 .  memantine (NAMENDA) 5 MG tablet, Take 5 mg by mouth 2 (two) times daily., Disp: , Rfl:  .  pantoprazole (PROTONIX) 40 MG tablet, Take 40 mg by mouth every morning., Disp: , Rfl:   BP 156/73 mmHg  Pulse 70  Resp 16  Ht 6' (1.829 m)  Wt 260 lb (117.935 kg)  BMI 35.25 kg/m2  Body mass index is 35.25 kg/(m^2).       Exam is unchanged. Abdomen soft and nontender. He does have obesity and a large ventral incisional hernia. He is grossly intact neurologically Respirations are equal in nonlabored  CT scan today was reviewed with the patient. This does show enlargement of his infrarenal aorta up to 5.2 cm also enlargement of his iliac arteries bilaterally with one being just over 4 cm. Also of concern the patient has enhancement of his liver which radiologist's interpretation is concerned for primary liver cancer or metastasis. MRI for further evaluation has been recommended.  I discussed this in great detail with patient. I explained that although his aneurysm has enlarged that he is not a stent graft candidate due to both of his right renal arteries arising from his aneurysm. I feel that his risk with open aneurysm repair 4 hour disease his risk for rupture. For this reason I have recommended continued observation only and recommended that we see him in one year for ultrasound to rule out continued enlargement. We will order a MRI of this liver mass and direct him appropriately should this be  abnormal.

## 2014-10-27 NOTE — Addendum Note (Signed)
Addended by: Mena Goes on: 10/27/2014 01:29 PM   Modules accepted: Orders

## 2014-11-03 ENCOUNTER — Non-Acute Institutional Stay (SKILLED_NURSING_FACILITY): Payer: Medicare Other | Admitting: Internal Medicine

## 2014-11-03 DIAGNOSIS — I714 Abdominal aortic aneurysm, without rupture, unspecified: Secondary | ICD-10-CM

## 2014-11-03 DIAGNOSIS — I798 Other disorders of arteries, arterioles and capillaries in diseases classified elsewhere: Secondary | ICD-10-CM

## 2014-11-03 DIAGNOSIS — E1151 Type 2 diabetes mellitus with diabetic peripheral angiopathy without gangrene: Secondary | ICD-10-CM
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
## 2014-11-11 ENCOUNTER — Other Ambulatory Visit: Payer: Self-pay

## 2014-11-13 ENCOUNTER — Ambulatory Visit
Admission: RE | Admit: 2014-11-13 | Discharge: 2014-11-13 | Disposition: A | Discharge status: Home / Self Care | Payer: Medicare Other | Source: Ambulatory Visit | Attending: Vascular Surgery | Admitting: Vascular Surgery

## 2014-11-13 DIAGNOSIS — R160 Hepatomegaly, not elsewhere classified: Secondary | ICD-10-CM

## 2014-11-13 MED ORDER — GADOXETATE DISODIUM 0.25 MOL/L IV SOLN
10.0000 mL | Freq: Once | INTRAVENOUS | Status: AC | PRN
Start: 2014-11-13 — End: 2014-11-13
  Administered 2014-11-13: 10 mL via INTRAVENOUS

## 2014-11-23 ENCOUNTER — Other Ambulatory Visit: Payer: Self-pay | Admitting: *Deleted

## 2014-11-23 MED ORDER — HYDROCODONE-ACETAMINOPHEN 5-325 MG PO TABS: ORAL_TABLET | ORAL | Status: DC

## 2014-11-23 NOTE — Telephone Encounter (Signed)
[facility] 

## 2014-12-22 ENCOUNTER — Non-Acute Institutional Stay (SKILLED_NURSING_FACILITY): Payer: Medicare Other | Admitting: Internal Medicine

## 2014-12-22 DIAGNOSIS — E1151 Type 2 diabetes mellitus with diabetic peripheral angiopathy without gangrene: Secondary | ICD-10-CM

## 2014-12-22 DIAGNOSIS — K7469 Other cirrhosis of liver: Secondary | ICD-10-CM

## 2014-12-22 DIAGNOSIS — I714 Abdominal aortic aneurysm, without rupture, unspecified: Secondary | ICD-10-CM

## 2014-12-22 DIAGNOSIS — C220 Liver cell carcinoma: Secondary | ICD-10-CM | POA: Diagnosis not present

## 2014-12-22 DIAGNOSIS — I798 Other disorders of arteries, arterioles and capillaries in diseases classified elsewhere: Secondary | ICD-10-CM

## 2015-01-01 ENCOUNTER — Ambulatory Visit: Payer: Self-pay | Admitting: Neurology
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
## 2015-01-04 ENCOUNTER — Encounter: Payer: Self-pay | Admitting: Neurology

## 2015-01-04 ENCOUNTER — Ambulatory Visit: Payer: Self-pay | Admitting: Neurology

## 2015-01-05 ENCOUNTER — Ambulatory Visit: Payer: Self-pay | Admitting: Neurology

## 2015-01-05 ENCOUNTER — Non-Acute Institutional Stay (SKILLED_NURSING_FACILITY): Payer: Medicare Other | Admitting: Internal Medicine

## 2015-01-05 DIAGNOSIS — K7469 Other cirrhosis of liver: Secondary | ICD-10-CM

## 2015-01-05 DIAGNOSIS — C220 Liver cell carcinoma: Secondary | ICD-10-CM | POA: Diagnosis not present

## 2015-01-10 NOTE — Progress Notes (Addendum)
Patient ID: Alex Foster, male   DOB: [birth year], 69 y.o.   MRN: [id number]                PROGRESS NOTE  DATE:  01/05/2015          FACILITY: [name]                 LEVEL OF CARE:   SNF   Acute Visit                          CHIEF COMPLAINT:  Follow up hepatocellular mass.        HISTORY OF PRESENT ILLNESS:  I am referencing my note of 12/22/2014.  This is a patient who had follow-up with Dr. Donnetta Hutching of Vascular Surgery.  A CT scan done of his abdomen in order to look at an abdominal aortic aneurysm and an iliac artery aneurysm incidentally showed masses in his liver.  A follow-up MRI was done on 11/13/2014 that showed cirrhotic changes in the liver and mild splenomegaly.  There was a large complex mass in the liver.  This was very concerning for hepatocellular carcinoma with some small satellite lesions.  There were no obvious retroperitoneal nodes.   No obvious vascular invasion.  The scan was felt to be compatible with hepatocellular carcinoma.  There was no portal vein thrombosis or tumor invasion and no adenopathy.    The patient does not carry cirrhosis on his diagnosis list that I am aware of.  He states he used to drink alcohol heavily, but has no known history of hepatitis, specifically B or C.  He has a reasonably long list of complex medical issues, including the abdominal aortic and iliac artery aneurysms which are not felt to be amenable to stenting and he is not felt to be a candidate for open repair.     He has COPD, type 2 diabetes.        REVIEW OF SYSTEMS:    CHEST/RESPIRATORY:  He is not complaining of shortness of breath.   CARDIAC:  No clear chest pain.   GI:  He does not complain of abdominal pain, nausea, vomiting, or diarrhea.    PHYSICAL EXAMINATION:   GENERAL APPEARANCE:  The patient looks in no distress.   CHEST/RESPIRATORY:  Reasonably clear air entry bilaterally.    CARDIOVASCULAR:   CARDIAC:  Heart sounds are normal.  He appears to be euvolemic.       GASTROINTESTINAL:   ABDOMEN:  Distended.   HERNIA:  He has a medium-sized umbilical hernia which reduces fairly easily.   LIVER/SPLEEN/KIDNEYS:  I do not think he has ascites.  His liver and spleen edges are not palpable.   He has no stigmata of chronic liver disease that I can see.      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  I will do hepatitis B and C screening on him, order an alpha-fetoprotein level.  I did discuss this with the patient.  I do not think he is a great candidate for aggressive therapy options.  However, he did express a wish to go forward with Medical Oncology, which I will arrange.     In terms of family support, the patient has brothers who  live in [place] whom I believe are his spokespersons, although the patient is his own primary RP.   I think he is capable of this, although it would be nice to have some family input here.

## 2015-01-13 ENCOUNTER — Telehealth: Payer: Self-pay | Admitting: Hematology

## 2015-01-13 NOTE — Telephone Encounter (Signed)
NEW PATIENT APPT- S/W CLARISSA @ [facility] AND GAVE NP APPT FOR 04/26 @ 2:30 W/DR. FENG

## 2015-01-25 ENCOUNTER — Encounter: Payer: Self-pay | Admitting: *Deleted

## 2015-01-25 DIAGNOSIS — C220 Liver cell carcinoma: Secondary | ICD-10-CM | POA: Insufficient documentation

## 2015-01-26 ENCOUNTER — Encounter: Payer: Self-pay | Admitting: Hematology

## 2015-01-26 ENCOUNTER — Other Ambulatory Visit (HOSPITAL_BASED_OUTPATIENT_CLINIC_OR_DEPARTMENT_OTHER): Payer: Medicare Other

## 2015-01-26 ENCOUNTER — Ambulatory Visit (HOSPITAL_BASED_OUTPATIENT_CLINIC_OR_DEPARTMENT_OTHER): Payer: Medicare Other | Admitting: Hematology

## 2015-01-26 ENCOUNTER — Ambulatory Visit: Payer: Medicare Other

## 2015-01-26 VITALS — BP 162/63 | HR 76 | Temp 97.8°F | Resp 18 | Ht 72.0 in | Wt 276.7 lb

## 2015-01-26 DIAGNOSIS — D490 Neoplasm of unspecified behavior of digestive system: Secondary | ICD-10-CM | POA: Diagnosis not present

## 2015-01-26 DIAGNOSIS — Z8673 Personal history of transient ischemic attack (TIA), and cerebral infarction without residual deficits: Secondary | ICD-10-CM

## 2015-01-26 DIAGNOSIS — K769 Liver disease, unspecified: Secondary | ICD-10-CM | POA: Diagnosis not present

## 2015-01-26 DIAGNOSIS — K7460 Unspecified cirrhosis of liver: Secondary | ICD-10-CM

## 2015-01-26 DIAGNOSIS — F1021 Alcohol dependence, in remission: Secondary | ICD-10-CM

## 2015-01-26 DIAGNOSIS — J449 Chronic obstructive pulmonary disease, unspecified: Secondary | ICD-10-CM

## 2015-01-26 DIAGNOSIS — C220 Liver cell carcinoma: Secondary | ICD-10-CM

## 2015-01-26 LAB — CBC & DIFF AND RETIC
BASO%: 0.2 % (ref 0.0–2.0)
Basophils Absolute: 0 10*3/uL (ref 0.0–0.1)
EOS%: 2.3 % (ref 0.0–7.0)
Eosinophils Absolute: 0.1 10*3/uL (ref 0.0–0.5)
HCT: 34.7 % — ABNORMAL LOW (ref 38.4–49.9)
HGB: 11.4 g/dL — ABNORMAL LOW (ref 13.0–17.1)
Immature Retic Fract: 9.5 % (ref 3.00–10.60)
LYMPH#: 1.3 10*3/uL (ref 0.9–3.3)
LYMPH%: 24.6 % (ref 14.0–49.0)
MCH: 28.6 pg (ref 27.2–33.4)
MCHC: 32.9 g/dL (ref 32.0–36.0)
MCV: 87.2 fL (ref 79.3–98.0)
MONO#: 0.7 10*3/uL (ref 0.1–0.9)
MONO%: 13.2 % (ref 0.0–14.0)
NEUT%: 59.7 % (ref 39.0–75.0)
NEUTROS ABS: 3.1 10*3/uL (ref 1.5–6.5)
Platelets: 131 10*3/uL — ABNORMAL LOW (ref 140–400)
RBC: 3.98 10*6/uL — AB (ref 4.20–5.82)
RDW: 14.4 % (ref 11.0–14.6)
Retic %: 1.87 % — ABNORMAL HIGH (ref 0.80–1.80)
Retic Ct Abs: 74.43 10*3/uL (ref 34.80–93.90)
WBC: 5.2 10*3/uL (ref 4.0–10.3)

## 2015-01-26 LAB — COMPREHENSIVE METABOLIC PANEL (CC13)
ALBUMIN: 3.3 g/dL — AB (ref 3.5–5.0)
ALT: 11 U/L (ref 0–55)
AST: 14 U/L (ref 5–34)
Alkaline Phosphatase: 122 U/L (ref 40–150)
Anion Gap: 12 mEq/L — ABNORMAL HIGH (ref 3–11)
BUN: 35.5 mg/dL — AB (ref 7.0–26.0)
CHLORIDE: 96 meq/L — AB (ref 98–109)
CO2: 26 mEq/L (ref 22–29)
Calcium: 8.6 mg/dL (ref 8.4–10.4)
Creatinine: 1.7 mg/dL — ABNORMAL HIGH (ref 0.7–1.3)
EGFR: 40 mL/min/{1.73_m2} — AB (ref >90)
GLUCOSE: 498 mg/dL — AB (ref 70–140)
Potassium: 6.1 mEq/L (ref 3.5–5.1)
Sodium: 134 mEq/L — ABNORMAL LOW (ref 136–145)
TOTAL PROTEIN: 6.6 g/dL (ref 6.4–8.3)
Total Bilirubin: 0.28 mg/dL (ref 0.20–1.20)

## 2015-01-26 LAB — PROTIME-INR
INR: 1 — ABNORMAL LOW (ref 2.00–3.50)
Protime: 12 Seconds (ref 10.6–13.4)

## 2015-01-26 LAB — LACTATE DEHYDROGENASE (CC13): LDH: 145 U/L (ref 125–245)

## 2015-01-26 MED ORDER — MORPHINE SULFATE ER 30 MG PO TBCR: 30.0000 mg | EXTENDED_RELEASE_TABLET | Freq: Two times a day (BID) | ORAL | Status: DC
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
## 2015-01-26 NOTE — Progress Notes (Signed)
Checked in new pt with no financial concerns.  Pt has 2 insurance so financial assistance may not be needed.

## 2015-01-26 NOTE — [facility] Oncology Navigator Note (Signed)
Met with patient  during new patient visit. Explained the role of the GI Nurse Navigator. Reviewed current treatment plan using TEACH back and provided emotional support. Provided copy of current treatment plan to nursing home transporter. Will add patient to GI Conference on 02/03/15. Dr. Burr Medico will call his brother, Dorothyann Peng afterwards to discuss potential treatment since he is POA. Alex Foster expressed a desire to live as long as he can and at this point is agreeable to treatment to prolong his life. He was made aware his disease is not curable.  Merceda Elks, RN, BSN GI Oncology [place]

## 2015-01-27 LAB — AFP TUMOR MARKER: AFP TUMOR MARKER: 2.9 ng/mL (ref <6.1)

## 2015-01-27 LAB — CEA: CEA: 1.8 ng/mL (ref 0.0–5.0)

## 2015-01-27 LAB — CANCER ANTIGEN 19-9: CA 19-9: 17.5 U/mL (ref <35.0)

## 2015-01-27 NOTE — Addendum Note (Signed)
Addended by: Elray Buba LE on: 01/27/2015 05:41 PM   Modules accepted: Medications

## 2015-02-03 NOTE — Addendum Note (Signed)
Addended by: Truitt Merle on: 02/03/2015 10:18 PM   Modules accepted: Orders

## 2015-02-08 ENCOUNTER — Other Ambulatory Visit: Payer: Self-pay | Admitting: Radiology

## 2015-02-09 ENCOUNTER — Ambulatory Visit (HOSPITAL_COMMUNITY): Admission: RE | Admit: 2015-02-09 | Payer: Medicare Other | Source: Ambulatory Visit

## 2015-02-09 ENCOUNTER — Non-Acute Institutional Stay (SKILLED_NURSING_FACILITY): Payer: Medicare Other | Admitting: Internal Medicine

## 2015-02-09 DIAGNOSIS — E1151 Type 2 diabetes mellitus with diabetic peripheral angiopathy without gangrene: Secondary | ICD-10-CM

## 2015-02-09 DIAGNOSIS — I798 Other disorders of arteries, arterioles and capillaries in diseases classified elsewhere: Secondary | ICD-10-CM

## 2015-02-09 DIAGNOSIS — E1122 Type 2 diabetes mellitus with diabetic chronic kidney disease: Secondary | ICD-10-CM

## 2015-02-09 DIAGNOSIS — N183 Chronic kidney disease, stage 3 unspecified: Secondary | ICD-10-CM

## 2015-02-09 DIAGNOSIS — C220 Liver cell carcinoma: Secondary | ICD-10-CM | POA: Diagnosis not present

## 2015-02-15 ENCOUNTER — Other Ambulatory Visit: Payer: Self-pay | Admitting: Radiology

## 2015-02-15 ENCOUNTER — Other Ambulatory Visit: Payer: Self-pay | Admitting: Physician Assistant

## 2015-02-15 NOTE — Progress Notes (Addendum)
Patient ID: Alex Foster, male   DOB: Jan 19, 1946, 69 y.o.   MRN: [id number]                PROGRESS NOTE  DATE:  02/09/2015           FACILITY: [name]                          LEVEL OF CARE:   SNF   Routine Visit                CHIEF COMPLAINT:  Review of medical issues/[place] visit.        HISTORY OF PRESENT ILLNESS:  Mr. Trembath has had a complicated last month.  He was seen in consultation by his oncologist, Dr. Krista Blue.  He reviewed the MRI report that I had quoted in my previous notes.  Although he felt, I think, that this was hepatocellular carcinoma, he noted that his AFP was normal.   As well, there were features of the MRI contrast washout that he felt were atypical.  The other possibilities would be primary cholangiocarcinoma or metastatic cancer.   It was felt that he was not a good surgical candidate.  It was felt that chemoembolization or Y90 was the best option here.     Also noted to have very poor diabetes control.   His Lantus was increased to 20 U at night.  He is also on a sliding scale.  Amaryl was discontinued.  Looking at his blood sugars shows them to be mostly in the low 200 range at all times of the day.     CURRENT MEDICATIONS:  Medication list is reviewed.                       Synthroid 50 daily.     Feosol 325 daily.    Os-Cal 500 plus D, 1 tablet daily.      Vitamin D3, 5000 U daily.      Enteric-coated aspirin 81 q.d.       Exelon 9.5 daily.    Celexa 10 daily.       Lasix 20 q.d.       Protonix 40 q.d.       Zestril 30 q.d.      Depakote Extended Release 500 b.i.d.       Namenda 5 b.i.d.       Advair 250/50 b.i.d.        Lipitor 10 q.d.       Seroquel 50 q.h.s.       LABORATORY DATA:   Lab work done during his Oncology visit showed a BUN of 35.5, a creatinine of 1.7, a potassium of 6.1, total CO2 of 26.  Liver function tests were normal.  The creatinine of 1.7 was up from 1.0 on 11/29/2012 in the [facility] system and up from our  value on 01/22/2015 of 1.31, at which time his potassium was 4.9.  He was given Kayexalate 15 g once on 01/27/2015, I am assuming for the potassium of 6.1, although I do not see a follow-up here.    His CBC showed a white count of 5.2, hemoglobin of 11.4, platelets of 131.  Differential count was essentially normal.    His INR was 1.    Alpha-fetoprotein tumor marked was 2.9, i.e. not elevated (similar to the one we drew in the facility).   A CA 19-9 was 17.5.  A CEA level  was 1.8.  I believe these are within the normal range.    PHYSICAL EXAMINATION:   GENERAL APPEARANCE:  The patient looks in his usual state.   CHEST/RESPIRATORY:  Exam is clear.      CARDIOVASCULAR:   CARDIAC:  Heart sounds are normal.  He does not appear to be dehydrated.       GASTROINTESTINAL:   LIVER/SPLEEN/KIDNEYS:  No liver, no spleen.  No tenderness.   HERNIA:   He has an umbilical hernia, but this reduces fairly easily.      ASSESSMENT/PLAN:                            Chronic renal failure stage III.  Likely related to diabetes.  I note that his Zestril was recently increased to 22.   He may not be able to handle this.    I am going to follow up on his potassium, his BUN and creatinine.    Poorly controlled type 2 diabetes.  I note that the recent increase in his Lantus from 10 U to 20 U q.h.s. seems to have helped.    Hyperkalemia.   Likely secondary to ACE inhibitors.  This will need to be followed.      Liver mass.  Likely hepatocellular carcinoma.  I think this is being planned for a liver biopsy.  As I understand things, this will be on 02/16/2015.  We will need to hold his Lantus, I believe, as they are keeping him NPO for I believe the liver biopsy.  Consider putting him on an IV with some dextrose.

## 2015-02-16 ENCOUNTER — Ambulatory Visit (HOSPITAL_COMMUNITY)
Admission: RE | Admit: 2015-02-16 | Discharge: 2015-02-16 | Disposition: A | Discharge status: Home / Self Care | Payer: Medicare Other | Source: Ambulatory Visit | Attending: Hematology | Admitting: Hematology

## 2015-02-16 ENCOUNTER — Encounter (HOSPITAL_COMMUNITY): Payer: Self-pay

## 2015-02-16 DIAGNOSIS — E119 Type 2 diabetes mellitus without complications: Secondary | ICD-10-CM | POA: Diagnosis not present

## 2015-02-16 DIAGNOSIS — C220 Liver cell carcinoma: Secondary | ICD-10-CM | POA: Diagnosis not present

## 2015-02-16 DIAGNOSIS — Z9981 Dependence on supplemental oxygen: Secondary | ICD-10-CM | POA: Insufficient documentation

## 2015-02-16 DIAGNOSIS — J449 Chronic obstructive pulmonary disease, unspecified: Secondary | ICD-10-CM | POA: Insufficient documentation

## 2015-02-16 DIAGNOSIS — R160 Hepatomegaly, not elsewhere classified: Secondary | ICD-10-CM | POA: Diagnosis present

## 2015-02-16 DIAGNOSIS — F0391 Unspecified dementia with behavioral disturbance: Secondary | ICD-10-CM | POA: Diagnosis not present

## 2015-02-16 DIAGNOSIS — I252 Old myocardial infarction: Secondary | ICD-10-CM | POA: Diagnosis not present

## 2015-02-16 DIAGNOSIS — I10 Essential (primary) hypertension: Secondary | ICD-10-CM | POA: Insufficient documentation

## 2015-02-16 DIAGNOSIS — E785 Hyperlipidemia, unspecified: Secondary | ICD-10-CM | POA: Insufficient documentation

## 2015-02-16 DIAGNOSIS — F17210 Nicotine dependence, cigarettes, uncomplicated: Secondary | ICD-10-CM | POA: Insufficient documentation

## 2015-02-16 LAB — COMPREHENSIVE METABOLIC PANEL
ALK PHOS: 99 U/L (ref 38–126)
ALT: 17 U/L (ref 17–63)
ANION GAP: 10 (ref 5–15)
AST: 19 U/L (ref 15–41)
Albumin: 3.8 g/dL (ref 3.5–5.0)
BILIRUBIN TOTAL: 0.7 mg/dL (ref 0.3–1.2)
BUN: 41 mg/dL — ABNORMAL HIGH (ref 6–20)
CO2: 31 mmol/L (ref 22–32)
Calcium: 8.9 mg/dL (ref 8.9–10.3)
Chloride: 97 mmol/L — ABNORMAL LOW (ref 101–111)
Creatinine, Ser: 1.45 mg/dL — ABNORMAL HIGH (ref 0.61–1.24)
GFR calc Af Amer: 56 mL/min — ABNORMAL LOW (ref >60)
GFR, EST NON AFRICAN AMERICAN: 48 mL/min — AB (ref >60)
Glucose, Bld: 213 mg/dL — ABNORMAL HIGH (ref 65–99)
Potassium: 5.7 mmol/L — ABNORMAL HIGH (ref 3.5–5.1)
SODIUM: 138 mmol/L (ref 135–145)
Total Protein: 7.1 g/dL (ref 6.5–8.1)

## 2015-02-16 LAB — CBC WITH DIFFERENTIAL/PLATELET
BASOS ABS: 0 10*3/uL (ref 0.0–0.1)
Basophils Relative: 0 % (ref 0–1)
EOS PCT: 3 % (ref 0–5)
Eosinophils Absolute: 0.2 10*3/uL (ref 0.0–0.7)
HEMATOCRIT: 37.2 % — AB (ref 39.0–52.0)
Hemoglobin: 11.7 g/dL — ABNORMAL LOW (ref 13.0–17.0)
LYMPHS ABS: 1.5 10*3/uL (ref 0.7–4.0)
LYMPHS PCT: 22 % (ref 12–46)
MCH: 27.7 pg (ref 26.0–34.0)
MCHC: 31.5 g/dL (ref 30.0–36.0)
MCV: 87.9 fL (ref 78.0–100.0)
MONOS PCT: 11 % (ref 3–12)
Monocytes Absolute: 0.7 10*3/uL (ref 0.1–1.0)
Neutro Abs: 4.2 10*3/uL (ref 1.7–7.7)
Neutrophils Relative %: 64 % (ref 43–77)
Platelets: 126 10*3/uL — ABNORMAL LOW (ref 150–400)
RBC: 4.23 MIL/uL (ref 4.22–5.81)
RDW: 14.1 % (ref 11.5–15.5)
WBC: 6.5 10*3/uL (ref 4.0–10.5)

## 2015-02-16 LAB — APTT: aPTT: 33 seconds (ref 24–37)

## 2015-02-16 LAB — PROTIME-INR
INR: 1.05 (ref 0.00–1.49)
Prothrombin Time: 13.8 seconds (ref 11.6–15.2)

## 2015-02-16 LAB — GLUCOSE, CAPILLARY: GLUCOSE-CAPILLARY: 215 mg/dL — AB (ref 65–99)

## 2015-02-16 MED ORDER — SODIUM CHLORIDE 0.9 % IV SOLN
INTRAVENOUS | Status: DC
  Administered 2015-02-16: 12:00:00 via INTRAVENOUS

## 2015-02-16 MED ORDER — FENTANYL CITRATE (PF) 100 MCG/2ML IJ SOLN
INTRAMUSCULAR | Status: AC
  Filled 2015-02-16: qty 2

## 2015-02-16 MED ORDER — NALOXONE HCL 0.4 MG/ML IJ SOLN
INTRAMUSCULAR | Status: AC
  Filled 2015-02-16: qty 1

## 2015-02-16 MED ORDER — FLUMAZENIL 0.5 MG/5ML IV SOLN
INTRAVENOUS | Status: AC
  Filled 2015-02-16: qty 5

## 2015-02-16 MED ORDER — MIDAZOLAM HCL 2 MG/2ML IJ SOLN
INTRAMUSCULAR | Status: AC
  Filled 2015-02-16: qty 4

## 2015-02-16 MED ORDER — FENTANYL CITRATE (PF) 100 MCG/2ML IJ SOLN
INTRAMUSCULAR | Status: AC | PRN
Start: 2015-02-16 — End: 2015-02-16
  Administered 2015-02-16: 25 ug via INTRAVENOUS

## 2015-02-16 MED ORDER — MIDAZOLAM HCL 2 MG/2ML IJ SOLN
INTRAMUSCULAR | Status: AC | PRN
  Administered 2015-02-16: 1 mg via INTRAVENOUS

## 2015-02-16 NOTE — Procedures (Signed)
Successful RT HEPATIC DOME MASS 18G CORE BX NO COMP STABLE FULL REPORT IN PACS PATH PENDING

## 2015-02-16 NOTE — Sedation Documentation (Signed)
Pressure held at biopsy site by Dr. Annamaria Boots and Teena Dunk for 5 minutes.
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
## 2015-02-16 NOTE — Discharge Instructions (Signed)
Liver Biopsy, Care After °Refer to this sheet in the next few weeks. These instructions provide you with information on caring for yourself after your procedure. Your health care provider may also give you more specific instructions. Your treatment has been planned according to current medical practices, but problems sometimes occur. Call your health care provider if you have any problems or questions after your procedure. °WHAT TO EXPECT AFTER THE PROCEDURE °After your procedure, it is typical to have the following: °· A small amount of discomfort in the area where the biopsy was done and in the right shoulder or shoulder blade. °· A small amount of bruising around the area where the biopsy was done and on the skin over the liver. °· Sleepiness and fatigue for the rest of the day. °HOME CARE INSTRUCTIONS  °· Rest at home for 1-2 days or as directed by your health care provider. °· Have a friend or family member stay with you for at least 24 hours. °· Because of the medicines used during the procedure, you should not do the following things in the first 24 hours: °¨ Drive. °¨ Use machinery. °¨ Be responsible for the care of other people. °¨ Sign legal documents. °¨ Take a bath or shower. °· There are many different ways to close and cover an incision, including stitches, skin glue, and adhesive strips. Follow your health care provider's instructions on: °¨ Incision care. °¨ Bandage (dressing) changes and removal. °¨ Incision closure removal. °· Do not drink alcohol in the first week. °· Do not lift more than 5 pounds or play contact sports for 2 weeks after this test. °· Take medicines only as directed by your health care provider. Do not take medicine containing aspirin or non-steroidal anti-inflammatory medicines such as ibuprofen for 1 week after this test. °· It is your responsibility to get your test results. °SEEK MEDICAL CARE IF:  °· You have increased bleeding from an incision that results in more than a  small spot of blood. °· You have redness, swelling, or increasing pain in any incisions. °· You notice a discharge or a bad smell coming from any of your incisions. °· You have a fever or chills. °SEEK IMMEDIATE MEDICAL CARE IF:  °· You develop swelling, bloating, or pain in your abdomen. °· You become dizzy or faint. °· You develop a rash. °· You are nauseous or vomit. °· You have difficulty breathing, feel short of breath, or feel faint. °· You develop chest pain. °· You have problems with your speech or vision. °· You have trouble balancing or moving your arms or legs. °Document Released: 04/07/2005 Document Revised: 02/02/2014 Document Reviewed: 11/14/2013 °ExitCare® Patient Information ©2015 [employer]. This information is not intended to replace advice given to you by your health care provider. Make sure you discuss any questions you have with your health care provider. °Liver Biopsy °The liver is a large organ in the upper right-hand side of your abdomen. A liver biopsy is a procedure in which a tissue sample is taken from the liver and examined under a microscope. The procedure is done to confirm a suspected problem. °There are three types of liver biopsies: °· Percutaneous. In this type, an incision is made in your abdomen. The sample is removed through the incision with a needle. °· Laparoscopic. In this type, several incisions are made in the abdomen. A tiny camera is passed through one of the incisions to help guide the health care provider. The sample is removed through   the other incision or incisions. °· Transjugular. In this type, an incision is made in the neck. A tube is passed through the incision to the liver. The sample is removed through the tube with a needle. °LET YOUR HEALTH CARE PROVIDER KNOW ABOUT: °· Any allergies you have. °· All medicines you are taking, including vitamins, herbs, eye drops, creams, and over-the-counter medicines. °· Previous problems you or members of your family  have had with the use of anesthetics. °· Any blood disorders you have. °· Previous surgeries you have had. °· Medical conditions you have. °· Possibility of pregnancy, if this applies. °RISKS AND COMPLICATIONS °Generally, this is a safe procedure. However, problems can occur and include: °· Bleeding. °· Infection. °· Bruising. °· Collapsed lung. °· Leak of digestive juices (bile) from the liver or gallbladder. °· Problems with heart rhythm. °· Pain at the biopsy site or in the right shoulder. °· Low blood pressure (hypotension). °· Injury to nearby organs or tissues. °BEFORE THE PROCEDURE °· Your health care provider may do some blood or urine tests. These will help your health care provider learn how well your kidneys and liver are working and how well your blood clots. °· Ask your health care provider if you will be able to go home the day of the procedure. Arrange for someone to take you home and stay with you for at least 24 hours. °· Do not eat or drink anything after midnight on the night before the procedure or as directed by your health care provider. °· Ask your health care provider about: °· Changing or stopping your regular medicines. This is especially important if you are taking diabetes medicines or blood thinners. °· Taking medicines such as aspirin and ibuprofen. These medicines can thin your blood. Do not take these medicines before your procedure if your health care provider asks you not to. °PROCEDURE °Regardless of the type of biopsy that will be done, you will have an IV line placed. Through this line, you will receive fluids and medicine to relax you. If you will be having a laparoscopic biopsy, you may also receive medicine through this line to make you sleep during the procedure (general anesthetic). °Percutaneous Liver Biopsy °· You will positioned on your back, with your right hand over your head. °· A health care provider will locate your liver by tapping and pressing on the right side of  your abdomen or with the help of an ultrasound machine or CT scan. °· An area at the bottom of your last right rib will be numbed. °· An incision will be made in the numbed area. °· The biopsy needle will be inserted into the incision. °· Several samples of liver tissue will be taken with the biopsy needle. You will be asked to hold your breath as each sample is taken. °Laparoscopic Liver Biopsy °· You will be positioned on your back. °· Several small incisions will be made in your abdomen. °· Your doctor will pass a tiny camera through one incision. The camera will allow the liver to be viewed on a TV monitor in the operating room. °· Tools will be passed through the other incision or incisions. These tools will be used to remove samples of liver tissue. °Transjugular Liver Biopsy °· You will be positioned on your back on an X-ray table, with your head turned to your left. °· An area on your neck just over your jugular vein will be numbed. °· An incision will be made in the   numbed area. °· A tiny tube will be inserted through the incision. It will be pushed through the jugular vein to a blood vessel in the liver called the hepatic vein. °· Dye will be inserted through the tube, and X-rays will be taken. The dye will make the blood vessels in the liver light up on the X-rays. °· The biopsy needle will be pushed through the tube until it reaches the liver. °· Samples of liver tissue will be taken with the biopsy needle. °· The needle and the tube will be removed. °After the samples are obtained, the incision or incisions will be closed. °AFTER THE PROCEDURE °· You will be taken to a recovery area. °· You may have to lie on your right side for 1-2 hours. This will prevent bleeding from the biopsy site. °· Your progress will be watched. Your blood pressure, pulse, and the biopsy site will be checked often. °· You may have some pain or feel sick. If this happens, tell your health care provider. °· As you begin to feel  better, you will be offered ice and beverages. °· You may be allowed to go home when the medicines have worn off and you can walk, drink, eat, and use the bathroom. °Document Released: 12/09/2003 Document Revised: 02/02/2014 Document Reviewed: 11/14/2013 °ExitCare® Patient Information ©2015 [employer]. This information is not intended to replace advice given to you by your health care provider. Make sure you discuss any questions you have with your health care provider. °Conscious Sedation °Sedation is the use of medicines to promote relaxation and relieve discomfort and anxiety. Conscious sedation is a type of sedation. Under conscious sedation you are less alert than normal but are still able to respond to instructions or stimulation. Conscious sedation is used during short medical and dental procedures. It is milder than deep sedation or general anesthesia and allows you to return to your regular activities sooner.  °LET YOUR HEALTH CARE PROVIDER KNOW ABOUT:  °· Any allergies you have. °· All medicines you are taking, including vitamins, herbs, eye drops, creams, and over-the-counter medicines. °· Use of steroids (by mouth or creams). °· Previous problems you or members of your family have had with the use of anesthetics. °· Any blood disorders you have. °· Previous surgeries you have had. °· Medical conditions you have. °· Possibility of pregnancy, if this applies. °· Use of cigarettes, alcohol, or illegal drugs. °RISKS AND COMPLICATIONS °Generally, this is a safe procedure. However, as with any procedure, problems can occur. Possible problems include: °· Oversedation. °· Trouble breathing on your own. You may need to have a breathing tube until you are awake and breathing on your own. °· Allergic reaction to any of the medicines used for the procedure. °BEFORE THE PROCEDURE °· You may have blood tests done. These tests can help show how well your kidneys and liver are working. They can also show how well  your blood clots. °· A physical exam will be done.   °· Only take medicines as directed by your health care provider. You may need to stop taking medicines (such as blood thinners, aspirin, or nonsteroidal anti-inflammatory drugs) before the procedure.   °· Do not eat or drink at least 6 hours before the procedure or as directed by your health care provider. °· Arrange for a responsible adult, family member, or friend to take you home after the procedure. He or she should stay with you for at least 24 hours after the procedure, until the medicine has worn   off. °PROCEDURE  °· An intravenous (IV) catheter will be inserted into one of your veins. Medicine will be able to flow directly into your body through this catheter. You may be given medicine through this tube to help prevent pain and help you relax. °· The medical or dental procedure will be done. °AFTER THE PROCEDURE °· You will stay in a recovery area until the medicine has worn off. Your blood pressure and pulse will be checked.   °·  Depending on the procedure you had, you may be allowed to go home when you can tolerate liquids and your pain is under control. °Document Released: 06/13/2001 Document Revised: 09/23/2013 Document Reviewed: 05/26/2013 °ExitCare® Patient Information ©2015 [employer]. This information is not intended to replace advice given to you by your health care provider. Make sure you discuss any questions you have with your health care provider. ° °

## 2015-02-20 ENCOUNTER — Other Ambulatory Visit: Payer: Self-pay | Admitting: Hematology

## 2015-02-20 DIAGNOSIS — C220 Liver cell carcinoma: Secondary | ICD-10-CM

## 2015-02-22 ENCOUNTER — Telehealth: Payer: Self-pay | Admitting: Hematology

## 2015-02-22 NOTE — Telephone Encounter (Signed)
s.w. pt living facility and advised on on appt....they will call me back

## 2015-02-22 NOTE — Telephone Encounter (Signed)
s.w. pt living facility.Marland KitchenMarland KitchenMarland KitchenMarland Kitchenpt ok and aware

## 2015-03-08 ENCOUNTER — Encounter: Payer: Self-pay | Admitting: *Deleted

## 2015-03-08 ENCOUNTER — Other Ambulatory Visit (HOSPITAL_BASED_OUTPATIENT_CLINIC_OR_DEPARTMENT_OTHER): Payer: Medicare Other

## 2015-03-08 ENCOUNTER — Other Ambulatory Visit: Payer: Self-pay | Admitting: Oncology

## 2015-03-08 ENCOUNTER — Encounter: Payer: Medicare Other | Admitting: Oncology

## 2015-03-08 VITALS — BP 140/61 | HR 62 | Temp 97.7°F | Resp 17 | Ht 72.0 in

## 2015-03-08 DIAGNOSIS — K769 Liver disease, unspecified: Secondary | ICD-10-CM | POA: Diagnosis not present

## 2015-03-08 DIAGNOSIS — C220 Liver cell carcinoma: Secondary | ICD-10-CM

## 2015-03-08 LAB — COMPREHENSIVE METABOLIC PANEL (CC13)
ALBUMIN: 3.4 g/dL — AB (ref 3.5–5.0)
ALT: 13 U/L (ref 0–55)
AST: 17 U/L (ref 5–34)
Alkaline Phosphatase: 113 U/L (ref 40–150)
Anion Gap: 8 mEq/L (ref 3–11)
BUN: 38.5 mg/dL — AB (ref 7.0–26.0)
CALCIUM: 9.2 mg/dL (ref 8.4–10.4)
CHLORIDE: 97 meq/L — AB (ref 98–109)
CO2: 29 meq/L (ref 22–29)
CREATININE: 1.5 mg/dL — AB (ref 0.7–1.3)
EGFR: 46 mL/min/{1.73_m2} — ABNORMAL LOW (ref >90)
GLUCOSE: 288 mg/dL — AB (ref 70–140)
Potassium: 5.5 mEq/L — ABNORMAL HIGH (ref 3.5–5.1)
Sodium: 135 mEq/L — ABNORMAL LOW (ref 136–145)
Total Bilirubin: 0.56 mg/dL (ref 0.20–1.20)
Total Protein: 6.7 g/dL (ref 6.4–8.3)

## 2015-03-08 LAB — CBC WITH DIFFERENTIAL/PLATELET
BASO%: 1.2 % (ref 0.0–2.0)
BASOS ABS: 0.1 10*3/uL (ref 0.0–0.1)
EOS ABS: 0.1 10*3/uL (ref 0.0–0.5)
EOS%: 2.2 % (ref 0.0–7.0)
HCT: 37.9 % — ABNORMAL LOW (ref 38.4–49.9)
HGB: 12.4 g/dL — ABNORMAL LOW (ref 13.0–17.1)
LYMPH#: 1.3 10*3/uL (ref 0.9–3.3)
LYMPH%: 23.5 % (ref 14.0–49.0)
MCH: 27.8 pg (ref 27.2–33.4)
MCHC: 32.7 g/dL (ref 32.0–36.0)
MCV: 85 fL (ref 79.3–98.0)
MONO#: 0.6 10*3/uL (ref 0.1–0.9)
MONO%: 10.8 % (ref 0.0–14.0)
NEUT#: 3.5 10*3/uL (ref 1.5–6.5)
NEUT%: 62.3 % (ref 39.0–75.0)
Platelets: 146 10*3/uL (ref 140–400)
RBC: 4.45 10*6/uL (ref 4.20–5.82)
RDW: 15.1 % — ABNORMAL HIGH (ref 11.0–14.6)
WBC: 5.6 10*3/uL (ref 4.0–10.3)

## 2015-03-08 NOTE — [facility] Oncology Navigator Note (Signed)
Oncology Nurse Navigator Documentation  Oncology Nurse Navigator Flowsheets 03/08/2015  Navigator Encounter Type Face to face in lobby  Patient Visit Type Medonc  Treatment Phase Due review of bx and tx plan  Barriers/Navigation Needs No barriers at this time  Time Spent with Patient 10  Spoke with Alex Foster and his transporter in lobby. Scheduled to see NP today, however Dr. Burr Medico not in office today. Visit was to review pathology and decide next step in treatment. Explained to him this needs to occur with MD present, so we will need to reschedule his appointment. Will have labs done today and fax results to nursing center. Made patient aware that his biopsy did reveal [place], which is what was suspected. He understands and agrees to see MD when appointment can be rescheduled. Inquired if his brother will be able to make the next visit, and he said no, just call him.

## 2015-03-08 NOTE — Progress Notes (Signed)
Pt was not seen today. MD not in the office to discuss treatment plan. POF to scheduler to bring him back within 1 week to see MD.

## 2015-03-09 ENCOUNTER — Telehealth: Payer: Self-pay | Admitting: Hematology

## 2015-03-09 NOTE — Telephone Encounter (Signed)
Spoke with [facility] to confirm appointment for 06/14.

## 2015-03-10 ENCOUNTER — Telehealth: Payer: Self-pay | Admitting: *Deleted

## 2015-03-10 NOTE — Telephone Encounter (Signed)
Received call from [facility] requesting Dr Ernestina Penna consult note & U/S report.  Last ov note from Dr Burr Medico & U/S report & path faxed to [phone].

## 2015-03-16 ENCOUNTER — Ambulatory Visit (HOSPITAL_BASED_OUTPATIENT_CLINIC_OR_DEPARTMENT_OTHER): Payer: Medicare Other | Admitting: Hematology

## 2015-03-16 ENCOUNTER — Telehealth: Payer: Self-pay | Admitting: Hematology

## 2015-03-16 ENCOUNTER — Encounter: Payer: Self-pay | Admitting: Hematology

## 2015-03-16 VITALS — BP 144/61 | HR 74 | Temp 97.6°F | Resp 17 | Ht 72.0 in

## 2015-03-16 DIAGNOSIS — F1021 Alcohol dependence, in remission: Secondary | ICD-10-CM

## 2015-03-16 DIAGNOSIS — C220 Liver cell carcinoma: Secondary | ICD-10-CM

## 2015-03-16 NOTE — Addendum Note (Signed)
Addended by: Elray Buba LE on: 03/16/2015 05:38 PM   Modules accepted: Orders, Medications

## 2015-03-16 NOTE — Telephone Encounter (Signed)
Gave and printed appt sched adn avs fo rpt for SEpt
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
## 2015-03-23 ENCOUNTER — Non-Acute Institutional Stay (SKILLED_NURSING_FACILITY): Payer: Medicare Other | Admitting: Internal Medicine

## 2015-03-23 DIAGNOSIS — K7469 Other cirrhosis of liver: Secondary | ICD-10-CM | POA: Diagnosis not present

## 2015-03-23 DIAGNOSIS — C220 Liver cell carcinoma: Secondary | ICD-10-CM

## 2015-03-28 NOTE — Progress Notes (Addendum)
Patient ID: Alex Foster, male   DOB: [birth year], 69 y.o.   MRN: [id number]                PROGRESS NOTE  DATE:  03/23/2015              FACILITY: [name]                       LEVEL OF CARE:   SNF   Acute Visit                                CHIEF COMPLAINT:  Follow up hepatocellular carcinoma.    HISTORY OF PRESENT ILLNESS:  Alex Foster stopped me today to inquire about the status of his cancer.  Although I am sure that details were explained to him painstakingly, he wanted me to go over this with him.  I reviewed Dr. Rhea Foster last note from the [facility].     This is a patient whose tumor was discovered incidentally on CT scan imaging that was done to follow up on an abdominal aortic aneurysm.  He had a large mass in his liver.  Biopsy of this showed findings consistent with hepatocellular carcinoma.  His alpha fetoprotein level was within the normal limits.  Dr. Krista Foster did not think, and I agree, that the patient would be a candidate for surgery or a transplant.  Given the large size of his tumor, it was felt that chemoembolization or Y90 would obtain the best local control.  He has been referred to Interventional Radiology.  However, I am not sure where that referral is.    PHYSICAL EXAMINATION:   GENERAL APPEARANCE:  He has cirrhosis of the liver; however, very little manifestation of this clinically.  His liver function tests are normal.     CHEST/RESPIRATORY:  Clear air entry bilaterally.    CARDIOVASCULAR:   CARDIAC:  Heart sounds are normal.  There are no murmurs.    SKIN:   INSPECTION:  He has a small abscess on his back.  I think this can be managed locally with topical antibiotics.   ASSESSMENT/PLAN:                         Cirrhosis of the liver with underlying hepatocellular carcinoma.  He is being referred to Interventional Radiology for chemoembolization, I believe.  I am not sure where this referral is.    Renal insufficiency.  He tends to run a marginally  elevated potassium, most recently 5.3.  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  However, he is reasonably asymptomatic.

## 2015-04-04 NOTE — Addendum Note (Signed)
Addended by: Truitt Merle on: 04/04/2015 09:04 PM   Modules accepted: Orders

## 2015-04-08 ENCOUNTER — Telehealth: Payer: Self-pay | Admitting: Hematology

## 2015-04-08 NOTE — Telephone Encounter (Signed)
pt cld to r/s appt-gave pt r/s time & date °

## 2015-04-13 ENCOUNTER — Telehealth: Payer: Self-pay | Admitting: *Deleted

## 2015-04-13 ENCOUNTER — Other Ambulatory Visit: Payer: Self-pay | Admitting: *Deleted

## 2015-04-13 DIAGNOSIS — C220 Liver cell carcinoma: Secondary | ICD-10-CM

## 2015-04-13 MED ORDER — HYDROCODONE-ACETAMINOPHEN 5-325 MG PO TABS: ORAL_TABLET | ORAL | Status: DC

## 2015-04-13 NOTE — Telephone Encounter (Signed)
[facility] 

## 2015-04-13 NOTE — Telephone Encounter (Signed)
At request of MD, called IR to follow up on status of referral to IR for liver embolization. Per IR, if order placed as referral, it does not enter their work que. Need to enter as an order for "IR Rad Eval". Order entered-Tiffany will send to MD for review.

## 2015-04-20 ENCOUNTER — Non-Acute Institutional Stay (SKILLED_NURSING_FACILITY): Payer: Medicare Other | Admitting: Internal Medicine

## 2015-04-20 DIAGNOSIS — N183 Chronic kidney disease, stage 3 (moderate): Secondary | ICD-10-CM

## 2015-04-20 DIAGNOSIS — E1122 Type 2 diabetes mellitus with diabetic chronic kidney disease: Secondary | ICD-10-CM | POA: Diagnosis not present

## 2015-04-21 ENCOUNTER — Telehealth: Payer: Self-pay | Admitting: *Deleted

## 2015-04-21 ENCOUNTER — Ambulatory Visit: Payer: Self-pay | Admitting: Hematology

## 2015-04-21 ENCOUNTER — Other Ambulatory Visit: Payer: Self-pay

## 2015-04-21 NOTE — Telephone Encounter (Signed)
Oncology Nurse Navigator Documentation  Oncology Nurse Navigator Flowsheets 04/21/2015  Navigator Encounter Type Telephone  Barriers/Navigation Needs No barriers at this time  Interventions Coordination of Care  Coordination of Care MD Appointments  Called to cancel his L/OV today since he has not yet had the IR liver procedure. MD wants to see him afterwards. Will reschedule for 1 month and call back.

## 2015-04-22 ENCOUNTER — Other Ambulatory Visit (HOSPITAL_COMMUNITY): Payer: Self-pay | Admitting: Interventional Radiology

## 2015-04-22 ENCOUNTER — Ambulatory Visit
Admission: RE | Admit: 2015-04-22 | Discharge: 2015-04-22 | Disposition: A | Discharge status: Home / Self Care | Payer: Medicare Other | Source: Ambulatory Visit | Attending: Hematology | Admitting: Hematology

## 2015-04-22 DIAGNOSIS — K7460 Unspecified cirrhosis of liver: Secondary | ICD-10-CM

## 2015-04-22 DIAGNOSIS — C220 Liver cell carcinoma: Secondary | ICD-10-CM
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
## 2015-04-22 NOTE — Progress Notes (Addendum)
Patient ID: Alex Foster, male   DOB: 08/24/1946, 69 y.o.   MRN: [id number]                PROGRESS NOTE  DATE:  04/20/2015              FACILITY: [name]                  LEVEL OF CARE:   SNF   Acute Visit                   CHIEF COMPLAINT:  Severe hyperglycemia.      HISTORY OF PRESENT ILLNESS:  Alex Foster is a type 2 diabetic, on insulin, currently on Lantus insulin 36 U and a sliding scale.    I was given information about a blood sugar of over 500 yesterday and over 400 a.c. lunch today.  I have reviewed this.    The patient is not really complaining of anything.  He is going to Interventional Radiology tomorrow with regards to his hepatocellular carcinoma that was previously diagnosed.  He has some concern about this, but otherwise feels well.  He has not had any fevers, chills, dysuria, or chest pain.    REVIEW OF SYSTEMS:    CHEST/RESPIRATORY:  No shortness of breath.  No cough.    CARDIAC:  No chest pain.   GI:  No nausea or vomiting.    GU:  No dysuria.      PHYSICAL EXAMINATION:   CHEST/RESPIRATORY:  Clear air entry bilaterally.    CARDIOVASCULAR:   CARDIAC:  Heart sounds are normal.  There are no murmurs.  He appears to be euvolemic  GASTROINTESTINAL:   ABDOMEN:  Distended.   LIVER/SPLEEN/KIDNEYS:  No liver, no spleen.  No shifting dullness.   HERNIA:  He does have a periumbilical hernia.  However, this reduces easily.   GU: no suprpubic or costovertebral angle tenderness  ASSESSMENT/PLAN:                Type 2 diabetes.  Very poorly controlled.  The cause of this is not completely obvious, although he has put on roughly 10 pounds in the last year.  I am going to increase his Lantus insulin to 42 U.  I have also done away with the sliding scale and put him on NovoLog 6/8/8 U a.c. meals.  I have added a nighttime CBG check, but will not do a sliding scale at this time.

## 2015-04-23 ENCOUNTER — Telehealth: Payer: Self-pay | Admitting: Hematology

## 2015-04-23 NOTE — Telephone Encounter (Signed)
S/w Sheta (Nurse at [facility]) confirming labs/ov per 07/20 POF.... Cherylann Banas

## 2015-04-29 ENCOUNTER — Inpatient Hospital Stay (HOSPITAL_COMMUNITY)
Admission: RE | Admit: 2015-04-29 | Discharge: 2015-04-29 | Disposition: A | Discharge status: Home / Self Care | Payer: Self-pay | Source: Ambulatory Visit

## 2015-04-29 ENCOUNTER — Ambulatory Visit (HOSPITAL_COMMUNITY): Admission: RE | Admit: 2015-04-29 | Payer: Medicare Other | Source: Ambulatory Visit

## 2015-04-29 DIAGNOSIS — C220 Liver cell carcinoma: Secondary | ICD-10-CM

## 2015-04-29 DIAGNOSIS — K7460 Unspecified cirrhosis of liver: Secondary | ICD-10-CM

## 2015-05-03 ENCOUNTER — Other Ambulatory Visit: Payer: Self-pay | Admitting: *Deleted

## 2015-05-03 MED ORDER — HYDROCODONE-ACETAMINOPHEN 5-325 MG PO TABS: ORAL_TABLET | ORAL | Status: DC

## 2015-05-03 NOTE — Telephone Encounter (Signed)
[facility] 

## 2015-05-04 ENCOUNTER — Telehealth: Payer: Self-pay | Admitting: *Deleted

## 2015-05-04 ENCOUNTER — Other Ambulatory Visit: Payer: Self-pay | Admitting: *Deleted

## 2015-05-04 NOTE — Telephone Encounter (Signed)
Called & spoke with nurse/Sheta @ [place] & she reports that pt came for MRI but was claustrophobic & was told that scan needed to be r/s at [facility] [place].  Pt now refuses to do MRI & they are waiting for PCP to see if they can talk him into doing.  Labs were done there last week & she will fax over lab results.

## 2015-05-05 ENCOUNTER — Telehealth: Payer: Self-pay | Admitting: *Deleted

## 2015-05-05 NOTE — Telephone Encounter (Signed)
Received labs from [facility].  BUN 23, Creat, 1.25.  Labs sent to be scanned & called to [facility] for future MRI.

## 2015-05-06 ENCOUNTER — Other Ambulatory Visit: Payer: Self-pay | Admitting: *Deleted

## 2015-05-06 MED ORDER — LORAZEPAM 0.5 MG PO TABS: 0.5000 mg | ORAL_TABLET | Freq: Two times a day (BID) | ORAL | Status: AC | PRN

## 2015-05-06 NOTE — Telephone Encounter (Signed)
[facility] 

## 2015-05-10 ENCOUNTER — Other Ambulatory Visit (HOSPITAL_COMMUNITY): Payer: Self-pay | Admitting: Interventional Radiology

## 2015-05-10 DIAGNOSIS — C220 Liver cell carcinoma: Secondary | ICD-10-CM

## 2015-05-10 DIAGNOSIS — K7460 Unspecified cirrhosis of liver: Secondary | ICD-10-CM

## 2015-05-17 ENCOUNTER — Ambulatory Visit (HOSPITAL_COMMUNITY)
Admission: RE | Admit: 2015-05-17 | Discharge: 2015-05-17 | Disposition: A | Discharge status: Home / Self Care | Payer: Medicare Other | Source: Ambulatory Visit | Attending: Interventional Radiology | Admitting: Interventional Radiology

## 2015-05-17 DIAGNOSIS — K7460 Unspecified cirrhosis of liver: Secondary | ICD-10-CM

## 2015-05-17 DIAGNOSIS — C220 Liver cell carcinoma: Secondary | ICD-10-CM

## 2015-05-17 NOTE — Progress Notes (Signed)
Pt came in as an OP for MRI abdomen scan.  Evidently pt was reshed from [facility] due to pt's size.  Pt got in the scanner and 5 minutes into the exam started screaming to get out.  Pt absolutely refused further imaging.  Exam was aborted at this point.

## 2015-05-19 ENCOUNTER — Other Ambulatory Visit: Payer: Self-pay | Admitting: *Deleted

## 2015-05-19 MED ORDER — HYDROCODONE-ACETAMINOPHEN 5-325 MG PO TABS: ORAL_TABLET | ORAL | Status: DC

## 2015-05-19 NOTE — Telephone Encounter (Signed)
[facility] 

## 2015-05-27 ENCOUNTER — Other Ambulatory Visit: Payer: Self-pay

## 2015-06-01 ENCOUNTER — Other Ambulatory Visit: Payer: Self-pay

## 2015-06-01 ENCOUNTER — Telehealth: Payer: Self-pay | Admitting: Hematology

## 2015-06-01 ENCOUNTER — Ambulatory Visit: Payer: Self-pay | Admitting: Hematology

## 2015-06-01 NOTE — Telephone Encounter (Signed)
rehab cld stated no van to transport pt-r/s appt-gave r/s time & date

## 2015-06-04 ENCOUNTER — Encounter (HOSPITAL_COMMUNITY): Payer: Self-pay | Admitting: *Deleted

## 2015-06-04 NOTE — Progress Notes (Signed)
SDW call made to [facility] where patient is a resident.  I spoke with Graylon Good , RN.  Patient using a wheelchair due to unsteady gait. Patient is alert and oriented.

## 2015-06-08 ENCOUNTER — Ambulatory Visit: Payer: Self-pay | Admitting: Hematology

## 2015-06-08 ENCOUNTER — Ambulatory Visit (HOSPITAL_COMMUNITY): Payer: Medicare Other | Admitting: Anesthesiology

## 2015-06-08 ENCOUNTER — Ambulatory Visit (HOSPITAL_COMMUNITY)
Admission: RE | Admit: 2015-06-08 | Discharge: 2015-06-08 | Disposition: A | Discharge status: Home / Self Care | Payer: Medicare Other | Source: Ambulatory Visit | Attending: Interventional Radiology | Admitting: Interventional Radiology

## 2015-06-08 ENCOUNTER — Encounter (HOSPITAL_COMMUNITY)
Admission: RE | Disposition: A | Discharge status: Rehabilitation Facility | Payer: Self-pay | Source: Ambulatory Visit | Attending: Interventional Radiology

## 2015-06-08 ENCOUNTER — Other Ambulatory Visit: Payer: Self-pay | Admitting: *Deleted

## 2015-06-08 ENCOUNTER — Encounter (HOSPITAL_COMMUNITY): Payer: Self-pay | Admitting: *Deleted

## 2015-06-08 ENCOUNTER — Other Ambulatory Visit: Payer: Self-pay

## 2015-06-08 ENCOUNTER — Ambulatory Visit (HOSPITAL_COMMUNITY)
Admission: RE | Admit: 2015-06-08 | Discharge: 2015-06-08 | Disposition: A | Discharge status: Rehabilitation Facility | Payer: Medicare Other | Source: Ambulatory Visit | Attending: Interventional Radiology | Admitting: Interventional Radiology

## 2015-06-08 DIAGNOSIS — C220 Liver cell carcinoma: Secondary | ICD-10-CM | POA: Insufficient documentation

## 2015-06-08 DIAGNOSIS — I252 Old myocardial infarction: Secondary | ICD-10-CM | POA: Insufficient documentation

## 2015-06-08 DIAGNOSIS — Z993 Dependence on wheelchair: Secondary | ICD-10-CM | POA: Diagnosis not present

## 2015-06-08 DIAGNOSIS — K8020 Calculus of gallbladder without cholecystitis without obstruction: Secondary | ICD-10-CM | POA: Diagnosis not present

## 2015-06-08 DIAGNOSIS — K863 Pseudocyst of pancreas: Secondary | ICD-10-CM | POA: Insufficient documentation

## 2015-06-08 DIAGNOSIS — R162 Hepatomegaly with splenomegaly, not elsewhere classified: Secondary | ICD-10-CM | POA: Diagnosis present

## 2015-06-08 DIAGNOSIS — I10 Essential (primary) hypertension: Secondary | ICD-10-CM | POA: Insufficient documentation

## 2015-06-08 HISTORY — DX: Hypothyroidism, unspecified: E03.9

## 2015-06-08 HISTORY — DX: Malignant (primary) neoplasm, unspecified: C80.1

## 2015-06-08 HISTORY — DX: Urgency of urination: R39.15

## 2015-06-08 HISTORY — PX: RADIOLOGY WITH ANESTHESIA: SHX6223

## 2015-06-08 HISTORY — DX: Chronic kidney disease, unspecified: N18.9

## 2015-06-08 LAB — BASIC METABOLIC PANEL
ANION GAP: 6 (ref 5–15)
BUN: 28 mg/dL — AB (ref 6–20)
CHLORIDE: 97 mmol/L — AB (ref 101–111)
CO2: 29 mmol/L (ref 22–32)
Calcium: 8.8 mg/dL — ABNORMAL LOW (ref 8.9–10.3)
Creatinine, Ser: 1.33 mg/dL — ABNORMAL HIGH (ref 0.61–1.24)
GFR calc Af Amer: >60 mL/min (ref >60)
GFR calc non Af Amer: 54 mL/min — ABNORMAL LOW (ref >60)
Glucose, Bld: 179 mg/dL — ABNORMAL HIGH (ref 65–99)
POTASSIUM: 6.1 mmol/L — AB (ref 3.5–5.1)
SODIUM: 132 mmol/L — AB (ref 135–145)

## 2015-06-08 LAB — CBC
HEMATOCRIT: 36 % — AB (ref 39.0–52.0)
HEMOGLOBIN: 11.5 g/dL — AB (ref 13.0–17.0)
MCH: 26.6 pg (ref 26.0–34.0)
MCHC: 31.9 g/dL (ref 30.0–36.0)
MCV: 83.3 fL (ref 78.0–100.0)
Platelets: 134 10*3/uL — ABNORMAL LOW (ref 150–400)
RBC: 4.32 MIL/uL (ref 4.22–5.81)
RDW: 14.5 % (ref 11.5–15.5)
WBC: 5.7 10*3/uL (ref 4.0–10.5)

## 2015-06-08 LAB — POTASSIUM: Potassium: 5.8 mmol/L — ABNORMAL HIGH (ref 3.5–5.1)

## 2015-06-08 LAB — GLUCOSE, CAPILLARY
Glucose-Capillary: 167 mg/dL — ABNORMAL HIGH (ref 65–99)
Glucose-Capillary: 166 mg/dL — ABNORMAL HIGH (ref 65–99)

## 2015-06-08 SURGERY — RADIOLOGY WITH ANESTHESIA
Anesthesia: General

## 2015-06-08 MED ORDER — HYDROCODONE-ACETAMINOPHEN 5-325 MG PO TABS: ORAL_TABLET | ORAL | Status: DC

## 2015-06-08 MED ORDER — SODIUM CHLORIDE 0.9 % IV SOLN
INTRAVENOUS | Status: DC
  Administered 2015-06-08: 09:00:00 via INTRAVENOUS

## 2015-06-08 MED ORDER — GADOXETATE DISODIUM 0.25 MMOL/ML IV SOLN
12.0000 mL | Freq: Once | INTRAVENOUS | Status: AC | PRN
  Administered 2015-06-08: 12 mL via INTRAVENOUS

## 2015-06-08 NOTE — Progress Notes (Signed)
Verified with Felica, LPN at facility last meds and NPO status.

## 2015-06-08 NOTE — Progress Notes (Signed)
Pt accompanied by CNA from [place]. Attempted to call [name] and give report to nurse. Operator unable to get a staff member to come to phone. Left my name and number to call for report and for any questions/concerns. Pt accompanied by [facility] staff. Pt picked up by [facility]>

## 2015-06-08 NOTE — Telephone Encounter (Signed)
[facility] 

## 2015-06-08 NOTE — Progress Notes (Signed)
Dr. Jillyn Hidden made aware of repeat potassium no further orders.

## 2015-06-08 NOTE — Progress Notes (Signed)
Dr. Jillyn Hidden made aware of Critical potassium that was hemolyzed. Will redraw and and confirm.

## 2015-06-08 NOTE — Progress Notes (Signed)
Dr. Jillyn Hidden made aware of EKG. No further orders

## 2015-06-08 NOTE — Anesthesia Preprocedure Evaluation (Addendum)
Anesthesia Evaluation  Patient identified by MRN, date of birth, ID band Patient awake    Reviewed: Allergy & Precautions, NPO status , Patient's Chart, lab work & pertinent test results  History of Anesthesia Complications Negative for: history of anesthetic complications  Airway Mallampati: II  TM Distance: >3 FB Neck ROM: Full    Dental no notable dental hx. (+) Edentulous Upper, Edentulous Lower   Pulmonary COPDformer smoker,  breath sounds clear to auscultation  Pulmonary exam normal       Cardiovascular hypertension, Pt. on medications + Past MI and + Peripheral Vascular Disease Normal cardiovascular exam+ dysrhythmias (known 2nd degree av block, seen on previous ekg, asymptomatic however wheelchair bound) Rhythm:Regular Rate:Normal     Neuro/Psych PSYCHIATRIC DISORDERS Anxiety CVA    GI/Hepatic negative GI ROS, Neg liver ROS, GERD-  Medicated and Controlled,  Endo/Other  diabetesHypothyroidism   Renal/GU negative Renal ROS  negative genitourinary   Musculoskeletal negative musculoskeletal ROS (+)   Abdominal   Peds negative pediatric ROS (+)  Hematology negative hematology ROS (+)   Anesthesia Other Findings   Reproductive/Obstetrics negative OB ROS                           Anesthesia Physical Anesthesia Plan  ASA: III  Anesthesia Plan: General   Post-op Pain Management:    Induction: Intravenous  Airway Management Planned: Oral ETT  Additional Equipment:   Intra-op Plan:   Post-operative Plan:   Informed Consent: I have reviewed the patients History and Physical, chart, labs and discussed the procedure including the risks, benefits and alternatives for the proposed anesthesia with the patient or authorized representative who has indicated his/her understanding and acceptance.   Dental advisory given  Plan Discussed with:   Anesthesia Plan Comments: (Chronic  hyperkalemia, 5.8 today on nonhemolyzed sample.)      Anesthesia Quick Evaluation

## 2015-06-08 NOTE — Transfer of Care (Signed)
Immediate Anesthesia Transfer of Care Note  Patient: Alex Foster  Procedure(s) Performed: Procedure(s) with comments: MRI OF ABDOMEN WITH AND WITHOUT (N/A) - DR. SHICK/MRI  Patient Location: PACU  Anesthesia Type:General  Level of Consciousness: awake, oriented, patient cooperative and responds to stimulation  Airway & Oxygen Therapy: Patient Spontanous Breathing and Patient connected to nasal cannula oxygen  Post-op Assessment: Report given to RN and Post -op Vital signs reviewed and stable  Post vital signs: Reviewed and stable  Last Vitals:  Filed Vitals:   06/08/15 1245  BP: 130/58  Pulse: 76  Temp:   Resp:     Complications: No apparent anesthesia complications

## 2015-06-09 ENCOUNTER — Encounter (HOSPITAL_COMMUNITY): Payer: Self-pay | Admitting: Radiology

## 2015-06-10 ENCOUNTER — Other Ambulatory Visit: Payer: Self-pay | Admitting: *Deleted

## 2015-06-10 MED ORDER — HYDROCODONE-ACETAMINOPHEN 5-325 MG PO TABS: ORAL_TABLET | ORAL | Status: DC

## 2015-06-10 NOTE — Telephone Encounter (Signed)
[facility] 

## 2015-06-11 ENCOUNTER — Telehealth: Payer: Self-pay | Admitting: Hematology

## 2015-06-11 MED FILL — Ephedrine Sulf-NaCl Soln Pref Syr 50 MG/10ML-0.9% (5 MG/ML): INTRAVENOUS | Qty: 10 | Status: AC

## 2015-06-11 MED FILL — Propofol IV Emul 200 MG/20ML (10 MG/ML): INTRAVENOUS | Qty: 20 | Status: AC

## 2015-06-11 MED FILL — Rocuronium Bromide IV Soln 100 MG/10ML (10 MG/ML): INTRAVENOUS | Qty: 3 | Status: AC

## 2015-06-11 MED FILL — Neostigmine Methylsulfate IV Soln 10 MG/10 ML (1 MG/ML): INTRAVENOUS | Qty: 0.3 | Status: AC

## 2015-06-11 MED FILL — Lidocaine HCl IV Inj 20 MG/ML: INTRAVENOUS | Qty: 5 | Status: AC

## 2015-06-11 MED FILL — Fentanyl Citrate Preservative Free (PF) Inj 100 MCG/2ML: INTRAMUSCULAR | Qty: 2 | Status: AC

## 2015-06-11 MED FILL — Glycopyrrolate Inj 0.2 MG/ML: INTRAMUSCULAR | Qty: 2 | Status: AC

## 2015-06-11 MED FILL — Ondansetron HCl Inj 4 MG/2ML (2 MG/ML): INTRAMUSCULAR | Qty: 2 | Status: AC

## 2015-06-11 NOTE — Telephone Encounter (Signed)
pt cld to get appt time & date-gave appt time & date

## 2015-06-14 ENCOUNTER — Telehealth: Payer: Self-pay | Admitting: Hematology

## 2015-06-14 ENCOUNTER — Encounter: Payer: Self-pay | Admitting: *Deleted

## 2015-06-14 ENCOUNTER — Other Ambulatory Visit (HOSPITAL_BASED_OUTPATIENT_CLINIC_OR_DEPARTMENT_OTHER): Payer: Medicare Other

## 2015-06-14 ENCOUNTER — Ambulatory Visit (HOSPITAL_BASED_OUTPATIENT_CLINIC_OR_DEPARTMENT_OTHER): Payer: Medicare Other | Admitting: Hematology

## 2015-06-14 VITALS — BP 153/84 | HR 69 | Temp 97.6°F | Resp 17 | Ht 72.0 in

## 2015-06-14 DIAGNOSIS — E875 Hyperkalemia: Secondary | ICD-10-CM | POA: Diagnosis not present

## 2015-06-14 DIAGNOSIS — N189 Chronic kidney disease, unspecified: Secondary | ICD-10-CM

## 2015-06-14 DIAGNOSIS — C220 Liver cell carcinoma: Secondary | ICD-10-CM

## 2015-06-14 DIAGNOSIS — R600 Localized edema: Secondary | ICD-10-CM

## 2015-06-14 LAB — COMPREHENSIVE METABOLIC PANEL (CC13)
ALT: 25 U/L (ref 0–55)
ANION GAP: 7 meq/L (ref 3–11)
AST: 29 U/L (ref 5–34)
Albumin: 3.3 g/dL — ABNORMAL LOW (ref 3.5–5.0)
Alkaline Phosphatase: 180 U/L — ABNORMAL HIGH (ref 40–150)
BUN: 22.1 mg/dL (ref 7.0–26.0)
CALCIUM: 9 mg/dL (ref 8.4–10.4)
CHLORIDE: 93 meq/L — AB (ref 98–109)
CO2: 30 meq/L — AB (ref 22–29)
CREATININE: 1.6 mg/dL — AB (ref 0.7–1.3)
EGFR: 45 mL/min/{1.73_m2} — AB (ref >90)
Glucose: 411 mg/dl — ABNORMAL HIGH (ref 70–140)
Potassium: 6.1 mEq/L (ref 3.5–5.1)
Sodium: 130 mEq/L — ABNORMAL LOW (ref 136–145)
Total Bilirubin: 0.42 mg/dL (ref 0.20–1.20)
Total Protein: 6.4 g/dL (ref 6.4–8.3)

## 2015-06-14 LAB — CBC WITH DIFFERENTIAL/PLATELET
BASO%: 0.6 % (ref 0.0–2.0)
BASOS ABS: 0 10*3/uL (ref 0.0–0.1)
EOS ABS: 0.1 10*3/uL (ref 0.0–0.5)
EOS%: 1.4 % (ref 0.0–7.0)
HEMATOCRIT: 34.8 % — AB (ref 38.4–49.9)
HGB: 11 g/dL — ABNORMAL LOW (ref 13.0–17.1)
LYMPH#: 1 10*3/uL (ref 0.9–3.3)
LYMPH%: 16.6 % (ref 14.0–49.0)
MCH: 26.3 pg — AB (ref 27.2–33.4)
MCHC: 31.7 g/dL — AB (ref 32.0–36.0)
MCV: 82.8 fL (ref 79.3–98.0)
MONO#: 0.6 10*3/uL (ref 0.1–0.9)
MONO%: 10.8 % (ref 0.0–14.0)
NEUT#: 4.1 10*3/uL (ref 1.5–6.5)
NEUT%: 70.6 % (ref 39.0–75.0)
PLATELETS: 143 10*3/uL (ref 140–400)
RBC: 4.2 10*6/uL (ref 4.20–5.82)
RDW: 15.6 % — ABNORMAL HIGH (ref 11.0–14.6)
WBC: 5.7 10*3/uL (ref 4.0–10.3)
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
## 2015-06-14 NOTE — Progress Notes (Signed)
Oncology Nurse Navigator Documentation  Oncology Nurse Navigator Flowsheets 06/14/2015  Navigator Encounter Type 3 month  Patient Visit Type Medonc  Barriers/Navigation Needs Education  Education Preparing for Upcoming  Treatment--Bland embolization to liver 06/15/15  Interventions Confirmed appointment with his transporter and wrote instructions down for them to return to nursing center  Coordination of Care -  Time Spent with Patient 15

## 2015-06-14 NOTE — Telephone Encounter (Signed)
Gave and printd appt sched and avs for pt for OCT °

## 2015-06-15 ENCOUNTER — Other Ambulatory Visit: Payer: Self-pay | Admitting: Interventional Radiology

## 2015-06-15 ENCOUNTER — Ambulatory Visit
Admission: RE | Admit: 2015-06-15 | Discharge: 2015-06-15 | Disposition: A | Discharge status: Home / Self Care | Payer: Medicare Other | Source: Ambulatory Visit | Attending: Interventional Radiology | Admitting: Interventional Radiology

## 2015-06-15 DIAGNOSIS — C220 Liver cell carcinoma: Secondary | ICD-10-CM

## 2015-06-15 DIAGNOSIS — K7460 Unspecified cirrhosis of liver: Secondary | ICD-10-CM

## 2015-06-15 LAB — AFP TUMOR MARKER: AFP TUMOR MARKER: 4.3 ng/mL (ref <6.1)
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
## 2015-06-28 ENCOUNTER — Non-Acute Institutional Stay (SKILLED_NURSING_FACILITY): Payer: Medicare Other | Admitting: Internal Medicine

## 2015-06-28 ENCOUNTER — Other Ambulatory Visit: Payer: Self-pay | Admitting: Radiology

## 2015-06-28 DIAGNOSIS — E1122 Type 2 diabetes mellitus with diabetic chronic kidney disease: Secondary | ICD-10-CM

## 2015-06-28 DIAGNOSIS — J441 Chronic obstructive pulmonary disease with (acute) exacerbation: Secondary | ICD-10-CM | POA: Diagnosis not present

## 2015-06-28 DIAGNOSIS — C220 Liver cell carcinoma: Secondary | ICD-10-CM

## 2015-06-28 DIAGNOSIS — N183 Chronic kidney disease, stage 3 unspecified: Secondary | ICD-10-CM

## 2015-06-28 NOTE — Progress Notes (Signed)
Patient ID: Alex Foster, male   DOB: Jan 07, 1946, 69 y.o.   MRN: [id number] Facility; [name] Chief complaint; review of medical issues/[facility] visit/review of [facility] records History; Alex Foster continues to follow-up follow with his oncologist Dr. Burr Medico. He has underlying hepatocellular carcinoma. He had a recent MRI and September 6 [see results below]. Dr. Burr Medico had called me 2 or 3 weeks ago with regards to a potassium of 6.1 and a glucose value that was over 517 on a metabolic panel. I see that his insulin has been increased to. Most recent potassium in his record is 4.8 Current insulin is 50 units of Lantus at night Humalog 8 units before meals breakfast and lunch and 10 units before meals supper.   06/23/15 WBC  5.3 K/uL 4.0-10.5 Final  RBC  4.11 MIL/uL 4.22-5.81 L Final  Hemoglobin  10.7 g/dL 13.0-17.0 L Final  Hematocrit  34.0 % 39.0-52.0 L Final  MCV  82.7 fL 78.0-100.0 Final  MCH  26.0 pg 26.0-34.0 Final  MCHC  31.5 g/dL 30.0-36.0 Final  RDW  15.3 % 11.5-15.5 Final  Platelet Count  152 K/uL 150-400 Final  MPV  8.9 fL 8.6-12.4 Final  Comprehensive Metabolic Panel  Sodium  001 mmol/L 135-146 L Final  Potassium  4.8 mmol/L 3.5-5.3 Final  Chloride  96 mmol/L 98-110 L Final  CO2  28 mmol/L 20-31 Final  Glucose  195 mg/dL 65-99 H Final  BUN  21 mg/dL 7-25 Final  Creatinine  1.22 mg/dL 0.70-1.25 Final  Bilirubin, Total  0.4 mg/dL 0.2-1.2 Final  Alkaline Phosphatase  178 U/L 40-115 H Final  AST/SGOT  26 U/L 10-35 Final  ALT/SGPT  21 U/L 9-46 Final  Total Protein  5.8 g/dL 6.1-8.1 L Final  Albumin  3.4 g/dL 3.6-5.1 L Final  Calcium  8.3 mg/dL 8.6-10.3 L Final  ** Please note change in unit of measure and reference range(s). ** Hemoglobin A1c with eAG  Hemoglobin A1C  9.8 % <5.7 H Final          CLINICAL DATA:  Liver mass.   EXAM: MRI ABDOMEN WITHOUT AND WITH CONTRAST   TECHNIQUE: Multiplanar multisequence MR imaging of the abdomen was performed both  before and after the administration of intravenous contrast.   CONTRAST:  12 cc of Eovist   COMPARISON:  10/27/2014   FINDINGS: Lower chest: Dependent changes in atelectasis are noted in the lung bases.   Hepatobiliary: The liver appears cirrhotic. Numerous lesions are identified throughout both lobes of the liver. Lesions are best identified on the axial T2 fat saturated sequences. Index lesion within the lateral segment of left lobe measures 3.1 cm, image number 8/series 11. Previously 2.1 cm. There are numerous lesions involving segment 7 and 8 of the liver. These coalesce to to form a large mass measuring approximately 13.7 x 9.7 cm. Previously this measured approximately 7.7 x 9.6 cm. Lesion in segment 6 of the liver measures 2.2 cm, image 21/series 11. Previously 1.4 cm. The portal vein appears patent. Stones are noted within the gallbladder. No biliary dilatation.   Pancreas: There are several small, nonenhancing T2 hyperintense structures associated with the pancreas. The largest measures 1.1 cm, image number 17 of series 14. No suspicious enhancing pancreas lesions identified.   Spleen: The spleen measures 15 cm in length. No focal splenic lesion.   Adrenals/Urinary Tract: Normal appearance of the it adrenal glands. The left kidney is normal. There is mild atrophy of the right kidney.   Stomach/Bowel: Esophageal varices noted. The stomach  is otherwise unremarkable. The visualized upper abdominal bowel loops appear normal.   Vascular/Lymphatic: Infrarenal abdominal aortic aneurysm is partially visualized. Previously this measured up to 5.2 cm. No upper abdominal adenopathy.   Other: No free fluid or fluid collections identified within the upper abdomen.   Musculoskeletal: Normal signal identified from within the bone marrow.   IMPRESSION: 1. Numerous lesions are identified within both lobes of the liver compatible with a multicentric hepatocellular  carcinoma. 2. Splenomegaly and esophageal varices. 3. Abdominal aortic aneurysm 4. Small T2 hyperintense structures within the pancreas are noted, nonspecific. Favored to represent benign abnormalities such is pancreatic pseudocyst, branch duct IPMN, or serous cystadenoma. Attention on follow-up imaging recommended. 5. Gallstones.     Electronically Signed   By: Kerby Moors M.D.   On: 06/08/2015 13:27     Past Medical History  Diagnosis Date  . Hyperlipidemia   . COPD (chronic obstructive pulmonary disease)     Active smoker, has oxygen for nightime use.  Cannot tolerate using mask.  . Low back pain     Goes to [facility]  . H/O alcohol abuse   . Myocardial infarction   . Hypertension   . Diabetes mellitus   . 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 Extensive facial fractures 05/31/2012  . MRSA (methicillin resistant staphylococcus aureus) pneumonia 06/02/2012  . Cardiac arrest 06/02/2012  . Mobitz (type) II atrioventricular block 06/04/2012  . DVT of axillary vein, acute right 08/03/2012    Dx 06/17/2012 by Duplex   . COPD 05/08/2007    Pt continues to smoke.  He does not desire smoking cessation.  Currently he smokes about 1 1/2 ppd.     . Acute respiratory failure with hypoxia 06/02/2012  . Tracheobronchitis, MRSA 06/02/2012  . Pleural effusion 06/09/2012  . Type II or unspecified type diabetes mellitus with unspecified complication, uncontrolled 07/21/2013  . Hepatic encephalopathy 04/12/2011  . Leukocytosis 06/01/2012  . Lactic acidosis 06/01/2012  . Dizziness 07/18/2011  . Fall 11/04/2012  . Tracheostomy status 11/11/2012  . Thrombocytopenia 03/24/2011    Transient   . Multiple rib fractures 05/31/2012  . Multiple fractures of ribs of left side 11/04/2012  . OTHER DISORDERS OF BONE AND CARTILAGE OTHER 08/17/2010    Narcotics prescribed by outside clinic.     Marland Kitchen BENIGN POSITIONAL VERTIGO 08/10/2008    Pt has been complaining of dizziness or sensations that he will fall or run  into a wall.  His complaints date back to 2010-2011.  His orthostatics were negative.  He refused MRI as part of work up for TIA concerns.  I have referred him to Vestibular clinic.      Marland Kitchen Dementia with behavioral disturbance 04/18/2013  . Dysphagia, unspecified(787.20) 07/21/2013  . RBBB 02/03/2010    Qualifier: Diagnosis of  By: Aundra Dubin, MD, [place]    . HYPERTENSION, BENIGN ESSENTIAL, CONTROLLED 05/08/2007    Qualifier: Diagnosis of  By: Laverle Hobby MD, JOSEPH    . Hypothyroidism   . Cancer     Hepatic  . Chronic kidney disease     CRF  . Stroke 2005  . Urgency of urination     Past Surgical History  Procedure Laterality Date  . Transthoracic echocardiogram  01/2010    EF 60%, mild LVH, mild RV dilation with normal systolic function  . Nm myoview ltd  01/2010    Lexixan myoview: EF 67%, no ischemia or infarction  . Abi  01/2006    0.93 Normal  . Anteriolisthesis  06/26/2006  . Electrocardiogram  01/02/2006    1st degree block  . Cataract extraction, bilateral      per patient  . Percutaneous tracheostomy  11/04/2012    Procedure: PERCUTANEOUS TRACHEOSTOMY;  Surgeon: Gwenyth Ober, MD;  Location: [facility];  Service: General;  Laterality: N/A;  . Radiology with anesthesia N/A 06/08/2015    Procedure: MRI OF ABDOMEN WITH AND WITHOUT;  Surgeon: Medication Radiologist, MD;  Location: [facility];  Service: Radiology;  Laterality: N/A;  DR. SHICK/MRI    Current medications Ferrous sulfate 325 daily Os-Cal 500+ D1 tablet daily Vitamin D3 5000 units daily Ecotrin 81 daily Exelon 9.5 daily Celexa 20 daily Lasix 40 daily Protonix 40 daily Depakote 250 every morning and 500 at night Prinivil 5 daily Lovaza 1 g daily Synthroid 75 g daily Norco 5/325 every morning Namenda 5 twice a day Advair 250 one puff every 12 Lipitor 5 daily   Review of systems Gen. states he does not feel too bad Respiratory no shortness of breath Cardiac no chest pain GI no abdominal pain nausea vomiting or diarrhea GU no  dysuria Extremities note that he has had the edema in his legs, a portable echocardiogram was ordered although I don't see those results.  Physical examination Gen. patient is not in any distress Vitals weight 277 pounds BP 138/78 respirations 18 temperature 97.8 pulse 80 Respiratory; clear entry bilaterally Cardiac heart sounds are normal no murmurs JVP is not elevated Abdomen; very distended. Small umbilical hernia no liver no spleen no shifting dullness is appreciated GU; bladder is not distended no CVA tenderness Extremities; mild edema bilaterally no evidence of a DVT. Mental status; I'm not certain I see the abnormalities that are currently been seen by other practitioners I don't see depression. Nor do I really see major cognitive abnormalities that would support Aricept and Namenda  Impression/plan #1 hepatocellular carcinoma. He is going for a laser ablation. I'm sure his prognosis is probably measured in the 6-12 month time frame he has underlying cirrhosis as well as. #2 type 2 diabetes; this is very poorly controlled his insulin has been adjusted. #3 episodic hyperkalemia. This could be hemolyzed specimen a [facility] or he could have type IV renal tubular acidosis #4 lower extremity edema. I see no evidence of heart failure at the bedside I note an echocardiogram has been done. #5 I'm not certain I see the need for a lot of his current psychiatric and cognitive medications. I'll talk to the [facility] people about this. #6 COPD this does not appear to be unstable #7 hypothyroidism on replacement TSH was recently checked

## 2015-06-29 ENCOUNTER — Non-Acute Institutional Stay (SKILLED_NURSING_FACILITY): Payer: Medicare Other | Admitting: Internal Medicine

## 2015-06-29 ENCOUNTER — Other Ambulatory Visit: Payer: Self-pay | Admitting: Radiology

## 2015-06-29 ENCOUNTER — Other Ambulatory Visit: Payer: Self-pay | Admitting: Interventional Radiology

## 2015-06-29 DIAGNOSIS — L02416 Cutaneous abscess of left lower limb: Secondary | ICD-10-CM | POA: Diagnosis not present

## 2015-06-29 MED ORDER — DOXORUBICIN HCL 50 MG IV SOLR
150.0000 mg | Freq: Once | INTRAVENOUS | Status: AC
  Administered 2015-06-30: 150 mg via INTRA_ARTERIAL
  Filled 2015-06-29 (×2): qty 150

## 2015-06-30 ENCOUNTER — Observation Stay (HOSPITAL_COMMUNITY)
Admission: RE | Admit: 2015-06-30 | Discharge: 2015-07-02 | Disposition: A | Discharge status: Home / Self Care | Payer: Medicare Other | Source: Ambulatory Visit | Attending: Interventional Radiology | Admitting: Interventional Radiology

## 2015-06-30 ENCOUNTER — Other Ambulatory Visit: Payer: Self-pay | Admitting: Interventional Radiology

## 2015-06-30 ENCOUNTER — Ambulatory Visit (HOSPITAL_COMMUNITY)
Admission: RE | Admit: 2015-06-30 | Discharge: 2015-06-30 | Disposition: A | Discharge status: Home / Self Care | Payer: Medicare Other | Source: Ambulatory Visit | Attending: Interventional Radiology | Admitting: Interventional Radiology

## 2015-06-30 ENCOUNTER — Encounter (HOSPITAL_COMMUNITY): Payer: Self-pay

## 2015-06-30 DIAGNOSIS — R112 Nausea with vomiting, unspecified: Secondary | ICD-10-CM | POA: Insufficient documentation

## 2015-06-30 DIAGNOSIS — Z79899 Other long term (current) drug therapy: Secondary | ICD-10-CM | POA: Insufficient documentation

## 2015-06-30 DIAGNOSIS — K7460 Unspecified cirrhosis of liver: Secondary | ICD-10-CM | POA: Diagnosis not present

## 2015-06-30 DIAGNOSIS — Z993 Dependence on wheelchair: Secondary | ICD-10-CM | POA: Diagnosis not present

## 2015-06-30 DIAGNOSIS — C220 Liver cell carcinoma: Secondary | ICD-10-CM

## 2015-06-30 DIAGNOSIS — Z23 Encounter for immunization: Secondary | ICD-10-CM | POA: Insufficient documentation

## 2015-06-30 DIAGNOSIS — Z7982 Long term (current) use of aspirin: Secondary | ICD-10-CM | POA: Diagnosis not present

## 2015-06-30 DIAGNOSIS — Z794 Long term (current) use of insulin: Secondary | ICD-10-CM | POA: Insufficient documentation

## 2015-06-30 LAB — GLUCOSE, CAPILLARY
GLUCOSE-CAPILLARY: 185 mg/dL — AB (ref 65–99)
Glucose-Capillary: 223 mg/dL — ABNORMAL HIGH (ref 65–99)
Glucose-Capillary: 359 mg/dL — ABNORMAL HIGH (ref 65–99)

## 2015-06-30 LAB — CBC WITH DIFFERENTIAL/PLATELET
Basophils Absolute: 0 10*3/uL (ref 0.0–0.1)
Basophils Relative: 0 %
Eosinophils Absolute: 0.1 10*3/uL (ref 0.0–0.7)
Eosinophils Relative: 1 %
HEMATOCRIT: 35.1 % — AB (ref 39.0–52.0)
HEMOGLOBIN: 11.3 g/dL — AB (ref 13.0–17.0)
LYMPHS ABS: 1 10*3/uL (ref 0.7–4.0)
Lymphocytes Relative: 13 %
MCH: 26.6 pg (ref 26.0–34.0)
MCHC: 32.2 g/dL (ref 30.0–36.0)
MCV: 82.6 fL (ref 78.0–100.0)
MONOS PCT: 12 %
Monocytes Absolute: 0.9 10*3/uL (ref 0.1–1.0)
NEUTROS ABS: 5.5 10*3/uL (ref 1.7–7.7)
NEUTROS PCT: 74 %
Platelets: 150 10*3/uL (ref 150–400)
RBC: 4.25 MIL/uL (ref 4.22–5.81)
RDW: 14.7 % (ref 11.5–15.5)
WBC: 7.5 10*3/uL (ref 4.0–10.5)

## 2015-06-30 LAB — COMPREHENSIVE METABOLIC PANEL
ALBUMIN: 3.6 g/dL (ref 3.5–5.0)
ALK PHOS: 173 U/L — AB (ref 38–126)
ALT: 25 U/L (ref 17–63)
ANION GAP: 7 (ref 5–15)
AST: 34 U/L (ref 15–41)
BILIRUBIN TOTAL: 0.8 mg/dL (ref 0.3–1.2)
BUN: 24 mg/dL — ABNORMAL HIGH (ref 6–20)
CALCIUM: 8.6 mg/dL — AB (ref 8.9–10.3)
CO2: 29 mmol/L (ref 22–32)
Chloride: 95 mmol/L — ABNORMAL LOW (ref 101–111)
Creatinine, Ser: 1.24 mg/dL (ref 0.61–1.24)
GFR, EST NON AFRICAN AMERICAN: 58 mL/min — AB (ref >60)
GLUCOSE: 183 mg/dL — AB (ref 65–99)
Potassium: 5.3 mmol/L — ABNORMAL HIGH (ref 3.5–5.1)
Sodium: 131 mmol/L — ABNORMAL LOW (ref 135–145)
TOTAL PROTEIN: 7 g/dL (ref 6.5–8.1)

## 2015-06-30 LAB — MRSA PCR SCREENING: MRSA by PCR: NEGATIVE

## 2015-06-30 LAB — APTT: APTT: 32 s (ref 24–37)

## 2015-06-30 LAB — PROTIME-INR
INR: 1.09 (ref 0.00–1.49)
PROTHROMBIN TIME: 14.3 s (ref 11.6–15.2)

## 2015-06-30 MED ORDER — ATORVASTATIN CALCIUM 10 MG PO TABS
10.0000 mg | ORAL_TABLET | Freq: Every day | ORAL | Status: DC
Start: 2015-06-30 — End: 2015-07-02
  Administered 2015-06-30 – 2015-07-02 (×3): 10 mg via ORAL
  Filled 2015-06-30 (×3): qty 1

## 2015-06-30 MED ORDER — SODIUM CHLORIDE 0.9 % IV SOLN
INTRAVENOUS | Status: DC
  Administered 2015-06-30: 14:00:00 via INTRAVENOUS
  Administered 2015-07-01: 75 mL/h via INTRAVENOUS
  Administered 2015-07-01: 07:00:00 via INTRAVENOUS

## 2015-06-30 MED ORDER — FERROUS SULFATE 325 (65 FE) MG PO TABS
325.0000 mg | ORAL_TABLET | Freq: Every day | ORAL | Status: DC
  Administered 2015-07-01 – 2015-07-02 (×2): 325 mg via ORAL
  Filled 2015-06-30 (×2): qty 1

## 2015-06-30 MED ORDER — FENTANYL CITRATE (PF) 100 MCG/2ML IJ SOLN
INTRAMUSCULAR | Status: AC | PRN
  Administered 2015-06-30 (×2): 50 ug via INTRAVENOUS

## 2015-06-30 MED ORDER — CIPROFLOXACIN IN D5W 400 MG/200ML IV SOLN
400.0000 mg | Freq: Once | INTRAVENOUS | Status: AC
  Administered 2015-06-30: 400 mg via INTRAVENOUS

## 2015-06-30 MED ORDER — MIDAZOLAM HCL 2 MG/2ML IJ SOLN
INTRAMUSCULAR | Status: AC
  Filled 2015-06-30: qty 6

## 2015-06-30 MED ORDER — FUROSEMIDE 20 MG PO TABS
20.0000 mg | ORAL_TABLET | Freq: Every day | ORAL | Status: DC
  Administered 2015-06-30 – 2015-07-02 (×3): 20 mg via ORAL
  Filled 2015-06-30 (×3): qty 1

## 2015-06-30 MED ORDER — INSULIN GLARGINE 100 UNIT/ML ~~LOC~~ SOLN
42.0000 [IU] | Freq: Every day | SUBCUTANEOUS | Status: DC
Start: 2015-06-30 — End: 2015-07-02
  Administered 2015-06-30 – 2015-07-01 (×2): 42 [IU] via SUBCUTANEOUS
  Filled 2015-06-30 (×2): qty 0.42

## 2015-06-30 MED ORDER — PANTOPRAZOLE SODIUM 40 MG PO TBEC
40.0000 mg | DELAYED_RELEASE_TABLET | Freq: Every morning | ORAL | Status: DC
  Administered 2015-06-30 – 2015-07-02 (×3): 40 mg via ORAL
  Filled 2015-06-30 (×3): qty 1

## 2015-06-30 MED ORDER — LORAZEPAM 0.5 MG PO TABS
0.5000 mg | ORAL_TABLET | Freq: Two times a day (BID) | ORAL | Status: DC | PRN
  Administered 2015-06-30: 0.5 mg via ORAL
  Filled 2015-06-30: qty 1

## 2015-06-30 MED ORDER — DEXAMETHASONE SODIUM PHOSPHATE 10 MG/ML IJ SOLN
INTRAMUSCULAR | Status: AC
  Filled 2015-06-30: qty 1

## 2015-06-30 MED ORDER — LIDOCAINE HCL 1 % IJ SOLN
INTRAMUSCULAR | Status: AC
  Filled 2015-06-30: qty 20

## 2015-06-30 MED ORDER — MIDAZOLAM HCL 2 MG/2ML IJ SOLN
INTRAMUSCULAR | Status: AC | PRN
  Administered 2015-06-30 (×3): 1 mg via INTRAVENOUS

## 2015-06-30 MED ORDER — FENTANYL CITRATE (PF) 100 MCG/2ML IJ SOLN
INTRAMUSCULAR | Status: AC
  Filled 2015-06-30: qty 4

## 2015-06-30 MED ORDER — CITALOPRAM HYDROBROMIDE 20 MG PO TABS
10.0000 mg | ORAL_TABLET | Freq: Every day | ORAL | Status: DC
Start: 2015-06-30 — End: 2015-07-02
  Administered 2015-06-30 – 2015-07-02 (×3): 10 mg via ORAL
  Filled 2015-06-30 (×3): qty 1

## 2015-06-30 MED ORDER — INFLUENZA VAC SPLIT QUAD 0.5 ML IM SUSY
0.5000 mL | PREFILLED_SYRINGE | INTRAMUSCULAR | Status: AC
  Administered 2015-07-01: 0.5 mL via INTRAMUSCULAR
  Filled 2015-06-30 (×2): qty 0.5

## 2015-06-30 MED ORDER — CALCIUM CARBONATE-VITAMIN D 500-200 MG-UNIT PO TABS
1.0000 | ORAL_TABLET | Freq: Every day | ORAL | Status: DC
  Administered 2015-07-01 – 2015-07-02 (×2): 1 via ORAL
  Filled 2015-06-30 (×2): qty 1

## 2015-06-30 MED ORDER — MEMANTINE HCL 5 MG PO TABS
5.0000 mg | ORAL_TABLET | Freq: Two times a day (BID) | ORAL | Status: DC
  Administered 2015-06-30 – 2015-07-02 (×5): 5 mg via ORAL
  Filled 2015-06-30 (×9): qty 1

## 2015-06-30 MED ORDER — METRONIDAZOLE IN NACL 5-0.79 MG/ML-% IV SOLN
500.0000 mg | Freq: Once | INTRAVENOUS | Status: AC
  Administered 2015-06-30: 500 mg via INTRAVENOUS
  Filled 2015-06-30: qty 100

## 2015-06-30 MED ORDER — PROMETHAZINE HCL 25 MG/ML IJ SOLN
25.0000 mg | Freq: Four times a day (QID) | INTRAMUSCULAR | Status: DC | PRN
  Administered 2015-06-30 – 2015-07-02 (×4): 25 mg via INTRAVENOUS
  Filled 2015-06-30 (×4): qty 1

## 2015-06-30 MED ORDER — DIVALPROEX SODIUM ER 250 MG PO TB24
250.0000 mg | ORAL_TABLET | Freq: Every day | ORAL | Status: DC
  Administered 2015-06-30 – 2015-07-02 (×3): 250 mg via ORAL
  Filled 2015-06-30 (×5): qty 1

## 2015-06-30 MED ORDER — ONDANSETRON HCL 4 MG/2ML IJ SOLN
INTRAMUSCULAR | Status: AC
  Filled 2015-06-30: qty 2

## 2015-06-30 MED ORDER — INSULIN ASPART 100 UNIT/ML ~~LOC~~ SOLN
3.0000 [IU] | Freq: Every day | SUBCUTANEOUS | Status: DC
  Administered 2015-06-30: 3 [IU] via SUBCUTANEOUS

## 2015-06-30 MED ORDER — LISINOPRIL 10 MG PO TABS
5.0000 mg | ORAL_TABLET | Freq: Every day | ORAL | Status: DC
  Administered 2015-06-30 – 2015-07-02 (×3): 5 mg via ORAL
  Filled 2015-06-30 (×3): qty 1

## 2015-06-30 MED ORDER — ONDANSETRON HCL 4 MG/2ML IJ SOLN
4.0000 mg | Freq: Once | INTRAMUSCULAR | Status: AC
  Administered 2015-06-30: 4 mg via INTRAVENOUS

## 2015-06-30 MED ORDER — OXYCODONE HCL 5 MG PO TABS
5.0000 mg | ORAL_TABLET | ORAL | Status: DC | PRN
  Administered 2015-06-30 – 2015-07-01 (×2): 10 mg via ORAL
  Administered 2015-07-01: 5 mg via ORAL
  Filled 2015-06-30 (×3): qty 2
  Filled 2015-06-30: qty 1

## 2015-06-30 MED ORDER — SODIUM CHLORIDE 0.9 % IV SOLN
INTRAVENOUS | Status: DC
  Administered 2015-06-30: 08:00:00 via INTRAVENOUS

## 2015-06-30 MED ORDER — ASPIRIN EC 81 MG PO TBEC
81.0000 mg | DELAYED_RELEASE_TABLET | Freq: Every day | ORAL | Status: DC
  Administered 2015-06-30 – 2015-07-02 (×3): 81 mg via ORAL
  Filled 2015-06-30 (×3): qty 1

## 2015-06-30 MED ORDER — INSULIN ASPART 100 UNIT/ML ~~LOC~~ SOLN
6.0000 [IU] | Freq: Two times a day (BID) | SUBCUTANEOUS | Status: DC
  Administered 2015-07-01 – 2015-07-02 (×3): 6 [IU] via SUBCUTANEOUS

## 2015-06-30 MED ORDER — DEXAMETHASONE SODIUM PHOSPHATE 10 MG/ML IJ SOLN
4.0000 mg | Freq: Once | INTRAMUSCULAR | Status: AC
  Administered 2015-06-30: 4 mg via INTRAVENOUS

## 2015-06-30 MED ORDER — IOHEXOL 300 MG/ML  SOLN
250.0000 mL | Freq: Once | INTRAMUSCULAR | Status: DC | PRN
  Administered 2015-06-30: 141 mL via INTRAVENOUS
  Filled 2015-06-30: qty 250

## 2015-06-30 MED ORDER — VITAMIN D 1000 UNITS PO TABS
5000.0000 [IU] | ORAL_TABLET | Freq: Every day | ORAL | Status: DC
  Administered 2015-07-01 – 2015-07-02 (×2): 5000 [IU] via ORAL
  Filled 2015-06-30 (×2): qty 5

## 2015-06-30 MED ORDER — MOMETASONE FURO-FORMOTEROL FUM 100-5 MCG/ACT IN AERO
2.0000 | INHALATION_SPRAY | Freq: Two times a day (BID) | RESPIRATORY_TRACT | Status: DC
  Administered 2015-06-30 – 2015-07-02 (×4): 2 via RESPIRATORY_TRACT
  Filled 2015-06-30: qty 8.8

## 2015-06-30 MED ORDER — MORPHINE SULFATE (PF) 2 MG/ML IV SOLN
1.0000 mg | INTRAVENOUS | Status: DC | PRN
  Administered 2015-06-30: 2 mg via INTRAVENOUS
  Filled 2015-06-30: qty 1

## 2015-06-30 MED ORDER — OMEGA-3-ACID ETHYL ESTERS 1 G PO CAPS
1.0000 g | ORAL_CAPSULE | Freq: Every day | ORAL | Status: DC
Start: 2015-06-30 — End: 2015-07-02
  Administered 2015-06-30 – 2015-07-02 (×3): 1 g via ORAL
  Filled 2015-06-30 (×3): qty 1

## 2015-06-30 MED ORDER — LEVOTHYROXINE SODIUM 25 MCG PO TABS
75.0000 ug | ORAL_TABLET | Freq: Every day | ORAL | Status: DC
  Administered 2015-07-01 – 2015-07-02 (×2): 75 ug via ORAL
  Filled 2015-06-30 (×2): qty 3

## 2015-06-30 MED ORDER — HYDROCODONE-ACETAMINOPHEN 5-325 MG PO TABS
1.0000 | ORAL_TABLET | ORAL | Status: DC | PRN
  Administered 2015-06-30: 1 via ORAL
  Filled 2015-06-30: qty 1

## 2015-06-30 MED ORDER — CIPROFLOXACIN IN D5W 400 MG/200ML IV SOLN
INTRAVENOUS | Status: AC
  Filled 2015-06-30: qty 200

## 2015-06-30 MED ORDER — DIVALPROEX SODIUM ER 500 MG PO TB24
500.0000 mg | ORAL_TABLET | Freq: Every day | ORAL | Status: DC
  Administered 2015-06-30 – 2015-07-01 (×2): 500 mg via ORAL
  Filled 2015-06-30 (×4): qty 1

## 2015-06-30 MED ORDER — OXYCODONE HCL 5 MG PO TABS: 5.0000 mg | ORAL_TABLET | ORAL | Status: DC | PRN

## 2015-06-30 NOTE — Sedation Documentation (Signed)
5 Fr sheath removed from R femoral artery by Cherlyn Labella, RTR. Hemostasis achieved using manual pressure. Groin level 0, RDP +Doppler.

## 2015-06-30 NOTE — H&P (Signed)
Referring Physician(s): Feng,Y  Chief Complaint:  Hepatocellular carcinoma  Subjective: Pt familiar to IR service from prior liver lesion biopsy on 02/16/15 and recent consultation for treatment options of [place] on 06/15/15. He was seen by Dr. Annamaria Boots and deemed an appropriate candidate for hepatic DEB-TACE. He presents today for the above procedure. He denies recent fevers, chills, HA,CP,dyspnea, cough, or abnormal bleeding. He does have occ N/V , abdominal pain. He is wheelchair bound.     Allergies: Penicillins and Tomato  Medications: Prior to Admission medications   Medication Sig Start Date End Date Taking? Authorizing Provider  aspirin 81 MG tablet Take 81 mg by mouth daily. Enteric Coated.   Yes Historical Provider, MD  atorvastatin (LIPITOR) 10 MG tablet Take 10 mg by mouth daily.   Yes Historical Provider, MD  calcium-vitamin D (OSCAL WITH D) 500-200 MG-UNIT per tablet Take 1 tablet by mouth daily with breakfast.   Yes Historical Provider, MD  Cholecalciferol (VITAMIN D3) 5000 UNITS CAPS Take 1 capsule by mouth daily.   Yes Historical Provider, MD  citalopram (CELEXA) 10 MG tablet Take 10 mg by mouth daily.   Yes Historical Provider, MD  divalproex (DEPAKOTE ER) 250 MG 24 hr tablet Take 250-500 mg by mouth 2 (two) times daily. 250mg  in the morning, 500mg  in the evening   Yes Historical Provider, MD  ferrous sulfate 325 (65 FE) MG tablet Take 325 mg by mouth daily with breakfast.   Yes Historical Provider, MD  Fluticasone-Salmeterol (ADVAIR) 250-50 MCG/DOSE AEPB Inhale 1 puff into the lungs 2 (two) times daily.   Yes Historical Provider, MD  furosemide (LASIX) 20 MG tablet Take 20 mg by mouth daily. 08/19/13  Yes Gerlene Fee, NP  HYDROcodone-acetaminophen (NORCO/VICODIN) 5-325 MG per tablet Take one tablet by mouth every morning for pain. Do not exceed 4gm of Tylenol in 24 hours 06/10/15  Yes Lauree Chandler, NP  insulin glargine (LANTUS) 100 UNIT/ML injection Inject 42  Units into the skin at bedtime.   Yes Historical Provider, MD  insulin lispro (HUMALOG) 100 UNIT/ML injection Inject 3-6 Units into the skin 3 (three) times daily before meals. 6 units before breakfast, 6 units before lunch, 3 units before dinner   Yes Historical Provider, MD  levothyroxine (SYNTHROID, LEVOTHROID) 75 MCG tablet Take 75 mcg by mouth daily before breakfast.   Yes Historical Provider, MD  lisinopril (PRINIVIL,ZESTRIL) 5 MG tablet Take 5 mg by mouth daily.   Yes Historical Provider, MD  LORazepam (ATIVAN) 0.5 MG tablet Take 1 tablet (0.5 mg total) by mouth 2 (two) times daily as needed for anxiety. 05/06/15  Yes Tiffany L Reed, DO  memantine (NAMENDA) 5 MG tablet Take 5 mg by mouth 2 (two) times daily.   Yes Historical Provider, MD  omega-3 acid ethyl esters (LOVAZA) 1 G capsule Take 1 g by mouth daily.   Yes Historical Provider, MD  pantoprazole (PROTONIX) 40 MG tablet Take 40 mg by mouth every morning.   Yes Historical Provider, MD  rivastigmine (EXELON) 9.5 mg/24hr Place 9.5 mg onto the skin daily.   Yes Historical Provider, MD  promethazine (PHENERGAN) 25 MG/ML injection Inject 25 mg into the vein every 6 (six) hours as needed for nausea or vomiting.    Historical Provider, MD     Vital Signs: BP 153/72 mmHg  Pulse 75  Temp(Src) 97.4 F (36.3 C) (Oral)  Resp 20  SpO2 98%  Physical Exam  Constitutional: He is oriented to person, place, and  time. He appears well-developed and well-nourished.  Cardiovascular: Normal rate and regular rhythm.   Pulmonary/Chest: Effort normal and breath sounds normal.  Abdominal: Soft. Bowel sounds are normal. He exhibits distension. There is tenderness.  Musculoskeletal: Normal range of motion. He exhibits edema.  Neurological: He is alert and oriented to person, place, and time.    Imaging: No results found.  Labs:  CBC:  Recent Labs  03/08/15 1013 06/08/15 0806 06/14/15 1417 06/30/15 0800  WBC 5.6 5.7 5.7 7.5  HGB 12.4* 11.5*  11.0* 11.3*  HCT 37.9* 36.0* 34.8* 35.1*  PLT 146 134* 143 150    COAGS:  Recent Labs  01/26/15 1514 02/16/15 1215 06/30/15 0800  INR 1.00* 1.05 1.09  APTT  --  33 32    BMP:  Recent Labs  02/16/15 1215 03/08/15 1013 06/08/15 0806 06/08/15 0900 06/14/15 1417  NA 138 135* 132*  --  130*  K 5.7* 5.5* 6.1* 5.8* 6.1*  CL 97*  --  97*  --   --   CO2 31 29 29   --  30*  GLUCOSE 213* 288* 179*  --  411*  BUN 41* 38.5* 28*  --  22.1  CALCIUM 8.9 9.2 8.8*  --  9.0  CREATININE 1.45* 1.5* 1.33*  --  1.6*  GFRNONAA 48*  --  54*  --   --   GFRAA 56*  --  >60  --   --     LIVER FUNCTION TESTS:  Recent Labs  01/26/15 1517 02/16/15 1215 03/08/15 1013 06/14/15 1417  BILITOT 0.28 0.7 0.56 0.42  AST 14 19 17 29   ALT 11 17 13 25   ALKPHOS 122 99 113 180*  PROT 6.6 7.1 6.7 6.4  ALBUMIN 3.3* 3.8 3.4* 3.3*    Assessment and Plan: Pt with hx cirrhosis and 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 Plan is for right hepatic lobe DEB-TACE today. Risks and benefits discussed with the patient including, but not limited to bleeding, infection, nontarget embolization, vascular injury or contrast induced renal failure.All of the patient's questions were answered, patient is agreeable to proceed.Consent signed and in chart.Following procedure pt will be admitted for overnight observation.     Signed: D. Rowe Robert 06/30/2015, 8:59 AM   I spent a total of 30 minutes at the the patient's bedside AND on the patient's hospital floor or unit, greater than 50% of which was counseling/coordinating care for right hepatic lobe DEB-TACE

## 2015-06-30 NOTE — Sedation Documentation (Signed)
Gauze/tegaderm bandage applied to L femoral artery puncture site.  Groin level 0, Drsg CDI, LDP +Doppler.

## 2015-06-30 NOTE — Procedures (Signed)
Successful RT HEPATIC ARTERY DEB TACE  NO COMP STABLE FULL REPORT IN PACS FULL DOSE 150MG  DOXORUBICIN GIVEN

## 2015-06-30 NOTE — Progress Notes (Signed)
Patient ID: Alex Foster, male   DOB: Jul 26, 1946, 69 y.o.   MRN: [id number]    Referring Physician(s): Feng,Y  Chief Complaint:  Hepatocellular carcinoma  Subjective:  Patient doing fairly well, has some mild epigastric discomfort, tolerated diet okay; denies nausea or vomiting; Foley catheter irritating patient  Allergies: Penicillins and Tomato  Medications: Prior to Admission medications   Medication Sig Start Date End Date Taking? Authorizing Provider  aspirin 81 MG tablet Take 81 mg by mouth daily. Enteric Coated.   Yes Historical Provider, MD  atorvastatin (LIPITOR) 10 MG tablet Take 10 mg by mouth daily.   Yes Historical Provider, MD  calcium-vitamin D (OSCAL WITH D) 500-200 MG-UNIT per tablet Take 1 tablet by mouth daily with breakfast.   Yes Historical Provider, MD  Cholecalciferol (VITAMIN D3) 5000 UNITS CAPS Take 1 capsule by mouth daily.   Yes Historical Provider, MD  citalopram (CELEXA) 10 MG tablet Take 10 mg by mouth daily.   Yes Historical Provider, MD  divalproex (DEPAKOTE ER) 250 MG 24 hr tablet Take 250-500 mg by mouth 2 (two) times daily. 250mg  in the morning, 500mg  in the evening   Yes Historical Provider, MD  ferrous sulfate 325 (65 FE) MG tablet Take 325 mg by mouth daily with breakfast.   Yes Historical Provider, MD  Fluticasone-Salmeterol (ADVAIR) 250-50 MCG/DOSE AEPB Inhale 1 puff into the lungs 2 (two) times daily.   Yes Historical Provider, MD  furosemide (LASIX) 20 MG tablet Take 20 mg by mouth daily. 08/19/13  Yes Gerlene Fee, NP  HYDROcodone-acetaminophen (NORCO/VICODIN) 5-325 MG per tablet Take one tablet by mouth every morning for pain. Do not exceed 4gm of Tylenol in 24 hours 06/10/15  Yes Lauree Chandler, NP  insulin glargine (LANTUS) 100 UNIT/ML injection Inject 42 Units into the skin at bedtime.   Yes Historical Provider, MD  insulin lispro (HUMALOG) 100 UNIT/ML injection Inject 3-6 Units into the skin 3 (three) times daily before meals. 6  units before breakfast, 6 units before lunch, 3 units before dinner   Yes Historical Provider, MD  levothyroxine (SYNTHROID, LEVOTHROID) 75 MCG tablet Take 75 mcg by mouth daily before breakfast.   Yes Historical Provider, MD  lisinopril (PRINIVIL,ZESTRIL) 5 MG tablet Take 5 mg by mouth daily.   Yes Historical Provider, MD  LORazepam (ATIVAN) 0.5 MG tablet Take 1 tablet (0.5 mg total) by mouth 2 (two) times daily as needed for anxiety. 05/06/15  Yes Tiffany L Reed, DO  memantine (NAMENDA) 5 MG tablet Take 5 mg by mouth 2 (two) times daily.   Yes Historical Provider, MD  omega-3 acid ethyl esters (LOVAZA) 1 G capsule Take 1 g by mouth daily.   Yes Historical Provider, MD  pantoprazole (PROTONIX) 40 MG tablet Take 40 mg by mouth every morning.   Yes Historical Provider, MD  rivastigmine (EXELON) 9.5 mg/24hr Place 9.5 mg onto the skin daily.   Yes Historical Provider, MD  promethazine (PHENERGAN) 25 MG/ML injection Inject 25 mg into the vein every 6 (six) hours as needed for nausea or vomiting.    Historical Provider, MD     Vital Signs: BP 158/82 mmHg  Pulse 72  Temp(Src) 98.2 F (36.8 C) (Oral)  Resp 20  SpO2 98%  Physical Exam patient awake alert, puncture site left common femoral artery clean, dry, soft, nontender, no hematoma. Intact distal pulses. Abdomen soft, positive bowel sounds, mild epigastric tenderness to palpation. Foley catheter with yellow urine  Imaging: No results found.  Labs:  CBC:  Recent Labs  03/08/15 1013 06/08/15 0806 06/14/15 1417 06/30/15 0800  WBC 5.6 5.7 5.7 7.5  HGB 12.4* 11.5* 11.0* 11.3*  HCT 37.9* 36.0* 34.8* 35.1*  PLT 146 134* 143 150    COAGS:  Recent Labs  01/26/15 1514 02/16/15 1215 06/30/15 0800  INR 1.00* 1.05 1.09  APTT  --  33 32    BMP:  Recent Labs  02/16/15 1215 03/08/15 1013 06/08/15 0806 06/08/15 0900 06/14/15 1417 06/30/15 0800  NA 138 135* 132*  --  130* 131*  K 5.7* 5.5* 6.1* 5.8* 6.1* 5.3*  CL 97*  --  97*   --   --  95*  CO2 31 29 29   --  30* 29  GLUCOSE 213* 288* 179*  --  411* 183*  BUN 41* 38.5* 28*  --  22.1 24*  CALCIUM 8.9 9.2 8.8*  --  9.0 8.6*  CREATININE 1.45* 1.5* 1.33*  --  1.6* 1.24  GFRNONAA 48*  --  54*  --   --  58*  GFRAA 56*  --  >60  --   --  >60    LIVER FUNCTION TESTS:  Recent Labs  02/16/15 1215 03/08/15 1013 06/14/15 1417 06/30/15 0800  BILITOT 0.7 0.56 0.42 0.8  AST 19 17 29  34  ALT 17 13 25 25   ALKPHOS 99 113 180* 173*  PROT 7.1 6.7 6.4 7.0  ALBUMIN 3.8 3.4* 3.3* 3.6    Assessment and Plan: Hepatocellular carcinoma, status post right hepatic lobe DEB-TACE; for overnight observation; DC Foley catheter; follow-up with Dr. Annamaria Boots in IR clinic in 1 month; check a.m. labs ; Cipro 500 mg po bid x 7 days upon d/c   Signed: D. Rowe Robert 06/30/2015, 5:05 PM   I spent a total of 15 minutes at the the patient's bedside AND on the patient's hospital floor or unit, greater than 50% of which was counseling/coordinating care for right hepatic lobe HCC DEB-TACE

## 2015-06-30 NOTE — Progress Notes (Signed)
Patient admitted at around 1230 from IR. Alert and orientedx3. Placed on bedrest.L groin dsg CDI,gauze/ tegaderm inplace,no bleeding noted. Left dorsalis pedis pulse +doppler. Will continue to moniotr.

## 2015-06-30 NOTE — Plan of Care (Signed)
Problem: Phase I Progression Outcomes Goal: Voiding-avoid urinary catheter unless indicated Outcome: Not Progressing Patient has a foley catheter.

## 2015-07-01 DIAGNOSIS — C220 Liver cell carcinoma: Secondary | ICD-10-CM | POA: Diagnosis not present

## 2015-07-01 LAB — CBC
HCT: 33.9 % — ABNORMAL LOW (ref 39.0–52.0)
Hemoglobin: 11 g/dL — ABNORMAL LOW (ref 13.0–17.0)
MCH: 26.4 pg (ref 26.0–34.0)
MCHC: 32.4 g/dL (ref 30.0–36.0)
MCV: 81.3 fL (ref 78.0–100.0)
PLATELETS: 140 10*3/uL — AB (ref 150–400)
RBC: 4.17 MIL/uL — ABNORMAL LOW (ref 4.22–5.81)
RDW: 14.5 % (ref 11.5–15.5)
WBC: 9.2 10*3/uL (ref 4.0–10.5)

## 2015-07-01 LAB — COMPREHENSIVE METABOLIC PANEL
ALT: 49 U/L (ref 17–63)
AST: 74 U/L — AB (ref 15–41)
Albumin: 3.6 g/dL (ref 3.5–5.0)
Alkaline Phosphatase: 165 U/L — ABNORMAL HIGH (ref 38–126)
Anion gap: 8 (ref 5–15)
BUN: 24 mg/dL — AB (ref 6–20)
CHLORIDE: 94 mmol/L — AB (ref 101–111)
CO2: 28 mmol/L (ref 22–32)
CREATININE: 1.3 mg/dL — AB (ref 0.61–1.24)
Calcium: 8.6 mg/dL — ABNORMAL LOW (ref 8.9–10.3)
GFR calc non Af Amer: 55 mL/min — ABNORMAL LOW (ref >60)
Glucose, Bld: 361 mg/dL — ABNORMAL HIGH (ref 65–99)
POTASSIUM: 5.6 mmol/L — AB (ref 3.5–5.1)
SODIUM: 130 mmol/L — AB (ref 135–145)
Total Bilirubin: 0.7 mg/dL (ref 0.3–1.2)
Total Protein: 6.5 g/dL (ref 6.5–8.1)

## 2015-07-01 LAB — GLUCOSE, CAPILLARY
GLUCOSE-CAPILLARY: 280 mg/dL — AB (ref 65–99)
GLUCOSE-CAPILLARY: 203 mg/dL — AB (ref 65–99)
Glucose-Capillary: 330 mg/dL — ABNORMAL HIGH (ref 65–99)

## 2015-07-01 NOTE — Progress Notes (Signed)
Referring Physician(s): Dr. Burr Medico  Chief Complaint: [facility] s/p successful right hepatic artery DEB-TACE 9/28  Subjective: Patient states this morning had some back pain and RUQ pain, now he denies any pain. He denies any groin site pain. He has had foley catheter removed and has been able to urinate on his own without difficulty. He does complain of nausea and phenergan has helped with that however he is still having episodes of emesis with oral intake. He denies any chest pain or shortness of breath.   Allergies: Penicillins and Tomato  Medications: Prior to Admission medications   Medication Sig Start Date End Date Taking? Authorizing Provider  aspirin 81 MG tablet Take 81 mg by mouth daily. Enteric Coated.   Yes Historical Provider, MD  atorvastatin (LIPITOR) 10 MG tablet Take 10 mg by mouth daily.   Yes Historical Provider, MD  calcium-vitamin D (OSCAL WITH D) 500-200 MG-UNIT per tablet Take 1 tablet by mouth daily with breakfast.   Yes Historical Provider, MD  Cholecalciferol (VITAMIN D3) 5000 UNITS CAPS Take 1 capsule by mouth daily.   Yes Historical Provider, MD  citalopram (CELEXA) 10 MG tablet Take 10 mg by mouth daily.   Yes Historical Provider, MD  divalproex (DEPAKOTE ER) 250 MG 24 hr tablet Take 250-500 mg by mouth 2 (two) times daily. 250mg  in the morning, 500mg  in the evening   Yes Historical Provider, MD  ferrous sulfate 325 (65 FE) MG tablet Take 325 mg by mouth daily with breakfast.   Yes Historical Provider, MD  Fluticasone-Salmeterol (ADVAIR) 250-50 MCG/DOSE AEPB Inhale 1 puff into the lungs 2 (two) times daily.   Yes Historical Provider, MD  furosemide (LASIX) 20 MG tablet Take 20 mg by mouth daily. 08/19/13  Yes Gerlene Fee, NP  HYDROcodone-acetaminophen (NORCO/VICODIN) 5-325 MG per tablet Take one tablet by mouth every morning for pain. Do not exceed 4gm of Tylenol in 24 hours 06/10/15  Yes Lauree Chandler, NP  insulin glargine (LANTUS) 100 UNIT/ML injection  Inject 42 Units into the skin at bedtime.   Yes Historical Provider, MD  insulin lispro (HUMALOG) 100 UNIT/ML injection Inject 3-6 Units into the skin 3 (three) times daily before meals. 6 units before breakfast, 6 units before lunch, 3 units before dinner   Yes Historical Provider, MD  levothyroxine (SYNTHROID, LEVOTHROID) 75 MCG tablet Take 75 mcg by mouth daily before breakfast.   Yes Historical Provider, MD  lisinopril (PRINIVIL,ZESTRIL) 5 MG tablet Take 5 mg by mouth daily.   Yes Historical Provider, MD  LORazepam (ATIVAN) 0.5 MG tablet Take 1 tablet (0.5 mg total) by mouth 2 (two) times daily as needed for anxiety. 05/06/15  Yes Armand Preast L Reed, DO  memantine (NAMENDA) 5 MG tablet Take 5 mg by mouth 2 (two) times daily.   Yes Historical Provider, MD  omega-3 acid ethyl esters (LOVAZA) 1 G capsule Take 1 g by mouth daily.   Yes Historical Provider, MD  pantoprazole (PROTONIX) 40 MG tablet Take 40 mg by mouth every morning.   Yes Historical Provider, MD  rivastigmine (EXELON) 9.5 mg/24hr Place 9.5 mg onto the skin daily.   Yes Historical Provider, MD  promethazine (PHENERGAN) 25 MG/ML injection Inject 25 mg into the vein every 6 (six) hours as needed for nausea or vomiting.    Historical Provider, MD    Vital Signs: BP 178/71 mmHg  Pulse 72  Temp(Src) 98.2 F (36.8 C) (Oral)  Resp 20  Ht 5\' 11"  (1.803 m)  Wt  267 lb 12.8 oz (121.473 kg)  BMI 37.37 kg/m2  SpO2 95%  Physical Exam General: A&Ox3, NAD Heart: RRR Lungs: CTA Abd: Soft, slightly distended, tenderness RUQ to deep palpation  Ext: LCFA dressing intact, soft, NT, no evidence of bleeding/hematoma, DP intact  Imaging: Ir Angiogram Visceral Selective  06/30/2015   CLINICAL DATA:  Progressive multifocal hepatocellular carcinoma  EXAM: ULTRASOUND GUIDANCE FOR VASCULAR ACCESS  CELIAC AND RIGHT HEPATIC ARTERY CATHETERIZATIONS AND ANGIOGRAMS  PERIPHERAL RIGHT HEPATIC ARTERY MICRO CATHETERIZATION, ANGIOGRAM, AND DRUG-ELUTING BEADS -  TRANS ARTERIAL EMBOLIZATION (DEB TACE)  Date:  9/28/20169/28/2016 12:01 pm  Radiologist:  M. Daryll Brod, MD  Guidance:  Ultrasound and fluoroscopic  FLUOROSCOPY TIME:  21.5 minutes, 1,474 mGy  MEDICATIONS AND MEDICAL HISTORY: 4 mg Versed, 100 mcg fentanyl, 4 mg Decadron, 4 mg Zofran, 400 mg Cipro and 500 mg Flagyl both administered within 1 hour of the procedure.  ANESTHESIA/SEDATION: 66 minutes  CONTRAST:  75 cc  COMPLICATIONS: None immediate  PROCEDURE: Informed consent was obtained from the patient following explanation of the procedure, risks, benefits and alternatives. The patient understands, agrees and consents for the procedure. All questions were addressed. A time out was performed.  Maximal barrier sterile technique utilized including caps, mask, sterile gowns, sterile gloves, large sterile drape, hand hygiene, and ChloraPrep.  Under sterile conditions and local anesthesia, left common femoral artery micropuncture access performed with ultrasound. Images obtained for documentation. Guidewire inserted followed by the 4 [place] dilator set. Five fr sheath inserted over a Bentson guidewire. C2 catheter initially utilized to select the celiac origin. Selective celiac angiogram performed.  CELIAC: Celiac is widely patent. Splenic and hepatic vasculature patent. Hepatic vasculature is enlarged related to the hypervascularity of the multifocal hepatocellular carcinoma. There was difficulty with micro catheter and guidewire access of the hepatic artery. Therefore the C2 catheter was exchanged for a sos Omni select catheter. This catheter was reformed in the aorta and used to re- select the celiac origin. Eventually, the Renegade high flow catheter and the fathom guidewire were advanced peripherally into the right hepatic artery. Selective right hepatic angiogram performed.  Right hepatic artery: Diffuse nodular tumor vascular blushing present throughout the periphery of the right lobe. No significant arterial  venous shunting demonstrated. Access location appears safe for delivery of the drug-eluting beads -trans arterial chemo embolization. From this location, the full dose of LC 100 -300 bead mixed with 150 mg doxorubicin were instilled into the peripheral right hepatic artery.  Post embolization angiogram confirms sluggish but preserved antegrade flow in the right hepatic vasculature.  Access removed. Hemostasis obtained with manual compression. No immediate complication. Patient tolerated the procedure well.  IMPRESSION: Successful right hepatic artery drug-eluting beads - trans arterial chemo embolization (DEB TACE).   Electronically Signed   By: Jerilynn Mages.  Shick M.D.   On: 06/30/2015 17:34   Ir Angiogram Selective Each Additional Vessel  06/30/2015   CLINICAL DATA:  Progressive multifocal hepatocellular carcinoma  EXAM: ULTRASOUND GUIDANCE FOR VASCULAR ACCESS  CELIAC AND RIGHT HEPATIC ARTERY CATHETERIZATIONS AND ANGIOGRAMS  PERIPHERAL RIGHT HEPATIC ARTERY MICRO CATHETERIZATION, ANGIOGRAM, AND DRUG-ELUTING BEADS - TRANS ARTERIAL EMBOLIZATION (DEB TACE)  Date:  9/28/20169/28/2016 12:01 pm  Radiologist:  M. Daryll Brod, MD  Guidance:  Ultrasound and fluoroscopic  FLUOROSCOPY TIME:  21.5 minutes, 1,474 mGy  MEDICATIONS AND MEDICAL HISTORY: 4 mg Versed, 100 mcg fentanyl, 4 mg Decadron, 4 mg Zofran, 400 mg Cipro and 500 mg Flagyl both administered within 1 hour of the procedure.  ANESTHESIA/SEDATION: 66 minutes  CONTRAST:  75 cc  COMPLICATIONS: None immediate  PROCEDURE: Informed consent was obtained from the patient following explanation of the procedure, risks, benefits and alternatives. The patient understands, agrees and consents for the procedure. All questions were addressed. A time out was performed.  Maximal barrier sterile technique utilized including caps, mask, sterile gowns, sterile gloves, large sterile drape, hand hygiene, and ChloraPrep.  Under sterile conditions and local anesthesia, left common femoral  artery micropuncture access performed with ultrasound. Images obtained for documentation. Guidewire inserted followed by the 4 [place] dilator set. Five fr sheath inserted over a Bentson guidewire. C2 catheter initially utilized to select the celiac origin. Selective celiac angiogram performed.  CELIAC: Celiac is widely patent. Splenic and hepatic vasculature patent. Hepatic vasculature is enlarged related to the hypervascularity of the multifocal hepatocellular carcinoma. There was difficulty with micro catheter and guidewire access of the hepatic artery. Therefore the C2 catheter was exchanged for a sos Omni select catheter. This catheter was reformed in the aorta and used to re- select the celiac origin. Eventually, the Renegade high flow catheter and the fathom guidewire were advanced peripherally into the right hepatic artery. Selective right hepatic angiogram performed.  Right hepatic artery: Diffuse nodular tumor vascular blushing present throughout the periphery of the right lobe. No significant arterial venous shunting demonstrated. Access location appears safe for delivery of the drug-eluting beads -trans arterial chemo embolization. From this location, the full dose of LC 100 -300 bead mixed with 150 mg doxorubicin were instilled into the peripheral right hepatic artery.  Post embolization angiogram confirms sluggish but preserved antegrade flow in the right hepatic vasculature.  Access removed. Hemostasis obtained with manual compression. No immediate complication. Patient tolerated the procedure well.  IMPRESSION: Successful right hepatic artery drug-eluting beads - trans arterial chemo embolization (DEB TACE).   Electronically Signed   By: Jerilynn Mages.  Shick M.D.   On: 06/30/2015 17:34   Ir US Guide Vasc Access Left  06/30/2015   CLINICAL DATA:  Progressive multifocal hepatocellular carcinoma  EXAM: ULTRASOUND GUIDANCE FOR VASCULAR ACCESS  CELIAC AND RIGHT HEPATIC ARTERY CATHETERIZATIONS AND ANGIOGRAMS   PERIPHERAL RIGHT HEPATIC ARTERY MICRO CATHETERIZATION, ANGIOGRAM, AND DRUG-ELUTING BEADS - TRANS ARTERIAL EMBOLIZATION (DEB TACE)  Date:  9/28/20169/28/2016 12:01 pm  Radiologist:  M. Daryll Brod, MD  Guidance:  Ultrasound and fluoroscopic  FLUOROSCOPY TIME:  21.5 minutes, 1,474 mGy  MEDICATIONS AND MEDICAL HISTORY: 4 mg Versed, 100 mcg fentanyl, 4 mg Decadron, 4 mg Zofran, 400 mg Cipro and 500 mg Flagyl both administered within 1 hour of the procedure.  ANESTHESIA/SEDATION: 66 minutes  CONTRAST:  75 cc  COMPLICATIONS: None immediate  PROCEDURE: Informed consent was obtained from the patient following explanation of the procedure, risks, benefits and alternatives. The patient understands, agrees and consents for the procedure. All questions were addressed. A time out was performed.  Maximal barrier sterile technique utilized including caps, mask, sterile gowns, sterile gloves, large sterile drape, hand hygiene, and ChloraPrep.  Under sterile conditions and local anesthesia, left common femoral artery micropuncture access performed with ultrasound. Images obtained for documentation. Guidewire inserted followed by the 4 [place] dilator set. Five fr sheath inserted over a Bentson guidewire. C2 catheter initially utilized to select the celiac origin. Selective celiac angiogram performed.  CELIAC: Celiac is widely patent. Splenic and hepatic vasculature patent. Hepatic vasculature is enlarged related to the hypervascularity of the multifocal hepatocellular carcinoma. There was difficulty with micro catheter and guidewire access of the hepatic artery. Therefore the C2 catheter was exchanged for  a sos Omni select catheter. This catheter was reformed in the aorta and used to re- select the celiac origin. Eventually, the Renegade high flow catheter and the fathom guidewire were advanced peripherally into the right hepatic artery. Selective right hepatic angiogram performed.  Right hepatic artery: Diffuse nodular tumor  vascular blushing present throughout the periphery of the right lobe. No significant arterial venous shunting demonstrated. Access location appears safe for delivery of the drug-eluting beads -trans arterial chemo embolization. From this location, the full dose of LC 100 -300 bead mixed with 150 mg doxorubicin were instilled into the peripheral right hepatic artery.  Post embolization angiogram confirms sluggish but preserved antegrade flow in the right hepatic vasculature.  Access removed. Hemostasis obtained with manual compression. No immediate complication. Patient tolerated the procedure well.  IMPRESSION: Successful right hepatic artery drug-eluting beads - trans arterial chemo embolization (DEB TACE).   Electronically Signed   By: Jerilynn Mages.  Shick M.D.   On: 06/30/2015 17:34   Ir Embo Tumor Organ Ischemia Infarct Inc Guide Roadmapping  06/30/2015   CLINICAL DATA:  Progressive multifocal hepatocellular carcinoma  EXAM: ULTRASOUND GUIDANCE FOR VASCULAR ACCESS  CELIAC AND RIGHT HEPATIC ARTERY CATHETERIZATIONS AND ANGIOGRAMS  PERIPHERAL RIGHT HEPATIC ARTERY MICRO CATHETERIZATION, ANGIOGRAM, AND DRUG-ELUTING BEADS - TRANS ARTERIAL EMBOLIZATION (DEB TACE)  Date:  9/28/20169/28/2016 12:01 pm  Radiologist:  M. Daryll Brod, MD  Guidance:  Ultrasound and fluoroscopic  FLUOROSCOPY TIME:  21.5 minutes, 1,474 mGy  MEDICATIONS AND MEDICAL HISTORY: 4 mg Versed, 100 mcg fentanyl, 4 mg Decadron, 4 mg Zofran, 400 mg Cipro and 500 mg Flagyl both administered within 1 hour of the procedure.  ANESTHESIA/SEDATION: 66 minutes  CONTRAST:  75 cc  COMPLICATIONS: None immediate  PROCEDURE: Informed consent was obtained from the patient following explanation of the procedure, risks, benefits and alternatives. The patient understands, agrees and consents for the procedure. All questions were addressed. A time out was performed.  Maximal barrier sterile technique utilized including caps, mask, sterile gowns, sterile gloves, large sterile  drape, hand hygiene, and ChloraPrep.  Under sterile conditions and local anesthesia, left common femoral artery micropuncture access performed with ultrasound. Images obtained for documentation. Guidewire inserted followed by the 4 [place] dilator set. Five fr sheath inserted over a Bentson guidewire. C2 catheter initially utilized to select the celiac origin. Selective celiac angiogram performed.  CELIAC: Celiac is widely patent. Splenic and hepatic vasculature patent. Hepatic vasculature is enlarged related to the hypervascularity of the multifocal hepatocellular carcinoma. There was difficulty with micro catheter and guidewire access of the hepatic artery. Therefore the C2 catheter was exchanged for a sos Omni select catheter. This catheter was reformed in the aorta and used to re- select the celiac origin. Eventually, the Renegade high flow catheter and the fathom guidewire were advanced peripherally into the right hepatic artery. Selective right hepatic angiogram performed.  Right hepatic artery: Diffuse nodular tumor vascular blushing present throughout the periphery of the right lobe. No significant arterial venous shunting demonstrated. Access location appears safe for delivery of the drug-eluting beads -trans arterial chemo embolization. From this location, the full dose of LC 100 -300 bead mixed with 150 mg doxorubicin were instilled into the peripheral right hepatic artery.  Post embolization angiogram confirms sluggish but preserved antegrade flow in the right hepatic vasculature.  Access removed. Hemostasis obtained with manual compression. No immediate complication. Patient tolerated the procedure well.  IMPRESSION: Successful right hepatic artery drug-eluting beads - trans arterial chemo embolization (DEB TACE).   Electronically Signed  By: Eugenie Filler M.D.   On: 06/30/2015 17:34    Labs:  CBC:  Recent Labs  06/08/15 0806 06/14/15 1417 06/30/15 0800 07/01/15 0415  WBC 5.7 5.7 7.5 9.2  HGB  11.5* 11.0* 11.3* 11.0*  HCT 36.0* 34.8* 35.1* 33.9*  PLT 134* 143 150 140*    COAGS:  Recent Labs  01/26/15 1514 02/16/15 1215 06/30/15 0800  INR 1.00* 1.05 1.09  APTT  --  33 32    BMP:  Recent Labs  02/16/15 1215  06/08/15 0806 06/08/15 0900 06/14/15 1417 06/30/15 0800 07/01/15 0415  NA 138  < > 132*  --  130* 131* 130*  K 5.7*  < > 6.1* 5.8* 6.1* 5.3* 5.6*  CL 97*  --  97*  --   --  95* 94*  CO2 31  < > 29  --  30* 29 28  GLUCOSE 213*  < > 179*  --  411* 183* 361*  BUN 41*  < > 28*  --  22.1 24* 24*  CALCIUM 8.9  < > 8.8*  --  9.0 8.6* 8.6*  CREATININE 1.45*  < > 1.33*  --  1.6* 1.24 1.30*  GFRNONAA 48*  --  54*  --   --  58* 55*  GFRAA 56*  --  >60  --   --  >60 >60  < > = values in this interval not displayed.  LIVER FUNCTION TESTS:  Recent Labs  03/08/15 1013 06/14/15 1417 06/30/15 0800 07/01/15 0415  BILITOT 0.56 0.42 0.8 0.7  AST 17 29 34 74*  ALT 13 25 25  49  ALKPHOS 113 180* 173* 165*  PROT 6.7 6.4 7.0 6.5  ALBUMIN 3.4* 3.3* 3.6 3.6    Assessment and Plan: Hepatocellular carcinoma S/p right hepatic lobe DEB-TACE 9/28 Ongoing nausea and vomiting, phenergan ordered and alleviating nausea, unable to tolerate PO meds or diet at this time Will continue IV hydration and IV nausea medication PRN, he denies any pain Discharge when stable and able to tolerate oral medications and diet Labs stable, afebrile today.   SignedHedy Jacob 07/01/2015, 12:43 PM

## 2015-07-02 ENCOUNTER — Other Ambulatory Visit: Payer: Self-pay | Admitting: Radiology

## 2015-07-02 DIAGNOSIS — C220 Liver cell carcinoma: Secondary | ICD-10-CM | POA: Diagnosis not present

## 2015-07-02 LAB — GLUCOSE, CAPILLARY
GLUCOSE-CAPILLARY: 201 mg/dL — AB (ref 65–99)
Glucose-Capillary: 234 mg/dL — ABNORMAL HIGH (ref 65–99)

## 2015-07-02 MED ORDER — ONDANSETRON HCL 4 MG PO TABS: 4.0000 mg | ORAL_TABLET | Freq: Three times a day (TID) | ORAL | Status: AC | PRN

## 2015-07-02 MED ORDER — OXYCODONE HCL 5 MG PO TABS: 5.0000 mg | ORAL_TABLET | ORAL | Status: DC | PRN

## 2015-07-02 MED ORDER — CIPROFLOXACIN HCL 500 MG PO TABS: 500.0000 mg | ORAL_TABLET | Freq: Two times a day (BID) | ORAL | Status: DC

## 2015-07-02 NOTE — Discharge Summary (Signed)
Patient ID: Alex Foster MRN: [id number] DOB/AGE: 1946-06-19 69 y.o.  Admit date: 06/30/2015 Discharge date: 07/02/2015  Admission Diagnoses: [facility]  Discharge Diagnoses:  Active Problems:   Hepatocellular carcinoma   Discharged Condition: good  Hospital Course:  Pt familiar to IR service from prior liver lesion biopsy on 02/16/15 and recent consultation for treatment options of [place] on 06/15/15. He was seen by Dr. Annamaria Boots and deemed an appropriate candidate for hepatic DEB-TACE. He presented for the above procedure. On 9/28, he underwent successful DEB-TACE of right hepatic HCC. NO complications. Admitted for observation, had some persistent N/V that last the first 24hrs and required a second night admission. This has resolved and pt is eating regular diet. He c/o mild pain. Post op labs are good. He is now determined stable for discharge All instructions, medications, and follow up discussed.  Consults: None   Treatments: Successful RT HEPATIC ARTERY DEB TACE   Discharge Exam: Blood pressure 139/73, pulse 76, temperature 98 F (36.7 C), temperature source Oral, resp. rate 20, height 5\' 11"  (1.803 m), weight 267 lb 12.8 oz (121.473 kg), SpO2 92 %. Lungs: CTA without w/r/r Heart: Regular Abdomen: soft, NT Ext:(L) groin access site, soft, NT    Disposition: [facility] Facility  Discharge Instructions    Call MD for:  difficulty breathing, headache or visual disturbances    Complete by:  As directed      Call MD for:  persistant nausea and vomiting    Complete by:  As directed      Call MD for:  redness, tenderness, or signs of infection (pain, swelling, redness, odor or green/yellow discharge around incision site)    Complete by:  As directed      Call MD for:  severe uncontrolled pain    Complete by:  As directed      Call MD for:  temperature >100.4    Complete by:  As directed      Diet Carb Modified    Complete by:  As directed      Increase activity slowly     Complete by:  As directed      Remove dressing in 24 hours    Complete by:  As directed             Medication List    STOP taking these medications        promethazine 25 MG/ML injection  Commonly known as:  PHENERGAN      TAKE these medications        aspirin 81 MG tablet  Take 81 mg by mouth daily. Enteric Coated.     atorvastatin 10 MG tablet  Commonly known as:  LIPITOR  Take 10 mg by mouth daily.     calcium-vitamin D 500-200 MG-UNIT tablet  Commonly known as:  OSCAL WITH D  Take 1 tablet by mouth daily with breakfast.     ciprofloxacin 500 MG tablet  Commonly known as:  CIPRO  Take 1 tablet (500 mg total) by mouth 2 (two) times daily.     citalopram 10 MG tablet  Commonly known as:  CELEXA  Take 10 mg by mouth daily.     divalproex 250 MG 24 hr tablet  Commonly known as:  DEPAKOTE ER  Take 250-500 mg by mouth 2 (two) times daily. 250mg  in the morning, 500mg  in the evening     ferrous sulfate 325 (65 FE) MG tablet  Take 325 mg by mouth daily with breakfast.  Fluticasone-Salmeterol 250-50 MCG/DOSE Aepb  Commonly known as:  ADVAIR  Inhale 1 puff into the lungs 2 (two) times daily.     furosemide 20 MG tablet  Commonly known as:  LASIX  Take 20 mg by mouth daily.     HYDROcodone-acetaminophen 5-325 MG tablet  Commonly known as:  NORCO/VICODIN  Take one tablet by mouth every morning for pain. Do not exceed 4gm of Tylenol in 24 hours     insulin glargine 100 UNIT/ML injection  Commonly known as:  LANTUS  Inject 42 Units into the skin at bedtime.     insulin lispro 100 UNIT/ML injection  Commonly known as:  HUMALOG  Inject 3-6 Units into the skin 3 (three) times daily before meals. 6 units before breakfast, 6 units before lunch, 3 units before dinner     levothyroxine 75 MCG tablet  Commonly known as:  SYNTHROID, LEVOTHROID  Take 75 mcg by mouth daily before breakfast.     lisinopril 5 MG tablet  Commonly known as:  PRINIVIL,ZESTRIL  Take 5 mg  by mouth daily.     LORazepam 0.5 MG tablet  Commonly known as:  ATIVAN  Take 1 tablet (0.5 mg total) by mouth 2 (two) times daily as needed for anxiety.     memantine 5 MG tablet  Commonly known as:  NAMENDA  Take 5 mg by mouth 2 (two) times daily.     omega-3 acid ethyl esters 1 G capsule  Commonly known as:  LOVAZA  Take 1 g by mouth daily.     ondansetron 4 MG tablet  Commonly known as:  ZOFRAN  Take 1 tablet (4 mg total) by mouth every 8 (eight) hours as needed for nausea or vomiting.     oxyCODONE 5 MG immediate release tablet  Commonly known as:  Oxy IR/ROXICODONE  Take 1-2 tablets (5-10 mg total) by mouth every 4 (four) hours as needed for moderate pain or severe pain.     pantoprazole 40 MG tablet  Commonly known as:  PROTONIX  Take 40 mg by mouth every morning.     rivastigmine 9.5 mg/24hr  Commonly known as:  EXELON  Place 9.5 mg onto the skin daily.     Vitamin D3 5000 UNITS Caps  Take 1 capsule by mouth daily.           Follow-up Information    Follow up with Greggory Keen, MD. Go in 1 month.   Specialty:  Interventional Radiology   Why:  Post procedure eval   Contact information:   [address] [place] [address] [phone]        Signed: Ascencion Dike 07/02/2015, 9:01 AM   I have spent Less Than 30 Minutes discharging Alex Foster.

## 2015-07-02 NOTE — Clinical Social Work Note (Signed)
Clinical Social Work Assessment  Patient Details  Name: Alex Foster MRN: [id number] Date of Birth: 1946/02/18  Date of referral:  07/02/15               Reason for consult:  Discharge Planning                Permission sought to share information with:    Permission granted to share information::  No  Name::        Agency::     Relationship::     Contact Information:     Housing/Transportation Living arrangements for the past 2 months:  [place] of Information:  Patient Patient Interpreter Needed:  None Criminal Activity/Legal Involvement Pertinent to Current Situation/Hospitalization:  No - Comment as needed Significant Relationships:  Siblings Lives with:  Facility Resident Do you feel safe going back to the place where you live?  Yes Need for family participation in patient care:  No (Coment)  Care giving concerns:  Pt admitted from [facility] where pt is a long term resident.   Social Worker assessment / plan:  CSW received notification that pt admitted from [place] and medically ready to return today.  CSW met with pt at bedside and confirmed plans to return to [place].  CSW spoke with [place] and facility confirmed that they can accept pt back today. [name] states that they can provide transport for pt as pt wheelchair is in pt room. [name] states that transport will arrive by noon.  CSW notified RN and pt at bedside. Discharge packet provided to RN to provide to facility transport upon arrival.  CSW faxed discharge summary via [facility].   No further social work needs identified at this time.  CSW signing off.   Employment status:  Retired Nurse, adult, Medicaid In [place] PT Recommendations:  Not assessed at this time [place] / Referral to community resources:  [place]  Patient/Family's Response to care:  Pt alert and oriented x 4. Pt very anxious to return to [facility]  today.  Patient/Family's Understanding of and Emotional Response to Diagnosis, Current Treatment, and Prognosis:  Unable to assess as pt very concentrated on learning when he could return to [facility]. Pt aware that he is medically stable to discharge.  Emotional Assessment Appearance:  Appears stated age Attitude/Demeanor/Rapport:  Other (pt appropriate) Affect (typically observed):  Anxious Orientation:  Oriented to Self, Oriented to Place, Oriented to Situation, Oriented to  Time Alcohol / Substance use:  Not Applicable Psych involvement (Current and /or in the community):  No (Comment)  Discharge Needs  Concerns to be addressed:  Discharge Planning Concerns Readmission within the last 30 days:  No Current discharge risk:  None Barriers to Discharge:  No Barriers Identified   [place], [place], LCSW 07/02/2015, 10:36 AM  [phone]

## 2015-07-02 NOTE — Progress Notes (Addendum)
Patient ID: Jeshawn Melucci, male   DOB: 09/28/1946, 69 y.o.   MRN: [id number]                PROGRESS NOTE  DATE:  06/29/2015         FACILITY: [name]                  LEVEL OF CARE:   SNF   Acute Visit             CHIEF COMPLAINT:  Abscess on his left leg.     HISTORY OF PRESENT ILLNESS:  I saw Mr. Colclough in follow-up yesterday, largely from a phone call from his oncologist in reviewing his hepatocellular carcinoma yesterday.    In passing, he told me that he had a painful area on the posterior aspect of his left leg.  I am looking at that today.    PHYSICAL EXAMINATION:   SKIN:   INSPECTION:   He has a sizeable abscess on the posterior aspect of his left thigh.  This was cleaned, anesthetized, and lanced with a #15 scalpel.   Copious amounts of serosanguineous drainage were obtained.  Some of this will be sent for culture.    ASSESSMENT/PLAN:               Skin abscess.  This does not have the appearance of a sebaceous cyst.  I think he has had this problem in the past.  I will put him on Septra DS for seven days, topical antibiotics, and this really should take care of the issue currently.     CPT CODE: 35456

## 2015-07-02 NOTE — Discharge Instructions (Signed)
Chemoembolization, Care After °Refer to this sheet in the next few weeks. These instructions provide you with information on caring for yourself after your procedure. Your health care provider may also give you more specific instructions. Your treatment has been planned according to current medical practices, but problems sometimes occur. Call your health care provider if you have any problems or questions after your procedure. °WHAT TO EXPECT AFTER THE PROCEDURE  °After your procedure, it is typical to have the following: °· You might have a slight fever for 1-2 weeks after the procedure. If it gets worse, let your health care provider know. °· You might feel tired and not hungry. This is normal. These feelings should go away in about 1 week. °HOME CARE INSTRUCTIONS °· Take any medicine your health care provider prescribed for pain, nausea, or fever. Follow the directions carefully. °· Ask your health care provider whether you can take over-the-counter medicines for pain or fever. Do not take aspirin or anti-inflammatory medicines such as ibuprofen or naproxen unless your health care provider says that you should. These medicines can increase the chances of bleeding. °· If you were given a small breathing device (incentive spirometer), be sure to use it. It helps keep your lungs clear while you are recovering. You will not need this after your activity level is back to normal. °· Do not get the puncture site wet for the first few days after surgery or until your health care provider says it is okay. °· You should be able to resume your normal routine in about 1 week. °· During the first month after your procedure, you will probably need to go back to your health care provider for some simple tests. Scans and blood tests will help determine whether the procedure worked. °SEEK MEDICAL CARE IF: °· Blood or fluid leaks from the wound, or the wound becomes red or swollen. °· You become nauseous or throw up for more than  2 days after surgery. °· Your pain or fever becomes worse than it was when you left the hospital. °· You cannot drink clear liquids such as water or diluted juice or tea 24 hours after your procedure. °· You develop a rash. °SEEK IMMEDIATE MEDICAL CARE IF: °· You have a fever that gets worse or does not go away after 1 week. °· You develop pain, swelling, or discoloration in your legs. °· Your legs become pale, cold, or blue. °· You develop shortness of breath, feel faint, or pass out. °· You have chest pain. °· You have weakness or difficulty moving your arms or legs. °· You have changes in your speech or vision. °Document Released: 05/17/2011 Document Revised: 07/09/2013 Document Reviewed: 05/26/2013 °ExitCare® Patient Information ©2015 [employer]. This information is not intended to replace advice given to you by your health care provider. Make sure you discuss any questions you have with your health care provider. ° °

## 2015-07-02 NOTE — Progress Notes (Signed)
Report called to [place], Therapist, sports at [facility].

## 2015-07-05 ENCOUNTER — Other Ambulatory Visit: Payer: Self-pay | Admitting: *Deleted

## 2015-07-08 ENCOUNTER — Other Ambulatory Visit: Payer: Self-pay | Admitting: Radiology

## 2015-07-08 DIAGNOSIS — C220 Liver cell carcinoma: Secondary | ICD-10-CM

## 2015-07-09 ENCOUNTER — Other Ambulatory Visit: Payer: Self-pay | Admitting: *Deleted

## 2015-07-09 MED ORDER — HYDROCODONE-ACETAMINOPHEN 5-325 MG PO TABS: ORAL_TABLET | ORAL | Status: DC

## 2015-07-09 NOTE — Telephone Encounter (Signed)
[facility] 

## 2015-07-12 ENCOUNTER — Ambulatory Visit: Payer: Medicare Other | Admitting: Hematology

## 2015-07-12 ENCOUNTER — Other Ambulatory Visit: Payer: Medicare Other

## 2015-07-13 ENCOUNTER — Non-Acute Institutional Stay (SKILLED_NURSING_FACILITY): Payer: Medicare Other | Admitting: Internal Medicine

## 2015-07-13 DIAGNOSIS — K7469 Other cirrhosis of liver: Secondary | ICD-10-CM

## 2015-07-13 DIAGNOSIS — C220 Liver cell carcinoma: Secondary | ICD-10-CM

## 2015-07-13 DIAGNOSIS — N183 Chronic kidney disease, stage 3 (moderate): Secondary | ICD-10-CM | POA: Diagnosis not present

## 2015-07-13 DIAGNOSIS — E1122 Type 2 diabetes mellitus with diabetic chronic kidney disease: Secondary | ICD-10-CM

## 2015-07-13 NOTE — Progress Notes (Signed)
Patient ID: Alex Foster, male   DOB: 09/24/1946, 69 y.o.   MRN: [id number]    07/13/2015 Alex Foster Chief complaint; review of medical issues/[facility] visit/review of [facility] records History; Mr. Alex Foster continues to follow-up follow with his oncologist Dr. Burr Medico. He has underlying hepatocellular carcinoma. He had a recent MRI and September 6 [see results below]. He has been for an ablative procedure at interventional radiology for the right hepatic lobe hepatocellular carcinoma. He was admitted overnight for observation but states he generally feels well now. We have made some adjustments to his insulin for poorly controlled type 2 diabetes is essentially nonambulatory at the moment pushes himself in a wheelchair. Although he is been labeled as having dementia I really see none of this. He is on Namenda and Exelon.    Past Medical History  Diagnosis Date  . Hyperlipidemia   . COPD (chronic obstructive pulmonary disease)     Active smoker, has oxygen for nightime use.  Cannot tolerate using mask.  . Low back pain     Goes to [facility]  . H/O alcohol abuse   . Myocardial infarction   . Hypertension   . Diabetes mellitus   . 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 Extensive facial fractures 05/31/2012  . MRSA (methicillin resistant staphylococcus aureus) pneumonia 06/02/2012  . Cardiac arrest 06/02/2012  . Mobitz (type) II atrioventricular block 06/04/2012  . DVT of axillary vein, acute right 08/03/2012    Dx 06/17/2012 by Duplex   . COPD 05/08/2007    Pt continues to smoke.  He does not desire smoking cessation.  Currently he smokes about 1 1/2 ppd.     . Acute respiratory failure with hypoxia 06/02/2012  . Tracheobronchitis, MRSA 06/02/2012  . Pleural effusion 06/09/2012  . Type II or unspecified type diabetes mellitus with unspecified complication, uncontrolled 07/21/2013  . Hepatic encephalopathy 04/12/2011  . Leukocytosis 06/01/2012  . Lactic acidosis 06/01/2012  . Dizziness 07/18/2011  .  Fall 11/04/2012  . Tracheostomy status 11/11/2012  . Thrombocytopenia 03/24/2011    Transient   . Multiple rib fractures 05/31/2012  . Multiple fractures of ribs of left side 11/04/2012  . OTHER DISORDERS OF BONE AND CARTILAGE OTHER 08/17/2010    Narcotics prescribed by outside clinic.     Marland Kitchen BENIGN POSITIONAL VERTIGO 08/10/2008    Pt has been complaining of dizziness or sensations that he will fall or run into a wall.  His complaints date back to 2010-2011.  His orthostatics were negative.  He refused MRI as part of work up for TIA concerns.  I have referred him to Vest[facility] clinic.      Marland Kitchen Dementia with behavioral disturbance 04/18/2013  . Dysphagia, unspecified(787.20) 07/21/2013  . RBBB 02/03/2010    Qualifier: Diagnosis of  By: Aundra Dubin, MD, [place]    . HYPERTENSION, BENIGN ESSENTIAL, CONTROLLED 05/08/2007    Qualifier: Diagnosis of  By: Laverle Hobby MD, JOSEPH    . Hypothyroidism   . Cancer     Hepatic  . Chronic kidney disease     CRF  . Stroke 2005  . Urgency of urination     Past Surgical History  Procedure Laterality Date  . Transthoracic echocardiogram  01/2010    EF 60%, mild LVH, mild RV dilation with normal systolic function  . Nm myoview ltd  01/2010    Lexixan myoview: EF 67%, no ischemia or infarction  . Abi  01/2006    0.93 Normal  . Anteriolisthesis  06/26/2006  . Electrocardiogram  01/02/2006    1st degree block  . Cataract extraction, bilateral      per patient  . Percutaneous tracheostomy  11/04/2012    Procedure: PERCUTANEOUS TRACHEOSTOMY;  Surgeon: Gwenyth Ober, MD;  Location: [facility];  Service: General;  Laterality: N/A;  . Radiology with anesthesia N/A 06/08/2015    Procedure: MRI OF ABDOMEN WITH AND WITHOUT;  Surgeon: Medication Radiologist, MD;  Location: [facility];  Service: Radiology;  Laterality: N/A;  DR. SHICK/MRI    Current medications Ferrous sulfate 325 daily Os-Cal 500+ D1 tablet daily Vitamin D3 5000 units daily Ecotrin 81 daily Exelon 9.5 daily Celexa 20 daily Lasix  40 daily Protonix 40 daily Depakote 250 every morning and 500 at night Prinivil 5 daily Lovaza 1 g daily Synthroid 75 g daily Norco 5/325 every morning Namenda 5 twice a day Advair 250 one puff every 12 Lipitor 5 daily   Review of systems Gen. states he does not feel unwell Respiratory no shortness of breath Cardiac no chest pain GI no abdominal pain nausea vomiting or diarrhea. Complains about the postprocedure vomiting he had but that seems to have resolved. GU no dysuria Extremities note that he has had the edema in his legs, a portable echocardiogram was ordered although I don't see those results. Skin no new abscess is noted I did an I&D on 1 late last month  Physical examination Gen. patient is not in any distress. Last weight was 277 pounds on 9/13  BP 133/63 respirations 18 temperature 99.1 pulse 76 Respiratory; clear entry bilaterally Cardiac heart sounds are normal no murmurs JVP is not elevated Abdomen; less distended. Small umbilical hernia no liver no spleen no shifting dullness is appreciated. No tenderness GU; bladder is not distended no CVA tenderness Extremities; mild edema bilaterally no evidence of a DVT. Mental status; I'm not certain I see the abnormalities that are currently been seen by other practitioners I don't see depression. Nor do I really see major cognitive abnormalities that would support Aricept and Namenda  Impression/plan #1 hepatocellular carcinoma. He is going for a  ablation. I'm sure his prognosis is probably measured in the 6-12 month time frame he has underlying cirrhosis as well as. #2 type 2 diabetes; this is very poorly controlled his insulin has been adjusted. #3 episodic hyperkalemia. This could be hemolyzed specimen a [facility] or he could have type IV renal tubular acidosis #4 lower extremity edema. I see no evidence of heart failure at the bedside I note an echocardiogram has been done. #5 I'm not certain I see the need for a lot of his  current psychiatric and cognitive medications. I'll talk to the [facility] people about this. #6 skin abscess that I did an I&D on in late September had no growth the. Presumably this was a sebaceous cyst

## 2015-07-27 ENCOUNTER — Ambulatory Visit
Admission: RE | Admit: 2015-07-27 | Discharge: 2015-07-27 | Disposition: A | Discharge status: Home / Self Care | Payer: Medicare Other | Source: Ambulatory Visit | Attending: Radiology | Admitting: Radiology

## 2015-07-27 DIAGNOSIS — C220 Liver cell carcinoma: Secondary | ICD-10-CM
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
## 2015-07-30 ENCOUNTER — Other Ambulatory Visit (HOSPITAL_COMMUNITY): Payer: Self-pay | Admitting: Interventional Radiology

## 2015-07-30 ENCOUNTER — Other Ambulatory Visit: Payer: Self-pay | Admitting: Radiology

## 2015-07-30 DIAGNOSIS — C220 Liver cell carcinoma: Secondary | ICD-10-CM

## 2015-08-09 ENCOUNTER — Other Ambulatory Visit: Payer: Self-pay | Admitting: *Deleted

## 2015-08-09 MED ORDER — HYDROCODONE-ACETAMINOPHEN 5-325 MG PO TABS: ORAL_TABLET | ORAL | Status: DC

## 2015-08-09 NOTE — Telephone Encounter (Signed)
[facility] 

## 2015-08-16 ENCOUNTER — Other Ambulatory Visit: Payer: Self-pay | Admitting: *Deleted

## 2015-08-16 MED ORDER — HYDROCODONE-ACETAMINOPHEN 5-325 MG PO TABS: ORAL_TABLET | ORAL | Status: AC

## 2015-08-16 NOTE — Telephone Encounter (Signed)
[facility] 

## 2015-08-23 ENCOUNTER — Encounter (HOSPITAL_COMMUNITY): Payer: Self-pay | Admitting: *Deleted

## 2015-08-23 NOTE — Progress Notes (Signed)
Pt SDW-pre-op call completed by Marita Kansas, LPN. Nurse denies pt c/o SOB and chest pain. According to pt nurse,  there is no documentation stating that the patient is under the care of a cardiologist. Nurse to fax [facility]. Please measure pt neck DOS to complete Apnea screening. Pt chart forwarded to Levada Dy, NP, anesthesia to review pt history.

## 2015-08-24 ENCOUNTER — Encounter (HOSPITAL_COMMUNITY)
Admission: RE | Disposition: A | Discharge status: Home / Self Care | Payer: Self-pay | Source: Ambulatory Visit | Attending: Interventional Radiology

## 2015-08-24 ENCOUNTER — Ambulatory Visit (HOSPITAL_COMMUNITY): Payer: Medicare Other | Admitting: Critical Care Medicine

## 2015-08-24 ENCOUNTER — Ambulatory Visit (HOSPITAL_COMMUNITY)
Admission: RE | Admit: 2015-08-24 | Discharge: 2015-08-24 | Disposition: A | Discharge status: Home / Self Care | Payer: Medicare Other | Source: Ambulatory Visit | Attending: Interventional Radiology | Admitting: Interventional Radiology

## 2015-08-24 ENCOUNTER — Non-Acute Institutional Stay (SKILLED_NURSING_FACILITY): Payer: Medicare Other | Admitting: Internal Medicine

## 2015-08-24 ENCOUNTER — Encounter (HOSPITAL_COMMUNITY): Payer: Self-pay | Admitting: Critical Care Medicine

## 2015-08-24 DIAGNOSIS — K838 Other specified diseases of biliary tract: Secondary | ICD-10-CM | POA: Diagnosis not present

## 2015-08-24 DIAGNOSIS — C220 Liver cell carcinoma: Secondary | ICD-10-CM

## 2015-08-24 DIAGNOSIS — I714 Abdominal aortic aneurysm, without rupture: Secondary | ICD-10-CM | POA: Diagnosis not present

## 2015-08-24 DIAGNOSIS — K7460 Unspecified cirrhosis of liver: Secondary | ICD-10-CM | POA: Diagnosis present

## 2015-08-24 DIAGNOSIS — K828 Other specified diseases of gallbladder: Secondary | ICD-10-CM | POA: Insufficient documentation

## 2015-08-24 DIAGNOSIS — K831 Obstruction of bile duct: Secondary | ICD-10-CM

## 2015-08-24 DIAGNOSIS — R188 Other ascites: Secondary | ICD-10-CM | POA: Diagnosis not present

## 2015-08-24 HISTORY — DX: History of falling: Z91.81

## 2015-08-24 HISTORY — DX: Weakness: R53.1

## 2015-08-24 HISTORY — PX: RADIOLOGY WITH ANESTHESIA: SHX6223

## 2015-08-24 LAB — COMPREHENSIVE METABOLIC PANEL
ALBUMIN: 2 g/dL — AB (ref 3.5–5.0)
ALK PHOS: 1585 U/L — AB (ref 38–126)
ALT: 116 U/L — ABNORMAL HIGH (ref 17–63)
AST: 253 U/L — AB (ref 15–41)
Anion gap: 8 (ref 5–15)
BILIRUBIN TOTAL: 10.6 mg/dL — AB (ref 0.3–1.2)
BUN: 15 mg/dL (ref 6–20)
CALCIUM: 8.4 mg/dL — AB (ref 8.9–10.3)
CO2: 26 mmol/L (ref 22–32)
CREATININE: 1.36 mg/dL — AB (ref 0.61–1.24)
Chloride: 97 mmol/L — ABNORMAL LOW (ref 101–111)
GFR calc Af Amer: >60 mL/min (ref >60)
GFR, EST NON AFRICAN AMERICAN: 52 mL/min — AB (ref >60)
GLUCOSE: 235 mg/dL — AB (ref 65–99)
POTASSIUM: 5.5 mmol/L — AB (ref 3.5–5.1)
Sodium: 131 mmol/L — ABNORMAL LOW (ref 135–145)
TOTAL PROTEIN: 5.7 g/dL — AB (ref 6.5–8.1)

## 2015-08-24 LAB — CBC
HEMATOCRIT: 29.7 % — AB (ref 39.0–52.0)
Hemoglobin: 9.2 g/dL — ABNORMAL LOW (ref 13.0–17.0)
MCH: 25.9 pg — ABNORMAL LOW (ref 26.0–34.0)
MCHC: 31 g/dL (ref 30.0–36.0)
MCV: 83.7 fL (ref 78.0–100.0)
PLATELETS: 192 10*3/uL (ref 150–400)
RBC: 3.55 MIL/uL — ABNORMAL LOW (ref 4.22–5.81)
RDW: 18.5 % — AB (ref 11.5–15.5)
WBC: 6.4 10*3/uL (ref 4.0–10.5)

## 2015-08-24 LAB — GLUCOSE, CAPILLARY
GLUCOSE-CAPILLARY: 222 mg/dL — AB (ref 65–99)
GLUCOSE-CAPILLARY: 221 mg/dL — AB (ref 65–99)
GLUCOSE-CAPILLARY: 217 mg/dL — AB (ref 65–99)

## 2015-08-24 SURGERY — RADIOLOGY WITH ANESTHESIA
Anesthesia: General

## 2015-08-24 MED ORDER — LACTATED RINGERS IV SOLN
INTRAVENOUS | Status: DC
  Administered 2015-08-24: 08:00:00 via INTRAVENOUS

## 2015-08-24 MED ORDER — GADOBENATE DIMEGLUMINE 529 MG/ML IV SOLN
20.0000 mL | Freq: Once | INTRAVENOUS | Status: AC | PRN
  Administered 2015-08-24: 20 mL via INTRAVENOUS

## 2015-08-24 MED FILL — Propofol IV Emul 200 MG/20ML (10 MG/ML): INTRAVENOUS | Qty: 20 | Status: AC

## 2015-08-24 MED FILL — Lidocaine HCl IV Inj 20 MG/ML: INTRAVENOUS | Qty: 5 | Status: AC

## 2015-08-24 MED FILL — Lactated Ringer's Solution: INTRAVENOUS | Qty: 1000 | Status: AC

## 2015-08-24 MED FILL — Phenylephrine-NaCl Pref Syr 0.4 MG/10ML-0.9% (40 MCG/ML): INTRAVENOUS | Qty: 10 | Status: AC

## 2015-08-24 MED FILL — Phenylephrine HCl Inj 10 MG/ML: INTRAMUSCULAR | Qty: 1 | Status: AC

## 2015-08-24 MED FILL — Succinylcholine Chloride Sol Pref Syr 200 MG/10ML (20 MG/ML): INTRAVENOUS | Qty: 6 | Status: AC

## 2015-08-24 MED FILL — Ephedrine Sulf-NaCl Soln Pref Syr 50 MG/10ML-0.9% (5 MG/ML): INTRAVENOUS | Qty: 10 | Status: AC

## 2015-08-24 NOTE — Progress Notes (Signed)
Patient having trouble moving left leg. States weak. Upon getting into the wheelchair with transport patient able to lift leg up off the ground.

## 2015-08-24 NOTE — Progress Notes (Addendum)
Patient dressed by aid Jocelyn. Patient tolerated well. Walked out to Web designer via aid and NT. Report called over to Carmelina Dane. General anesthesia information form sent with patient. No complaints at this time. Dr. Oletta Lamas notified for sign out states patient can go.

## 2015-08-24 NOTE — Transfer of Care (Signed)
Immediate Anesthesia Transfer of Care Note  Patient: Alex Foster  Procedure(s) Performed: Procedure(s): MRI OF ABDOMINEN WITH AND WITHOUT (N/A)  Patient Location: PACU  Anesthesia Type:General  Level of Consciousness: awake  Airway & Oxygen Therapy: Patient Spontanous Breathing and Patient connected to nasal cannula oxygen  Post-op Assessment: Report given to RN, Post -op Vital signs reviewed and stable and Patient moving all extremities X 4  Post vital signs: Reviewed and stable  Last Vitals:  Filed Vitals:   08/24/15 0735  BP: 163/59  Pulse: 63  Temp: 36.4 C  Resp: 16    Complications: No apparent anesthesia complications

## 2015-08-24 NOTE — Anesthesia Preprocedure Evaluation (Addendum)
Anesthesia Evaluation  Patient identified by MRN, date of birth, ID band Patient awake    Reviewed: Allergy & Precautions, NPO status , Patient's Chart, lab work & pertinent test results  Airway Mallampati: I  TM Distance: >3 FB     Dental  (+) Dental Advisory Given, Edentulous Upper, Edentulous Lower   Pulmonary COPD, former smoker,    breath sounds clear to auscultation       Cardiovascular hypertension, Pt. on medications + Past MI and + Peripheral Vascular Disease   Rhythm:Regular Rate:Normal     Neuro/Psych Anxiety CVA    GI/Hepatic   Endo/Other  diabetesHypothyroidism   Renal/GU      Musculoskeletal   Abdominal   Peds  Hematology   Anesthesia Other Findings   Reproductive/Obstetrics                           Anesthesia Physical Anesthesia Plan  ASA: III  Anesthesia Plan: General   Post-op Pain Management:    Induction: Intravenous  Airway Management Planned: Oral ETT  Additional Equipment:   Intra-op Plan:   Post-operative Plan: Extubation in OR  Informed Consent: I have reviewed the patients History and Physical, chart, labs and discussed the procedure including the risks, benefits and alternatives for the proposed anesthesia with the patient or authorized representative who has indicated his/her understanding and acceptance.   Dental advisory given  Plan Discussed with: Anesthesiologist and Surgeon  Anesthesia Plan Comments:         Anesthesia Quick Evaluation

## 2015-08-30 ENCOUNTER — Encounter (HOSPITAL_COMMUNITY): Payer: Self-pay | Admitting: Radiology

## 2015-08-31 ENCOUNTER — Other Ambulatory Visit: Payer: Self-pay | Admitting: Internal Medicine

## 2015-08-31 ENCOUNTER — Inpatient Hospital Stay: Admission: RE | Admit: 2015-08-31 | Payer: Medicare Other | Source: Ambulatory Visit

## 2015-08-31 ENCOUNTER — Non-Acute Institutional Stay (SKILLED_NURSING_FACILITY): Payer: Medicare Other | Admitting: Internal Medicine

## 2015-08-31 DIAGNOSIS — C220 Liver cell carcinoma: Secondary | ICD-10-CM

## 2015-08-31 DIAGNOSIS — E1122 Type 2 diabetes mellitus with diabetic chronic kidney disease: Secondary | ICD-10-CM

## 2015-08-31 DIAGNOSIS — J441 Chronic obstructive pulmonary disease with (acute) exacerbation: Secondary | ICD-10-CM

## 2015-08-31 DIAGNOSIS — N183 Chronic kidney disease, stage 3 unspecified: Secondary | ICD-10-CM

## 2015-08-31 DIAGNOSIS — Z794 Long term (current) use of insulin: Secondary | ICD-10-CM

## 2015-08-31 DIAGNOSIS — K838 Other specified diseases of biliary tract: Secondary | ICD-10-CM

## 2015-08-31 DIAGNOSIS — K831 Obstruction of bile duct: Secondary | ICD-10-CM

## 2015-08-31 DIAGNOSIS — K7469 Other cirrhosis of liver: Secondary | ICD-10-CM | POA: Diagnosis not present

## 2015-08-31 NOTE — Progress Notes (Signed)
Patient ID: Alex Foster, male   DOB: 1946/05/04, 69 y.o.   MRN: [id number]   Facility; [name] Chief complaint; follow-up jaundice History; I saw this man last week care secondary to increasing jaundice. His total bilirubin was up to 9.9 with an alkaline phosphatase of 1928. In spite of the fact that he has underlying cirrhosis and extensive hepatocellular carcinoma his regularly scheduled MRI posthepatic artery ablation not only showed more extensive tumor in the liver but evidence of intrahepatic and extrahepatic bile duct dilation up to 19 mm. There was no obvious pancreatic mass. Distally as the bile duct approach to the ampulla it appeared to become normal caliber suggesting that he had probably attempted to pass a gallstone. There is no evidence of acute cholecystitis. Given the fact that the patient was completely asymptomatic last week without evidence of abdominal pain, fever, nausea, vomiting etc. I elected to follow his lab work this week. If this were improving this might suggest he had passed a gallstone and we could simply follow this clinically. Unfortunately as of the time of this dictation I don't have the actual lab work.  The patient states he nonspecifically does not feel well. He however does not specifically complain of abdominal pain, nausea, vomiting. He has not been running a fever. His most recent weight is 266 pounds which is not a startling decrease from baseline which seems to be around 270 pounds.  With regards to his diabetes his blood sugars are running low in the morning generally in the high 60s to low 70s. He is on Lantus insulin 50 units at night and short acting insulin 12 units before meals meals. I think the Lantus might need to be increased. This would probably reflect declining oral intake more than anything else.  Past Medical History  Diagnosis Date  . Hyperlipidemia   . COPD (chronic obstructive pulmonary disease) ([place])     Active smoker, has oxygen  for nightime use.  Cannot tolerate using mask.  . Low back pain     Goes to [facility]  . H/O alcohol abuse   . Myocardial infarction ([place])   . Hypertension   . Diabetes mellitus   . TBI (traumatic brain injury) ([place]) 05/31/2012  . Extensive facial fractures ([place]) 05/31/2012  . MRSA (methicillin resistant staphylococcus aureus) pneumonia 06/02/2012  . Cardiac arrest ([place]) 06/02/2012  . Mobitz (type) II atrioventricular block 06/04/2012  . DVT of axillary vein, acute right 08/03/2012    Dx 06/17/2012 by Duplex   . COPD 05/08/2007    Pt continues to smoke.  He does not desire smoking cessation.  Currently he smokes about 1 1/2 ppd.     . Acute respiratory failure with hypoxia ([place]) 06/02/2012  . Tracheobronchitis, MRSA 06/02/2012  . Pleural effusion 06/09/2012  . Type II or unspecified type diabetes mellitus with unspecified complication, uncontrolled 07/21/2013  . Hepatic encephalopathy ([place]) 04/12/2011  . Leukocytosis 06/01/2012  . Lactic acidosis 06/01/2012  . Dizziness 07/18/2011  . Fall 11/04/2012  . Tracheostomy status ([place]) 11/11/2012  . Thrombocytopenia ([place]) 03/24/2011    Transient   . Multiple rib fractures 05/31/2012  . Multiple fractures of ribs of left side 11/04/2012  . OTHER DISORDERS OF BONE AND CARTILAGE OTHER 08/17/2010    Narcotics prescribed by outside clinic.     Marland Kitchen BENIGN POSITIONAL VERTIGO 08/10/2008    Pt has been complaining of dizziness or sensations that he will fall or run into a wall.  His complaints date back to  2010-2011.  His orthostatics were negative.  He refused MRI as part of work up for TIA concerns.  I have referred him to Vestibular clinic.      Marland Kitchen Dementia with behavioral disturbance 04/18/2013  . Dysphagia, unspecified(787.20) 07/21/2013  . RBBB 02/03/2010    Qualifier: Diagnosis of  By: Aundra Dubin, MD, [place]    . HYPERTENSION, BENIGN ESSENTIAL, CONTROLLED 05/08/2007    Qualifier: Diagnosis of  By: Laverle Hobby MD, JOSEPH    . Hypothyroidism   . Cancer [facility])     Hepatic  .  Chronic kidney disease     CRF  . Stroke ([place]) 2005  . Urgency of urination   . Generalized weakness   . Risk for falls     Past Surgical History  Procedure Laterality Date  . Transthoracic echocardiogram  01/2010    EF 60%, mild LVH, mild RV dilation with normal systolic function  . Nm myoview ltd  01/2010    Lexixan myoview: EF 67%, no ischemia or infarction  . Abi  01/2006    0.93 Normal  . Anteriolisthesis  06/26/2006  . Electrocardiogram  01/02/2006    1st degree block  . Cataract extraction, bilateral      per patient  . Percutaneous tracheostomy  11/04/2012    Procedure: PERCUTANEOUS TRACHEOSTOMY;  Surgeon: Gwenyth Ober, MD;  Location: [place];  Service: General;  Laterality: N/A;  . Radiology with anesthesia N/A 06/08/2015    Procedure: MRI OF ABDOMEN WITH AND WITHOUT;  Surgeon: Medication Radiologist, MD;  Location: [place];  Service: Radiology;  Laterality: N/A;  DR. SHICK/MRI  . Radiology with anesthesia N/A 08/24/2015    Procedure: MRI OF ABDOMINEN WITH AND WITHOUT;  Surgeon: Medication Radiologist, MD;  Location: [place];  Service: Radiology;  Laterality: N/A;    Current medications ASA 81 daily Lipitor 10 mg daily [ this was put on hold last week secondary to increased LFTs] although I'm doubtful this at anything to do with it the. Os-Cal 500+ D5 100/200 one tablet daily Depakote 250 he R every morning and 500 every night Ferrous sulfate 325 daily Synthroid 75 daily Lisinopril 5 mg daily Protonix 40 daily Vitamin D 5000 units daily [place] 5/325 every morning Lantus insulin 50 units every night Insulin lispro 12 units before meals meals Celexa 20 mg daily Advair 250/50 one twice a day  Review of systems Gen. no fever or chills HEENT normal oral discomfort Respiratory no shortness of breath or cough Cardiac no chest pain GI no abdominal pain nausea or vomiting GU no dysuria or hematuria. Extremities complains of edema  Physical examination Gen. the patient is not in  any distress however looks to be more jaundiced this week Vitals blood pressure 150/82 respirations 18 temperature 97.8 pulse 68 O2 sat 95% Respiratory clear entry bilaterally Cardiac heart sounds are normal he appears to be euvolemic Abdomen mildly distended liver is not palpable there is no spleen palpable. He has a peri-umbilicus hernia which reduces easily GU no suprapubic or costovertebral angle tenderness Extremities; minimal edema is noted Neurologic; upper and lower extremity strength seems at baseline. I can detect no asterixis Mental status; no overt changes are seen.  Impression/plan #1 jaundice this appears to be due to some form of distal common bile duct obstruction. UnFortunately the MRI did not really visualize the area well other than to say he did not have a pancreatic mass. No clear distal common bile duct stone was seen although they could not exactly exclude 1. The  suggestion was to proceed with an ERCP however I was hoping to see this improvement if the patient was indeed passing a small stone not seen on the MRI. He appears to be more deeply jaundiced at least visually than last week. Unfortunately the lab work that I ordered for yesterday was not done I'll try to repeat this stat today and then will need to go forward with making some decisions about how to proceed here. #2 type 2 diabetes; his fasting blood sugars appear to be lower than I would like and I'm going to reduce the Lantus insulin. #3 extensive hepatocellular carcinoma which by the MRI appears to have progressed the. He also has baseline cirrhosis of the liver nevertheless does not appear to be in liver failure at least at the bedside. There was also felt on the latest MRI that he had a right lower lobe perihilar metastatic lesion and a CT scan of the lung was suggested that I ordered last week. #4 I will try to have a discussion with the patient later today about a more hospice mediated approach to his care.  Clearly the hepatic artery intervention did not really result in any tangible benefit here. His prognosis is limited based on the underlying tumor and cirrhosis in his liver. It is likely that he would not be a good candidate for an ERCP. I have discussed this also with the [facility] nurse practitioner. If he wants to proceed with a GI consultation I will try to assist on arranging this although I wonder whether he's even a candidate

## 2015-09-01 NOTE — Progress Notes (Addendum)
Patient ID: Alex Foster, male   DOB: 1946-01-09, 69 y.o.   MRN: [id number]                PROGRESS NOTE  DATE:  08/24/2015           FACILITY: [name]                   LEVEL OF CARE:   SNF   Acute Visit                CHIEF COMPLAINT:  Jaundice.      HISTORY OF PRESENT ILLNESS:  I was asked to look at Mr. Alex Foster today, who is a gentleman with known underlying hepatocellular carcinoma which is multifocal in his liver.  He underwent a hepatic artery catheterization on 06/30/2015 and he had a successful DEB PACE on that date.  His previous bilirubin was 0.4, his creatinine at 1.3 at that time.  He did well.    I think he went over this morning for a regularly scheduled MRI to follow up on the procedure.    Apparently over the last 4-5 days, staff have noted increasing icterus and jaundice of his skin.  The patient does not complain of nausea, vomiting, abdominal pain or distention.  He says he is somewhat fatigued, but has not been running a fever.    PAST MEDICAL HISTORY/PROBLEM LIST:   Past Medical History  Diagnosis Date  . Hyperlipidemia   . COPD (chronic obstructive pulmonary disease) ([place])     Active smoker, has oxygen for nightime use.  Cannot tolerate using mask.  . Low back pain     Goes to [facility]  . H/O alcohol abuse   . Myocardial infarction ([place])   . Hypertension   . Diabetes mellitus   . TBI (traumatic brain injury) ([place]) 05/31/2012  . Extensive facial fractures ([place]) 05/31/2012  . MRSA (methicillin resistant staphylococcus aureus) pneumonia 06/02/2012  . Cardiac arrest ([place]) 06/02/2012  . Mobitz (type) II atrioventricular block 06/04/2012  . DVT of axillary vein, acute right 08/03/2012    Dx 06/17/2012 by Duplex   . COPD 05/08/2007    Pt continues to smoke.  He does not desire smoking cessation.  Currently he smokes about 1 1/2 ppd.     . Acute respiratory failure with hypoxia ([place]) 06/02/2012  . Tracheobronchitis, MRSA 06/02/2012  . Pleural effusion  06/09/2012  . Type II or unspecified type diabetes mellitus with unspecified complication, uncontrolled 07/21/2013  . Hepatic encephalopathy ([place]) 04/12/2011  . Leukocytosis 06/01/2012  . Lactic acidosis 06/01/2012  . Dizziness 07/18/2011  . Fall 11/04/2012  . Tracheostomy status ([place]) 11/11/2012  . Thrombocytopenia ([place]) 03/24/2011    Transient   . Multiple rib fractures 05/31/2012  . Multiple fractures of ribs of left side 11/04/2012  . OTHER DISORDERS OF BONE AND CARTILAGE OTHER 08/17/2010    Narcotics prescribed by outside clinic.     Marland Kitchen BENIGN POSITIONAL VERTIGO 08/10/2008    Pt has been complaining of dizziness or sensations that he will fall or run into a wall.  His complaints date back to 2010-2011.  His orthostatics were negative.  He refused MRI as part of work up for TIA concerns.  I have referred him to Vestibular clinic.      Marland Kitchen Dementia with behavioral disturbance 04/18/2013  . Dysphagia, unspecified(787.20) 07/21/2013  . RBBB 02/03/2010    Qualifier: Diagnosis of  By: Aundra Dubin, MD, [place]    . HYPERTENSION, BENIGN ESSENTIAL,  CONTROLLED 05/08/2007    Qualifier: Diagnosis of  By: Mannie Stabile MD, JOSEPH    . Hypothyroidism   . Cancer [facility])     Hepatic  . Chronic kidney disease     CRF  . Stroke ([facility]) 2005  . Urgency of urination   . Generalized weakness   . Risk for falls         PAST SURGICAL HISTORY:   Past Surgical History  Procedure Laterality Date  . Transthoracic echocardiogram  01/2010    EF 60%, mild LVH, mild RV dilation with normal systolic function  . Nm myoview ltd  01/2010    Lexixan myoview: EF 67%, no ischemia or infarction  . Abi  01/2006    0.93 Normal  . Anteriolisthesis  06/26/2006  . Electrocardiogram  01/02/2006    1st degree block  . Cataract extraction, bilateral      per patient  . Percutaneous tracheostomy  11/04/2012    Procedure: PERCUTANEOUS TRACHEOSTOMY;  Surgeon: Cherylynn Ridges, MD;  Location: [facility] OR;  Service: General;  Laterality: N/A;  . Radiology with anesthesia  N/A 06/08/2015    Procedure: MRI OF ABDOMEN WITH AND WITHOUT;  Surgeon: Medication Radiologist, MD;  Location: [facility] OR;  Service: Radiology;  Laterality: N/A;  DR. SHICK/MRI  . Radiology with anesthesia N/A 08/24/2015    Procedure: MRI OF ABDOMINEN WITH AND WITHOUT;  Surgeon: Medication Radiologist, MD;  Location: [facility] OR;  Service: Radiology;  Laterality: N/A;       CURRENT MEDICATIONS:  Medication list is reviewed.                 ASA 81 q.d.      Lipitor 10 q.d.   D/ced   Calcium 500/200, 1 tablet daily.     Depakote 250 in the morning and 500 at bedtime.    Ferrous sulfate 325 daily.    Synthroid 75 daily.     Lisinopril 5 daily.    Lovaza 1 g daily.    Protonix 40 q.d.      Vitamin D3, 5000 U daily.      Norco 5/325, 1 tablet in the morning.    Insulin Lantus 50 U subcu at night.    Insulin Humalog 12 U a.c. meals.    REVIEW OF SYSTEMS:    GENERAL:  The patient complains of fatigue, but no fever or chills.   HEENT: no oral pain CHEST/RESPIRATORY:  No cough.  No sputum.    CARDIAC:  No chest pain.   GI:  No nausea, vomiting, or abdominal pain.   I am not sure that he has even recognized the jaundice.           GU:  No dysuria.    MUSCULOSKELETAL:  He complains of some low back pain.    ENDOCRINE:  He has no polyuria or polydipsia.  His fasting blood sugars generally are in the low 100s range, although he has had some lower blood sugars with a 51 on 08/19/2015.  He is generally higher a.c. lunch in the low to upper 200s.  Again, at 4:30, there are variable numbers, anywhere from 82 to 382.   Neurologic: no focal complaints PSYCH/mental status: Staff report no overt confusion but some increased lethargy      PHYSICAL EXAMINATION:   GENERAL APPEARANCE:  The patient is obviously jaundiced.  However, he does not appear to be in any distress.    CHEST/RESPIRATORY:  Clear air entry bilaterally.    CARDIOVASCULAR:  CARDIAC:  Heart sounds are normal.  There are no murmurs.    JVP is not elevated.    GASTROINTESTINAL:   LIVER/SPLEEN/KIDNEYS:  I may be able to feel the right lobe of his liver.  However, there is no right upper quadrant tenderness.   His spleen is not palpable.   HERNIA:   He has a periumbilical hernia and a ventral hernia, both of which reduce easily.     GENITOURINARY:   BLADDER:  No suprapubic or costovertebral angle tenderness.     CIRCULATION:   EDEMA/VARICOSITIES:  Extremities:  He has 2+ edema, but no evidence of a DVT.   PSYCHIATRIC:   MENTAL STATUS:  He still seems fairly alert to me.  There is no asterixis.  No obvious confusion.  He is still able to stand, although he moves slowly.    ASSESSMENT/PLAN:              Jaundice.  I suspect this is progression of his underlying hepatocellular carcinoma.  He has also cirrhosis of the liver underneath all of this.  So far, he is not decompensating from a clinical or cognitive point of view.  I have put in a call to Interventional Radiology, the physician reading the report to call me back to tell me if there is anything that I need to do further.  I note that the patient is a Full Code.   I do not think this is appropriate.  If the tumor has progressed even after the interventional procedure this quickly, then I will have a discussion with him about end-of-life issues.  Of note, his bilirubin was normal at 0.4 a little over a month ago.  His alk phos was 256.    Chronic renal insufficiency.  The creatinine of 1.29 compares with 1.31 a month ago.  This does not seem to have changed.     CPT CODE: 37342       ADDENDUM:  I have spoken with Radiology at [facility].  The patient's MRI shows progression of the hypervascular tumor in his liver, compatible with multicentric hepatocellular carcinoma.  There is also what appears to be a right lower lobe lung possible metastasis, although the radiologist states that this is poorly characterized and suggested a CT scan which I have ordered.  Finally, he has intra- and  extrahepatic duct dilation without clear evidence of a stone.  The feeling was this could be related to edema and/or stricture to a recently passed stone.  An ERCP might be necessary.  My general feeling about this is that he is currently stable and his abdomen is benign.  I have asked for repeat lab work on Monday.  I do not see a need to send him out.  However, if he clinically deteriorates either with symptoms, abdominal pain, nausea or vomiting, and/or the lab work on Monday deteriorates, then an ERCP might possibly be necessary.  I am hopeful that this is not the case.

## 2015-09-03 ENCOUNTER — Ambulatory Visit (HOSPITAL_COMMUNITY): Payer: Medicare Other

## 2015-09-07 ENCOUNTER — Non-Acute Institutional Stay (SKILLED_NURSING_FACILITY): Payer: Medicare Other | Admitting: Internal Medicine

## 2015-09-07 DIAGNOSIS — K831 Obstruction of bile duct: Secondary | ICD-10-CM

## 2015-09-07 DIAGNOSIS — K838 Other specified diseases of biliary tract: Secondary | ICD-10-CM

## 2015-09-07 DIAGNOSIS — C220 Liver cell carcinoma: Secondary | ICD-10-CM

## 2015-09-07 NOTE — Progress Notes (Signed)
Patient ID: Alex Foster, male   DOB: 1945/12/07, 69 y.o.   MRN: [id number]    09/07/2015  Facility; [name] Chief complaint; follow-up jaundice History; I saw this man 2 weeks ago secondary to increasing jaundice. His total bilirubin was up to 9.9 with an alkaline phosphatase of 1928. In spite of the fact that he has underlying cirrhosis and extensive hepatocellular carcinoma his regularly scheduled MRI posthepatic artery ablation not only showed more extensive tumor in the liver but evidence of intrahepatic and extrahepatic bile duct dilation up to 19 mm. There was no obvious pancreatic mass. Distally as the bile duct approach to the ampulla it appeared to become normal caliber suggesting that he had probably attempted to pass a gallstone. There is no evidence of acute cholecystitis. Given the fact that the patient was completely asymptomatic last week without evidence of abdominal pain, fever, nausea, vomiting etc. his situation has continued to decline and he has become more deeply jaundiced although I don't see follow-up lab work. He is no longer eating,  The patient does not appear to be uncomfortable. He is on every 4 hour Roxanol when necessary. All of his medications have been discontinued. He is full comfort care and the facility. I was called about this last week by the [employer] nurse practitioner who had discussed this with the patient's brother and also the patient himself when he was capable of having an opinion    Past Medical History  Diagnosis Date  . Hyperlipidemia   . COPD (chronic obstructive pulmonary disease) ([place])     Active smoker, has oxygen for nightime use.  Cannot tolerate using mask.  . Low back pain     Goes to [facility]  . H/O alcohol abuse   . Myocardial infarction ([place])   . Hypertension   . Diabetes mellitus   . TBI (traumatic brain injury) ([place]) 05/31/2012  . Extensive facial fractures ([place]) 05/31/2012  . MRSA (methicillin resistant staphylococcus  aureus) pneumonia 06/02/2012  . Cardiac arrest ([place]) 06/02/2012  . Mobitz (type) II atrioventricular block 06/04/2012  . DVT of axillary vein, acute right 08/03/2012    Dx 06/17/2012 by Duplex   . COPD 05/08/2007    Pt continues to smoke.  He does not desire smoking cessation.  Currently he smokes about 1 1/2 ppd.     . Acute respiratory failure with hypoxia ([place]) 06/02/2012  . Tracheobronchitis, MRSA 06/02/2012  . Pleural effusion 06/09/2012  . Type II or unspecified type diabetes mellitus with unspecified complication, uncontrolled 07/21/2013  . Hepatic encephalopathy ([place]) 04/12/2011  . Leukocytosis 06/01/2012  . Lactic acidosis 06/01/2012  . Dizziness 07/18/2011  . Fall 11/04/2012  . Tracheostomy status ([place]) 11/11/2012  . Thrombocytopenia ([place]) 03/24/2011    Transient   . Multiple rib fractures 05/31/2012  . Multiple fractures of ribs of left side 11/04/2012  . OTHER DISORDERS OF BONE AND CARTILAGE OTHER 08/17/2010    Narcotics prescribed by outside clinic.     Marland Kitchen BENIGN POSITIONAL VERTIGO 08/10/2008    Pt has been complaining of dizziness or sensations that he will fall or run into a wall.  His complaints date back to 2010-2011.  His orthostatics were negative.  He refused MRI as part of work up for TIA concerns.  I have referred him to Vestibular clinic.      Marland Kitchen Dementia with behavioral disturbance 04/18/2013  . Dysphagia, unspecified(787.20) 07/21/2013  . RBBB 02/03/2010    Qualifier: Diagnosis of  By: Aundra Dubin, MD, [place]    .  HYPERTENSION, BENIGN ESSENTIAL, CONTROLLED 05/08/2007    Qualifier: Diagnosis of  By: Laverle Hobby MD, JOSEPH    . Hypothyroidism   . Cancer [facility])     Hepatic  . Chronic kidney disease     CRF  . Stroke ([place]) 2005  . Urgency of urination   . Generalized weakness   . Risk for falls     Past Surgical History  Procedure Laterality Date  . Transthoracic echocardiogram  01/2010    EF 60%, mild LVH, mild RV dilation with normal systolic function  . Nm myoview ltd  01/2010    Lexixan  myoview: EF 67%, no ischemia or infarction  . Abi  01/2006    0.93 Normal  . Anteriolisthesis  06/26/2006  . Electrocardiogram  01/02/2006    1st degree block  . Cataract extraction, bilateral      per patient  . Percutaneous tracheostomy  11/04/2012    Procedure: PERCUTANEOUS TRACHEOSTOMY;  Surgeon: Gwenyth Ober, MD;  Location: [place];  Service: General;  Laterality: N/A;  . Radiology with anesthesia N/A 06/08/2015    Procedure: MRI OF ABDOMEN WITH AND WITHOUT;  Surgeon: Medication Radiologist, MD;  Location: [place];  Service: Radiology;  Laterality: N/A;  DR. SHICK/MRI  . Radiology with anesthesia N/A 08/24/2015    Procedure: MRI OF ABDOMINEN WITH AND WITHOUT;  Surgeon: Medication Radiologist, MD;  Location: [place];  Service: Radiology;  Laterality: N/A;    Current medications All of his medications have been discontinued  Review of systems This is no longer possible due to altered mentation  Physical examination Gen. the patient is now deeply jaundiced. No longer mentating properly. Respiratory shallow but otherwise clear air entry Cardiac surprisingly he does not appear to be dehydrated Abdomen mildly distended some diffuse tenderness Mental status; marked continued deterioration from when I saw him last week  Impression/plan #1 jaundice this appears to be due to some form of distal common bile duct obstruction. UnFortunately the MRI did not really visualize the area well other than to say he did not have a pancreatic mass. No clear distal common bile duct stone was seen although they could not exactly exclude 1. The suggestion was to proceed with an ERCP however I was hoping to see this improvement if the patient was indeed passing a small stone not seen on the MRI. He appears to be more deeply jaundiced at least visually than last week. Unfortunately the lab work that I ordered for yesterday was not done I'll try to repeat this stat today and then will need to go forward with making some  decisions about how to proceed here. #2 extensive hepatocellular carcinoma which by the MRI appears to have progressed . He also has baseline cirrhosis of the liver and probably now was in liver failure with hepatic encephalopathy  The patient is now purely comfort care; this appears reasonable. His oral intake according to the staff member that was in the room when I saw him today. He has comfort care measures in the facility all of this is been properly addressed

## 2015-09-08 ENCOUNTER — Other Ambulatory Visit: Payer: Self-pay | Admitting: *Deleted

## 2015-09-08 MED ORDER — MORPHINE SULFATE (CONCENTRATE) 10 MG/0.5ML PO SOLN: ORAL | Status: AC

## 2015-09-08 NOTE — Telephone Encounter (Signed)
[facility] 

## 2015-09-09 ENCOUNTER — Ambulatory Visit (HOSPITAL_COMMUNITY): Admission: RE | Admit: 2015-09-09 | Payer: Medicare Other | Source: Ambulatory Visit

## 2015-09-15 ENCOUNTER — Other Ambulatory Visit: Payer: Medicare Other

## 2015-09-22 NOTE — Anesthesia Postprocedure Evaluation (Signed)
Anesthesia Post Note  Patient: Alex Foster  Procedure(s) Performed: Procedure(s) (LRB): MRI OF ABDOMINEN WITH AND WITHOUT (N/A)  Patient location during evaluation: ICU Anesthesia Type: General Level of consciousness: awake Pain management: pain level controlled Vital Signs Assessment: post-procedure vital signs reviewed and stable Respiratory status: spontaneous breathing Cardiovascular status: stable Anesthetic complications: no    Last Vitals:  Filed Vitals:   08/24/15 1215 08/24/15 1220  BP: 113/78   Pulse: 94   Temp:  36.6 C  Resp: 18     Last Pain: There were no vitals filed for this visit.               EDWARDS,Rogerick Baldwin

## 2015-10-03 DEATH — deceased

## 2015-10-22 ENCOUNTER — Encounter: Payer: Self-pay | Admitting: Vascular Surgery

## 2015-10-25 ENCOUNTER — Encounter: Payer: Self-pay | Admitting: Vascular Surgery

## 2015-11-02 ENCOUNTER — Ambulatory Visit: Payer: Self-pay | Admitting: Vascular Surgery

## 2015-11-02 ENCOUNTER — Inpatient Hospital Stay (HOSPITAL_COMMUNITY): Admission: RE | Admit: 2015-11-02 | Payer: Self-pay | Source: Ambulatory Visit

## 2015-11-03 ENCOUNTER — Telehealth: Payer: Self-pay | Admitting: Vascular Surgery

## 2015-11-03 NOTE — Telephone Encounter (Signed)
Donnald Garre called on 945 am 11-21-15 stating pt passed away on 27-Sep-2015. He had an appointment on 11/02/2015 with Dr. Donnetta Hutching.  Tammi Klippel works with [employer], [phone], and stated Kelli Churn. called yesterday to r/s patient's appointment, but he is decease.
